# Patient Record
Sex: Female | Born: 1955
Health system: Southern US, Community
[De-identification: ages and names within clinical notes are randomized; demographics above are authoritative.]

## PROBLEM LIST (undated history)

## (undated) DIAGNOSIS — I10 Essential (primary) hypertension: Secondary | ICD-10-CM

## (undated) DIAGNOSIS — I219 Acute myocardial infarction, unspecified: Secondary | ICD-10-CM

## (undated) DIAGNOSIS — E785 Hyperlipidemia, unspecified: Secondary | ICD-10-CM

## (undated) DIAGNOSIS — I2510 Atherosclerotic heart disease of native coronary artery without angina pectoris: Principal | ICD-10-CM

## (undated) DIAGNOSIS — C801 Malignant (primary) neoplasm, unspecified: Secondary | ICD-10-CM

## (undated) DIAGNOSIS — F419 Anxiety disorder, unspecified: Secondary | ICD-10-CM

## (undated) DIAGNOSIS — Z87442 Personal history of urinary calculi: Secondary | ICD-10-CM

## (undated) DIAGNOSIS — F329 Major depressive disorder, single episode, unspecified: Secondary | ICD-10-CM

## (undated) DIAGNOSIS — M797 Fibromyalgia: Secondary | ICD-10-CM

## (undated) DIAGNOSIS — Z9289 Personal history of other medical treatment: Secondary | ICD-10-CM

## (undated) DIAGNOSIS — R002 Palpitations: Secondary | ICD-10-CM

## (undated) DIAGNOSIS — F32A Depression, unspecified: Secondary | ICD-10-CM

## (undated) DIAGNOSIS — G4730 Sleep apnea, unspecified: Secondary | ICD-10-CM

## (undated) DIAGNOSIS — I214 Non-ST elevation (NSTEMI) myocardial infarction: Secondary | ICD-10-CM

## (undated) DIAGNOSIS — J8410 Pulmonary fibrosis, unspecified: Secondary | ICD-10-CM

## (undated) DIAGNOSIS — M1990 Unspecified osteoarthritis, unspecified site: Secondary | ICD-10-CM

## (undated) DIAGNOSIS — E039 Hypothyroidism, unspecified: Secondary | ICD-10-CM

## (undated) DIAGNOSIS — K219 Gastro-esophageal reflux disease without esophagitis: Secondary | ICD-10-CM

## (undated) HISTORY — PX: CARDIAC CATHETERIZATION: SHX172

## (undated) HISTORY — DX: Hyperlipidemia, unspecified: E78.5

## (undated) HISTORY — PX: FOOT BONE EXCISION: SUR493

## (undated) HISTORY — DX: Essential (primary) hypertension: I10

## (undated) HISTORY — PX: TUBAL LIGATION: SHX77

## (undated) HISTORY — PX: CARPAL TUNNEL RELEASE: SHX101

---

## 2001-11-28 ENCOUNTER — Inpatient Hospital Stay (HOSPITAL_COMMUNITY): Admission: EM | Admit: 2001-11-28 | Discharge: 2001-11-30 | Payer: Self-pay | Admitting: Internal Medicine

## 2001-11-28 ENCOUNTER — Encounter: Payer: Self-pay | Admitting: Internal Medicine

## 2001-11-30 ENCOUNTER — Encounter: Payer: Self-pay | Admitting: Cardiology

## 2001-12-29 ENCOUNTER — Other Ambulatory Visit: Admission: RE | Admit: 2001-12-29 | Discharge: 2001-12-29 | Payer: Self-pay | Admitting: Obstetrics and Gynecology

## 2001-12-31 ENCOUNTER — Encounter: Payer: Self-pay | Admitting: Obstetrics and Gynecology

## 2001-12-31 ENCOUNTER — Ambulatory Visit (HOSPITAL_COMMUNITY): Admission: RE | Admit: 2001-12-31 | Discharge: 2001-12-31 | Payer: Self-pay | Admitting: Obstetrics and Gynecology

## 2002-01-24 ENCOUNTER — Encounter (INDEPENDENT_AMBULATORY_CARE_PROVIDER_SITE_OTHER): Payer: Self-pay

## 2002-01-24 ENCOUNTER — Ambulatory Visit (HOSPITAL_COMMUNITY): Admission: RE | Admit: 2002-01-24 | Discharge: 2002-01-24 | Payer: Self-pay | Admitting: Obstetrics and Gynecology

## 2002-07-20 ENCOUNTER — Encounter: Payer: Self-pay | Admitting: Emergency Medicine

## 2002-07-20 ENCOUNTER — Emergency Department (HOSPITAL_COMMUNITY): Admission: EM | Admit: 2002-07-20 | Discharge: 2002-07-20 | Payer: Self-pay | Admitting: Emergency Medicine

## 2003-06-25 ENCOUNTER — Emergency Department (HOSPITAL_COMMUNITY): Admission: EM | Admit: 2003-06-25 | Discharge: 2003-06-25 | Payer: Self-pay | Admitting: Emergency Medicine

## 2003-08-05 DIAGNOSIS — I214 Non-ST elevation (NSTEMI) myocardial infarction: Secondary | ICD-10-CM

## 2003-08-05 DIAGNOSIS — Z9289 Personal history of other medical treatment: Secondary | ICD-10-CM

## 2003-08-05 HISTORY — DX: Non-ST elevation (NSTEMI) myocardial infarction: I21.4

## 2003-08-05 HISTORY — DX: Personal history of other medical treatment: Z92.89

## 2003-08-05 HISTORY — PX: CORONARY ANGIOPLASTY WITH STENT PLACEMENT: SHX49

## 2003-10-27 ENCOUNTER — Inpatient Hospital Stay (HOSPITAL_COMMUNITY): Admission: EM | Admit: 2003-10-27 | Discharge: 2003-11-01 | Payer: Self-pay | Admitting: Emergency Medicine

## 2004-01-31 ENCOUNTER — Inpatient Hospital Stay (HOSPITAL_COMMUNITY): Admission: EM | Admit: 2004-01-31 | Discharge: 2004-02-07 | Payer: Self-pay | Admitting: Cardiology

## 2004-02-07 ENCOUNTER — Encounter: Payer: Self-pay | Admitting: Cardiology

## 2004-04-11 ENCOUNTER — Inpatient Hospital Stay (HOSPITAL_COMMUNITY): Admission: EM | Admit: 2004-04-11 | Discharge: 2004-04-13 | Payer: Self-pay | Admitting: Internal Medicine

## 2004-08-19 ENCOUNTER — Ambulatory Visit: Payer: Self-pay | Admitting: Cardiology

## 2004-10-15 ENCOUNTER — Ambulatory Visit: Payer: Self-pay | Admitting: Cardiology

## 2004-12-23 ENCOUNTER — Ambulatory Visit: Payer: Self-pay | Admitting: Cardiology

## 2004-12-23 ENCOUNTER — Inpatient Hospital Stay (HOSPITAL_COMMUNITY): Admission: AD | Admit: 2004-12-23 | Discharge: 2004-12-25 | Payer: Self-pay | Admitting: Cardiology

## 2004-12-23 ENCOUNTER — Ambulatory Visit: Payer: Self-pay | Admitting: *Deleted

## 2005-01-16 ENCOUNTER — Ambulatory Visit: Payer: Self-pay | Admitting: Cardiology

## 2005-04-22 ENCOUNTER — Ambulatory Visit: Payer: Self-pay | Admitting: *Deleted

## 2005-04-23 ENCOUNTER — Inpatient Hospital Stay (HOSPITAL_COMMUNITY): Admission: AD | Admit: 2005-04-23 | Discharge: 2005-04-25 | Payer: Self-pay | Admitting: Internal Medicine

## 2005-04-23 ENCOUNTER — Encounter: Payer: Self-pay | Admitting: *Deleted

## 2005-04-24 ENCOUNTER — Ambulatory Visit: Payer: Self-pay | Admitting: Cardiology

## 2005-08-26 ENCOUNTER — Ambulatory Visit: Payer: Self-pay | Admitting: Cardiology

## 2005-08-28 ENCOUNTER — Ambulatory Visit (HOSPITAL_COMMUNITY): Admission: RE | Admit: 2005-08-28 | Discharge: 2005-08-28 | Payer: Self-pay | Admitting: Family Medicine

## 2005-09-03 ENCOUNTER — Emergency Department (HOSPITAL_COMMUNITY): Admission: EM | Admit: 2005-09-03 | Discharge: 2005-09-03 | Payer: Self-pay | Admitting: Emergency Medicine

## 2005-09-06 ENCOUNTER — Emergency Department (HOSPITAL_COMMUNITY): Admission: EM | Admit: 2005-09-06 | Discharge: 2005-09-06 | Payer: Self-pay | Admitting: Emergency Medicine

## 2005-12-03 ENCOUNTER — Ambulatory Visit (HOSPITAL_COMMUNITY): Admission: RE | Admit: 2005-12-03 | Discharge: 2005-12-03 | Payer: Self-pay | Admitting: Family Medicine

## 2006-01-02 ENCOUNTER — Ambulatory Visit (HOSPITAL_COMMUNITY): Admission: RE | Admit: 2006-01-02 | Discharge: 2006-01-02 | Payer: Self-pay | Admitting: Internal Medicine

## 2006-01-02 ENCOUNTER — Ambulatory Visit: Payer: Self-pay | Admitting: Internal Medicine

## 2006-01-07 ENCOUNTER — Ambulatory Visit: Payer: Self-pay | Admitting: Orthopedic Surgery

## 2006-01-16 ENCOUNTER — Ambulatory Visit (HOSPITAL_COMMUNITY): Admission: RE | Admit: 2006-01-16 | Discharge: 2006-01-16 | Payer: Self-pay | Admitting: Orthopedic Surgery

## 2006-01-19 ENCOUNTER — Ambulatory Visit: Payer: Self-pay | Admitting: Orthopedic Surgery

## 2006-01-28 ENCOUNTER — Ambulatory Visit: Payer: Self-pay | Admitting: Orthopedic Surgery

## 2006-02-18 ENCOUNTER — Ambulatory Visit: Payer: Self-pay | Admitting: Orthopedic Surgery

## 2006-04-23 ENCOUNTER — Observation Stay (HOSPITAL_COMMUNITY): Admission: EM | Admit: 2006-04-23 | Discharge: 2006-04-23 | Payer: Self-pay | Admitting: Cardiology

## 2006-04-23 ENCOUNTER — Ambulatory Visit: Payer: Self-pay | Admitting: *Deleted

## 2006-05-11 ENCOUNTER — Ambulatory Visit: Payer: Self-pay | Admitting: Cardiology

## 2006-10-24 ENCOUNTER — Emergency Department (HOSPITAL_COMMUNITY): Admission: EM | Admit: 2006-10-24 | Discharge: 2006-10-24 | Payer: Self-pay | Admitting: Emergency Medicine

## 2006-11-25 ENCOUNTER — Ambulatory Visit (HOSPITAL_COMMUNITY): Admission: RE | Admit: 2006-11-25 | Discharge: 2006-11-25 | Payer: Self-pay | Admitting: Family Medicine

## 2006-11-30 ENCOUNTER — Ambulatory Visit: Admission: RE | Admit: 2006-11-30 | Discharge: 2006-11-30 | Payer: Self-pay | Admitting: Family Medicine

## 2006-12-10 ENCOUNTER — Ambulatory Visit: Payer: Self-pay | Admitting: Pulmonary Disease

## 2006-12-24 ENCOUNTER — Ambulatory Visit: Payer: Self-pay | Admitting: Physician Assistant

## 2007-02-02 ENCOUNTER — Ambulatory Visit (HOSPITAL_COMMUNITY): Admission: RE | Admit: 2007-02-02 | Discharge: 2007-02-02 | Payer: Self-pay | Admitting: Family Medicine

## 2007-03-03 ENCOUNTER — Ambulatory Visit: Payer: Self-pay | Admitting: Physician Assistant

## 2007-03-09 ENCOUNTER — Ambulatory Visit (HOSPITAL_COMMUNITY): Admission: RE | Admit: 2007-03-09 | Discharge: 2007-03-09 | Payer: Self-pay | Admitting: Family Medicine

## 2007-05-19 ENCOUNTER — Emergency Department (HOSPITAL_COMMUNITY): Admission: EM | Admit: 2007-05-19 | Discharge: 2007-05-19 | Payer: Self-pay | Admitting: Emergency Medicine

## 2007-08-06 ENCOUNTER — Ambulatory Visit: Payer: Self-pay | Admitting: Cardiology

## 2007-08-06 ENCOUNTER — Encounter: Payer: Self-pay | Admitting: Cardiology

## 2007-08-07 ENCOUNTER — Encounter: Payer: Self-pay | Admitting: Cardiology

## 2007-08-08 ENCOUNTER — Inpatient Hospital Stay (HOSPITAL_COMMUNITY): Admission: AD | Admit: 2007-08-08 | Discharge: 2007-08-10 | Payer: Self-pay | Admitting: Cardiology

## 2007-08-08 ENCOUNTER — Ambulatory Visit: Payer: Self-pay | Admitting: Cardiology

## 2007-11-02 ENCOUNTER — Ambulatory Visit (HOSPITAL_BASED_OUTPATIENT_CLINIC_OR_DEPARTMENT_OTHER): Admission: RE | Admit: 2007-11-02 | Discharge: 2007-11-02 | Payer: Self-pay | Admitting: Orthopedic Surgery

## 2007-12-21 ENCOUNTER — Ambulatory Visit: Payer: Self-pay | Admitting: Cardiology

## 2008-01-11 ENCOUNTER — Encounter: Payer: Self-pay | Admitting: Cardiology

## 2008-02-08 ENCOUNTER — Emergency Department (HOSPITAL_COMMUNITY): Admission: EM | Admit: 2008-02-08 | Discharge: 2008-02-09 | Payer: Self-pay | Admitting: Emergency Medicine

## 2008-02-18 ENCOUNTER — Ambulatory Visit (HOSPITAL_COMMUNITY): Admission: RE | Admit: 2008-02-18 | Discharge: 2008-02-18 | Payer: Self-pay | Admitting: Family Medicine

## 2008-09-07 ENCOUNTER — Emergency Department (HOSPITAL_COMMUNITY): Admission: EM | Admit: 2008-09-07 | Discharge: 2008-09-07 | Payer: Self-pay | Admitting: Emergency Medicine

## 2009-01-19 ENCOUNTER — Encounter: Payer: Self-pay | Admitting: Cardiology

## 2009-03-08 ENCOUNTER — Ambulatory Visit: Payer: Self-pay | Admitting: Cardiology

## 2009-03-08 ENCOUNTER — Encounter: Payer: Self-pay | Admitting: Cardiology

## 2009-03-08 DIAGNOSIS — E039 Hypothyroidism, unspecified: Secondary | ICD-10-CM | POA: Insufficient documentation

## 2009-03-08 DIAGNOSIS — E782 Mixed hyperlipidemia: Secondary | ICD-10-CM | POA: Insufficient documentation

## 2009-03-08 DIAGNOSIS — I2510 Atherosclerotic heart disease of native coronary artery without angina pectoris: Secondary | ICD-10-CM | POA: Insufficient documentation

## 2009-06-04 ENCOUNTER — Encounter (INDEPENDENT_AMBULATORY_CARE_PROVIDER_SITE_OTHER): Payer: Self-pay | Admitting: *Deleted

## 2009-07-04 ENCOUNTER — Encounter: Payer: Self-pay | Admitting: Cardiology

## 2009-07-05 ENCOUNTER — Encounter (INDEPENDENT_AMBULATORY_CARE_PROVIDER_SITE_OTHER): Payer: Self-pay | Admitting: *Deleted

## 2009-07-26 ENCOUNTER — Telehealth (INDEPENDENT_AMBULATORY_CARE_PROVIDER_SITE_OTHER): Payer: Self-pay | Admitting: *Deleted

## 2009-09-20 DIAGNOSIS — Z87891 Personal history of nicotine dependence: Secondary | ICD-10-CM | POA: Insufficient documentation

## 2009-09-20 DIAGNOSIS — I214 Non-ST elevation (NSTEMI) myocardial infarction: Secondary | ICD-10-CM | POA: Insufficient documentation

## 2009-09-20 DIAGNOSIS — R072 Precordial pain: Secondary | ICD-10-CM | POA: Insufficient documentation

## 2009-09-20 DIAGNOSIS — E785 Hyperlipidemia, unspecified: Secondary | ICD-10-CM | POA: Insufficient documentation

## 2009-09-20 DIAGNOSIS — Z9861 Coronary angioplasty status: Secondary | ICD-10-CM | POA: Insufficient documentation

## 2009-11-02 ENCOUNTER — Encounter: Payer: Self-pay | Admitting: Cardiology

## 2010-02-08 ENCOUNTER — Ambulatory Visit: Payer: Self-pay | Admitting: Cardiology

## 2010-02-08 DIAGNOSIS — I10 Essential (primary) hypertension: Secondary | ICD-10-CM | POA: Insufficient documentation

## 2010-02-08 DIAGNOSIS — F17200 Nicotine dependence, unspecified, uncomplicated: Secondary | ICD-10-CM | POA: Insufficient documentation

## 2010-08-09 ENCOUNTER — Telehealth (INDEPENDENT_AMBULATORY_CARE_PROVIDER_SITE_OTHER): Payer: Self-pay | Admitting: *Deleted

## 2010-08-12 ENCOUNTER — Ambulatory Visit
Admission: RE | Admit: 2010-08-12 | Discharge: 2010-08-12 | Payer: Self-pay | Source: Home / Self Care | Attending: Cardiology | Admitting: Cardiology

## 2010-08-12 ENCOUNTER — Encounter: Payer: Self-pay | Admitting: Cardiology

## 2010-08-25 ENCOUNTER — Encounter: Payer: Self-pay | Admitting: Family Medicine

## 2010-09-03 NOTE — Assessment & Plan Note (Signed)
Summary: 1 YR FU   Visit Type:  Follow-up Primary Provider:  Dr. [name]   History of Present Illness: 55 year old woman presents for followup. She was last seen in the office back in May 2009. Interval history is reviewed including a repeat cardiac catheterization from August of last year demonstrating patent stents and otherwise nonobstructive disease.  From a symptom perspective, [name] describes occasional chest pain, although not typically with exertion, and not progressive. She states that she remains active, taking care of grandchildren, and working in her garden.  Followup labs in April of this year by Dr. Gerda Diss showed BUN 12, creatinine 0.8, AST 21, ALT 23, cholesterol 132, HDL 47, LDL 71, triglycerides 68. We discussed these.  Today we talked about smoking cessation. She has been able to cut back cigarettes, but not quit.  Preventive Screening-Counseling & Management  Alcohol-Tobacco     Smoking Status: current     Smoking Cessation Counseling: yes     Packs/Day: <1/3 PPD  Current Medications (verified): 1)  Imdur 30 Mg Xr24h-Tab (Isosorbide Mononitrate) .... Take 1/2 Tablet By Mouth Once A Day 2)  Metoprolol Tartrate 50 Mg Tabs (Metoprolol Tartrate) .... Take 1/2 Tablet By Mouth Twice A Day 3)  Plavix 75 Mg Tabs (Clopidogrel Bisulfate) .... Take 1 Tablet By Mouth Once A Day 4)  Omeprazole 20 Mg Cpdr (Omeprazole) .... Take 1 Tablet By Mouth Once A Day 5)  Nitroglycerin 0.4 Mg Subl (Nitroglycerin) .... One Tablet Under Tongue Every 5 Minutes As Needed For Chest Pain---May Repeat Times Three 6)  Aspir-Low 81 Mg Tbec (Aspirin) .... Take 1 Tablet By Mouth Once A Day 7)  Lipitor 80 Mg Tabs (Atorvastatin Calcium) .... Take One Tablet By Mouth Daily. 8)  Prozac 40 Mg Caps (Fluoxetine Hcl) .... Take 1 Capsule By Mouth Once A Day 9)  Synthroid 125 Mcg Tabs (Levothyroxine Sodium) .... Take 1 Tablet By Mouth Once A Day 10)  Alprazolam 1 Mg Tabs (Alprazolam) .... Take 1  Tablet By Mouth Four Times A Day 11)  Hydrocodone-Acetaminophen 10-500 Mg Tabs (Hydrocodone-Acetaminophen) .... Take 1/2-1 Tablet By Mouth Two Times A Day As Needed  Allergies (verified): No Known Drug Allergies  Comments:  Nurse/Medical Assistant: The patient's medication bottles and allergies were reviewed with the patient and were updated in the Medication and Allergy Lists.  Past History:  Social History: Last updated: 02/08/2010 Disabled  Divorced  Tobacco Use - Former Alcohol Use - no Drug Use - no  Past Medical History: CAD - nonobstructive circ/RCA, DES LAD/diagonal 2005, LVEF 60% G E R D Hyperlipidemia Hypertension Hypothyroidism Myocardial Infarction - NSTEMI 2009 Fibromyalgia Depression - h/o suicide attempt  Social History: Disabled  Divorced  Tobacco Use - Former Alcohol Use - no Drug Use - no Smoking Status:  current Packs/Day:  <1/3 PPD  Review of Systems       The patient complains of peripheral edema.  The patient denies anorexia, fever, syncope, dyspnea on exertion, prolonged cough, hemoptysis, melena, hematochezia, and severe indigestion/heartburn.         Otherwise reviewed and negative except as outlined.  Clinical Review Panels:  Cardiac Imaging Cardiac Cath Findings  ANGIOGRAPHIC DATA.:   1. The left main coronary artery is free of critical disease.   2. The LAD courses to the apex.  Importantly, the LAD divides, and       there are two small thin branches which constitute the LAD.  One       arises from the  purported diagonal and the other one from the true       LAD which occupies to some extent a septal perforating location.       There is a bifurcation stent at the takeoff of the large diagonal,       and the LAD despite the bifurcation stent there does not appear to       be significant narrowing.  The stents themselves appear to be       widely patent.  One branch provides the LAD proper which is small       and has a small  septal perforator which has some ostial pinching at       its takeoff from the stent site, but it is relatively small.  A       large diagonal itself has a LAD portion, and supplies another side       branch, all of which are free of critical disease.   3. The circumflex has a first marginal that has 30% proximal       narrowing.  It does not appear to be high-grade.  The remainder of       the circumflex provides two large marginal branches which are free       of critical disease.  There is perhaps 30% narrowing in the mid       portion of the AV circumflex and mild luminal irregularity       thereafter, but no critical focal stenoses.   4. The right coronary artery is a large-caliber vessel.  There is       about 20-30% ostial narrowing and 20% mid-narrowing but no evidence       of high-grade stenosis.      Ventriculography was done in the RAO projection.  There was really only   one good ventricular beat, but overall LV function appeared to be   preserved.      CONCLUSIONS:   1. Preserved overall LV function without segmental wall motion       abnormality that is definable.   2. Continued patency of the bifurcation stents and patency of the       other coronary vessels.  The patient will likely need medical       therapy. (03/08/2009)    Vital Signs:  Patient profile:   55 year old female Height:      66 inches Weight:      208 pounds BMI:     33.69 Pulse rate:   52 / minute BP sitting:   109 / 74  (left arm) Cuff size:   large  Vitals Entered By: Carlye Grippe (February 08, 2010 10:09 AM)  Nutrition Counseling: Patient's BMI is greater than 25 and therefore counseled on weight management options.  Physical Exam  Additional Exam:  Comfortable in no acute distress. HEENT: Conjuctivae and lids normal, oropharynx clear with moist mucosa. Neck: Supple, no elevated JVP, no loud carotid bruits, no thyromegaly or tenderness. Lungs: Nonlabored breathing at rest. CTA without  rales or wheezes. Cor: PMI nondisplaced. RRR, normal S1/S2. No pathologic systolic murmurs. No S3 or rub. Abd: Soft, NTND.  No HSM. No bruits. Normoactive bowel sounds. Ext: No CCE. Distal pulses 2+. MS: No kyphosis noted. Skin: Large tattoo on back. Neuropsych: A&Ox3, affect grossly normal.    EKG  Procedure date:  02/08/2010  Findings:      Sinus bradycardia at 48 beats per minute, otherwise normal.  Impression & Recommendations:  Problem # 1:  CORONARY ATHEROSCLEROSIS NATIVE CORONARY ARTERY (ICD-414.01)  Symptomatically stable on present medical regimen. Electrocardiogram is also stable. She continues to follow regularly with Dr.Luking, and we will plan an annual followup, sooner if needed.  Her updated medication list for this problem includes:    Imdur 30 Mg Xr24h-tab (Isosorbide mononitrate) .Marland Kitchen... Take 1/2 tablet by mouth once a day    Metoprolol Tartrate 50 Mg Tabs (Metoprolol tartrate) .Marland Kitchen... Take 1/2 tablet by mouth twice a day    Plavix 75 Mg Tabs (Clopidogrel bisulfate) .Marland Kitchen... Take 1 tablet by mouth once a day    Nitroglycerin 0.4 Mg Subl (Nitroglycerin) ..... One tablet under tongue every 5 minutes as needed for chest pain---may repeat times three    Aspir-low 81 Mg Tbec (Aspirin) .Marland Kitchen... Take 1 tablet by mouth once a day  Orders: EKG w/ Interpretation (93000)  Problem # 2:  HYPERLIPIDEMIA (ICD-272.4)  Well-controlled based on labs from April.  Her updated medication list for this problem includes:    Lipitor 80 Mg Tabs (Atorvastatin calcium) .Marland Kitchen... Take one tablet by mouth daily.  Problem # 3:  TOBACCO ABUSE (ICD-305.1)  We discussed smoking cessation today.  Problem # 4:  ESSENTIAL HYPERTENSION, BENIGN (ICD-401.1)  Blood pressure well controlled today.  Her updated medication list for this problem includes:    Metoprolol Tartrate 50 Mg Tabs (Metoprolol tartrate) .Marland Kitchen... Take 1/2 tablet by mouth twice a day    Aspir-low 81 Mg Tbec (Aspirin) .Marland Kitchen... Take 1 tablet  by mouth once a day  Patient Instructions: 1)  Your physician wants you to follow-up in: 1 year. You will receive a reminder letter in the mail one-two months in advance. If you don't receive a letter, please call our office to schedule the follow-up appointment. 2)  Your physician recommends that you continue on your current medications as directed. Please refer to the Current Medication list given to you today. Prescriptions: PLAVIX 75 MG TABS (CLOPIDOGREL BISULFATE) Take 1 tablet by mouth once a day  #30 x 12   Entered by:   Cyril Loosen, RN, BSN   Authorized by:   Loreli Slot, MD, [facility]   Signed by:   Cyril Loosen, RN, BSN on 02/08/2010   Method used:   Electronically to        [facility]* (retail)       [address]       [place], [place]  [address]       Ph: [phone]       Fax: [phone]   RxID:   [id number] IMDUR 30 MG XR24H-TAB (ISOSORBIDE MONONITRATE) Take 1/2 tablet by mouth once a day  #15 x 12   Entered by:   Cyril Loosen, RN, BSN   Authorized by:   Loreli Slot, MD, [facility]   Signed by:   Cyril Loosen, RN, BSN on 02/08/2010   Method used:   Electronically to        [facility]* (retail)       [address]       [place], [place]  [address]       Ph: [phone]       Fax: [phone]   RxID:   [id number]

## 2010-09-03 NOTE — Letter (Signed)
Summary: Engineer, materials at [facility]  [address]   [place], [place] [address]   Phone: [phone]  Fax: [phone]        July 05, 2009 MRN: [id number]    [name] [address] [place], [place]  [address]    Dear [name],  Your test ordered by Selena Batten has been reviewed by your physician (or physician assistant) and was found to be normal or stable. Your physician (or physician assistant) felt no changes were needed at this time.  ____ Echocardiogram  ____ Cardiac Stress Test  __X__ Lab Work-Per Dr. Diona Browner, Lipids look good.  LFT's (Liver) OK.  Please forward TSH to primary MD.-PLEASE FOLLOW UP WITH YOUR PRIMARY MD REGARDING YOUR THYROID HORMONE LEVEL.  ____ Peripheral vascular study of arms, legs or neck  ____ CT scan or X-ray  ____ Lung or Breathing test  ____ Other:   Thank you.   Cyril Loosen, RN, BSN    Duane Boston, M.D., F.A.C.C. Thressa Sheller, M.D., F.A.C.C. Oneal Grout, M.D., F.A.C.C. Cheree Ditto, M.D., F.A.C.C. Daiva Nakayama, M.D., F.A.C.C. Kenney Houseman, M.D., F.A.C.C. Jeanne Ivan, PA-C

## 2010-09-03 NOTE — Miscellaneous (Signed)
Summary: rx - ntg spray  Clinical Lists Changes  Medications: Added new medication of NITROLINGUAL 0.4 MG/SPRAY SOLN (NITROGLYCERIN) one spray under tongue every 5 minutes as needed for severe chest pain, not to exceed 3 sprays in 15 min time frame - Signed Rx of NITROLINGUAL 0.4 MG/SPRAY SOLN (NITROGLYCERIN) one spray under tongue every 5 minutes as needed for severe chest pain, not to exceed 3 sprays in 15 min time frame;  #1 x 2;  Signed;  Entered by: Hoover Brunette, LPN;  Authorized by: Loreli Slot, MD, [facility];  Method used: Electronically to [facility] [address] [place], [place], [place]  [address], Ph: [phone], Fax: [phone]    Prescriptions: NITROLINGUAL 0.4 MG/SPRAY SOLN (NITROGLYCERIN) one spray under tongue every 5 minutes as needed for severe chest pain, not to exceed 3 sprays in 15 min time frame  #1 x 2   Entered by:   Hoover Brunette, LPN   Authorized by:   Loreli Slot, MD, [facility]   Signed by:   Hoover Brunette, LPN on 01/77/9390   Method used:   Electronically to        [facility]* (retail)       [address]       [place], [place]  [address]       Ph: [phone]       Fax: [phone]   RxID:   [id number]

## 2010-09-03 NOTE — Progress Notes (Signed)
Summary: Chest pain  Phone Note Call from Patient Call back at Home Phone [phone]   Summary of Call: Spoke with patient via phone today. She states she woke up with chest pain/pressure last night. She took one NTG and drifted back to sleep. She woke up again and still had pain so she took another NTG and again drifted off to sleep. She states when she woke up pain was gone, other than a "NTG headache." Pt denies SOB or other associated symptoms. Pt states she feels fine today but wanted [place] to be aware of her chest pain. She has a reminder in for February. Appt scheduled for September 20, 2009. Pt is aware to go to the ER for worsening or continued chest pain. She is also aware to contact office if she has further problems before 2/17 office visit. Pt verbalized understanding.  Initial call taken by: Cyril Loosen, RN, BSN,  July 26, 2009 10:48 AM

## 2010-09-03 NOTE — Assessment & Plan Note (Signed)
Summary: [facility]   Visit Type:  Follow-up Primary [name]:  [name], MD   History of Present Illness: [name] returns for a followup visit, having missed her last 6 month visit with [place]. From a symptom perspective, she reports doing very well without any significant angina or breathlessness beyond NYHA class II. She reports tolerating her medicines well, and has not required any sublingual nitroglycerin. She still enjoys riding her motorcycle, and states she's been walking with her dogs later in the evenings when it is cooler outside. She is due for followup labs. She reports no hospitalizations since I last saw her.  Preventive Screening-Counseling & Management  Alcohol-Tobacco     Smoking Status: quit      Drug Use:  no.    Current Medications (verified): 1)  Imdur 30 Mg Xr24h-Tab (Isosorbide Mononitrate) .... Take 1/2 Tablet By Mouth Once A Day 2)  Metoprolol Tartrate 50 Mg Tabs (Metoprolol Tartrate) .... Take 1/2 Tablet By Mouth Twice A Day 3)  Plavix 75 Mg Tabs (Clopidogrel Bisulfate) .... Take 1 Tablet By Mouth Once A Day 4)  Prilosec 40 Mg Cpdr (Omeprazole) .... Take 1 Capsule By Mouth Once A Day 5)  Nitroglycerin 0.4 Mg Subl (Nitroglycerin) .... One Tablet Under Tongue Every 5 Minutes As Needed For Chest Pain---May Repeat Times Three 6)  Aspirin Ec 325 Mg Tbec (Aspirin) .... Take One Tablet By Mouth Daily 7)  Lipitor 80 Mg Tabs (Atorvastatin Calcium) .... Take One Tablet By Mouth Daily. 8)  Prozac 40 Mg Caps (Fluoxetine Hcl) .... Take 1 Capsule By Mouth Once A Day 9)  Synthroid 125 Mcg Tabs (Levothyroxine Sodium) .... Take 1 Tablet By Mouth Once A Day 10)  Xanax 0.25 Mg Tabs (Alprazolam) .... As Needed 11)  Hydrocodone-Acetaminophen 5-500 Mg Tabs (Hydrocodone-Acetaminophen) .... As Needed  Allergies (verified): No Known Drug Allergies  Past History:  Past Medical History: CAD G E R D Hyperlipidemia Hypertension Hypothyroidism Myocardial  Infarction Fibromyalgia History of depression  Family History: Father: myocardial infarction at age 33 Mother: deceased age 76 with an intestinal obstruction Siblings: one brother with history of congenital heart disease  Social History: Disabled  Divorced  Tobacco Use - Former.  Alcohol Use - no Drug Use - no Smoking Status:  quit Drug Use:  no  Review of Systems  The patient denies fever, weight loss, chest pain, syncope, peripheral edema, melena, and hematochezia.         She has occasional, very brief palpitations. No dizziness. Otherwise systems reviewed and negative.  Vital Signs:  Patient profile:   55 year old female Weight:      220 pounds Pulse rate:   51 / minute BP sitting:   93 / 64  (left arm)  Physical Exam  Additional Exam:  Comfortable in no acute distress. HEENT: Conjuctivae and lids normal, oropharynx clear with moist mucosa. Neck: Supple, no elevated JVP, no loud carotid bruits, no thyromegaly or tenderness. Lungs: Nonlabored breathing at rest. CTA without rales or wheezes. Cor: PMI nondisplaced. RRR, normal S1/S2. No pathologic systolic murmurs. No S3 or rub. Abd: Soft, NTND.  No HSM. No bruits. Normoactive bowel sounds. Ext: No CCE. Distal pulses 2+. MS: No kyphosis noted. Skin: Large tattoo on back. Neuropsych: A&Ox3, affect grossly normal.    EKG  Procedure date:  03/08/2009  Findings:      Sinus bradycardia at 52 beats per minute.  Cardiac Cath  Procedure date:  03/08/2009  Findings:  ANGIOGRAPHIC DATA.:   1. The left main coronary artery is free of critical disease.   2. The LAD courses to the apex.  Importantly, the LAD divides, and       there are two small thin branches which constitute the LAD.  One       arises from the purported diagonal and the other one from the true       LAD which occupies to some extent a septal perforating location.       There is a bifurcation stent at the takeoff of the large diagonal,        and the LAD despite the bifurcation stent there does not appear to       be significant narrowing.  The stents themselves appear to be       widely patent.  One branch provides the LAD proper which is small       and has a small septal perforator which has some ostial pinching at       its takeoff from the stent site, but it is relatively small.  A       large diagonal itself has a LAD portion, and supplies another side       branch, all of which are free of critical disease.   3. The circumflex has a first marginal that has 30% proximal       narrowing.  It does not appear to be high-grade.  The remainder of       the circumflex provides two large marginal branches which are free       of critical disease.  There is perhaps 30% narrowing in the mid       portion of the AV circumflex and mild luminal irregularity       thereafter, but no critical focal stenoses.   4. The right coronary artery is a large-caliber vessel.  There is       about 20-30% ostial narrowing and 20% mid-narrowing but no evidence       of high-grade stenosis.      Ventriculography was done in the RAO projection.  There was really only   one good ventricular beat, but overall LV function appeared to be   preserved.      CONCLUSIONS:   1. Preserved overall LV function without segmental wall motion       abnormality that is definable.   2. Continued patency of the bifurcation stents and patency of the       other coronary vessels.  The patient will likely need medical       therapy.  Echocardiogram  Procedure date:  02/07/2004  Findings:        SUMMARY   -  Left ventricular ejection fraction was estimated , range being 55         % to 60 %..   -  There is hypokinesis of the apex. However, other walls move well,         and the EF is maintained in the 55-60% range. There is no         clot in the apex.   IMPRESSIONS   -  There is hypokinesis of the apex. However, other walls move well,         and the EF  is maintained in the 55-60% range. There is no         clot in the apex.  CXR  Procedure date:  08/06/2007  Findings:  [facility] chest x-ray:  Low lung volumes. Borderline-enlarged cardiopericardial silhouette. No pulmonary edema or focal air space consolidation. Diffuse interstitial coarsening with chronic features. Chronic atelectasis versus scarring in both lung bases, stable.  Impression & Recommendations:  Problem # 1:  CORONARY ATHEROSCLEROSIS NATIVE CORONARY ARTERY (ICD-414.01)  Overall stable on medical therapy without any obvious recurrent, progressive angina. Will plan to continue present medical regimen, and have her return for a 6 month followup visit  Her updated medication list for this problem includes:    Imdur 30 Mg Xr24h-tab (Isosorbide mononitrate) .Marland Kitchen... Take 1/2 tablet by mouth once a day    Metoprolol Tartrate 50 Mg Tabs (Metoprolol tartrate) .Marland Kitchen... Take 1/2 tablet by mouth twice a day    Plavix 75 Mg Tabs (Clopidogrel bisulfate) .Marland Kitchen... Take 1 tablet by mouth once a day    Nitroglycerin 0.4 Mg Subl (Nitroglycerin) ..... One tablet under tongue every 5 minutes as needed for chest pain---may repeat times three    Aspirin Ec 325 Mg Tbec (Aspirin) .Marland Kitchen... Take one tablet by mouth daily  Problem # 2:  MIXED HYPERLIPIDEMIA (ICD-272.2)  Patient continues on high-dose Lipitor, tolerating this well. She is due for a followup lipid profile and liver function tests, with goal LDL around 70. This will be arranged..  Her updated medication list for this problem includes:    Lipitor 80 Mg Tabs (Atorvastatin calcium) .Marland Kitchen... Take one tablet by mouth daily.  Problem # 3:  UNSPECIFIED HYPOTHYROIDISM (ICD-244.9)  Patient continues on Synthroid, increased by Dr Gerda Diss to 125 micrograms daily. A followup TSH will be added to the above blood work. Results can be forwarded to Dr Gerda Diss.  Her updated medication list for this problem includes:    Synthroid 125 Mcg Tabs (Levothyroxine  sodium) .Marland Kitchen... Take 1 tablet by mouth once a day  Patient Instructions: 1)  Your physician recommends that you have labs at the [facility] when convenient.  [facility]) 2)  Your physician wants you to follow-up in:   6 MONTHS.  You will receive a reminder letter in the mail one month in advance. If you don't receive a letter, please call our office to schedule the follow-up appointment.

## 2010-09-05 ENCOUNTER — Encounter: Payer: Self-pay | Admitting: Cardiology

## 2010-09-05 NOTE — Assessment & Plan Note (Signed)
Summary: palpitations, determine if moniter needed LA  Nurse Visit   Vital Signs:  Patient profile:   55 year old female Weight:      204 pounds  Vitals Entered By: Carlye Grippe (August 12, 2010 8:19 AM)  Visit Type:  nurse visit  CC:  ekg.  CC: ekg Comments patient denies chest pain,dizziness or sob. patient states that she has these palpitations almost everday. MD ordered 7 day monitor. patient notified that cardionet company would contact her r/e placement of monitor.   Preventive Screening-Counseling & Management  Alcohol-Tobacco     Smoking Status: current     Smoking Cessation Counseling: yes     Packs/Day: <1/3 PPD  Comments: smokes occasionally/mostly one cig/day  Allergies: No Known Drug Allergies  Orders Added: 1)  EKG w/ Interpretation [93000] 2)  Cardionet/Event Monitor [Cardionet/Event]

## 2010-09-05 NOTE — Progress Notes (Signed)
Summary: ?PALPITATIONS  Phone Note Call from Patient Call back at [phone]   Caller: Patient Call For: nurse Summary of Call: message left on nurse's voicemail from patient "that I've been having palpitations or something going on with her heart for over a week now". Nurse returned call and patient said that "it feels like her heart is going to jump out of her chest or shaking like a bowl of jello." Patient said she cut out all caffeine and its still doing it. Denies chest pain,dizziness,SOB. Initial call taken by: Carlye Grippe,  August 09, 2010 2:59 PM  Follow-up for Phone Call        I reviewed her last note. At that point there was no description of any sense of palpitations, or for that matter diagnosed dysrhythmias. If she feels as if her heart is truly racing, it may be best for her to get an ECG in the emergency department to make sure that she is not in a rapid arrhythmia such as atrial fibrillation that might require intervention. Otherwise if her symptoms seem more intermittent and she otherwise feels well, we could consider having her come in to have a Holter monitor placed. Follow-up by: Loreli Slot, MD, [facility],  August 09, 2010 3:13 PM  Additional Follow-up for Phone Call Additional follow up Details #1::        patient states she feels fine and would rather have an appointment scheduled. MD states patient can come for nurse visit to determine type and llength of monitor she may need. patient stated that she was unsure exactly of frequency of palpiations but they came more when she was lying down. Nurse visit appt given for Monday. Additional Follow-up by: Carlye Grippe,  August 09, 2010 3:21 PM

## 2010-09-06 ENCOUNTER — Encounter (INDEPENDENT_AMBULATORY_CARE_PROVIDER_SITE_OTHER): Payer: Self-pay | Admitting: *Deleted

## 2010-09-11 ENCOUNTER — Encounter: Payer: Self-pay | Admitting: Cardiology

## 2010-09-11 DIAGNOSIS — I2510 Atherosclerotic heart disease of native coronary artery without angina pectoris: Secondary | ICD-10-CM

## 2010-09-11 NOTE — Procedures (Signed)
Summary: [facility] END OF SUMMARY  [facility] END OF SUMMARY   Imported By: Cyril Loosen, RN, BSN 09/05/2010 10:22:00  _____________________________________________________________________  External Attachment:    Type:   Image     Comment:   External Document  Appended Document: [facility] END OF SUMMARY Reviewed. I would like to see her back in the office to discuss this further. Also please schedule a Lexiscan Cardiolite on medications for ischemic surveillance.  Appended Document: Orders Update Pt notified of results and verbalized understanding. Pt also given verbal instructions for stress test.   Clinical Lists Changes  Orders: Added new Referral order of Nuclear Med (Nuc Med) - Signed

## 2010-09-11 NOTE — Letter (Signed)
Summary: Lexiscan or Dobutamine Pharmacist, community at [facility]  [address]   [place], [place] [address]   Phone: [phone]  Fax: [phone]      [facility] Cardiovascular Services  Lexiscan or Dobutamine Cardiolite Strss Test    [facility]  Appointment Date:_  Appointment Time:_  Your doctor has ordered a CARDIOLITE STRESS TEST using a medication to stimulate exercise so that you will not have to walk on the treadmill to determine the condition of your heart during stress. If you take blood pressure medication, ask your doctor if you should take it the day of your test. You should not have anything to eat or drink at least 4 hours before your test is scheduled, and no caffeine, including decaffeinated tea and coffee, chocolate, and soft drinks for 24 hours before your test.  You will need to register at the Outpatient/Main Entrance at the hospital 15 minutes before your appointment time. It is a good idea to bring a copy of your order with you. They will direct you to the Diagnostic Imaging (Radiology) Department.  You will be asked to undress from the waist up and given a hospital gown to wear, so dress comfortably from the waist down for example: Sweat pants, shorts, or skirt Rubber soled lace up shoes (tennis shoes)  Plan on about three hours from registration to release from the hospital  You may take all of your medications with water the morning of your test.

## 2010-09-30 ENCOUNTER — Telehealth (INDEPENDENT_AMBULATORY_CARE_PROVIDER_SITE_OTHER): Payer: Self-pay | Admitting: *Deleted

## 2010-10-02 ENCOUNTER — Encounter: Payer: Self-pay | Admitting: Physician Assistant

## 2010-10-02 ENCOUNTER — Ambulatory Visit (INDEPENDENT_AMBULATORY_CARE_PROVIDER_SITE_OTHER): Payer: Medicare Other | Admitting: Cardiology

## 2010-10-02 DIAGNOSIS — R002 Palpitations: Secondary | ICD-10-CM | POA: Insufficient documentation

## 2010-10-02 DIAGNOSIS — R0602 Shortness of breath: Secondary | ICD-10-CM

## 2010-10-02 DIAGNOSIS — E782 Mixed hyperlipidemia: Secondary | ICD-10-CM

## 2010-10-07 ENCOUNTER — Encounter: Payer: Self-pay | Admitting: Cardiology

## 2010-10-07 DIAGNOSIS — R0602 Shortness of breath: Secondary | ICD-10-CM

## 2010-10-09 ENCOUNTER — Encounter: Payer: Self-pay | Admitting: Physician Assistant

## 2010-10-09 ENCOUNTER — Ambulatory Visit (INDEPENDENT_AMBULATORY_CARE_PROVIDER_SITE_OTHER): Payer: Medicare Other | Admitting: Cardiology

## 2010-10-09 DIAGNOSIS — I2510 Atherosclerotic heart disease of native coronary artery without angina pectoris: Secondary | ICD-10-CM

## 2010-10-10 NOTE — Progress Notes (Signed)
Summary: Palpitations  Phone Note Call from Patient Call back at [facility] Phone [phone]   Summary of Call: Pt called office with c/o heart skipping beats and racing. Pt states it is flip/floping in her chest. Pt states she is having a horrible weekend. Pt denies chest pain or tightness. she states palps are worse at night. She states after her heart flip/flops for about 10-15 mins she feels chest pressure but no pain. She states she's sweating a lot but that may be menopause.   Pt has f/u with Dr. Diona Browner on 10/09/10.  Stress test results were not received by [facility] These have been retrieved and scanned scanned in for review. Pt is aware to go to ER for worsening or cotinued symptoms. She is also aware note will be sent to him for further review and recommendation. Initial call taken by: Cyril Loosen, RN, BSN,  September 30, 2010 2:05 PM     Appended Document: Palpitations Pt will be seen on Wednesday, February 29th,

## 2010-10-10 NOTE — Assessment & Plan Note (Signed)
Summary: OFFICE VISIT PER DR. MCDOWELL-REVIEW STRESS TEST-JM   Visit Type:  Follow-up Primary Provider:  Dr. [name]   History of Present Illness: patient returns for early followup, in review of recent diagnostic testing. She was last seen here in the clinic, by Dr. Diona Browner, in July 2011.  Patient has developed recent, frequent palpitations, occurring daily and often. She does not experience any significant associated symptoms, however. She was referred her a one week Holter monitor, reviewed by Dr. Diona Browner, yielding frequent PVCs with isolated brief runs, and no atrial fibrillation. She was then scheduled for a stress Cardiolite test, to rule out occult ischemia. This yielded a moderately large anterior scar, but with no active ischemia; EF 48%.  Clinically, she reports that she actually "feels pretty good". She denies any interim development of exertional chest discomfort, and has chronic DOE, which remains unchanged. Regarding the palpitations, these are more bothersome than symptomatic. They occur several times a day, and daily.  Preventive Screening-Counseling & Management  Alcohol-Tobacco     Smoking Status: current     Year Started: 38 yrs  Comments: states off/on - socially now  Current Medications (verified): 1)  Imdur 30 Mg Xr24h-Tab (Isosorbide Mononitrate) .... Take 1/2 Tablet By Mouth Once A Day 2)  Metoprolol Tartrate 50 Mg Tabs (Metoprolol Tartrate) .... Take 1/2 Tablet By Mouth Twice A Day 3)  Plavix 75 Mg Tabs (Clopidogrel Bisulfate) .... Take 1 Tablet By Mouth Once A Day 4)  Pantoprazole Sodium 40 Mg Tbec (Pantoprazole Sodium) .... Take 1 Tablet By Mouth Once A Day 5)  Nitroglycerin 0.4 Mg Subl (Nitroglycerin) .... One Tablet Under Tongue Every 5 Minutes As Needed For Chest Pain---May Repeat Times Three 6)  Aspir-Low 81 Mg Tbec (Aspirin) .... Take 1 Tablet By Mouth Once A Day 7)  Lipitor 80 Mg Tabs (Atorvastatin Calcium) .... Take One Tablet By Mouth Daily. 8)   Prozac 40 Mg Caps (Fluoxetine Hcl) .... Take 1 Capsule By Mouth Once A Day 9)  Synthroid 125 Mcg Tabs (Levothyroxine Sodium) .... Take 1 Tablet By Mouth Once A Day 10)  Alprazolam 1 Mg Tabs (Alprazolam) .... Take 1 Tablet By Mouth Four Times A Day As Needed 11)  Hydrocodone-Acetaminophen 10-500 Mg Tabs (Hydrocodone-Acetaminophen) .... Take 1/2-1 Tablet By Mouth Two Times A Day As Needed  Allergies (verified): No Known Drug Allergies  Past History:  Past Medical History: Last updated: 02/08/2010 CAD - nonobstructive circ/RCA, DES LAD/diagonal 2005, LVEF 60% G E R D Hyperlipidemia Hypertension Hypothyroidism Myocardial Infarction - NSTEMI 2009 Fibromyalgia Depression - h/o suicide attempt  Review of Systems       No fevers, chills, hemoptysis, dysphagia, melena, hematocheezia, hematuria, rash, claudication, orthopnea, pnd, pedal edema. All other systems negative.   Vital Signs:  Patient profile:   55 year old female Weight:      215.25 pounds Pulse rate:   72 / minute BP sitting:   100 / 66  (left arm) Cuff size:   regular  Vitals Entered By: Hoover Brunette, LPN (October 02, 2010 1:14 PM) Is Patient Diabetic? No Comments f/u on stress test   Physical Exam  Additional Exam:  GEN: 55 year old female, obese, no distress HEENT: NCAT,PERRLA,EOMI NECK: palpable pulses, no bruits; no JVD; no TM LUNGS: CTA bilaterally HEART: RRR (S1S2); no significant murmurs; no rubs; no gallops ABD: soft, NT; intact BS EXT: intact distal pulses; no edema SKIN: warm, dry MUSC: no obvious deformity NEURO: A/O (x3)      Impression & Recommendations:  Problem # 1:  PALPITATIONS (ICD-785.1)  recent Holter monitor yielded frequent PVCs, with occasional brief runs of NSVT, but no atrial fibrillation. Will increase Lopressor to 50 mg b.i.d. (with parameters for BP/HR), in an attempt to better suppress this frequent ventricular ectopy. Will also request labs from Dr. Fletcher Anon office for  review, which include a scheduled TSH level. Will also order a 2-D echo for reassessment of LVEF (history of EF 60% by previous catheterizations; 48%, by recent Cardiolite). Will schedule early follow up in 2 weeks, for review of study results and further recommendations.  Problem # 2:  CORONARY ATHEROSCLEROSIS NATIVE CORONARY ARTERY (ICD-414.01)  patient denies any symptoms suggestive of active ischemia. A recent stress test yielded a moderately large anterior scar, but with no definite ischemia; EF 48%. We'll continue to monitor closely, but did discuss the possibility of having to repeat a heart catheterization, if needed.  Problem # 3:  TOBACCO ABUSE (ICD-305.1)  discussed the importance of smoking cessation. Patient acknowledges that she is trying to cut back, and is smoking only on a social basis.  Problem # 4:  ESSENTIAL HYPERTENSION, BENIGN (ICD-401.1)  well-controlled on current medication regimen. Patient is instructed to monitor her BP, given the recommended increase in beta blocker, given her low normal readings.  Other Orders: 2-D Echocardiogram (2D Echo)  Patient Instructions: 1)  Keep appointment with Dr. McDowell/Gene for Wed, October 09, 2010 at 2:40pm.  2)  Have Dr. Fletcher Anon office fax a copy of labs to our office at [phone]. 3)  Your physician has requested that you have an echocardiogram.  Echocardiography is a painless test that uses sound waves to create images of your heart. It provides your doctor with information about the size and shape of your heart and how well your heart's chambers and valves are working.  This procedure takes approximately one hour. There are no restrictions for this procedure. 4)  Increase Lopressor (metoprolol tart) to 50mg  by mouth two times a day. Hold if systolic Blood pressure (top number) is <100 or heart rate <55. Prescriptions: METOPROLOL TARTRATE 50 MG TABS (METOPROLOL TARTRATE) Take 1 tablet by mouth two times a day. Hold if systolic BP  <100 or Heart rate <55.  #60 x 6   Entered by:   Cyril Loosen, RN, BSN   Authorized by:   Nelida Meuse, PA-C   Signed by:   Cyril Loosen, RN, BSN on 10/02/2010   Method used:   Electronically to        [facility]* (retail)       [address]       [place], [place]  [address]       Ph: [phone]       Fax: [phone]   RxID:   [id number]  I have reviewed and approved all prescriptions at the time of the office visit. Nelida Meuse, PA-C  October 02, 2010 2:10 PM

## 2010-10-15 NOTE — Assessment & Plan Note (Signed)
Summary: F/U per 2/29 OV w/[name]   Visit Type:  Follow-up Primary Provider:  Dr. Lilyan Cochran   History of Present Illness: 54 year old woman presents for followup. She was seen recently in late February to review followup testing. She did have PVCs noted with occasional brief runs, no atrial fibrillation. Cardiolite study demonstrated a moderately large anterior fixed defect, possibly scar, however with no active ischemia, and LVEF 48%. Symptomatically she was doing well. A followup echocardiogram was ordered, reviewed below.  Patient reports appreciably reduced amount of palpitations, since we increased her Lopressor to current dose. She continues to report no exertional chest discomfort. Unfortunately, she continues to smoke, but has cut back significantly.  A recent TSH level was ordered, per [name]. This was 0.5.  The 2-D echo which we ordered, and reviewed by [name] on March 5, yielded normal LVF (EF 55-60%), and no focal wall motion abnormalities.  Preventive Screening-Counseling & Management  Alcohol-Tobacco     Smoking Status: current     Packs/Day: 1 pack / 2 weeks  Current Medications (verified): 1)  Imdur 30 Mg Xr24h-Tab (Isosorbide Mononitrate) .... Take 1/2 Tablet By Mouth Once A Day 2)  Metoprolol Tartrate 50 Mg Tabs (Metoprolol Tartrate) .... Take 1 Tablet By Mouth Two Times A Day. Hold If Systolic Bp <100 or Heart Rate <55. 3)  Plavix 75 Mg Tabs (Clopidogrel Bisulfate) .... Take 1 Tablet By Mouth Once A Day 4)  Pantoprazole Sodium 40 Mg Tbec (Pantoprazole Sodium) .... Take 1 Tablet By Mouth Once A Day 5)  Nitroglycerin 0.4 Mg Subl (Nitroglycerin) .... One Tablet Under Tongue Every 5 Minutes As Needed For Chest Pain---May Repeat Times Three 6)  Aspir-Low 81 Mg Tbec (Aspirin) .... Take 1 Tablet By Mouth Once A Day 7)  Lipitor 80 Mg Tabs (Atorvastatin Calcium) .... Take One Tablet By Mouth Daily. 8)  Prozac 40 Mg Caps (Fluoxetine Hcl) .... Take 1 Capsule By Mouth  Once A Day 9)  Synthroid 125 Mcg Tabs (Levothyroxine Sodium) .... Take 1 Tablet By Mouth Once A Day 10)  Alprazolam 1 Mg Tabs (Alprazolam) .... Take 1 Tablet By Mouth Four Times A Day As Needed 11)  Hydrocodone-Acetaminophen 10-500 Mg Tabs (Hydrocodone-Acetaminophen) .... Take 1/2-1 Tablet By Mouth Two Times A Day As Needed  Allergies (verified): No Known Drug Allergies  Past History:  Past Medical History: Last updated: 02/08/2010 CAD - nonobstructive circ/RCA, DES LAD/diagonal 2005, LVEF 60% G E R D Hyperlipidemia Hypertension Hypothyroidism Myocardial Infarction - NSTEMI 2009 Fibromyalgia Depression - h/o suicide attempt  Social History: Last updated: 02/08/2010 Disabled  Divorced  Tobacco Use - Former Alcohol Use - no Drug Use - no  Social History: Packs/Day:  1 pack / 2 weeks  Review of Systems       No fevers, chills, hemoptysis, dysphagia, melena, hematocheezia, hematuria, rash, claudication, orthopnea, pnd, pedal edema. All other systems negative.   Vital Signs:  Patient profile:   55 year old female Weight:      212.75 pounds Pulse rate:   57 / minute BP sitting:   102 / 67  (left arm) Cuff size:   regular  Vitals Entered By: Tracy Brunette, LPN (October 08, 1608 2:57 PM) Is Patient Diabetic? No Comments follow up    Physical Exam  Additional Exam:  GEN: 55 year old female, obese, no distress HEENT: NCAT,PERRLA,EOMI NECK: palpable pulses, no bruits; no JVD; no TM LUNGS: CTA bilaterally HEART: RRR (S1S2); no significant murmurs; no rubs; no gallops ABD: soft, NT;  intact BS EXT: intact distal pulses; no edema SKIN: warm, dry MUSC: no obvious deformity NEURO: A/O (x3)      Echocardiogram  Procedure date:  10/07/2010  Findings:      Normal LVEF 55-60% without regional wall motion abnormalities, trace mitral regurgitation, no pericardial effusion.  Impression & Recommendations:  Problem # 1:  PALPITATIONS (ICD-785.1)  much improved, following  recent up titration of Lopressor to current dose of 50 mg b.i.d. this will hopefully be sufficient, given that we are precluded from any further titration, secondary to low normal blood pressure and basal heart rate.  Problem # 2:  CORONARY ATHEROSCLEROSIS NATIVE CORONARY ARTERY (ICD-414.01)  no further workup currently indicated. Recent 2-D echo indicates normal LVF and no focal WMAs. This is in contrast to her recent stress test which suggested a moderately large anterior defect, possibly scar; EF 48%. In the continued absence of any exertional chest discomfort, would not pursue further diagnostic testing. Patient is in complete agreement with this, and we will schedule her to return to Dr. Diona Cochran in 6 months.  Problem # 3:  TOBACCO ABUSE (ICD-305.1)  patient acknowledges the importance of smoking cessation, and has cut back significantly. She appears motivated to stop altogether  Problem # 4:  MIXED HYPERLIPIDEMIA (ICD-272.2)  continued aggressive management recommended. Patient is on full dose Lipitor, and had a previous LDL of 71, in April 2011. She is due for surveillance labs with [name], in the next few weeks.  Patient Instructions: 1)  Your physician wants you to follow-up in: 6 months. You will receive a reminder letter in the mail one-two months in advance. If you don't receive a letter, please call our office to schedule the follow-up appointment. 2)  Your physician recommends that you continue on your current medications as directed. Please refer to the Current Medication list given to you today.

## 2010-11-19 LAB — URINALYSIS, ROUTINE W REFLEX MICROSCOPIC
Bilirubin Urine: NEGATIVE
Glucose, UA: NEGATIVE mg/dL
Hgb urine dipstick: NEGATIVE
Ketones, ur: NEGATIVE mg/dL
Nitrite: NEGATIVE
Protein, ur: NEGATIVE mg/dL
Specific Gravity, Urine: <1.005 — ABNORMAL LOW (ref 1.005–1.030)
Urobilinogen, UA: 0.2 mg/dL (ref 0.0–1.0)
pH: 6.5 (ref 5.0–8.0)

## 2010-12-17 NOTE — Assessment & Plan Note (Signed)
[facility]                          [facility] OFFICE NOTE   Tracy, Cochran                    MRN:          [id number]  DATE:12/24/2006                            DOB:          1956-05-17    PRIMARY CARDIOLOGIST:  [name], M.D.   PRIMARY CARE PHYSICIAN:  [name], M.D.   HISTORY OF PRESENT ILLNESS:  [name] is a 55 year old female patient  followed by Dr. Diona Cochran who has a history of coronary disease, status  post anterior wall myocardial infarction, treated with a Taxus drug-  eluting stent to the LAD and diagonal branch bifurcation in March 2006,  who has undergone multiple cardiac catheterizations since that time for  recurrent chest pain.  She has continued to have noted nonobstructive  disease and patent stent in the LAD by subsequent cardiac  catheterizations.  She did have a catheterization in early 2006 after  presenting with chest discomfort and elevated enzymes.  According to the  records, there is a question of whether or not she had coronary  vasospasm, as she had nonobstructive disease and patent stents at that  catheterization.  She returns today for routine followup.  She notes  that she has had continuous chest pain since she was last seen here.  She says that she has had this discomfort since her last cardiac  catheterization.  It is the same discomfort that prompted her cardiac  catheterization.  She says that it occurs at any time.  It is not  related to exertion.  It is a substernal pressure that she mainly feels  at the base of her neck.  There is no associated shortness of breath or  nausea.  She denies any diaphoresis, near syncope, or syncope.  She  denies any history of orthopnea, paroxysmal nocturnal dyspnea, or lower  extremity edema.  She does note occasional palpitations.  These are  brief and found to be more consistent with PACs versus PVCs.  She does  note that she takes  nitroglycerin for her chest discomfort.  This  usually results in relief after 1 tablet.  She rarely has to take 2  tablets.   CURRENT MEDICATIONS:  1. Lopressor 25 mg 1/2 tablet b.i.d.  2. Plavix 75 mg daily.  3. Aspirin 325 mg daily.  4. Protonix 40 mg daily.  5. Lipitor 80  mg daily.  6. Synthroid 175 mcg daily.  7. Prilosec 40 mg a day.  8. Xanax p.r.n.  9. Hydrocodone p.r.n.  10.Oxycodone p.r.n.   ALLERGIES:  No known drug allergies.   PHYSICAL EXAMINATION:  GENERAL:  She is a well-developed, well-nourished  female in no acute distress.  VITAL SIGNS:  Blood pressure 159/79, pulse 72, weight 224 pounds.  HEENT:  Normal.  NECK:  Without JVD.  Carotids without bruits bilaterally.  CARDIAC:  Normal S1, S2.  Regular rate and rhythm, without murmur.  LUNGS:  Clear to auscultation bilaterally, without wheezing, rhonchi, or  rales.  ABDOMEN:  Soft, nontender, with normoactive bowel sounds.  No  organomegaly.  EXTREMITIES:  Without edema.  Calves are soft and nontender.  SKIN:  Warm and dry.  NEUROLOGIC:  She is alert and oriented x3.  Cranial nerves II-XII  grossly intact.   Electrocardiogram revealed a sinus rhythm, with a heart rate of 64,  normal axis, no acute changes.   IMPRESSION:  1. Chest discomfort.      a.     Question coronary vasospasm versus gastrointestinal       etiology.  2. Coronary artery disease.      a.     Status post anterior wall myocardial infarction, treated       with Taxus drug-eluting stent placement to the left anterior       descending and diagonal branch bifurcation, March 2006.      b.     Subsequent multiple cardiac catheterizations since that time       for recurrent chest pain.      c.     History of admission for abnormal cardiac enzymes and chest       discomfort, with nonobstructive disease and patent stents at       catheterization, Dec 24, 2004.      d.     Cardiac catheterization, September 2007, revealing patent       stents in  the left anterior descending, irregularities in the       large diagonal branch and small native left anterior descending,       30% mid-circumflex, and 30% mid-right coronary artery stenosis.  3. Good left ventricular function, with an ejection fraction of 60%,      and very mild anterolateral wall hypokinesis at catheterization,      September 2007.  4. Gastroesophageal reflux disease.  5. Fibromyalgia.  6. Dyslipidemia.  7. History of major depressive disorder.      a.     History of previous suicide attempts.  8. History of medication noncompliance.  9. Palpitations.   PLAN:  The patient presents to our office today for followup.  She does  have continued chest discomfort.  This is discomfort that she has had  all along, even since her last heart catheterization.  There has really  not been much change.  She takes nitroglycerin, with prompt relief.  I  suspect she may have a degree of coronary vasospasm, as she did have a  previous history of elevated enzymes, with no culprit lesion on cardiac  catheterization, nonobstructive disease, and patent stents.  Also, she  has gastroesophageal reflux disease.  I question whether or not this may  be contributing as well.  I have recommended that we place her on Imdur  15 mg a day.  She says that she has taken Imdur in the past, with relief  of these symptoms.  I have also recommended that we increase her  Protonix to 40 mg twice a day.  She does note some palpitations.  I  think these are more consistent with premature atrial versus ventricular  contractions.  At this point, we will just check a BMET and a TSH.  If  she continues to have symptoms, we can certainly set her up for an event  monitor.  She does drink 2-3 cups of coffee a day and 2-3 glasses of  iced tea per day.  I wonder if the increased caffeine may be  contributing some to her symptoms.  I have asked her to cut down on that as well.  I have recommended that she follow up in  the next 3-4 weeks  with either  Dr. Diona Cochran or myself on a day that Dr. Diona Cochran is here.  If she continues to have symptoms or there is a change in her symptoms  at all, we will certainly consider proceeding with stress Myoview study  at that time.  She knows to call us and return sooner if she has any  change in her symptoms between now and her follow-up appointment.      Tereso Newcomer, PA-C  Electronically Signed      Learta Codding, MD,FACC  Electronically Signed   SW/MedQ  DD: 12/24/2006  DT: 12/24/2006  Job #: [id number]   cc:   Lorin Picket A. Gerda Diss, MD

## 2010-12-17 NOTE — Op Note (Signed)
[name], [name]             ACCOUNT NO.:  [id number]   MEDICAL RECORD NO.:  [id number]          PATIENT TYPE:  AMB   LOCATION:  [facility]                          FACILITY:  [facility]   PHYSICIAN:  Cindee Salt, M.D.       DATE OF BIRTH:  03-22-1956   DATE OF PROCEDURE:  11/02/2007  DATE OF DISCHARGE:                               OPERATIVE REPORT   PREOPERATIVE DIAGNOSIS:  Carpal tunnel syndrome, right hand.   POSTOPERATIVE DIAGNOSIS:  Carpal tunnel syndrome, right hand.   OPERATION:  Decompression right median nerve.   SURGEON:  Cindee Salt, M.D.   ASSISTANTCarolyne Fiscal, R.N.   ANESTHESIA:  General.   DATE OF OPERATION:  November 02, 2007.   ANESTHESIOLOGIST:  Burna Forts, M.D.   HISTORY:  The patient is a 55 year old female with a history of carpal  tunnel syndrome, EMG nerve conductions positive which has not responded  to conservative treatment.  She has elected to undergo surgical  decompression of the median nerve.  Pre and postoperative course have  been discussed along with risks and complications.  She is aware that  there is no guarantee with the surgery, possibility of infection,  recurrence, injury to arteries, nerves, tendons, incomplete relief of  symptoms, dystrophy.  In the preoperative area the patient is seen,  questions encouraged and answered.  Antibiotic given.   DESCRIPTION OF THE PROCEDURE:  The patient is brought to the operating  room where a general anesthetic was carried out under Dr. Marlane Mingle  direction.  She was prepped and draped using DuraPrep, supine position,  right arm free.  A longitudinal incision was made in the palm and  carried down through subcutaneous tissue.  Bleeders were  electrocauterized.  Palmar fascia was split.  Superficial palmar arch  identified.  Flexor tendon in the ring and little finger identified to  the ulnar side of median nerve.  Carpal retinaculum was incised with  sharp dissection.  Right angle and Sewall retractor  were placed between  skin and forearm fascia.  The fascia was released for approximately 1.5  cm proximal to the wrist crease under direct vision.  Canal was  explored.  No further lesions were identified.  Area of compression of  the nerve was apparent.  The wound was irrigated.  Skin closed with  interrupted 5-0 Vicryl Rapide sutures.  Sterile compressive dressing and  splint to the wrist applied with fingers free.  The patient  tolerated the procedure well.  At deflation of tourniquet all fingers  immediately pinked.  She was taken to the recovery room for observation  in satisfactory condition.  She will be discharged home to return to the  [facility] in 1 week on Vicodin.           ______________________________  Cindee Salt, M.D.     GK/MEDQ  D:  11/02/2007  T:  11/02/2007  Job:  [id number]   cc:   Cindee Salt, M.D.  Scott A. Gerda Diss, MD

## 2010-12-17 NOTE — Cardiovascular Report (Signed)
[name], [name]             ACCOUNT NO.:  [id number]   MEDICAL RECORD NO.:  [id number]          PATIENT TYPE:  INP   LOCATION:  2020                         FACILITY:  [facility]   PHYSICIAN:  Arturo Morton. Riley Kill, MD, FACCDATE OF BIRTH:  June 08, 1956   DATE OF PROCEDURE:  08/09/2007  DATE OF DISCHARGE:                            CARDIAC CATHETERIZATION   INDICATIONS:  This very nice lady presented with some recurrent chest  pain.  She had a positive troponin in [place].  She was transferred to  [facility] for further evaluation.   PROCEDURE:  1. Left heart catheterization  2. Selective coronary angiography.  3. Selective left ventriculography.   DESCRIPTION OF PROCEDURE:  The patient was brought to the  catheterization lab and prepped and draped in usual fashion.  Through an  anterior puncture, the femoral artery was easily entered, and a 5-French  sheath was placed.  Following this, views of the left and right coronary  arteries were obtained in multiple angiographic projections.  Central  aortic and left ventricular pressures were measured with a pigtail.  Ventriculography was performed in the RAO projection.  She tolerated the  procedure well, and there were no complications.   HEMODYNAMIC DATA:  1. Central aortic pressure 123/74, mean 96.  2. Left atrial pressure 109/11.  3. No gradient on pullback across aortic valve.   ANGIOGRAPHIC DATA.:  1. The left main coronary artery is free of critical disease.  2. The LAD courses to the apex.  Importantly, the LAD divides, and      there are two small thin branches which constitute the LAD.  One      arises from the purported diagonal and the other one from the true      LAD which occupies to some extent a septal perforating location.      There is a bifurcation stent at the takeoff of the large diagonal,      and the LAD despite the bifurcation stent there does not appear to      be significant narrowing.  The stents themselves  appear to be      widely patent.  One branch provides the LAD proper which is small      and has a small septal perforator which has some ostial pinching at      its takeoff from the stent site, but it is relatively small.  A      large diagonal itself has a LAD portion, and supplies another side      branch, all of which are free of critical disease.  3. The circumflex has a first marginal that has 30% proximal      narrowing.  It does not appear to be high-grade.  The remainder of      the circumflex provides two large marginal branches which are free      of critical disease.  There is perhaps 30% narrowing in the mid      portion of the AV circumflex and mild luminal irregularity      thereafter, but no critical focal stenoses.  4. The right coronary artery  is a large-caliber vessel.  There is      about 20-30% ostial narrowing and 20% mid-narrowing but no evidence      of high-grade stenosis.   Ventriculography was done in the RAO projection.  There was really only  one good ventricular beat, but overall LV function appeared to be  preserved.   CONCLUSIONS:  1. Preserved overall LV function without segmental wall motion      abnormality that is definable.  2. Continued patency of the bifurcation stents and patency of the      other coronary vessels.  The patient will likely need medical      therapy.      Arturo Morton. Riley Kill, MD, [facility]  Electronically Signed     TDS/MEDQ  D:  08/09/2007  T:  08/10/2007  Job:  [id number]   cc:   Arturo Morton. Riley Kill, MD, [facility]  CV Laboratory

## 2010-12-17 NOTE — Assessment & Plan Note (Signed)
[facility]                          [facility] OFF[facility] NOTE   Tracy, Cochran                    MRN:          [id number]  DATE:12/21/2007                            DOB:          [birth year]    PRIMARY CARE PHYSICIAN:  Scott A. Gerda Diss, M.D.   REASON FOR VISIT:  Cardiac follow-up.   HISTORY OF PRESENT ILLNESS:  [name] comes in for a routine visit.  She had minor enzymatic evidence of a non-ST elevation myocardial  infarction back in January of this year and was transferred to [facility] for a repeat cardiac catheterization.  This study was done  by Dr. Riley Kill and revealed continued patency of the drug-eluting  bifurcation stents in the left anterior descending and diagonal branch.  She had nonobstructive disease and was otherwise managed medically.  Her  ejection fraction was normal.  She reports doing very well since that  time without any significant angina.  She is interested in an exercise  regimen, and we talked about this some today.  In the meanwhile, she  also had carpal tunnel release surgery back in March.  She reports  compliance with her medications.   ALLERGIES:  NO KNOWN DRUG ALLERGIES.   PRESENT MEDICATIONS:  1. Lopressor 25 mg 1/2 tablet p.o. b.i.d.  2. Plavix 75 mg p.o. daily.  3. Aspirin 325 mg p.o. daily.  4. Lipitor 80 mg p.o. daily.  5. Prozac 40 mg p.o. daily.  6. Synthroid 100 mcg p.o. daily.  7. Imdur 20 mg p.o. daily.  8. Aciphex 40 mg p.o. daily.   REVIEW OF SYSTEMS:  As noted in the present illness.  Otherwise  negative.   PHYSICAL EXAMINATION:  VITAL SIGNS:  Blood pressure is 123/81, heart  rate is 69, weight is 231 pounds.  The patient is comfortable and in no  acute distress.  NECK:  No elevated jugular venous pressure, no loud bruits.  LUNGS:  Clear without labored breathing.  CARDIAC:  Regular rate and rhythm.  No loud murmur or gallop.  EXTREMITIES:  No pitting edema.   IMPRESSION AND RECOMMENDATIONS:  Cardiovascular disease status post  previous anterior wall myocardial infarction treated with bifurcation  drug-eluting stents to the left anterior descending and diagonal back in  March of 2006.  Recent angiography in January demonstrated stent patency  with overall mild coronary atherosclerosis that is being managed  medically at this point with good symptom control.  We talked about a  basic walking regimen and goal of 10 pound weight loss.  She is on a  good medical regimen with heart rate and blood pressure  control.  She is due to see Dr. Gerda Diss back for lipids.  Would suggest  aggressive LDL control around 70.  We will otherwise plan to see her  back over the next 6 months.     Jonelle Sidle, MD  Electronically Signed    SGM/MedQ  DD: 12/21/2007  DT: 12/21/2007  Job #: [id number]   cc:   Lorin Picket A. Gerda Diss, MD

## 2010-12-17 NOTE — Assessment & Plan Note (Signed)
[facility]                          [facility] NOTE   THARON, KITCH                    MRN:          [id number]  DATE:03/03/2007                            DOB:          1956/01/30    CARDIOLOGIST:  Dr. Simona Huh.   PRIMARY CARE PHYSICIAN:  Dr. [name].   HISTORY OF PRESENT ILLNESS:  Ms. Spengler is a 55 year old female patient  with a history of coronary disease and chronic chest discomfort who I  saw on May 22. Please see that note for complete details. I placed the  patient on low dose Imdur at 15 mg daily. We also increased her  Protonix. We did some blood work for palpitations and her TSH was low at  0.06. She has followed up with her primary care physician as to who has  reduced her dose of Synthroid since we last saw her. She returns today  for follow up.   The patient notes that she has had no further chest discomfort since I  last saw her. She has been walking and swimming on a daily basis without  chest pain or shortness of breath. She denies any syncope. She denies  any palpitations.   CURRENT MEDICATIONS:  1. Lopressor 25 mg 1/2 tablet b.i.d.  2. Plavix 75 mg daily.  3. Aspirin 325 mg daily.  4. Lipitor 80 mg daily.  5. Synthroid 100 mcg daily.  6. Prozac 40 mg daily.  7. Imdur 15 mg daily.  8. Protonix 40 mg b.i.d.   ALLERGIES:  No known drug allergies.   SOCIAL HISTORY:  She denies smoking.   PHYSICAL EXAMINATION:  GENERAL:  She is a well developed, well nourished  female in no acute distress.  VITAL SIGNS:  Blood pressure 119/75, pulse 58, weight 227 pounds.  HEENT:  Normal.  NECK:  Without JVD.  CARDIAC:  S1, S2. Regular rate and rhythm.  LUNGS:  Clear to auscultation bilaterally.  ABDOMEN:  Soft and nontender.  EXTREMITIES:  Without edema.   IMPRESSION:  1. Chest discomfort - resolved on current therapy.  2. Coronary artery disease.      a.     Status post anterior wall myocardial  infarction treated with       a Taxus drug eluting stent to the LAD and diagonal branch       bifurcation, March 2006.      b.     Cardiac catheterization September 2007 revealing patent       stents.  3. Good LV function.  4. Gastroesophageal reflux disease.  5. Hypothyroidism.  6. Fibromyalgia.  7. Dyslipidemia.  8. History of major depressive disorder.  9. History of medical non-compliance.   PLAN:  The patient presents to the office today for follow up. I am  delighted that her chest pain has resolved. We will continue on the same  medications as listed above. She has also had no further palpitations.  This was probably secondary to her Synthroid dose being too high, as  well as her caffeine intake (which she has decreased). No further  changes will be made  today. She will follow up with Dr. Diona Browner in six  months time. She should follow up sooner as needed.      Tereso Newcomer, PA-C  Electronically Signed      Learta Codding, MD,FACC  Electronically Signed   SW/MedQ  DD: 03/03/2007  DT: 03/04/2007  Job #: [id number]   cc:   Lorin Picket A. Gerda Diss, MD

## 2010-12-17 NOTE — Discharge Summary (Signed)
NAMEJANETH, [name]             ACCOUNT NO.:  [id number]   MEDICAL RECORD NO.:  [id number]          PATIENT TYPE:  INP   LOCATION:  2020                         FACILITY:  [facility]   PHYSICIAN:  Jonelle Sidle, MD DATE OF BIRTH:  November 29, 1955   DATE OF ADMISSION:  08/08/2007  DATE OF DISCHARGE:  08/10/2007                               DISCHARGE SUMMARY   PROCEDURES:  1. Cardiac catheterization.  2. Coronary arteriogram.  3. Left ventriculogram.   PRIMARY FINAL DISCHARGE DIAGNOSIS:  Chest pain.   SECONDARY DIAGNOSIS:  1. Hypothyroidism.  2. Depression.  3. Gastroesophageal reflux disease.  4. Fibromyalgia.  5. Status post carpal tunnel surgery, tubal ligation and foot surgery.  6. Family history of coronary artery disease in her father who had a      myocardial infarction at age 43.  7. Allergy or intolerance to intravenous contrast.  8. History of medical noncompliance.  9. Remote history of tobacco use.  10.Obesity.  11.Hyperglycemia with a nonfasting blood sugar of 201 and a fasting      blood sugar of 117.  12.Status post anterior wall myocardial infarction, treated with a      Taxus drug-eluting stent to the left anterior descending and      diagonal bifurcation in 2006.  13.History of recurrent chest pain, status post cardiac      catheterization in September 2007, with patent stents.   TIME AT DISCHARGE:  38 minutes.   HOSPITAL COURSE:  [name] is a 55 year old female with a history of  coronary artery disease.  She went to [facility] because of  recurrent chest pain and was evaluated there.  Her troponin was  elevated, and it was felt that she needed further evaluation and cardiac  catheterization, and she was transferred to [facility].   Her peak troponin at [facility] was 0.49.  At [facility], her enzymes were  rechecked and her peak troponin here was 0.15.  Her CK-MBs were all  negative.  Because of the elevation in her troponin, it was  felt that  cardiac catheterization was indicated, and this was performed on August 09, 2007.   Her cardiac catheterization showed less than 10% in-stent restenosis.  Other lesions of 20% to 30% were noted in the RCA, circumflex and OM.  She had a normal EF with no wall motion abnormalities.  Dr. Riley Kill  evaluated the films and felt that a D-dimer should be checked, which was  normal.   On August 10, 2007, [name] was ambulating without chest pain or  shortness of breath.  She was evaluated by Dr. Diona Browner, who questioned  if she had transient plaque rupture leading to the presentation.  She  was noted to be hyperglycemic, and she is to follow up with her family  physician on this.  She was considered by Dr. Diona Browner to be stable for  discharge outpatient followup arranged.   DISCHARGE INSTRUCTIONS:  Her activity level is to be increased gradually  with no lifting for a week, and no driving for 2 days.  She is to call  our office  for any problems with the cath site.  She is to follow up  with Dr. Diona Browner on January 20th at 9:45.  She is to follow up with Dr.  [name] as needed.   DISCHARGE MEDICATIONS:  1. Aspirin 325 mg q. day.  2. Lipitor 80 mg q. day.  3. Xanax 1 mg t.i.d.  4. Plavix 75 mg q. day.  5. Colace 200 mg q. day.  6. Prozac 40 mg q. day.  7. Protonix 40 mg q. day.  8. Synthroid 125 mcg q. day.  9. Metoprolol 25 mg b.i.d.  10.Protonix 40 mg q. day.      [name], PA-C      Jonelle Sidle, MD  Electronically Signed    RB/MEDQ  D:  08/10/2007  T:  08/10/2007  Job:  [id number]   cc:   Lorin Picket A. Gerda Diss, MD

## 2010-12-18 ENCOUNTER — Other Ambulatory Visit: Payer: Self-pay | Admitting: Cardiology

## 2010-12-18 NOTE — Telephone Encounter (Signed)
**Note De-identified [name] Obfuscation** [place] pt. 

## 2010-12-18 NOTE — Telephone Encounter (Signed)
Med increased 10/02/10 to 1 twice daily instead of 1/2

## 2010-12-20 NOTE — Op Note (Signed)
[name], [name]             ACCOUNT NO.:  [id number]   MEDICAL RECORD NO.:  [id number]          PATIENT TYPE:  AMB   LOCATION:  DAY                           FACILITY:  [facility]   PHYSICIAN:  Lionel December, M.D.    DATE OF BIRTH:  [birth year]   DATE OF PROCEDURE:  01/02/2006  DATE OF DISCHARGE:                                 OPERATIVE REPORT   PROCEDURE:  Colonoscopy.   INDICATION:  Truc is a 54 year old Caucasian female who is undergoing  average risk screening colonoscopy.  Procedure was reviewed with the patient  and informed consent was obtained.   MEDICATIONS FOR CONSCIOUS SEDATION:  Demerol 25 mg IV and Versed 6 mg IV.   FINDINGS:  Procedure performed in endoscopy suite.  The patient's vital  signs and O2 sat were monitored during procedure and remained stable.  The  patient was placed left lateral recumbent position and rectal examination  performed.  No abnormality noted on external or digital exam.  Olympus  videoscope was placed in the rectum and advanced under vision into sigmoid  colon beyond.  Preparation was satisfactory.  Scope was passed into cecum  which was identified by ileocecal valve and appendiceal orifice.  Pictures  taken for the record.  As the scope was withdrawn, colonic mucosa was  carefully examined and there were no polyps, tumor masses or diverticular  changes.  Rectal mucosa similarly was normal.  Scope was retroflexed to  examine anorectal junction and small hemorrhoids were noted below the  dentate line.  Endoscope was straighten and withdrawn.  The patient  tolerated the procedure well.   FINAL DIAGNOSIS:  Small external hemorrhoids, otherwise normal colonoscopy.   RECOMMENDATIONS:  1.  She will resume her usual meds including Plavix and aspirin.  2.  Yearly Hemoccults.  3.  You may consider next screening exam in 10 years from now.      Lionel December, M.D.  Electronically Signed     NR/MEDQ  D:  01/02/2006  T:  01/02/2006  Job:   [id number]   cc:   Lorin Picket A. Gerda Diss, MD  Fax: [phone]

## 2010-12-20 NOTE — Op Note (Signed)
NAMEKIMLEY, [name]             ACCOUNT NO.:  [id number]   MEDICAL RECORD NO.:  [id number]          PATIENT TYPE:  AMB   LOCATION:  DAY                           FACILITY:  [facility]   PHYSICIAN:  Vickki Hearing, M.D.DATE OF BIRTH:  [birth year]   DATE OF PROCEDURE:  01/16/2006  DATE OF DISCHARGE:                                 OPERATIVE REPORT   INDICATIONS FOR PROCEDURE:  Carpal tunnel syndrome, left wrist.   PREOPERATIVE DIAGNOSIS:  Carpal tunnel syndrome, left wrist.   POSTOPERATIVE DIAGNOSIS:  Carpal tunnel syndrome, left wrist.   PROCEDURE:  Left carpal tunnel release.   SURGEON:  Dr. Romeo Apple.   ASSISTANTS:  None.   ANESTHETIC:  Bier block with supplemental 1% lidocaine 10 mL.   OPERATIVE FINDINGS:  Tight carpal tunnel.   DETAILS OF PROCEDURE:  The patient was identified as [name] in the  preop area.  She marked her left wrist as the surgical site, countersigned  by the surgeon.  History and physical updated.  Antibiotic started.  The  patient taken to the operating room for Bier block.   Time-out procedure was completed, after prep and drape, straight incision  was made over the carpal tunnel.  Subcutaneous tissue was divided to the  palmar fascia.  Distal aspect of the carpal tunnel was identified.  Blunt  dissection was carried out beneath the carpal tunnel.  Transverse carpal  ligament was released.  Carpal tunnel contents were inspected.  Median nerve  was found to be compressed.  Wound was irrigated and closed with 3-0 nylon  suture, injected with half percent Marcaine plain, 10 mL sterile dressing  was applied.  Tourniquet was released.  Fingers looked good.  The patient  taken to recovery in stable condition.  Follow-up on Monday.      Vickki Hearing, M.D.  Electronically Signed     SEH/MEDQ  D:  01/16/2006  T:  01/16/2006  Job:  [id number]

## 2010-12-20 NOTE — H&P (Signed)
NAMEBruna Cochran                            ACCOUNT NO.:  [id number]   MEDICAL RECORD NO.:  [id number]                   PATIENT TYPE:  INP   LOCATION:  2040                                 FACILITY:  [facility]   PHYSICIAN:  Salvadore Farber, M.D. [facility]         DATE OF BIRTH:  1956-07-25   DATE OF ADMISSION:  01/31/2004  DATE OF DISCHARGE:                                HISTORY & PHYSICAL   CHIEF COMPLAINT:  Dyspnea, chest pain.   HISTORY OF PRESENT ILLNESS:  [name] is a 55 year old lady who suffered  anterior myocardial infarction on Dec 27, 2003.  She had a thrombotic  occlusion of the LAD and very large diagonal.  I treated it with a crush  stent technique with the LAD stent crushed and the two diagonal was larger  than the LAD.  Her postinfarct course was relatively unremarkable.  Ejection  fraction was 45%.  She was anticoagulated for apical akinesis.   She has done fairly well after her myocardial infarction but has been  trouble by fatigue for a number of weeks.  Over the past two weeks, she has  also developed exertional dyspnea with activities such as house cleaning;  however, she has continued to tolerate cardiac exercises at cardiac  rehabilitation.  These symptoms have prompted decrease in her Toprol XL and  cessation of her statin.   Tonight, at approximately 7 p.m., she developed relatively abrupt onset of 6  out of 10 substernal chest pain radiating to her left neck which is similar  in character to that which accompanied her myocardial infarction.  The pain  lasted approximately one hour.  It was relieved with morphine upon  presentation to the [facility] Emergency Room.  She is now pain-free.  There was no associated shortness of breath, diaphoresis, or nausea, or  palpitations.   PAST MEDICAL HISTORY:  1. Status post tubal ligation.  2. Status post cervical cryosurgery.  3. Hypothyroidism.  4. Fibromyalgia.  5. Coronary disease as above.   MEDICATIONS:  1. Aspirin 81 mg q.d.  2. Plavix 75 mg q.d.  3. Prozac 40 mg q.d.  4. Synthroid alternating 175 mcg and 87.5 mcg.  5. Altace 2.5 mg q.d.  6. Toprol XL 50 mg q.d.  7. Coumadin 10 mg q.h.s. with 7.5 mg on Sunday.  8. Xanax p.r.n.   ALLERGIES:  No known drug allergies.   SOCIAL HISTORY:  [place]  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  She stopped smoking in March.  Denies illicit drug use and  alcohol use.  She does participate in cardiac rehabilitation.   FAMILY HISTORY:  Both parents had developed coronary disease in their 45s.   REVIEW OF SYMPTOMS:  Negative in detail except as above.   PHYSICAL EXAMINATION:  GENERAL:  She is generally  well-appearing and in no  distress.  VITAL SIGNS:  Heart rate of 52 and blood pressure 112/69.  Oxygen saturation  is 99% on two liters.  She is afebrile.  NECK:  She has no thyromegaly.  Jugular venous pressure is approximately 7  cm.  LUNGS:  Clear to auscultation.  HEART:  Her point of maximal cardiac impulse is not palpable.  There is a  regular rate and rhythm without murmur, rub, or gallop.  ABDOMEN:  Obese, soft, nondistended, and nontender.  There is no  hepatosplenomegaly.  No mass and no bruit.  Bowel sounds are normal.  EXTREMITIES:  Warm without clubbing, cyanosis, edema, or ulceration.  PULSES:  The carotid pulses are 2+ bilaterally without bruits.  Femoral  pulses are 2+ bilaterally without bruits.  Posterior tibial pulses are 2+  bilaterally.  SKIN:  Tattoo above the right lateral malleolus.  No rash.   LABORATORY DATA:  Electrocardiogram demonstrates sinus bradycardia at 49  beats per minute with old anteroseptal myocardial infarction.  No suggestion  of ischemia.   Sodium 137, potassium 3.8, creatinine 0.7, glucose 118.  CK-MB 0.8, troponin  less than 0.01.  CPK 57, INR 3.4, PTT 28.  WBC 7.9, hematocrit 38,  platelets  283,000.   IMPRESSION:  1. A 55 year old lady who presents with recurrent dyspnea and now chest     discomfort reminiscent to the discomfort that accompanied her myocardial     infarction.  Electrocardiogram and initial enzymes are reassuring.     However, given the complexity of her intervention, I recommend repeat     cardiac catheterization.  We will rule out myocardial infarction with     serial enzymes.  We will plan on cardiac catheterization when the INR is     less than 1.7.  We will administer vitamin K in an effort to reduce this.     We will plan right heart catheterization at the time of coronary     angiography as well.  We will plan on initiating heparin when the INR is     less than 2.0.  We will plan on adding glycoprotein IIb 3 inhibitor if     the enzymes return elevated or she has recurrent chest discomfort.  2. Left ventricular dysfunction:  Continue ACE inhibitor and beta blocker.  3. Dyslipidemia:  Continue to withhold statin for the time being.  After     diagnosis is more clear, we will likely plan on resuming it                                                Salvadore Farber, M.D. [facility]    WED/MEDQ  D:  02/01/2004  T:  02/01/2004  Job:  [id number]   cc:   Jonelle Sidle, M.D. [facility]   Scott A. Gerda Diss, M.D.  [address], [address]  [place]  [place] [address]  Fax: [phone]

## 2010-12-20 NOTE — H&P (Signed)
[name], [name]             ACCOUNT NO.:  [id number]   MEDICAL RECORD NO.:  [id number]          PATIENT TYPE:  INP   LOCATION:  2922                         FACILITY:  [facility]   PHYSICIAN:  Madolyn Frieze. Jens Som, MD, FACCDATE OF BIRTH:  1955-10-30   DATE OF ADMISSION:  04/23/2006  DATE OF DISCHARGE:                                HISTORY & PHYSICAL   PRIMARY CARE PHYSICIAN:  Dr. [name].   PRIMARY CARDIOLOGIST:  Dr. Diona Browner.   CHIEF COMPLAINT:  Chest pain.   HISTORY OF PRESENTING ILLNESS:  [name] is a 55 year old white woman  with a history of coronary artery disease, status post stent placement in  March 2005 and repeated admissions and catheterizations for chest pain which  have been negative, history of fibromyalgia, hypothyroidism, dyslipidemia,  major depression, GERD and tobacco abuse, recently back from [place] in  [place], who developed chest pain at 10 p.m. on April 22, 2006.  The patient was apparently laughing and having a good time with her friends  when she suddenly developed chest pain, retrosternal, 8/10, radiating to  central part of her neck and back, similar to her prior chest pain episodes,  associated with diaphoresis, nausea and 1 episode of vomiting.  The patient  took 2 nitroglycerin to no relief, presented to [facility] Emergency  Department and was given sublingual nitroglycerin as well as put on a  nitroglycerin drip.  Her chest pain was later relieved after being put on  the drip.  The patient was later transferred to [facility] from  [place]  for observation and further evaluation.   PAST MEDICAL HISTORY:  1. Coronary artery disease, status post drug-eluting stents in LAD and      diagonal, using crush technique (Taxus stents for anterior MI).  2. History of multiple catheterization admissions for recurrent chest      discomfort (June 2005, September 2005, May 2006, September 2006), which      have been repetitively  negative with improvement of ejection fraction      from 45% (March 2005) to 56% on recent catheterization in September      2006.  3. History of fibromyalgia.  4. Hypothyroidism.  5. Dyslipidemia.  6. History of major depression with previous suicidal attempts.  7. Gastroesophageal reflux disease.  8. Obesity.  9. History of noncompliance with medications secondary to financial      constraints.   HOME MEDICATIONS:  1. Plavix 75 mg p.o. daily.  2. Lipitor 40 mg p.o. daily.  3. Aspirin 325 mg p.o. daily.  4. Prozac 60 mg p.o. daily.  5. Xanax 0.25 mg q.i.d.  6. Synthroid 0.125 mcg p.o. daily.  7. Flexeril 20 mg p.o. p.r.n.  8. Hydrocodone 5/500 p.o. p.r.n.  9. Protonix 40 mg p.o. daily.   SUBSTANCE HISTORY:  The patient apparently quit smoking 2 years ago; she  smoked around 1 pack per day for 30 years; however, she does admit to 1 pack  in the whole of last week while she was vacationing.  No history of alcohol  or drug abuse.   FAMILY HISTORY:  The  patient's mother died at 33 years of age of intestinal  obstruction.  Father had a heart attack at the age of 49 years.  The patient  has 1 brother who is currently deceased secondary to surgery complications;  he had a congenital heart disease.   SOCIAL HISTORY:  The patient lives in [place] with her son, who is 22 years of  age.  She is divorced and has 3 sons and currently is on disability.   REVIEW OF SYSTEMS:  The patient has chest pain and palpitations, nausea and  vomiting, no history of fevers, chills, headache, nosebleeds, rashes,  frequency or urgency or urination, bright red blood per rectum, melena, etc.   PHYSICAL EXAMINATION:  VITAL SIGNS:  Temperature -- afebrile, blood pressure  102/61, pulse 77, respiratory rate of 23, O2 SATs 99% on 2 L.  GENERAL APPEARANCE:  The patient did not appear in any visible distress.  EYES:  Pupils are equal, round and reactive to light.  Extraocular movements  intact.  ENT:  Clear.   No erythema or exudates.  NECK:  Supple.  No adenopathy.  No thyromegaly.  No bruit.  No JVD.  HEART:  Regular rate and rhythm.  No murmurs, rubs, or gallops.  S1 and S2  heard.  LUNGS:  Clear to auscultation bilaterally.  No wheezes or rhonchi.  ABDOMEN:  Soft, non-distended and nontender.  Bowel sounds are present.  EXTREMITIES:  Warm.  Pulses 2+ bilaterally.  No cyanosis, clubbing or edema.  SKIN:  Normal.  NEUROLOGICAL:  Alert and oriented x4, grossly nonfocal.  Strength 5/5 in all  extremities in axial groups.  Normal sensation throughout.  Normal  cerebellar function.   RADIOLOGIC FINDINGS:  Chest x-ray pending.   ACCESSORY CLINICAL DATA:  EKG:  Rate 63, regular rhythm, normal axis, normal  P-R/QRS intervals, Q waves seen in lead I and aVL, ST elevations in V2 and  V3.   LABORATORY DATA ON ADMISSION:  White cell count of 12.3, hemoglobin 13.1,  hematocrit 38.7, MCV 93.2, MCHC 34, platelets 339,000.  Sodium 138,  potassium 3.2, chloride 104, bicarb 23, BUN 10, creatinine 0.9, glucose 138,  calcium 9.5.  CK 68, CK-MB 0.8, troponin 0.02.  BNP 25.  D-dimer 392.   ASSESSMENT AND PLAN:  1. Unstable angina:  With prior negative catheterizations, doubt ischemia;      however, considering her presentation, previous history and risk      factors, will admit to rule out acute myocardial infarction, to check      cardiac enzymes every 8 hours x3, EKG in the morning.  For now, aspirin      and beta blockers with parameters if urine drug screen negative.      Continue Lovenox full dose till cardiac enzymes negative x3, statin.      Also to wean nitroglycerin drip off.  Plan for a Myoview in the morning      or cardiac catheterization if EKG worsens or elevation of cardiac      enzymes.  2. Leukocytosis:  To look for obvious sources of infection.  Check chest x-      ray and urinalysis.  The patient does not have respiratory or urinary     symptoms at present.  3. Risk factor modification:   Patient currently on statin.  Tobacco      cessation consult.  4. Hypothyroidism:  To check TSH levels and to continue home dose of      Synthroid.  5. Depression:  To continue Prozac and Xanax at home dose.  6. Gastroesophageal reflux disease:  Continue Protonix 40 mg p.o. daily.  7. Hypokalemia:  To recheck potassium here at [facility] and replete as      needed.      Yetta Barre, M.D.  Electronically Signed     ______________________________  Madolyn Frieze. Jens Som, MD, [facility]    SS/MEDQ  D:  04/23/2006  T:  04/23/2006  Job:  [id number]

## 2010-12-20 NOTE — H&P (Signed)
[facility]  Patient:    [name] Visit Number: [id number] MRN: [id number]          Service Type: MED Location: 2A A202 01 Attending Physician:  Cassell Smiles. Dictated by:   [name], M.D. Admit Date:  11/28/2001                           History and Physical  CHIEF COMPLAINT:  Chest discomfort.  HISTORY OF PRESENT ILLNESS:  [age]-year-old white female relates a heaviness in her chest along with intermittent sharp pains in chest, radiates into the right arm.  Denies any numbness.  Relates she feels she cannot take a full deep breath.  The pain has been off and on over the past few days but very intense through the night and early this morning.  She presented to the emergency department and ER doctor was concerned about possibility of cardiac. The patient has been under some stress recently and has been separated.  She denies any reflux symptoms, denies any true pulmonary symptoms, does state she does a lot of lifting at work.  PAST MEDICAL HISTORY:  Menorrhagia, occasional bronchitis.  SOCIAL HISTORY:  Separated, lives with family, smokes.  FAMILY HISTORY:  No premature heart disease.  REVIEW OF SYSTEMS:  See per above.  PHYSICAL EXAMINATION:  GENERAL:  NAD.  HEENT:  Benign.  NECK:  No masses.  No abnormal JVD.  CHEST:  CTA.  HEART:  Regular.  ABDOMEN:  Soft.  EXTREMITIES:  No edema.  NEUROLOGIC:  Normal.  LABORATORY AND ACCESSORY DATA:  EKG:  WNL.  Chest x-ray:  WNL.  Lab work, except for microcytic anemia, WNL.  ASSESSMENT AND PLAN: 1. Microcytic anemia -- due to menorrhagia -- will follow up as an outpatient. 2. Atypical chest pain -- needs Cardiolite to rule out ischemic process.  No    sign of myocardial infarction currently.  Repeat enzymes in the morning.    Follow closely. Dictated by:   [name], M.D. Attending Physician:  Cassell Smiles DD:  11/30/01 TD:  11/30/01 Job:  [id number]

## 2010-12-20 NOTE — Procedures (Signed)
[facility]  Patient:    [name] Visit Number: [id number] MRN: [id number]          Service Type: MED Location: 2A A202 01 Attending Physician:  [name] Dictated by:   [name], M.D. Proc. Date: 11/29/01 Admit Date:  11/28/2001 Discharge Date: 11/30/2001                            EKG Interpretations  PROCEDURE:  Electrocardiogram.  PHYSICIAN:  Dr. [name].  DESCRIPTION OF PROCEDURE:  Normal sinus rhythm.  No acute ST segment changes.  IMPRESSION:  Normal electrocardiogram. Dictated by:   [name], M.D. Attending Physician:  [name] DD:  12/19/01 TD:  12/20/01 Job: [id number]

## 2010-12-20 NOTE — H&P (Signed)
[name], [name]             ACCOUNT NO.:  [id number]   MEDICAL RECORD NO.:  [id number]          PATIENT TYPE:  INP   LOCATION:  A222                          FACILITY:  [facility]   PHYSICIAN:  Margaretmary Dys, M.D.DATE OF BIRTH:  1955/12/15   DATE OF ADMISSION:  04/21/2005  DATE OF DISCHARGE:  LH                                HISTORY & PHYSICAL   PRIMARY CARE PHYSICIAN:  Dr.  [name]   ADMITTING DIAGNOSIS:  Chest pain r/o  myocardial infarction.   HISTORY OF PRESENT ILLNESS:  [name] is a 55 year old white  female with a past history of myocardial infarction in March 2005.  At that  time, she was said to have a complicated kissing balloon angioplasty to the  LAD and diagonal.  She required a balloon pump and had an ejection fracture  of 45%.  In early July 2005, she had a cardiac catheterization for chest  pain.  It was noted that she had no restenosis in her diagonal or in her  LAD.  There was mild narrowing in the LAD.  The patient was noted to have  stopped taking Plavix two weeks prior to the presentation because of cost.  The patient reports having chest pain today.  It started about 8 p.m.  All  day she has been trying to move back into her old house which her ex-husband  evacuated seven months ago.  She has been doing a lot of lifting and lifting  heavy boxes around, she also reports significant anxiety from her divorce.  She describes her chest pain as pressure , like somebody sitting on her  chest associated with diaphoresis and shortness of breath.  She says this  pain is similar to what she had when she had a myocardial infarction.  She  reports the pain radiating to her shoulder and also to her left arm.  The  pain appeared to be promptly relieved by nitroglycerin tablets and spray  which were applied by the EMS.  Currently, the patient reports that she is  pain-free.  She denies any orthopnea or paroxysmal nocturnal dyspnea.  She  has no  palpitations.   She was recently admitted in May 2006 to [facility] where she had a  cardiac catheterization  which showed nonobstructive coronary artery  disease, and her stents were patent.  It was felt that her chest pain may be  related to transient plaque rupture or spasm as she also had minimal  elevation of her cardiac enzymes.She was placed on Imdur with no additional  intervention.   REVIEW OF SYSTEMS:  A 10-point review of systems is as mentioned in the  history of present illness above.   PAST MEDICAL HISTORY:  1.  History of coronary artery disease status post myocardial infarction      with ejection fracture of 45% in March 2005 with complicated kissing      baloon angioplasty to the LAD and diagonal  2.  Dyslipidemia.  3.  Hypothyroidism.  4.  Obesity.  5.  Fibromyalgia.  6.  Status post bilateral tubal ligation.  7.  Family history of premature coronary artery disease.  8.  Gastroesophageal reflux disease.  9.  History of major depression with suicidal attempt in the past.  10. History of noncompliance to her medications secondary to financial      history.  11. Remote history of tobacco.   MEDICATIONS:  1.  Aspirin 325 mg p.o. daily.  2.  Plavix 75 mg p.o. daily.  3.  Lipitor 40 mg p.o. daily.  4.  Prozac 40 mg p.o. daily.  5.  Synthroid 125 mg p.o. daily.  6.  Xanax 0.5 mg p.o. b.i.d. p.r.n.  7.  Protonix 40 mg p.o. daily.  8.  Imdur 30 mg p.o. q.h.s.   ALLERGIES:  She reports no known drug allergies.   SOCIAL HISTORY:  The patient is divorced.  She has two grown children.  She  quit smoking in March 2005 after she had a myocardial infarction.   FAMILY HISTORY:  Positive for premature coronary artery disease.   PHYSICAL EXAMINATION:  GENERAL:  She is comfortable, not in acute distress,  well oriented to time, place and person.  VITAL SIGNS:  Blood pressure 120/70.  Pulse 73, respiratory rate 20,  temperature 98.1. sats 98% on RA  HEENT:   Normocephalic, atraumatic.  Oral mucosa was moist with no exudates.  NECK:  Supple, no JVD.  No lymphadenopathy.  LUNGS:  Clear to with good air entry bilaterally.  HEART:  S1, S2, regular.  No S3 or S4, gallops or rubs.  ABDOMEN:  Soft, nontender.  Bowel sounds were positive.  No masses palpable.  EXTREMITIES:  No pretibial edema, no calf induration or tenderness was  noted.  CENTRAL NERVOUS SYSTEM:  Exam is grossly intact with no focal deficits.   LABORATORY DATA:  White blood cell count is 11.9, hemoglobin 12.5,  hematocrit 37.8, platelet count is 328,000, no left shift.  Sodium 137,  potassium 4.4, chloride 104, CO2 30, glucose 110, BUN of 5, creatinine 0.9,  calcium 9.1.  Cardiac enzymes x 3 in the emergency room by protocol were  negative.   A 12-lead EKG shows normal sinus rhythm with poor R-wave progression, no  acute ST-T changes.  Chest x-ray is reported as no acute disease.   ASSESSMENT AND PLAN:  [name] is a 55 year old Caucasian female  who presents with chest pressure at about 8 p.m. last night.  She expresses  this followed heavy lifting and moving boxes to a new address.  She has also  been under a lot of stress lately, her chest pain was not reproducible.  She  has had multiple cardiac catheterizations and also stress test in the last  couple of years for symptoms of recurrent chest pain with some bump in her  troponin, however her most recent cath in May 2006 was noted to be  unremarkable with patent stents.   Plan is to admit her at this time and rule her out  with serial cardiac  enzymes.  We will continue all of her home medications, she says she has  been compliant with her meds.  I will request cardiology to see her in the  morning for further evaluation.   She will be on telemetry. I  will start her on nitroglycerin infusion if her  chest pain recurs.      Margaretmary Dys, M.D. Electronically Signed     AM/MEDQ  D:  04/22/2005  T:   04/22/2005  Job:  [id number]

## 2010-12-20 NOTE — Discharge Summary (Signed)
Tracy, Cochran NO.:  [id number]   MEDICAL RECORD NO.:  [id number]          PATIENT TYPE:  INP   LOCATION:  3734                         FACILITY:  [facility]   PHYSICIAN:  Vida Roller, M.D.   DATE OF BIRTH:  March 15, 1956   DATE OF ADMISSION:  04/23/2005  DATE OF DISCHARGE:  04/25/2005                                 DISCHARGE SUMMARY   PRIMARY CARE PHYSICIAN:  Scott A. Gerda Diss, M.D.   CARDIOLOGIST:  Jonelle Sidle, M.D.   DISCHARGE DIAGNOSIS:  Atypical chest pain status post cardiac  catheterization; no significant stenosis, patent TAXUS stent, abdominal  aorta normal, renals two bilaterally were normal.  Cardiac enzymes negative  with a troponin of less than 0.01.   PAST MEDICAL HISTORY:  1.  Coronary artery disease status post revascularization of her left      anterior descending and diagonal coronary arteries in 2005 with a TAXUS      stent.  2.  Multiple evaluations for recurrent chest discomfort since that time      including a cardiac catheterization done in June 2006, which showed      widely patent stents and no progression of her coronary artery disease      with normal LV function.  3.  History of fibromyalgia.  4.  History of hypothyroidism.  5.  History of dyslipidemia.  6.  Obesity.  7.  History of major depression with previous suicide attempts.  8.  History of gastroesophageal reflux disease.   PAST SURGICAL HISTORY:  The past surgical history includes:  1.  Tubal ligation.  2.  Surgery on her left foot.  3.  History of tobacco abuse; quit two years ago.   FAMILY HISTORY:  There is a family history of coronary artery disease.   ALLERGIES:  No known drug allergies.   HOSPITAL COURSE:  [name] is a 55 year old Caucasian female with known  history of coronary artery disease who has had recurrent episodes of  atypical chest pain status post cardiac catheterization in June 2006 showing  patent stents with no progression of her  disease who presented on this  admission to Dr. Fletcher Anon office, her primary care physician, with pain  similar to her previous heart attack.  However, the patient had been moving  and lifting some boxes.  It was decided to admit the patient to rule out MI.   Troponin was less than 0.01 times three.  We proceeded with cardiac  catheterization to reevaluate the patient's stents.   The patient was transferred from [facility] to [facility] for  cardiac catheterization.  The patient was taken to the cath lab on April 24, 2005 by Dr. Randa Evens with the results as stated above.  The  patient tolerated the procedure without complications. She is being  discharged home after being seen by Dr. Dorethea Clan on the day of discharge. The  patient is afebrile, blood pressure was 120/60 and sat 98% on room air.  The  patient was started on lisinopril secondary to a decreased EF of 56% with  anterolateral akinesis found on cardiac  catheterization.   Lab work this admission:  Chemistries showed a potassium of  3.2, which was  treated, BUN 7 and creatinine 0.9.  Hemoglobin 13.5 and hematocrit 3.6.  Results of the lipid panel are total cholesterol 140, triglycerides 117, HDL  52 and LDL 75.   DISPOSITION:  The patient is being discharged to home.   FOLLOW UP:  The patient is to follow up with Dr. Diona Browner on May 19, 2005 at 1 P.M. at which time she will also have a BMET drawn with the  addition of ACE inhibitor added to her medical regimen.   DISCHARGE INSTRUCTIONS:   DIET:  Low-fat, low-salt, low-cholesterol.   ACTIVITY:  Increase activities slowly.  The patient may shower; no tub  bathing times two days.  No driving for two days.  No lifting over 10 pounds  times four weeks.  No sexual activity for one week.   DISCHARGE MEDICATIONS:  Medications include:  1.  A new medication prescription for lisinopril 10 mg p.o. daily with 11      refills.  2.  Lipitor has been increased to  80 mg daily.  3.  Plavix 75 mg daily.  4.  Aspirin 325 mg daily.  5.  Synthroid 125 mcg daily.  6.  Prozac 60 mg daily or as instructed by Dr. Gerda Diss.   I have instructed the patient to stop taking her Imdur.  She is to follow up  with Dr. Diona Browner as stated above.  She needs lipid and liver panels drawn  in four to six weeks; she can have this done at the [place] office.  The  patient is to call our office for any fever, any pain or swelling at the  cath site.   Duration of discharge preparation; 20 minutes.      Dorian Pod, NP      Vida Roller, M.D.  Electronically Signed    MB/MEDQ  D:  04/25/2005  T:  04/25/2005  Job:  [id number]   cc:   Jonelle Sidle, M.D. [facility]  [address]  [place]  [place] [address]   Lorin Picket A. Gerda Diss, MD  Fax: [phone]

## 2010-12-20 NOTE — H&P (Signed)
[name], [name]             ACCOUNT NO.:  [id number]   MEDICAL RECORD NO.:  [id number]          PATIENT TYPE:  AMB   LOCATION:  DAY                           FACILITY:  [facility]   PHYSICIAN:  Vickki Hearing, M.D.DATE OF BIRTH:  Apr 09, 1956   DATE OF ADMISSION:  DATE OF DISCHARGE:  LH                                HISTORY & PHYSICAL   REFERRED BY:  Scott A. Luking, MD.   CHIEF COMPLAINT:  Numbness and weakness of both hands.   HISTORY:  This is a 55 year old female who has hypothyroidism but is being  treated with Synthroid and presents with a long history of carpal tunnel  syndrome which was treated with bracing approximately 20 years ago for mild  symptoms and in the last six months this has really gotten bad, left greater  than right. She had a nerve conduction study done on Dec 03, 2005 that  indicated left carpal tunnel syndrome, borderline right done by Dr. Huston Foley.  Her symptoms are that her hands go numb with all of activities of daily  living including driving, riding her motorcycle, talking on the telephone,  she has trouble picking up things of holding things and has complaints of  weakness. She has 24 hour a day numbness which is worsened by those  activities.   REVIEW OF SYSTEMS:  Consistent with a history of weight gain, fatigue, chest  pain, heart attack October 27, 2003, angina, kidney stone, numbness, weakness,  joints, pain and swelling, fibromyalgia, thyroid disease, depression, panic  attack, skin, ENT, immunologic, lymph systems negative by report from the  patient. GI, breathing syndromes negative.   ALLERGIES:  Allergies to no medications.   PROBLEM:  Fibromyalgia, hypothyroidism, coronary artery disease.   PAST SURGICAL HISTORY:  Tubal ligation in 1981, foot surgery in 1992,  cardiac catheterization with stent placement March of 2005.   MEDICATIONS:  Lipitor and Plavix, asked to stop 7 days before surgery along  with aspirin, also asked to stop 7  days before surgery. Prozac, Xanax,  lisinopril, Synthroid.   FAMILY HISTORY:  Heart disease, arthritis.   The patient is disabled due to myocardial infarction and fibromyalgia. She  is divorced, does not smoke or drink. Caffeine use, 3 cups of coffee per  day. Highest grade completed, 2 year Associate's degree.   VITAL SIGNS:  Weight 220, pulse 84, respiratory rate 16.  GENERAL:  The patient is well-developed and well-nourished. Grooming and  hygiene are normal. Body habitus is endomorphic.  CARDIOVASCULAR EXAM OF THE UPPER EXTREMITY:  Show normal pulse and  perfusion, temperature changes none. No swelling or edema.  SKIN:  There are no skin abnormalities.  NEUROLOGIC:  Shows that she has intact pressure sensation, decreased sharp  and soft touch in the median nerve distribution. Reflexes were normal. She  was awake, alert and oriented x3. Mood was pleasant.   She had no lymphadenopathy and the musculoskeletal findings showed normal  gait and station. Upper extremities showed normal range of motion, good  power, grip, strength in the office. This was not tested with a dynamometer.  Appearance of the hands  were normal. Range of motion was excellent.  Alignment was good and the wrist joints were stable. Provocative tests were  positive including the Tinel's test and the flexion wrist test.   IMPRESSION:  Bilateral carpal tunnel release.   PLAN:  Left carpal tunnel release, 3-4 weeks later right carpal tunnel  release.   Diagnosis code, 354.0.      Vickki Hearing, M.D.  Electronically Signed     SEH/MEDQ  D:  01/07/2006  T:  01/07/2006  Job:  [id number]   cc:   Lorin Picket A. Gerda Diss, MD  Fax: [phone]

## 2010-12-20 NOTE — Procedures (Signed)
NAMEBRITTANYANN, WITTNER             ACCOUNT NO.:  [id number]   MEDICAL RECORD NO.:  [id number]          PATIENT TYPE:  OBV   LOCATION:  A222                          FACILITY:  [facility]   PHYSICIAN:  Vida Roller, M.D.   DATE OF BIRTH:  [birth year]   DATE OF PROCEDURE:  04/23/2005  DATE OF DISCHARGE:                                    STRESS TEST   Patient's bra size is 40DD.   BRIEF HISTORY:  The patient is a 55 year old female with previous history of  coronary artery disease status post revascularization at the LAD and  diagonal in 2005 with a Taxus stent that has presented to the hospital with  chest pain after she was moving.  Pain was atypical, so it was decided to do  a stress test to rule out ischemia.  Test performed was exercise Myoview.  The PA performing test was April Humphrey, NP.  Indication was chest pain.  The patient has symptoms of shortness of breath during the procedure.   ARRHYTHMIA:  None noted.   REASON FOR STOPPING THE TEST:  The patient achieved the target heart rate  and the test ended.  Resting heart rate was 76.  Resting blood pressure was  138/78, target heart rate was 145, maximum projected heart rate was 170 with  maximum percentage heart rate of 95.  Worst case ST slope was 1.  Worst case  ST depression was negative 2.0, maximum heart rate was 161.  Maximum  systolic blood pressure was 148/88.  Total exercise time was 5 minutes and  37 seconds with 5 mets achieved.  ST depressions in lead 2, 3 and ABS and  lead V6.   FINAL RESULTS:  Pending Cardiology M.D. review.     ______________________________  April Humphrey, NP      Vida Roller, M.D.  Electronically Signed    AH/MEDQ  D:  04/23/2005  T:  04/24/2005  Job:  [id number]

## 2010-12-20 NOTE — Assessment & Plan Note (Signed)
[facility]                            [facility] OFFICE NOTE   [name], [name]                    MRN:          [id number]  DATE:05/11/2006                            DOB:          11-05-1955    PRIMARY CARDIOLOGIST:  Dr. Simona Huh   REASON FOR OFFICE VISIT:  Post-hospital follow-up.   [name] is a 55 year old female with known coronary artery disease,  recently transferred directly from [facility] ER to [facility] for  further evaluation for chest pain. Although her initial cardiac markers here  were negative, follow-up enzymes were abnormal (peak troponin 1.8) and she  was referred for cardiac catheterization. Of note, the patient has had  multiple cardiac catheterizations since undergoing prior Taxus stenting of  the LAD and diagonal in March 2005, revealing nonobstructive coronary  disease.   Cardiac catheterization was performed by Dr. Charlies Constable, who again noted  continued wide patency of both stents to the LAD/diagonal branch  bifurcation, with residual nonobstructive coronary artery disease. Left  ventricular function was preserved (EF 60%) with very mild anterolateral  wall hyperkinesis.   The patient was discharged with the addition of beta-blocker through her  previous medication regimen.   Since discharge, the patient has not had any recurrent episode of chest  discomfort. Briefly, she tells me today that this was a singular episode  which occurred at rest. However, it was identical to an episode that  occurred exactly one year earlier, again resulting in elevated cardiac  markers, but with no associated coronary artery disease by catheterization.  She also tells me today that her chest pain was not responsive to  nitroglycerin.   The patient denies any complications of her right groin incision site.   Also, the patient informs me today that she recently found out that she had  not been taking  lisinopril for the past month or so. She found an empty vial  of this medication and had not had it resumed prior to her most recent  presentation.   CURRENT MEDICATIONS:  1. Lopressor 12.5 mg b.i.d.  2. Lipitor 40 mg q.d.  3. Plavix 75 mg q.d.  4. Synthroid 0.125 mg q.d.  5. Prozac 60 mg q.d.  6. Aspirin 325 mg q.d.  7. Protonix 40 mg q.d.   PHYSICAL EXAMINATION:  VITAL SIGNS:  Blood pressure: 118/60. Pulse: 72,  regular. Weight: 228.8.  NECK: Palpable carotid pulses without bruits.  LUNGS:  Clear to auscultation all fields.  HEART: Regular rate and rhythm, S1, S2. Positive S4. No significant murmurs.  EXTREMITIES: Right groin stable with no ecchymosis or hematoma.  Palpable  femoral pulse without bruit. Intact distal pulses with trace pedal edema.   IMPRESSION:  1. Coronary artery disease.      a.     Nonobstructive coronary artery disease with continued wide       patency of the left anterior descending/diagonal branch bifurcation       site by recent cardiac catheterization.      b.     Abnormal cardiac enzymes (question transient vasospasm versus  ruptured plaque).      c.     Status-post anterior myocardial infarction/Taxus stenting, left       anterior descending/diagonal branch bifurcation March 2006.      d.     Subsequent multiple cardiac catheterizations for recurrent chest       pain.      e.     Normal left ventricular function.  2. Gastroesophageal reflux disease.  3. Fibromyalgia.  4. Dyslipidemia. Followed by Dr. Gerda Diss.  5. History of medication noncompliance.  6. History of major depression. Previous suicide attempts.   PLAN:  1. Continue current medication regimen. I recommended that she not be      placed back on an ACE inhibitor at this time given that her blood      pressure is currently low-normal. Additionally, she has normal left      ventricular function, and at this point in time, I prefer to have the      beta-blocker on board, which was  recently added, given her history of      myocardial infarction and recent elevation of her cardiac markers.  2. Schedule return clinic follow-up with Dr. Simona Huh in approximately      three months for close monitoring or for any recurrent angina.  If, in      fact, her chest pains may be related to transient coronary vasospasm,      then we may need to consider the addition of a long-acting oral nitrate      and/or Norvasc.  3. Aggressive lipid management with LDL goal of 70 or less. Will defer to      Dr. [name] for continued management.      ______________________________  Rozell Searing, PA-C    ______________________________  Learta Codding, MD,FACC    GS/MedQ  DD:  05/11/2006  DT:  05/12/2006  Job #:  [id number]   cc:   Lorin Picket A. Gerda Diss, MD

## 2010-12-20 NOTE — Cardiovascular Report (Signed)
[name], [name]             ACCOUNT NO.:  [id number]   MEDICAL RECORD NO.:  [id number]          PATIENT TYPE:  INP   LOCATION:  2922                         FACILITY:  [facility]   PHYSICIAN:  Bruce R. Juanda Chance, MD, FACCDATE OF BIRTH:  [birth year]   DATE OF PROCEDURE:  04/23/2006  DATE OF DISCHARGE:  04/23/2006                              CARDIAC CATHETERIZATION   CLINICAL HISTORY:  [name] is 55 year old and had an anterior wall  infarction in March of 2005 treated with PCI of a bifurcation lesion of the  LAD using a crush technique with two Taxus stents by Dr. Samule Ohm.  She has  had four caths since that time with no evidence of narrowing.  Her last cath  was a year ago.  Yesterday, she developed substernal chest pain and was seen  at [facility] and transferred here for further evaluation.  She has a history  of contrast allergy and was pretreated with steroids and Benadryl.   PROCEDURE:  The procedure was performed by the right femoral artery.  We had  difficulty with access and had to use a Doppler needle to help with access.  We used 6-French catheters.  The pat tolerated the procedure well and left  the laboratory in satisfactory condition.  Right femoral artery was closed  with AngioSeal at the end of the procedure.   RESULTS:  The left main coronary artery:  The left main coronary artery was  free of disease.   Left anterior descending artery:  The left anterior descending artery gave  rise to a large diagonal branch and several septal perforators.  There was a  bifurcation stent in the LAD and the diagonal branch which was widely patent  with 0% stenosis in both limbs.  The LAD was a very small caliber vessel  that did not reach the apex and was diffusely irregular.  The diagonal  branch was a much larger vessel in the LAD and gave rise to a subbranch.  This vessel was irregular.  There was no major obstruction.  This vessel  reached to the apex.   The circumflex  artery:  The circumflex artery gave rise to a marginal branch  and two posterolateral branches.  This vessel was irregular.  There was a  30% narrowing before the first of two posterolateral branches.   The right coronary artery:  The right coronary artery was a moderate-sized  vessel, gave rise to a right ventricular branch and posterior descending  branch.  There was 30% narrowing in the mid-right coronary before the right  ventricular branch.   LEFT VENTRICULOGRAM:  Left ventriculogram performed in the RAO projection  showed slight hypokinesis of the anterolateral wall.  The overall wall  motion was good and the estimated fraction was 60%.   The aortic pressure was 125/80 with a mean of 101 and the left ventricular  pressure was 125/25.   CONCLUSION:  Coronary artery disease status post prior anterior wall  myocardial infarction treated with a bifurcation stenting of the left  anterior descending artery using drug-eluting stents with 0% stenosis at the  two stents in the  proximal left anterior descending artery, irregularities  in the large diagonal branch and a small native left anterior descending  artery, 30% narrowing in the midcircumflex artery and 30% narrowing in the  mid-right coronary artery with very mild anterolateral wall hypokinesis and  an estimated fraction of 60%.   RECOMMENDATIONS:  Both stents are widely patent and the patient has only  nonobstructive coronary disease.  Her LV function is also good.  Will plan  reassurance and continued secondary risk factor modification.           ______________________________  Everardo Beals Juanda Chance, MD, [facility]     BRB/MEDQ  D:  04/23/2006  T:  04/25/2006  Job:  [id number]   cc:   Lorin Picket A. Gerda Diss, MD  Jonelle Sidle, MD  Cardiopulmonary Lab

## 2010-12-20 NOTE — Op Note (Signed)
[facility]  Patient:    [name] [facility] S Visit Number: [id number] MRN: [id number]          Service Type: DSU Location: [facility] Attending Physician:  Osborn Coho Dictated by:   Janeece Riggers Dareen Piano, M.D. Proc. Date: 01/24/02 Admit Date:  01/24/2002                             Operative Report  PREOPERATIVE DIAGNOSIS:       Menorrhagia.  POSTOPERATIVE DIAGNOSIS:      Menorrhagia.  OPERATION:                    1. Dilation curettage.                               2. Cryoablation of endometrial cavity.  SURGEON:                      Mark E. Dareen Piano, M.D.  ANESTHESIA:                   General.  ANTIBIOTICS:                  Ancef 1 g.  ESTIMATED BLOOD LOSS:         Minimal.  COMPLICATIONS:                None.  SPECIMENS:                    Endometrial curettings, sent to pathology.  DESCRIPTION OF PROCEDURE:     The patient was taken to the operating room where she was placed in a dorsal supine position. A general anesthetic was administered without complications. She was then placed in the dorsal lithotomy position and prepped with Hibiclens. She was draped in the usual fashion for this procedure. A sterile speculum was placed in the vagina. A single-tooth tenaculum was placed on the cervix. The cervical os was then serially dilated to a 31 [place]. The uterus was sounded at 10 cm. Sharp curettage was then performed on the endometrial cavity. Following this, the cryoablation was set up and put through its warm up. Once the machine was ready, a probe was placed in the right cornua and a seven minute freeze was performed. A similar freeze was performed in the opposite cornua. Following this, a four minute freeze was performed on lower uterine segment. The patient tolerated the procedure well. She was discharged to home. She was instructed to follow up at the office in four weeks. She was sent home, instructed to take Advil p.r.n. Dictated  by:   Janeece Riggers Dareen Piano, M.D. Attending Physician:  Osborn Coho DD:  01/24/02 TD:  01/24/02 Job: [id number]

## 2010-12-20 NOTE — Cardiovascular Report (Signed)
NAMEBruna Cochran                            ACCOUNT NO.:  [id number]   MEDICAL RECORD NO.:  [id number]                   PATIENT TYPE:  INP   LOCATION:  2907                                 FACILITY:  [facility]   PHYSICIAN:  Charlton Haws, M.D.                  DATE OF BIRTH:  October 29, 1955   DATE OF PROCEDURE:  DATE OF DISCHARGE:  04/13/2004                              CARDIAC CATHETERIZATION   PROCEDURE:  Coronary angiography.   INDICATIONS:  Previous anterior MI with stents to the LAD and digoxin,  recurrent chest pain.   PROCEDURE:  Standard catheterization was done from the right femoral artery.  The patient's initial blood pressure was in the 85 systolic range.  Despite  this and the previous problems cannulating the groin during her last  catheterization, we were able to enter the right femoral artery without  difficulty.   ANGIOGRAPHY:  The left main coronary artery had 20% discreet stenosis.   The left anterior descending artery was small.  The diagonal branch was  large and branching and actually supplied more of the lateral wall with a  stent in the proximal to mid LAD was widely patent.  The first diagonal  branch was a large branching artery with widely patent stents.   Circumflex coronary artery had 20% multi discreet lesions in the mid and  distal vessel.   The right coronary artery was dominant.  It was normal.   Aortic pressure was 130/72.  LV pressure was 130/10.   RAO ventriculography showed mild anterior apical wall hypokinesis.  The EF  was in the 45-50% range.  There was no mural apical thrombus.  There was no  MR.   IMPRESSION:  The patient's stents are widely patent.  She appears to have  had some significant improvement in left ventricular function.  Given the  late hour of the day she will probably be monitored overnight and discharged  in the morning with continued medical therapy.                                               Charlton Haws,  M.D.    PN/MEDQ  D:  04/12/2004  T:  04/13/2004  Job:  [id number]

## 2010-12-20 NOTE — Discharge Summary (Signed)
NAMEBruna Cochran                            ACCOUNT NO.:  [id number]   MEDICAL RECORD NO.:  [id number]                   PATIENT TYPE:  INP   LOCATION:  2040                                 FACILITY:  [facility]   PHYSICIAN:  Arturo Morton. Riley Kill, M.D. [facility]         DATE OF BIRTH:  1955-11-18   DATE OF ADMISSION:  01/31/2004  DATE OF DISCHARGE:  02/07/2004                           DISCHARGE SUMMARY - REFERRING   DISCHARGE DIAGNOSES:  1.  Chest pain, resolved.  2.  Nonobstructive coronary artery disease.  3.  Hypothyroidism, treated.  4.  Long term Coumadin therapy.  5.  Dyslipidemia, treated.  6.  Obesity.  7.  Fibromyalgia.   HOSPITAL COURSE:  [name] is a 55 year old female who experienced an  anterior myocardial infarction in March 2005.  It was treated with  percutaneous intervention at that time.  Her EF was 45% and she was  anticoagulated for apical akinesis.   Two weeks prior to this admission she began having fatigue, dyspnea, and  substernal chest pain.  She was admitted to [facility] on January 31, 2004 and ruled out for myocardial infarction.  Her Coumadin was held until  her INR was less than 1.7, and then she was taken to the cardiac  catheterization lab.   Cardiac catheterization occurred on February 06, 2004 and she was found to have  the following.  No restenosis in the diagonal or main vessel.  There was  mild narrowing of the LAD at 40%.  At this point we felt that she needed to  have followup with a stress Cardiolite in the office to assure there was no  ischemia in the LAD distribution.   A 2D echo was performed to evaluate for LV thrombus.  None was visualized  per Dr. Myrtis Ser, and her Coumadin was discontinued.   Her TSH was 0.096 and her Synthroid was decreased during this admission.  Her other laboratory studies were unremarkable.  Chest x-ray -- no acute  disease.   The patient was discharged to home on these medications -- Synthroid 125 mcg  a day.   She is to stop her Coumadin.  She is to resume her other home  medications as prior to admission which include aspirin, Plavix, Prozac 40  mg a day, Altace 2.5 mg a day, Toprol XL 50 mg a day, and Xanax as needed.  She may utilize sublingual nitroglycerin as needed for chest pain.  No  straining or lifting over 10 pounds for one week.  Remain on a low fat diet.  Clean her  cath site with soap and water, no scrubbing.  Call for questions or  concerns.  [facility] will call the patient for followup appointment  and she needs to set up an appointment with Dr. Gerda Diss to have her thyroid  checked in approximately six weeks.      Guy Franco, P.A. [facility]  Arturo Morton. Riley Kill, M.D. [facility]    LB/MEDQ  D:  04/03/2004  T:  04/03/2004  Job:  [id number]   cc:   Rozell Searing, MD  [address]  [place]  [place]   Scott A. Gerda Diss, M.D.  [address], [address]  [place]  [place] [address]  Fax: [phone]

## 2010-12-20 NOTE — Discharge Summary (Signed)
[name], [name]             ACCOUNT NO.:  [id number]   MEDICAL RECORD NO.:  [id number]          PATIENT TYPE:  INP   LOCATION:  2922                         FACILITY:  [facility]   PHYSICIAN:  Bruce R. Juanda Chance, MD, FACCDATE OF BIRTH:  Feb 09, 1956   DATE OF ADMISSION:  04/23/2006  DATE OF DISCHARGE:  04/23/2006                                 DISCHARGE SUMMARY   DATE OF BIRTH:  1956/05/29   DATE OF ADMISSION:  April 23, 2006   DATE OF DISCHARGE:  April 23, 2006   PRIMARY CARDIOLOGIST:  Dr. Nona Dell in [place].   PRIMARY CARE PHYSICIAN:  Dr. [name].   PRINCIPAL DIAGNOSIS:  Non-ST elevation myocardial infarction/coronary artery  disease.   SECONDARY DIAGNOSES:  1. Fibromyalgia.  2. Hypothyroidism.  3. Dyslipidemia.  4. History of major depression with previous suicide attempts.  5. Gastroesophageal reflux disease.  6. Obesity.  7. History of medication nonadherence secondary to financial constraints.  8. Ongoing tobacco abuse.  9. Ongoing marijuana use.   ALLERGIES:  NO KNOWN DRUG ALLERGIES.   PROCEDURES:  Left heart cardiac catheterization.   HISTORY OF PRESENT ILLNESS:  A 55 year old white female with prior history  of CAD, status post stent placement in March of 2005 with Taxus drug-eluting  stent placement in the LAD and diagonal.  Since then she has had multiple  catheterizations in June and September of 2005, as well as May and September  of 2006, all of which revealing no new obstructive coronary disease.  She  was in her usual state of health until approximately 10 p.m. on April 22, 2006, when she developed 8/10 retrosternal chest pain with radiation to  her neck and back, similar to previous episodes of chest pain associated  with nausea, diaphoresis, and vomiting x1.  She took 2 sublingual  nitroglycerin without relief and then presented to [facility] ED, where she  was placed on intravenous nitroglycerin infusion with resolution of  chest  pain.  Her EKG showed no acute ST-T changes and her cardiac markers at  [facility] were negative x1.  She was subsequently transferred to [facility]  for additional evaluation.   HOSPITAL COURSE:  Following admission to [facility], [name] bumped her  cardiac markers with a CK of 138 and MB of 15.4, and a troponin I of 1.81.  She underwent left heart cardiac catheterization this afternoon, which  revealed patent LAD and diagonal stents with otherwise minor irregularities  and nonobstructive coronary disease.  Her EF was 60% with anterior  hypokinesis.  We have added beta-blocker to her already very good medication  regimen, including aspirin, statin, and Plavix therapy.  She has not had any  additional chest discomfort and has been ambulating post catheterization  without limitation.  She will be discharged home this evening in  satisfactory condition.   DISCHARGE LABS:  Hemoglobin 11.8, hematocrit 34.8, WBC 11.6, platelets 300,  MCV 91.7, sodium 141, potassium 4.0, chloride 108, CO2 27, BUN 8, creatinine  0.8, glucose 115.  PT 14.5, INR 1.1, PTT 36, total bilirubin 0.7, alkaline  phosphatase 89, AST 28,  ALT 20, albumin 3.0.  CK 138, MB 15.4, troponin I  1.81.  Total cholesterol 151, triglycerides 115, HDL 41, LDL 87.  Calcium  8.8, TSH is currently pending.   DISPOSITION:  The patient is being discharged home today in good condition.   FOLLOWUP PLANS AND APPOINTMENTS:  She has a followup appointment with Dr.  Nona Dell on May 07, 2006, at 2 p.m.  She is asked to follow up  with primary care physician, Dr. [name], in 3-4 weeks.   DISCHARGE MEDICATIONS:  1. Aspirin 81 mg daily.  2. Plavix 75 mg daily.  3. Lipitor 40 mg daily.  4. Prozac 60 mg daily.  5. Xanax 0.25 mg t.i.d.  6. Synthroid 125 mcg daily.  7. Nitroglycerin 0.4 mg sublingual p.r.n. chest pain.  8. Lopressor 25 mg half a tablet b.i.d.   OUTSTANDING LAB STUDIES:  None.   DURATION DISCHARGE  ENCOUNTER:  35 minutes including physician time.     ______________________________  Nicolasa Ducking, ANP    ______________________________  Everardo Beals. Juanda Chance, MD, [facility]    CB/MEDQ  D:  04/23/2006  T:  04/23/2006  Job:  [id number]   cc:   Lorin Picket A. Gerda Diss, MD

## 2010-12-20 NOTE — H&P (Signed)
NAMEBruna Cochran                            ACCOUNT NO.:  [id number]   MEDICAL RECORD NO.:  [id number]                   PATIENT TYPE:  INP   LOCATION:  2907                                 FACILITY:  [facility]   PHYSICIAN:  Rollene Rotunda, M.D.                DATE OF BIRTH:  28-Nov-1955   DATE OF ADMISSION:  04/11/2004  DATE OF DISCHARGE:                                HISTORY & PHYSICAL   PRIMARY CARE PHYSICIAN:  Dr. Lorin Picket A. Luking.   CARDIOLOGIST:  Dr. Jonelle Sidle.   REASON FOR PRESENTATION:  Chest pain.   HISTORY OF PRESENT ILLNESS:  The patient is a pleasant 55 year old white  female with a history of an anterior myocardial infarction in March of 2005.  At that time, she had a complicated kissing balloon angioplasty to an LAD  and diagonal.  She required a balloon pump and had an EF of 45%.  She  returned in late June and had cardiac catheterization in early July.  This  was for similar chest discomfort.  She had no re-stenosis in the diagonal or  LAD.  There was mild 40% narrowing in the LAD.  Her previous anatomy had  included a first marginal with 40% ostial stenosis.  Of note, the patient  stopped taking Plavix a couple of weeks ago because of cost.   The patient was not having chest discomfort until today.  This began this  afternoon at rest.  It was a substernal pressure similar to her previous  angina.  It was 10/10.  She was short of breath.  She had some tightness in  her throat.  She had some shoulder discomfort as well bilaterally.  It was  much more intense than that in June.  It was not relieved with two  nitroglycerin at home.  She had some improvement with EMS and in [place]  [facility], and was pain-free by the time she got here.  Otherwise, the patient has  not been having any shortness of breath.  She does sleep on three pillows  since her heart attack.  She has not been having any PND, however.  She has  not been describing palpitations, presyncope, or  syncope.   PAST MEDICAL HISTORY:  1.  Coronary artery disease as described.  2.  Hypothyroidism.  3.  Hypolipidemia.  4.  Obesity.  5.  Fibromyalgia.   PAST SURGICAL HISTORY:  1.  Tubal ligation.  2.  Carotid surgery.   ALLERGIES:  None.   MEDICATIONS:  1.  Aspirin 81 mg daily.  2.  Prozac 40 mg daily.  3.  Synthroid 125 mcg daily.  4.  Altace 2.5 mg daily.  5.  Metoprolol 50 mg daily.  6.  Xanax.  7.  Plavix (the patient is not taking this).   SOCIAL HISTORY:  She lives with her boyfriend.  She has an adult child and  four grandchildren. She has not smoked since March.   FAMILY HISTORY:  Noncontributory for early coronary artery disease.   REVIEW OF SYSTEMS:  As stated in the HPI.  Positive for musculoskeletal pain  in her muscles, joints.  Positive for depression.  Negative for all other  systems.   PHYSICAL EXAMINATION:  GENERAL:  The patient is in no distress.  VITAL SIGNS:  Blood pressure 110/55, heart rate 50 and regular, afebrile.  HEENT:  Eyes are unremarkable.  Pupils are equal, round and reactive to  light.  Fundi not visualized.  Oral mucosa is unremarkable.  NECK:  No jugular venous distension.  Waveform within normal limits.  Carotid upstroke brisk and symmetric, no bruits or thyromegaly.  LYMPHATICS:  No cervical, axillary or inguinal adenopathy.  LUNGS:  Clear to auscultation bilaterally.  BACK:  No costovertebral angle tenderness.  CHEST:  Unremarkable.  HEART:  PMI is not displaced, sustained.  S1 and S2 within normal limits.  No S3, S4 or murmurs.  ABDOMEN:  Obese, positive bowel sounds, normal in frequency and pitch.  No  rebound, guarding or midline pulsatile mass.  No hepatomegaly or  splenomegaly.  SKIN:  No rash.  No nodules.  EXTREMITIES:  There are 2+ pulses.  No edema.  No cyanosis or clubbing.  NEUROLOGIC:  Oriented to person, place an time.  Cranial nerves II-XII are  grossly intact.  Motor grossly intact.   LABORATORY DATA:  WBC 9.5,  hemoglobin 13.4, platelets 346,000.  Glucose 133,  sodium 135, potassium 3.3, chloride 105.  CK 46.  MB 0.8.  Troponin less  than 0.01.   Chest x-ray, no report available.   EKG:  Sinus bradycardia, rate 47, axis within normal limits. Intervals  within normal limits.  Poor anterior R wave progression, no acute ST-T wave  changes, nonspecific T wave inversion in V2.   ASSESSMENT/PLAN:  1.  The patient chest discomfort is worrisome for unstable angina.  She did      have a complicated procedure.  At this point, I think we are going to      need to assume this is recurrence of stenosis of new ruptured plaque      until proven otherwise.  She will be treated with heparin and nitrates.      She will be continued on beta-blocker as her heart rate tolerates.  She      will have an elective cardiac catheterization or more urgent if she has      recurrent pain.  She will be restarted on Plavix.  2.  Potassium.  Her potassium was supplemented.  3.  Hypothyroidism.  She will be continued on Synthroid.  4.  Risk reduction.  She should be on a statin.  This should be started      before discharge.                                                Rollene Rotunda, M.D.    JH/MEDQ  D:  04/11/2004  T:  04/12/2004  Job:  [id number]   cc:   Jonelle Sidle, M.D. [facility]   Scott A. Gerda Diss, M.D.  [address]  [place]  [place] [address]  Fax: [phone]

## 2010-12-20 NOTE — Cardiovascular Report (Signed)
NAMEBruna Cochran                            ACCOUNT NO.:  [id number]   MEDICAL RECORD NO.:  [id number]                   PATIENT TYPE:  INP   LOCATION:  2040                                 FACILITY:  [facility]   PHYSICIAN:  Arturo Morton. Riley Kill, M.D. [facility]         DATE OF BIRTH:  [birth year]   DATE OF PROCEDURE:  02/06/2004  DATE OF DISCHARGE:                              CARDIAC CATHETERIZATION   PROCEDURES:  1. Left heart catheterization.  2. Selective coronary arteriography.  3. Selective left ventriculography.   CARDIOLOGIST:  Arturo Morton. Riley Kill, M.D.   INDICATIONS:  [name] is a 55 year old female who previously presented  with an anterior wall infarction.  She was treated with bifurcational  stenting of the left anterior descending artery.  Recently she has developed  some recurrent chest discomfort.  She has been on Coumadin because of  anterior wall akinesis for nearly 3-1/2 months.  The current study is done  to assess coronary anatomy.  Risks, benefits and alternatives were discussed with the patient in detail.   DESCRIPTION OF PROCEDURE:  The procedure was performed from the right  femoral artery using 6 French catheters.  She tolerated the procedure well.  There were no complications.  We did use a Smart needle to gain access to  the femoral artery as it was difficult to feel.   HEMODYNAMIC DATA:  1. Central aortic pressure 118/63.  Mean 88.  2. Left ventricle 111/14.  3. Proximal 4-5 mm AV aortic valve gradient which is probably artifactual.   ANGIOGRAPHIC DATA:  1. Ventriculography is performed in the RAO projection.  Overall systolic     function appeared to be reasonably well preserved.  Ejection fraction was     calculated at 62%, but there was a wall motion abnormality involving the     mid and distal anterolateral wall and apical segment.  It did, however,     improve from the previous study.  There did not appear to be significant     mitral regurgitation.  2. The left main coronary artery was free of critical disease.  3. The left anterior descending artery has about 20% narrowing proximally.     This is just before the bifurcation point of the two drug-eluting stents     that had been previously placed.  The main stent was placed in the left     anterior descending diagonal branch.  The diagonal itself was a very     large caliber vessel that has a main branch that goes out over the     anterolateral wall and a branch that parallels the apical left anterior     descending and a size that is about equal.  There was minimal tapered     narrowing of about 20% at the ostium of this vessel, but this does not     appear at all to be high grade.  The LAD  stent itself appears patent.     There is tapering of this stent distally without about 40% narrowing of     the distal most aspect.  Whether this is flow-limiting is unclear as the     distal vessel was only about a 2 mm vessel.  The distal LAD trails off     and is a very small caliber vessel distally.  Angiographically, it does     not appear to be flow-limiting.  4. The circumflex provides a first marginal branch with about 40% ostial     narrowing.  The second and third marginal branches are without critical     disease.  5. There is mild ostial spasm of the right coronary artery, but no evidence     of high grade stenosis.  The distal vessel consists of a posterior     descending and posterior lateral branch, all of which appear free of     critical disease.   CONCLUSIONS:  1. Some improvement in overall left ventricular function.  2. Continued patency of the major diagonal branch with continued patency of     the LAD with the above caveats as noted.  3. Mild to moderate ostial narrowing of the first obtuse marginal branch.   DISPOSITION:  I spoke with Dr. Samule Ohm.  My leaning is in the direction of  continued medical therapy.  I think that the patient should have an exercise  test so  that she can continue rehabilitation.  Finally, some consideration  might be given to discontinuing Coumadin at this time and reassessing her  echocardiogram.                                               Arturo Morton. Riley Kill, M.D. [facility]    TDS/MEDQ  D:  02/06/2004  T:  02/06/2004  Job:  [id number]   cc:   Lorin Picket A. Gerda Diss, M.D.  [address]  [place]  [place] [address]  Fax: [phone]   Jonelle Sidle, M.D. [facility]   CV Laboratory

## 2010-12-20 NOTE — Discharge Summary (Signed)
NAMEGrant Cochran                  ACCOUNT NO.:  [id number]   MEDICAL RECORD NO.:  [id number]          PATIENT TYPE:  INP   LOCATION:  2039                         FACILITY:  [facility]   PHYSICIAN:  Jonelle Sidle, M.D. LHCDATE OF BIRTH:  1956/05/16   DATE OF ADMISSION:  12/23/2004  DATE OF DISCHARGE:  12/25/2004                                 DISCHARGE SUMMARY   PROCEDURES:  1.  Cardiac catheterization.  2.  Coronary arteriogram.  3.  Left ventriculogram.   DISCHARGE DIAGNOSES:  1.  Chest pain, minimal enzymatic evidence of non-ST segment elevation      myocardial infarction, and nonobstructive coronary artery disease with      patent stents by cath. Possible transient plaque rupture or spasm, add      Imdur.  2.  Dyslipidemia, obtain fasting lipid profile prior to follow-up office      visit.  3.  Status post anterior myocardial infarction in March 2005 with      complicated kissing balloon angioplasty to the left anterior descending      and diagonal, intra-aortic balloon required.  4.  Hypothyroidism.  5.  Obesity.  6.  History of fibromyalgia.  7.  Status post bilateral tubal ligation.  8.  Family history of premature coronary artery disease.  9.  Gastroesophageal reflux disease.  10. History of severe major depression with the suicide attempt in the past.  11. History of noncompliance secondary to financial issues.  12. Remote history of tobacco.  13. Dye allergy.   HOSPITAL COURSE:  [name] is a 55 year old female with known coronary  artery disease. She had an MI in March 2005 and recurrent chest pain for  which she was cathed in September 2005, nonobstructive disease. She had  chest pain and went to [facility]. Her troponin was crescendo in  pattern starting on a 0.02 peaking at 0.54. Dr. Diona Browner evaluated her and  felt there was enzymatic evidence of a non-ST segment elevation MI. She was  transferred to [facility]. [facility] for further  evaluation.   Her CKs were negative at [facility]. [facility] but her troponin  was slightly elevated at 0.18. It was decided that cardiac cath was  indicated at this was performed on Dec 24, 2004.   The cardiac catheterization showed an EF estimated of 55% with mild akinesis  in the anterolateral wall. Previously placed drug eluting stents to the LAD  and diagonal was patent. She had nonobstructive disease.   A D-dimer was checked and was within normal limits at 0.39. Dr. Diona Browner  evaluated [name] on Dec 25, 2004, and she had no chest pain. She was  considered stable for discharge after potassium supplementation and with  outpatient follow-up arranged.   DISCHARGE INSTRUCTIONS:  She is to do no driving for two days and no  strenuous activity for week. She is to call the office for any problems with  the cath site. She is to stick to a low-fat diet. She is to follow up with  Dr. Diona Browner in January 16, 2005,  at 1:45 and with Dr. Gerda Diss as needed. She is  to get a fasting lipid profile prior to her appointment.   DISCHARGE MEDICATIONS:  1.  Aspirin 325 mg daily.  2.  Plavix 75 mg daily.  3.  Lipitor 40 mg daily.  4.  Prozac 40 mg daily.  5.  Synthroid 125 mcg daily.  6.  Xanax as prior to admission.  7.  Protonix 40 mg daily.  8.  Imdur 30 mg q.h.s..  9.  She is not to take metoprolol.      RB/MEDQ  D:  12/25/2004  T:  12/25/2004  Job:  [id number]   cc:   Dr. Shawnee Knapp in [facility]

## 2010-12-20 NOTE — Discharge Summary (Signed)
[facility]  Patient:    [name] Visit Number: [id number] MRN: [id number]          Service Type: MED Location: 2A A202 01 Attending Physician:  Cassell Smiles. Dictated by:   [name], M.D. Admit Date:  11/28/2001                             Discharge Summary  HISTORY:  Patient overall doing well today.  Denies any severe chest discomfort.  Some intermittent right arm discomfort.  No fever or shortness of breath.  Activity level good.  PAST MEDICAL HISTORY:  See previous notes.  PHYSICAL EXAMINATION  GENERAL:  NAD.  CHEST:  Lungs:  Clear.  Chest well nontender.  HEART:  Regular.  ASSESSMENT AND PLAN:  Atypical chest pain.  Patient was not comfortable with going home yesterday with the possibility of it being cardiac.  Will do the Cardiolite today and hopefully be able to send home later today if things are looking good.  If Cardiolite looks good will treat with anti-inflammatories for one to two weeks then we will follow up patient as an outpatient for further microcytic anemia which is felt to be due to menorrhagia.  Will do outpatient Hemoccult x3. Dictated by:   [name], M.D. Attending Physician:  Cassell Smiles DD:  11/30/01 TD:  11/30/01 Job: [id number]

## 2010-12-20 NOTE — Cardiovascular Report (Signed)
Tracy, Cochran             ACCOUNT NO.:  [id number]   MEDICAL RECORD NO.:  [id number]          PATIENT TYPE:  INP   LOCATION:  3734                         FACILITY:  [facility]   PHYSICIAN:  Salvadore Farber, M.D. LHCDATE OF BIRTH:  [birth year]   DATE OF PROCEDURE:  04/24/2005  DATE OF DISCHARGE:  04/25/2005                              CARDIAC CATHETERIZATION   PROCEDURE:  Left heart catheterization, left ventriculography, coronary  angiography, abdominal aortography.   CARDIOLOGIST:  Salvadore Farber, M.D.   INDICATIONS FOR PROCEDURE:  [name] is a 55 year old lady who suffered  an anterior myocardial infarction in March 2005.  Her LAD and diagonal were  treated with crushed drug-eluding stents with the crush favoring the large  diagonal.  Since then, she has had multiple catheterizations for atypical  chest pain.  She returns today for the same.  A recent stress test suggested  anterolateral infarction with mild peri-infarction ischemia.   DESCRIPTION OF PROCEDURE:  An informed consent was obtained.  Under 1%  lidocaine local anesthesia, a 5-French sheath was placed in the right common  femoral artery using the modified Seldinger technique.  A diagnostic  angiography and ventriculography were performed using JL4, JR4 and pigtail  catheters.  The pigtail catheter was then pulled back to the supra-renal  abdominal aorta.  An abdominal aortography was performed by power injection.  The catheters were then removed over a wire.  The patient was transferred to  the holding room in stable condition.  The sheath will be removed there.   COMPLICATIONS:  None.   FINDINGS:  LV:  113/10/20.  Ejection fraction:  56% with anterolateral akinesis.  No aortic stenosis or mitral regurgitation.   RESULTS:  1.  LEFT MAIN CORONARY ARTERY:  The left main coronary artery is      angiographically normal.  2.  LEFT ANTERIOR DESCENDING CORONARY ARTERY:  The left anterior descending  coronary artery gives rise to a single large diagonal.  The diagonal is      larger than the LAD itself.  Distal to the takeoff of the diagonal, the      LAD is quite small.  The stents in the proximal LAD extending both into      the diagonal and LAD are widely patent without any evidence of      restenosis.  3.  CIRCUMFLEX CORONARY ARTERY:  The circumflex coronary artery is a      moderate-sized vessel giving rise to three obtuse marginals.  It is      angiographically normal.  4.  RIGHT CORONARY ARTERY:  The right coronary artery is a moderate-sized      dominant vessel.  There are minor luminal irregularities along its      course.  5.  ABDOMINAL AORTA:  The abdominal aorta is angiographically normal, with      minimal plaquing and no evidence of aneurysm.  6.  RENAL ARTERIES:  There are two renal arteries bilaterally.  They are      roughly equal in size and all are angiographically normal.   IMPRESSION/PLAN:  There are no significant  coronary stenoses.  The ejection  fraction is overall mildly impaired, though she has a large area of  anterolateral akinesis.  I think her chest discomforts are not due to  myocardial ischemia, though with her elevated left ventricular end diastolic  pressure, subendocardial ischemia is difficult to exclude.  In any case,  will manage medically with multiple changes in her medical regimen.  Specifically, I will discontinue her Imdur, as she has no stenosis to  warrant chronic nitrate therapy.  Will add Lisinopril 10 mg daily.  She will  continue on both aspirin and Plavix, which both should be continued for the  duration of her life, given the crushed drug-eluding stents in the left  anterior descending and diagonal.   She will follow up with Dr. Jonelle Sidle.  I would recommend up-  titration of the Lisinopril and initiation of beta blocker as blood pressure  allows.      Salvadore Farber, M.D. [facility]  Electronically Signed      WED/MEDQ  D:  04/24/2005  T:  04/25/2005  Job:  [id number]   cc:   Lorin Picket A. Gerda Diss, MD  Fax: [phone]   Jonelle Sidle, M.D. [facility]  [address]  [place]  [place] [address]

## 2010-12-20 NOTE — H&P (Signed)
NAMEBruna Cochran                            ACCOUNT NO.:  [id number]   MEDICAL RECORD NO.:  [id number]                   PATIENT TYPE:  INP   LOCATION:  2922                                 FACILITY:  [facility]   PHYSICIAN:  Salvadore Farber, M.D.             DATE OF BIRTH:  [birth year]   DATE OF ADMISSION:  10/27/2003  DATE OF DISCHARGE:                                HISTORY & PHYSICAL   CHIEF COMPLAINT:  Chest pain.   HISTORY OF PRESENT ILLNESS:  Miss [name] is a 55 year old female with no  previous history of coronary artery disease who had an episode of substernal  chest pain lasting approximately 45 minutes yesterday accompanied with  nausea, vomiting, and dyspnea.  This spontaneously resolved.  She then went  to bed, and this morning awoke around 4:00 a.m. with substernal chest pain.  She went on to work and continued to have chest pain.  The EMS was summoned.  An EKG did reveal lateral ST segment elevation with inferior lead  depression.  In the emergency room, she was given Plavix 600 mg plus  aspirin, 5 mg of IV Lopressor, IV heparin, as well as IV nitroglycerin.  She  was then taken to the Cath Lab emergently.   ALLERGIES:  No known drug allergies.   MEDICATIONS:  1. Xanax.  2. Prozac 40 mg a day.  3. Synthroid 175 mcg every other day alternated by half that dose on the     other days.   PAST MEDICAL HISTORY:  1. Status post tubal ligation.  2. Status post cryo surgery.  3. History of hypothyroidism treated.  4. History of fibromyalgia.   SOCIAL HISTORY:  Lives in [place].  She is single but does have a boyfriend.  She does have children.  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  She does  smoke a half a pack a day.  No illicit drugs or alcohol use.  No specific  diet, carrying no formalities.  No specific exercise regimen.   FAMILY HISTORY:  There is a family history of coronary artery disease in  both parents in their 17s.   REVIEW OF SYSTEMS:  Complaint of chest  pain, shortness of breath, nausea,  vomiting.  Otherwise, all systems are negative.   PHYSICAL EXAMINATION:  VITAL SIGNS:  Pulse 72.  Respirations 17.  Blood  pressure 127/67.  02 saturations 99% on 2 liters.  GENERAL:  She is in mild distress complaining of 5/10 chest pain.  HEENT:  Grossly normal.  No gross abnormalities.  NECK:  No carotid or subclavian bruits.  No JVD or thyromegaly.  CHEST:  Clear to auscultation bilaterally.  No wheezing or rhonchi.  HEART:  Regular rate and rhythm.  No gross murmur, rub, or ectopy.  ABDOMEN:  Good bowel sounds.  Obese.  Nontender.  Nondistended.  No masses,  no bruits. No femoral bruits.  EXTREMITIES:  Lower extremities:  No peripheral edema.  Palpable pedal  pulses.  NEURO:  Cranial nerves 2-12 grossly intact.   CHEST X-RAY:  A mild vascular prominence.   EKG:  A rate of 57, sinus rhythm with inferior ST depression of 4 mm with ST  segment elevation in V1 and L of 3-4 mm.   LAB STUDIES:  A hemoglobin of 15.3, hematocrit of 45, potassium 3.5, BUN 10,  creatinine of 0.5.   ASSESSMENT/PLAN:  1. Acute anterior myocardial infarction.  2. Hypothyroidism, treated.  3. Fibromyalgia.   The patient was taken to the Cath Lab emergently. The patient has been seen  and examined by Dr. Salvadore Farber.      Guy Franco, P.A. [facility]                      Salvadore Farber, M.D.    LB/MEDQ  D:  10/27/2003  T:  10/28/2003  Job:  [id number]   cc:   Lorin Picket A. Gerda Diss, M.D.  [address]  [place]  [place] [address]  Fax: [phone]   Salvadore Farber, M.D.  [phone] [address]  [address]  [place]  [place] [address]

## 2010-12-20 NOTE — Consult Note (Signed)
[facility]  Patient:    [name] Visit Number: [id number] MRN: [id number]          Service Type: MED Location: 2A A202 01 Attending Physician:  [name] Dictated by:   Valera Castle, M.D. Admit Date:  11/28/2001 Discharge Date: 11/30/2001   CC:         [name], M.D.   Consultation Report  We were asked by Dr. [name] to evaluate [name] for new onset chest tightness with right upper extremity pain.  HISTORY OF PRESENT ILLNESS:  [name] is a 55 year old white female mother of three who comes in with the above complaint since Friday.  There was no correlation with exertion.  There was no association with eating.  She does have a history of reflux symptoms.  Symptoms were also associated with a little bit of mild nausea.  She has had no further chest pain since admission.  She has been treated with nitroglycerin and GI cocktail in the emergency room without significant relief.  Her cardiac enzymes and EKGs have been normal.  PAST MEDICAL HISTORY:  Tubal ligation in 1981, left foot surgery in 1993, history of depression.  She has a history of heavy menses.  She has a significant anemia here in the hospital.  MEDICATIONS: 1. Prozac 20 mg q.d. 2. Prilosec 20 mg q.d. 3. Iron sulfate 325 q.d.  SOCIAL HISTORY:  She is married and has three children.  She works at the [employer] in [place] on [address].  She does not drink.  She smokes about five to six cigarettes a day.  She has a history of smoking heavier than that since age 62.  FAMILY HISTORY:  Pertinent for father and mother in their 40s having heart disease.  There was no premature history of coronary disease.  REVIEW OF SYSTEMS:  Other than the HPI there is no other positives.  Please see the consultation note by Gene Serpe, PA-C.  She does not know her most recent lipid levels, though she says her total cholesterol was 190 in the past.  There is no  history of diabetes or hypertension.  PHYSICAL EXAMINATION  VITAL SIGNS:  Blood pressure 120/60, pulse 60 and regular, temperature 96.8, respiratory 20 and unlabored, weight 213.25.  GENERAL:  She is in no acute distress.  SKIN:  Warm and dry.  NEUROLOGIC:  Grossly intact.  HEENT:  Totally unremarkable.  NECK:  No JVD.  Carotid upstrokes are equal bilaterally without bruits.  There is no thyromegaly.  Trachea is midline.  LUNGS:  Clear to auscultation and percussion.  HEART:  Regular rate and rhythm without murmur, rub, or gallop.  S2 is physiologically split.  ABDOMEN:  Soft.  Good bowel sounds.  There is no obvious hepatosplenomegaly. There are no bruits.  EXTREMITIES:  Femoral pulses 2+/4+ without bruits.  Dorsalis pedis and posterior tibials are intact.  There is no pedal edema.  LABORATORIES:  Chest x-ray is pending.  EKG shows normal sinus rhythm, rate 66.  There is no ischemic changes.  Hemoglobin 9.3, MCV 74.  D-dimer was negative.  CPK and troponins were negative x2.  ASSESSMENT AND PLAN: 1. Non-exertional chest discomfort somewhat atypical for ischemic heart    disease with negative cardiac enzymes.  With her cardiac risk factors we    need to exclude obstructive coronary artery disease.  We have arranged for    a stress Cardiolite tomorrow here at [facility]. 2. Microcytic anemia consistent  with iron deficiency from heavy menses.  She    is currently heme-negative. 3. Tobacco use. 4. History of gastroesophageal reflux. 5. History of depression.  We have made arrangements for the stress Cardiolite tomorrow.  If this is negative for ischemia we will impose no further cardiac work-up.  Cardiac risk factor modification has been emphasized. Dictated by:   Valera Castle, M.D. Attending Physician:  [name] DD:  11/29/01 TD:  11/29/01 Job: [id number]

## 2010-12-20 NOTE — Procedures (Signed)
[name], [name]             ACCOUNT NO.:  [id number]   MEDICAL RECORD NO.:  [id number]          PATIENT TYPE:  OUT   LOCATION:  SLEEP LAB                     FACILITY:  [facility]   PHYSICIAN:  Barbaraann Share, MD,FCCPDATE OF BIRTH:  09-20[birth year]1953   DATE OF STUDY:  11/30/2006                            NOCTURNAL POLYSOMNOGRAM   REFERRING PHYSICIAN:  Scott A. Gerda Diss, MD   LOCATION:  Sleep lab.   REFERRING PHYSICIAN:  Dr. [name].   INDICATION FOR STUDY:  Hypersomnia with sleep apnea.   EPWORTH SLEEPINESS SCORE:  13.   MEDICATIONS:   SLEEP ARCHITECTURE:  The patient had total sleep time of 383 minutes  with decreased slow wave sleep as well as REM.  Sleep onset latency was  normal, and REM onset was mildly prolonged.  Sleep efficiency was  excellent at 97%.   RESPIRATORY DATA:  The patient was found to have six hypopneas and four  apneas for an apnea/hypopnea index of 1.6 events per hour.  Events were  not positional, and there was loud snoring noted throughout.  The  patient did not meet split night protocol secondary to the small numbers  of events.   OXYGEN DATA:  His O2 desaturation as low as 88%.   CARDIAC DATA:  No clinically significant cardiac arrhythmias were noted.   MOVEMENT-PARASOMNIA:  The patient was found to have 31 leg jerks with  1.9 per hour resulting in arousal or awakening.   IMPRESSIONS-RECOMMENDATIONS:  1. Small numbers of obstructive events which do not meet the      apnea/hypopnea index criteria for the obstructive sleep apnea      syndrome.  The patient did have loud snoring and moderate numbers      of nonspecific arousals that may be associated with the upper      airway resistant syndrome.  This is a pre-sleep apnea condition      that can result in sleep disruption and daytime symptoms.      Treatment for snoring as well as the upper airway resistant      syndrome can include weight loss and positional therapy, upper      airway surgery,  as well as oral appliance.  2. Small to moderate numbers of leg jerks with mild sleep disruption.      It is unclear from the study whether this is clinically      significant.  Clinical correlation is suggested.      Barbaraann Share, MD,FCCP  Diplomate, American Board of Sleep  Medicine  Electronically Signed     K[facility]/MEDQ  D:  12/08/2006 10:35:37  T:  12/08/2006 11:31:37  Job:  [id number]

## 2010-12-20 NOTE — Cardiovascular Report (Signed)
NAMEBruna Cochran                            ACCOUNT NO.:  [id number]   MEDICAL RECORD NO.:  [id number]                   PATIENT TYPE:  INP   LOCATION:  2922                                 FACILITY:  [facility]   PHYSICIAN:  Salvadore Farber, M.D.             DATE OF BIRTH:  1956/03/25   DATE OF PROCEDURE:  10/27/2003  DATE OF DISCHARGE:                              CARDIAC CATHETERIZATION   PROCEDURE:  1. Left heart catheterization.  2. Left ventriculography.  3. Coronary angiography.  4. Placement of intra-aortic balloon pump.  5. Drug-alluding stent placement in the left anterior descending (LAD) and     first diagonal using crush stent technique.   CARDIOLOGIST:  Salvadore Farber, M.D.   INDICATIONS:  The patient is a 55 year old lady without prior history of  cardiovascular disease.  She had chest pain for approximately 45 minutes on  the day prior to presentation; The pain resolved spontaneously then.  However, she awoke this morning at 5 a.m. with unrelenting 8/10 substernal  chest discomfort associated with radiation to her left shoulder, nausea,  emesis, and dyspnea.  She called the EMS and was brought emergently to the  [facility].  In the emergency room the electrocardiogram  demonstrated approximately 3 mm of ST elevation in leads I, AVL, and V2.  She was treated with aspirin, heparin, Plavix.  Pain was ongoing.  She was  brought to the catheterization lab emergently.   PROCEDURAL TECHNIQUE:  Informed consent was obtained. Under 1% lidocaine  local anesthesia, a 6 French sheath was placed in the right common femoral  artery, and a 5 French sheath in the right common femoral vein using the  modified Seldinger technique.  Diagnostic angiography and ventriculography  were performed using JL-4, JL-4, and pigtail catheter.  These images  demonstrated 90% stenosis of the proximal LAD extending across the takeoff  of a very large diagonal branch. This diagonal was  occluded at its ostium.  Ejection fraction was approximately 45% with anterolateral and apical  akinesis.   The lesion was very complex and consideration was given to coronary artery  bypass grafting emergently.  Dr. Laneta Simmers was contacted and the case discussed  over the phone.  He told me that given today is a Holiday he had only 1  cardiopulmonary bypass team available and they were in progress with a case  in the operating room.  Thus, I elected to proceed percutaneously.   I began with placing an intra-aortic, pulsation balloon via the left common  femoral artery and a sheath was fashioned.  Counter pulsation was begun at  1:1 and continued throughout the case.  Additional heparin was given to  achieve and maintain an ACG of greater than 200 seconds.  Double bolus of  Eptifibatide was administered.  Anticoagulation was per her randomization in  the HORIZON trial.   I then proceeded to intervention.  The  femoral arterial sheath was exchanged  out for an 8 [place] sheath.  An 4 [place] Q guide was advanced over wire and  engaged in the ostium at the left coronary artery.  Luge wires were advanced  into the distal LAD and distal diagonal.  Reperfusion of the diagonal was  established with the wire.   I began with balloon dilation within the diagonal using a 2.0 x 20 mm  Maverik at 6 atmospheres for 2 inflations.  This restored flow to TIMI 2 to  TIMI 3.  There remained substantial thrombus within the LAD.  I began by  using an export catheter in the LAD.  A single pass with the export was  performed.  Minimal, if any thrombus or atheromatous material was removed.  I then used the 2.0 x 20 mm Maverik at 6 atmospheres in this vessel as well.  There remained substantial stenosis. I confirmed with Dr. Gerri Spore as to  best approach this complex lesion.  We agreed that a crush technique was  most appropriate.  Since the diagonal supplied an even larger territory than  the LAD proper; I  decided to crush the LAD stent and thus emphasize the  diagonal.   A 2.5 x 24-mm Taxus stent was positioned in the LAD. I initially started  with a 2.75 x 32-mm stent in the diagonal; however, this was grossly over  length. It was thus removed with a 2.75 x 24-mm stent.  I first deployed the  LAD stent at 12 atmospheres.  The stent balloon and LAD wire were then  removed.  The diagonal stent was then deployed.  The diagonal stent was  positioned so as to overlap the proximal margin of the LAD stent by several  millimeters, thus crushing the LAD stent in the area of overlap.  The  diagonal stent was deployed at 12 atmospheres.   After being unsuccessful with the luge wire and a PT-2 wire I was able to  manipulate a whisper wire out via the stent struts into the LAD.  I was  unable to pass multiple balloons. I was concerned that the wire was, thus,  under multiple stent struts.  It was thus removed and replaced with a second  whisper wire.  I was then able to pass a 1.5 x 15-mm Maverik.  It was  unclear that this actually was able to pass beyond the stent.  I did inflate  it to 10 atmospheres.  After further repositioning the wire, I was able to  easily pass a 2.0 x 15-mm Maverik into the distal LAD.  It was then pulled  back to the region where the stents overlapped and inflated to 10  atmospheres for 2 sequential inflations.   With the LAD stent thus dilated I returned to the diagonal.  I used a 2.75 x  15-mm Quantum to dilate the distal portion of the stent.  Unfortunately,  though I was careful to avoid the region of overlap it did make it difficult  to pass any further balloons into the LAD.  I thus had to further dilate a  fresh 2.0 x 15-mm Maverik.  After this, I was able to pass a 2.5 x 15-mm  Maverik into the LAD.  I then performed kissing-balloon inflation using a  2.5 x 15-mm Maverik in the LAD and a 2.75 x 15-mm Quantum in the diagonal. Both were simultaneously inflated to 10  atmospheres for 37 seconds.  Final  angiogram demonstrated no residual stenosis  in either vessel and TIMI-3 flow  to the distal vasculature in both.   After confirming that the right femoral arterial sheath entered the common  femoral artery below the level of the inguinal ligament, the arteriotomy was  closed using an 8 [place] AngioSeal device.  Complete hemostasis was  obtained.  At completion of the procedure hematoma was noted in the left  groin at the site of the balloon pump entry.  Hemostasis was obtained by  manual compression.  There remained some oozing though no recurrent  hematoma.  A femoral-popliteal was placed with very light pressure  maintaining 2+ DP pulse.  The patient tolerated the procedure well and was  transferred to the CCU in stable condition.   COMPLICATIONS:  None.   FINDINGS:  1. LV: 128/17/26.  EF: 45% with anterolateral apical akinesis.  2. Left main:  Angiographically normal  3. LAD:  Large vessel giving rise to a very large diagonal branch.  As     detailed above, crushed stent technique was used to stent both vessels     using drug-alluding stents.  As the diagonal was larger than the LAD the     LAD stent was crushed by the diagonal stent.  4. Circumflex:  Moderate size vessel giving rise to 2 obtuse marginals  The     first marginal has a 50% osteal stenosis.  5. RCA:  Large, dominant vessel.  There is a 40% stenosis of the mid vessel.  6. No aortic stenosis or mitral regurgitation.   IMPRESSION/PLAN:  1. Successful drug-eluding stent placement in the LAD and diagonal using the     crush technique.  Taxus stents were used in each.  There was an excellent     angiographic result.  2. We will plan aspirin and Pav[place] indefinitely due to crush technique     utilized.  3. Will administer beta blocker.  4. ACE inhibitor will be initiated once pressure stabilizes.  5. Intra-aortic balloon pump will be continued for a minimum of 24 hours.  6.  Eptifibatide will be continued for 18 hours.                                               Salvadore Farber, M.D.    WED/MEDQ  D:  10/27/2003  T:  10/29/2003  Job:  [id number]   cc:   Lorin Picket A. Gerda Diss, M.D.  [address]  [place]  [place] [address]  Fax: [phone]   Salvadore Farber, M.D.  [phone] [address]. [address]  [address]  [place]  [place] [address]

## 2010-12-20 NOTE — Discharge Summary (Signed)
NAMEBruna Cochran                            ACCOUNT NO.:  [id number]   MEDICAL RECORD NO.:  [id number]                   PATIENT TYPE:  INP   LOCATION:  2907                                 FACILITY:  [facility]   PHYSICIAN:  Rollene Rotunda, M.D.                DATE OF BIRTH:  01-Nov-1955   DATE OF ADMISSION:  04/11/2004  DATE OF DISCHARGE:  04/12/2004                           DISCHARGE SUMMARY - REFERRING   PROCEDURE:  Cardiac catheterization, April 12, 2004.   REASON FOR ADMISSION:  The patient is a 55 year old female with known  coronary artery disease who initially presented to [facility]  Emergency Room with chest discomfort.  Please refer to dictated admission  note for full details.   LABORATORY DATA:  Normal CBC on admission.  Normal electrolytes and renal  function on admission.  Glucose 110.  Cardiac markers normal (x 3).  Lipid  profile:  Total cholesterol 188, triglycerides 204, HDL 41, LDL 106 (ratio  4.6).  TSH 0.52.  Urine drug screen:  Positive for opiates.  Urinalysis  negative.   Admission chest x-ray:  NAD.   HOSPITAL COURSE:  Following admission for further evaluation and treatment  of symptoms suggestive of unstable angina pectoris, the patient ruled out  for myocardial infarction with all serial cardiac markers within normal  enzymes.  Recommendation was to proceed with diagnostic coronary  angiography.  Of note, the patient had recently run out of Plavix and this  was resumed.  She was also started on low-dose Lipitor.   Cardiac catheterization performed, the following day, by Dr. Charlton Haws  (see report for full details), revealed nonobstructive coronary artery  disease with mild left ventricular dysfunction (EF 45-50%) with mild  anterior apical hypokinesis.  Specifically, there was 20% LMCA; widely  patent first diagonal stent site; 20% distal circumflex; normal right  coronary artery.  Dr. Eden Emms recommended medical therapy.  The patient  will  be kept for overnight observation with plans for tentative discharge the  following morning, April 13, 2004.   MEDICATION ADJUSTMENTS THIS ADMISSION:  1.  Resumption of Plavix.  2.  Addition of Lipitor and Protonix.  Of note, the patient was referred to care management for medication  assistance, particularly regarding Plavix.   MEDICATIONS AT DISCHARGE:  1.  Plavix 75 mg every day.  2.  Aspirin 81 mg every day.  3.  Lipitor 20 mg every day.  4.  Protonix 40 mg every day.  5.  Prozac 40 mg every day.  6.  Synthroid 0.125 mg every day.  7.  Altace 2.5 mg every day.  8.  Toprol 50 mg every day.  9.  Xanax as previously directed.  10. Nitrostat 0.4 mg p.r.n.   INSTRUCTIONS:  1.  No heavy lifting, driving x 2 days.  2.  Maintain a low-fat/cholesterol diet.  3.  Call the office if there is any swelling or  bleeding at the groin.   The patient is scheduled to follow up with Dr. Doreatha Martin McDowell/Mike Elise Benne,  P.A.C. on April 26, 2004 at 11:15 a.m. at the [facility]  in [place].   DISCHARGE DIAGNOSES:  1.  Nonischemic chest pain.      1.  Normal serial cardiac markers.      2.  Nonobstructive coronary artery disease, widely patent first diagonal          stent.  Cardiac catheterization April 12, 2004.      3.  Normal left ventricular function.      4.  Status post anterior myocardial infarction/complex percutaneous          intervention left anterior descending artery/diagonal in March 2005.  2.  Fibromyalgia.  3.  Dyslipidemia.  4.  Hypothyroidism.  5.  Obesity.      Gene Serpe, P.A. [facility]                      Rollene Rotunda, M.D.    GS/MEDQ  D:  04/12/2004  T:  04/12/2004  Job:  [id number]   cc:   Lorin Picket A. Gerda Diss, M.D.  [address], [address]  [place]  [place] [address]  Fax: [phone]   [facility]  [address]  [address]  [place], [place] [address]

## 2010-12-20 NOTE — Consult Note (Signed)
NAMEMARSHAL, SCHRECENGOST             ACCOUNT NO.:  [id number]   MEDICAL RECORD NO.:  [id number]          PATIENT TYPE:  INP   LOCATION:  A222                          FACILITY:  [facility]   PHYSICIAN:  Vida Roller, M.D.   DATE OF BIRTH:  [birth year]   DATE OF CONSULTATION:  04/22/2005  DATE OF DISCHARGE:                                   CONSULTATION   PRIMARY:  Scott A. Gerda Diss, M.D.   CARDIOLOGIST:  Jonelle Sidle, M.D. [facility]   HISTORY OF PRESENT ILLNESS:  Ms. Jasso is a 55 year old woman with a  history of coronary disease status post revascularization of her left  anterior descending and diagonal coronary arteries in 2005 with a Taxus  stent. She has had multiple evaluations for recurrent chest discomfort since  that time including a heart catheterization done in June of 2006 which  showed widely patent stents and no progression of her coronary disease. She  has normal LV function. She presents today to Dr. Fletcher Anon office with pain  similar to her previous heart attack. She was moving, lifting some boxes.  She was admitted. She has had no chest discomfort since admission.   ALLERGIES:  She is not allergic to any medications.   CURRENT MEDICATIONS AT HOME:  1.  Plavix 75 mg a day although she stopped two weeks ago.  2.  Lipitor 40 mg a day.  3.  Prozac 60 mg a day.  4.  Nitroglycerin on an as-needed basis.  5.  Imdur 30 mg a day.  6.  Levothyroxine 50 mcg a day.   For reasons that are unclear, she was not on aspirin as an outpatient but is  currently on aspirin, Protonix, and Xanax in addition to her home  medications.   PAST MEDICAL HISTORY:  1.  Coronary artery disease. She had an anterior wall myocardial infarction      with stenting in 2005.  2.  She has a history of fibromyalgia.  3.  History of hypothyroidism.  4.  History of dyslipidemia.  5.  History of obesity.  6.  Major depression with previous suicide attempt.  7.  History of gastroesophageal reflux  disease.   PAST SURGICAL HISTORY:  1.  She has had a tubal ligation.  2.  She has had some surgery on her left foot.   SOCIAL HISTORY:  She lives in [place] by herself. She is divorced. She has two  grown children. She used to smoke, but she quit two years ago after a heart  attack. She does not smoke or use illicit drugs.   FAMILY HISTORY:  Her mother died of coronary disease. She does not know what  age. Her father is still alive but has coronary disease in his 36s. She has  one sibling who died at age 63 of congenital heart disease.   REVIEW OF SYSTEMS:  She denies any fevers, chills, sweats, weight loss or  adenopathy. She does occasionally get a headache but no sinus tenderness and  no nasal discharge. No bleeding. No voice changes. No photophobia. No visual  or hearing loss. No problems with  her dentition. No history of rashes or  lesions on her skin. She does describe the chest discomfort but has no  shortness of breath or dyspnea on exertion on exertion. No PND or orthopnea.  No lower extremity edema. No palpitations. No presyncope or syncope. No  claudication, cough, or wheezing. She denies any urinary frequency, dysuria,  straining at her urine, hematuria or nocturia. She denies any weakness,  numbness, depression, or anxiety. She denies any joint swelling or  deformity. She does occasionally get nausea. No diarrhea or constipation. No  dysphagia or odynophagia. No GERD symptoms. No abdominal pain. No changes in  her bowel habits. No polyuria, polydipsia, heat or cold intolerance. The  remainder of her review of systems is negative.   PHYSICAL EXAMINATION:  VITAL SIGNS:  She weighs 228 pounds, her pulse is 77,  respiratory rate is 21, blood pressure is 120/85.  GENERAL:  She is a well-developed, well-nourished, moderately obese white  female in no apparent distress who is alert and oriented x4.  HEENT:  Unremarkable.  NECK:  Supple. She has jugular venous distention or  carotid bruits.  CHEST:  Clear to auscultation.  CARDIOVASCULAR:  Regular. Her point of maximum pulse is nondisplaced. First  and second heart sounds are normal. There is no third or fourth heart  sounds. There is no murmur noted.  ABDOMEN:  Soft, nontender, normal active bowel sounds.  GENITOURINARY/BREAST/RECTAL:  Exams are all deferred.  EXTREMITIES:  Her lower extremities are without clubbing, cyanosis, or  edema. Pulses are 2+ without bruits.  MUSCULOSKELETAL/NEUROLOGICAL:  Grossly nonfocal.   STUDIES:  She did not have a chest x-ray. Her electrocardiogram shows sinus  rhythm and poor R-wave progression, no ischemic ST-T wave changes, normal  intervals, normal axes.   LABORATORY DATA:  White blood cell count 11.9, H and H of 12 and 37,  platelet count 328. Sodium 137, potassium 4.4, chloride 104, bicarb 30, BUN  5, creatinine 0.9, blood sugar 110. Cardiac enzymes x3 are negative.   ASSESSMENT:  So, this is a woman with chest discomfort which has elements of  both typical and atypical signs for coronary disease. She does have known  coronary disease but has been successfully revascularized and had a recent  heart catheterization which showed patent stents and no progression of her  coronary disease. She has hyperlipidemia which she tells me is reasonably  well controlled on medications. She has obesity.   I would recommend an exercise profusion study. She will need lifelong Plavix  and aspirin. I think she probably deserves a chest x-ray just to ensure that  she does not have a pulmonary infection, especially with her leukocytosis.  We will check a set of fasting lipids in the morning. Probably, she deserves  a TSH and free T4 as well.      Vida Roller, M.D.  Electronically Signed     JH/MEDQ  D:  04/22/2005  T:  04/22/2005  Job:  [id number]

## 2010-12-20 NOTE — Cardiovascular Report (Signed)
NAMEGrant Cochran                  ACCOUNT NO.:  [id number]   MEDICAL RECORD NO.:  [id number]          PATIENT TYPE:  INP   LOCATION:  2039                         FACILITY:  [facility]   PHYSICIAN:  Carole Binning, M.D. LHCDATE OF BIRTH:  01/28/1956   DATE OF PROCEDURE:  12/24/2004  DATE OF DISCHARGE:                              CARDIAC CATHETERIZATION   PROCEDURES PERFORMED:  Left heart catheterization with coronary angiography  and left ventriculography.   INDICATION:  The patient is a 55 year old woman with history of coronary  disease and previous anterolateral wall myocardial infarction. She is status  post stent placement in the left anterior descending artery and first  diagonal branch. She presented to [facility] yesterday with chest  pain and had mildly elevated troponin levels consistent with a possible non-  ST-segment elevation myocardial infarction.   PROCEDURE NOTE:  A 6-French sheath was placed in the right femoral artery.  Coronary angiography was performed with standard Judkins 6-French catheters.  Left ventriculography was performed angled pigtail catheter. Contrast was  Omnipaque. No were no complications.   RESULTS:   HEMODYNAMIC RESULTS:  Left ventricular pressure 120/12. Aortic pressure  126/74. There was no aortic valve gradient.   Left ventriculogram:  There is mild to moderate akinesis of the  anterolateral wall. Ejection fraction was estimated at 55%. There is no  mitral regurgitation.   Coronary arteriography:  The left main is normal.   The left anterior descending artery has a stent in the proximal to mid  vessel extending across the origin of the first diagonal branch. There is  also a stent in the first diagonal branch extending back into the left  anterior descending artery. Both of these stents are widely patent with 0%  stenosis within the stent. The LAD proper beyond the stent is a very small  caliber vessel. The first diagonal  branch is a very large vessel which  courses to the apex and lateral wall. This diagonal branch does bifurcate  halfway down. In the more medial branch of the diagonal is a 50% stenosis.   The left circumflex has a 20% stenosis in the ostium and a 30-40% stenosis  in the mid vessel extending across the origin of the second obtuse marginal  branch. There is a normal-sized first obtuse marginal branch with a 50%  stenosis at its ostium. There is a normal size second obtuse marginal branch  which has 30% stenosis at its ostium. There is a large third obtuse marginal  arising the distal circumflex.   The right coronary is a dominant vessel. There is a diffuse 30% stenosis in  the proximal vessel. The right coronary is otherwise normal giving rise to a  large posterior descending artery and a small posterolateral branch.   IMPRESSION:  1.  Mildly decreased left ventricular systolic function with mild to      moderate akinesis of the anterolateral wall.  2.  Patent stents in left anterior descending artery and diagonal branch.  3.  Residual mild to moderate, but nonobstructive coronary disease.   PLAN:  We will check  a D-dimer as a screening test for possible pulmonary  embolism. We will also recheck her cardiac enzymes. She will be managed  medically for her coronary artery disease.      MWP/MEDQ  D:  12/24/2004  T:  12/24/2004  Job:  [id number]   cc:   Lorin Picket A. Gerda Diss, MD  [address], [address]  [place]  [place] [address]  Fax: [phone]   [facility]  [place], [place]   Cardiac Catheterization Laboratory

## 2010-12-20 NOTE — Discharge Summary (Signed)
NAMEBruna Cochran                            ACCOUNT NO.:  [id number]   MEDICAL RECORD NO.:  [id number]                   PATIENT TYPE:  INP   LOCATION:  2019                                 FACILITY:  [facility]   PHYSICIAN:  Salvadore Farber, M.D. [facility]         DATE OF BIRTH:  01-Jun-1956   DATE OF ADMISSION:  10/27/2003  DATE OF DISCHARGE:  11/01/2003                                 DISCHARGE SUMMARY   PROCEDURES:  1. Left heart catheterization.  2. Left ventriculography.  3. Coronary angiography.  4. Placement of intra-aortic balloon pump.  5. Drug-eluting stent placement x 2.   HOSPITAL COURSE:  [name] is a 55 year old female with no known history  of coronary artery disease.  She had an episode of substernal chest pain the  day before admission, lasting 45 minutes.  Then at 5 a.m. on the day of  admission, she began having substernal chest pain which continued until  after she went to work and EMS was called.  Her EKG showed changes  indicating an acute MI, and she was transported urgently to [facility] for further evaluation and treatment.  The cardiac catheterization  showed a 90% LAD in the region of the first diagonal and total first  diagonal.  Both lesions were treated with drug-eluting stents reducing each  stenosis to 0.  Her EF was 45% with anterolateral and apical akinesis.  There was no other critical disease.  An intra-aortic balloon pump was  placed.  She tolerated the procedure well.  On October 28, 2003, she had  stabilized, and her blood pressure was doing well, and the intra-aortic  balloon pump was removed.  She had a moderate-sized hematoma, and her  hematocrit dropped to 8.9 but there was no evidence of a retroperitoneal  hematoma.  She was transfused and this improved.  She had some chest  heaviness, on October 31, 2003, that was not similar to her previous cardiac  symptoms.  Her chest x-ray and EKG were within normal limits, and her  enzymes were  trending down, so it was felt that this was noncardiac chest  pain and further invasive evaluation was note needed.  Because she had an  anterior MI with decreased EF, she was started on heparin and transitioned  to Coumadin.  It was felt that she did not need to be in the hospital for  the full overlap, so she was switched to Lovenox.  The patient was trained  in giving herself the Lovenox injections and did well with this.   By November 01, 2003, [name] had been by cardiac rehab and was educated  regarding anginal calling 911, use of nitroglycerin, MI restrictions, and  cardiac risk factors.  She was also educated as far as smoking cessation and  exercise guidelines.  She was referred to outpatient cardiac rehab in [place].   On November 01, 2003, [name] was  ambulating without chest pain or shortness  of breath.  Her systolic blood pressure generally runs low, but she  tolerates a systolic blood pressure of approximately 85 very well and no  medicine changes were made.  [name] was considered stable for discharge,  on November 01, 2003, with outpatient followup.   LABORATORY VALUES:  INR at discharge 1.2.  CK-MB and troponin trending down  at 143/2.8 with a troponin I of 6.54.  TSH 1.465.  Hemoglobin 10.1,  hematocrit 29.5, WBCs 11.7, platelets 270.  Sodium 138, potassium 3.5,  chloride 106, CO2 27, BUN 10, creatinine 0.8, glucose 114.  Total  cholesterol 152, triglycerides 216, HDL 29, LDL 80.  CRP 5.8.   CONDITION ON DISCHARGE:  Improved.   DISCHARGE DIAGNOSES:  1. Anterior myocardial infarction.  2. Dyslipidemia.  3. History of treated hypothyroidism.  4. History of fibromyalgia.  5. History of tubal ligation.  6. Family history of coronary artery disease.   DISCHARGE INSTRUCTIONS:  1. Her activity level is to include no driving for a week, no strenuous     activity until cleared by M.D.  2. She is to stick to a low fat and salt diet.  3. She is to call the office for any  problems with the cath site.  4. She is to get her Coumadin checked on Friday.  5. She is to followup with Dr. Gerda Diss as scheduled.  6. She is to followup with the office in [place], and they will call for an     appointment with Dr. Diona Browner.   DISCHARGE MEDICATIONS:  1. Plavix 75 mg a day.  2. Lipitor 80 mg every day.  3. Prozac 40 mg every day.  4. Synthroid alternating 175 mcg/87.5 mcg.  5. Nitroglycerin p.r.n.  6. Altace 2.5 mg every day.  7. Toprol XL 100 mg every day.  8. Aspirin 81 mg every day.  9. Coumadin 5 mg every day or as directed.  10.      Lovenox 100 mg subcu b.i.d., prescription given for doses through     Monday.      [name], P.A. [facility]                  Salvadore Farber, M.D. [facility]    RB/MEDQ  D:  11/01/2003  T:  11/02/2003  Job:  [id number]   cc:   Gerda Diss, Dr.   [facility]  [place], [place]

## 2011-02-20 ENCOUNTER — Other Ambulatory Visit: Payer: Self-pay | Admitting: *Deleted

## 2011-02-20 MED ORDER — ISOSORBIDE MONONITRATE ER 30 MG PO TB24: ORAL_TABLET | ORAL | Status: DC

## 2011-03-04 ENCOUNTER — Telehealth: Payer: Self-pay | Admitting: Cardiology

## 2011-03-04 MED ORDER — CLOPIDOGREL BISULFATE 75 MG PO TABS: 75.0000 mg | ORAL_TABLET | Freq: Every day | ORAL | Status: DC

## 2011-03-04 NOTE — Telephone Encounter (Signed)
.   Requested Prescriptions   Signed Prescriptions Disp Refills  . clopidogrel (PLAVIX) 75 MG tablet 30 tablet 9    Sig: Take 1 tablet (75 mg total) by mouth daily.    Authorizing Provider: MCDOWELL, Illene Bolus    Ordering User: Lacie Scotts

## 2011-04-09 ENCOUNTER — Other Ambulatory Visit: Payer: Self-pay | Admitting: *Deleted

## 2011-04-09 MED ORDER — CLOPIDOGREL BISULFATE 75 MG PO TABS: 75.0000 mg | ORAL_TABLET | Freq: Every day | ORAL | Status: DC

## 2011-04-23 LAB — BASIC METABOLIC PANEL
BUN: 9
BUN: 9
CO2: 29
CO2: 30
Calcium: 9.2
Calcium: 9.4
Chloride: 104
Chloride: 107
Creatinine, Ser: 0.89
Creatinine, Ser: 0.96
GFR calc Af Amer: >60
GFR calc Af Amer: >60
GFR calc non Af Amer: >60
GFR calc non Af Amer: >60
Glucose, Bld: 117 — ABNORMAL HIGH
Glucose, Bld: 201 — ABNORMAL HIGH
Potassium: 4.4
Potassium: 4.8
Sodium: 138
Sodium: 142

## 2011-04-23 LAB — PROTIME-INR
INR: 0.9
Prothrombin Time: 11.9

## 2011-04-23 LAB — CBC
HCT: 37.7
HCT: 39.5
HCT: 40.8
Hemoglobin: 12.8
Hemoglobin: 13.3
Hemoglobin: 13.8
MCHC: 33.7
MCHC: 33.9
MCHC: 34
MCV: 92.2
MCV: 92.7
MCV: 93.3
Platelets: 283
Platelets: 294
Platelets: 314
RBC: 4.08
RBC: 4.24
RBC: 4.4
RDW: 13.4
RDW: 13.7
RDW: 13.8
WBC: 12.9 — ABNORMAL HIGH
WBC: 6.8
WBC: 8.6

## 2011-04-23 LAB — COMPREHENSIVE METABOLIC PANEL
ALT: 28
AST: 37
Albumin: 3.4 — ABNORMAL LOW
Alkaline Phosphatase: 130 — ABNORMAL HIGH
BUN: 7
CO2: 32
Calcium: 9.2
Chloride: 101
Creatinine, Ser: 0.89
GFR calc Af Amer: >60
GFR calc non Af Amer: >60
Glucose, Bld: 136 — ABNORMAL HIGH
Potassium: 4.1
Sodium: 141
Total Bilirubin: 0.4
Total Protein: 7.1

## 2011-04-23 LAB — CARDIAC PANEL(CRET KIN+CKTOT+MB+TROPI)
CK, MB: 1.3
CK, MB: 1.5
Relative Index: INVALID
Relative Index: INVALID
Total CK: 45
Total CK: 56
Troponin I: 0.1 — ABNORMAL HIGH
Troponin I: 0.15 — ABNORMAL HIGH

## 2011-04-23 LAB — APTT: aPTT: 32

## 2011-04-23 LAB — CK TOTAL AND CKMB (NOT AT ARMC)
CK, MB: 1.5
Relative Index: INVALID
Total CK: 51

## 2011-04-23 LAB — D-DIMER, QUANTITATIVE: D-Dimer, Quant: 0.37

## 2011-04-28 LAB — BASIC METABOLIC PANEL
BUN: 7
CO2: 28
Calcium: 9.4
Chloride: 107
Creatinine, Ser: 0.9
GFR calc Af Amer: >60
GFR calc non Af Amer: >60
Glucose, Bld: 103 — ABNORMAL HIGH
Potassium: 4
Sodium: 139

## 2011-04-28 LAB — POCT HEMOGLOBIN-HEMACUE: Hemoglobin: 13.6

## 2011-10-02 DIAGNOSIS — Z0000 Encounter for general adult medical examination without abnormal findings: Secondary | ICD-10-CM | POA: Diagnosis not present

## 2011-10-21 DIAGNOSIS — K649 Unspecified hemorrhoids: Secondary | ICD-10-CM | POA: Diagnosis not present

## 2011-10-21 NOTE — H&P (Signed)
  NTS SOAP Note  Vital Signs:  Vitals as of: 10/21/2011: Systolic 136: Diastolic 77: Heart Rate 63: Temp 97.41F: Height 39ft 6in: Weight 219Lbs 0 Ounces: OFC 0in: Respiratory Rate 0: O2 Saturation 0: Pain Level 5: BMI 35  BMI : 35.35 kg/m2  Subjective: This 56 Years 39 Months old Female presents for of  HEMORRHOIDS: ,Has had hemorrhoidal disease for many years.  Has intermittent rectal pain and discomfort with bowel movements.  Can have a difficult time keeping herself clean.  Has had a TCS in the recent past.  Review of Symptoms:  Constitutional:unremarkable Head:unremarkable Eyes:unremarkable Nose/Mouth/Throat:unremarkable Cardiovascular:unremarkable Respiratory:unremarkable Gastrointestinheartburn Genitourinary:unremarkable Musculoskeletal:unremarkable Skin:unremarkable Hematolgic/Lymphatic:unremarkable Allergic/Immunologic:unremarkable   Past Medical History:Reviewed   Past Medical History  Surgical History: coronary stents Medical Problems:  High Blood pressure, High cholesterol, Hypothyroidism Allergies: NKDA Medications: metoprolol, plavix, synthroid, lipitor, imdur, prozac, nexium, alprazolam   Social History:Reviewed   Social History  Preferred Language: English ([place]) Ethnicity: Not Hispanic / Latino Age: 56 Years 4 Months Marital Status:  M Alcohol:  No Recreational drug(s):  No   Smoking Status: Current every day smoker reviewed on 10/21/2011 Started Date: 08/04/1978 Packs per day: 0.50   Family History:Reviewed   Family History  Is there a family history ZO:[id number], heart disease    Objective Information: General:Well appearing, well nourished in no distress. Head:Atraumatic; no masses; no abnormalities Heart:RRR, no murmur or gallop.  Normal S1, S2.  No S3, S4.  Lungs:CTA bilaterally, no wheezes, rhonchi, rales.  Breathing unlabored. Abdomen:Soft, NT/ND, no HSM, no  masses. Large excess hemorrhoidal tissue noted, especially anteriorly and posteriorly  Normal sphincter tone, heme negative.  Assessment:Hemorrhoidal disease  Diagnosis &amp; Procedure: DiagnosisCode: 455.6, ProcedureCode: 98119,    Plan: Scheduled for hemorrhoidectomy on 11/12/11.  Patient Education:Alternative treatments to surgery were discussed with patient (and family).Risks and benefits  of procedure were fully explained to the patient (and family) who gave informed consent. Patient/family questions were addressed.  Follow-up:Pending Surgery

## 2011-11-05 ENCOUNTER — Encounter (HOSPITAL_COMMUNITY): Payer: Self-pay | Admitting: Pharmacy Technician

## 2011-11-06 ENCOUNTER — Other Ambulatory Visit: Payer: Self-pay

## 2011-11-06 ENCOUNTER — Encounter (HOSPITAL_COMMUNITY)
Admission: RE | Admit: 2011-11-06 | Discharge: 2011-11-06 | Disposition: A | Discharge status: Home / Self Care | Payer: Medicare Other | Source: Ambulatory Visit | Attending: General Surgery | Admitting: General Surgery

## 2011-11-06 ENCOUNTER — Encounter (HOSPITAL_COMMUNITY): Payer: Self-pay

## 2011-11-06 DIAGNOSIS — E039 Hypothyroidism, unspecified: Secondary | ICD-10-CM | POA: Diagnosis not present

## 2011-11-06 DIAGNOSIS — E785 Hyperlipidemia, unspecified: Secondary | ICD-10-CM | POA: Diagnosis not present

## 2011-11-06 DIAGNOSIS — I200 Unstable angina: Secondary | ICD-10-CM | POA: Diagnosis not present

## 2011-11-06 DIAGNOSIS — I2510 Atherosclerotic heart disease of native coronary artery without angina pectoris: Secondary | ICD-10-CM | POA: Diagnosis not present

## 2011-11-06 HISTORY — DX: Hypothyroidism, unspecified: E03.9

## 2011-11-06 HISTORY — DX: Major depressive disorder, single episode, unspecified: F32.9

## 2011-11-06 HISTORY — DX: Anxiety disorder, unspecified: F41.9

## 2011-11-06 HISTORY — DX: Acute myocardial infarction, unspecified: I21.9

## 2011-11-06 HISTORY — DX: Gastro-esophageal reflux disease without esophagitis: K21.9

## 2011-11-06 HISTORY — DX: Depression, unspecified: F32.A

## 2011-11-06 HISTORY — DX: Fibromyalgia: M79.7

## 2011-11-06 LAB — BASIC METABOLIC PANEL
BUN: 10 mg/dL (ref 6–23)
CO2: 30 mEq/L (ref 19–32)
Calcium: 9.8 mg/dL (ref 8.4–10.5)
Chloride: 101 mEq/L (ref 96–112)
Creatinine, Ser: 0.89 mg/dL (ref 0.50–1.10)
GFR calc Af Amer: 82 mL/min — ABNORMAL LOW (ref >90)
GFR calc non Af Amer: 71 mL/min — ABNORMAL LOW (ref >90)
Glucose, Bld: 106 mg/dL — ABNORMAL HIGH (ref 70–99)
Potassium: 4.1 mEq/L (ref 3.5–5.1)
Sodium: 138 mEq/L (ref 135–145)

## 2011-11-06 LAB — CBC
HCT: 40.1 % (ref 36.0–46.0)
Hemoglobin: 13.2 g/dL (ref 12.0–15.0)
MCH: 31.1 pg (ref 26.0–34.0)
MCHC: 32.9 g/dL (ref 30.0–36.0)
MCV: 94.4 fL (ref 78.0–100.0)
Platelets: 264 10*3/uL (ref 150–400)
RBC: 4.25 MIL/uL (ref 3.87–5.11)
RDW: 14.3 % (ref 11.5–15.5)
WBC: 8 10*3/uL (ref 4.0–10.5)

## 2011-11-06 LAB — DIFFERENTIAL
Basophils Absolute: 0.1 10*3/uL (ref 0.0–0.1)
Basophils Relative: 1 % (ref 0–1)
Eosinophils Absolute: 0.5 10*3/uL (ref 0.0–0.7)
Eosinophils Relative: 6 % — ABNORMAL HIGH (ref 0–5)
Lymphocytes Relative: 34 % (ref 12–46)
Lymphs Abs: 2.7 10*3/uL (ref 0.7–4.0)
Monocytes Absolute: 0.8 10*3/uL (ref 0.1–1.0)
Monocytes Relative: 10 % (ref 3–12)
Neutro Abs: 4 10*3/uL (ref 1.7–7.7)
Neutrophils Relative %: 51 % (ref 43–77)

## 2011-11-06 LAB — SURGICAL PCR SCREEN
MRSA, PCR: NEGATIVE
Staphylococcus aureus: NEGATIVE

## 2011-11-06 NOTE — Patient Instructions (Addendum)
[name]  11/06/2011   Your procedure is scheduled on:  11/12/2011  Report to [facility] at  700  AM.  Call this number if you have problems the morning of surgery: [phone]   Remember:   Do not eat food:After Midnight.  May have clear liquids:until Midnight .  Clear liquids include soda, tea, black coffee, apple or grape juice, broth.  Take these medicines the morning of surgery with A SIP OF WATER: xanax,prozac,nexium,imdur,synthroid,lopressor   Do not wear jewelry, make-up or nail polish.  Do not wear lotions, powders, or perfumes. You may wear deodorant.  Do not shave 48 hours prior to surgery.  Do not bring valuables to the hospital.  Contacts, dentures or bridgework may not be worn into surgery.  Leave suitcase in the car. After surgery it may be brought to your room.  For patients admitted to the hospital, checkout time is 11:00 AM the day of discharge.   Patients discharged the day of surgery will not be allowed to drive home.  Name and phone number of your driver: family  Special Instructions: N/A   Please read over the following fact sheets that you were given: Pain Booklet, Surgical Site Infection Prevention, Anesthesia Post-op Instructions and Care and Recovery After Surgery Hemorrhoidectomy Hemorrhoidectomy is surgery to remove hemorrhoids. Hemorrhoids are veins that have become swollen in the rectum. The rectum is the area from the bottom end of the intestines to the opening where bowel movements leave the body. Hemorrhoids can be uncomfortable. They can cause itching, bleeding and pain if a blood clot forms in them (thrombose). If hemorrhoids are small, surgery may not be needed. But if they cover a larger area, surgery is usually suggested.  LET YOUR CAREGIVER KNOW ABOUT:   Any allergies.   All medications you are taking, including:   Herbs, eyedrops, over-the-counter medications and creams.   Blood thinners (anticoagulants), aspirin or other drugs that  could affect blood clotting.   Use of steroids (by mouth or as creams).   Previous problems with anesthetics, including local anesthetics.   Possibility of pregnancy, if this applies.   Any history of blood clots.   Any history of bleeding or other blood problems.   Previous surgery.   Smoking history.   Other health problems.  RISKS AND COMPLICATIONS All surgery carries some risk. However, hemorrhoid surgery usually goes smoothly. Possible complications could include:  Urinary retention.   Bleeding.   Infection.   A painful incision.   A reaction to the anesthesia (this is not common).  BEFORE THE PROCEDURE   Stop using aspirin and non-steroidal anti-inflammatory drugs (NSAIDs) for pain relief. This includes prescription drugs and over-the-counter drugs such as ibuprofen and naproxen. Also stop taking vitamin E. If possible, do this two weeks before your surgery.   If you take blood-thinners, ask your healthcare provider when you should stop taking them.   You will probably have blood and urine tests done several days before your surgery.   Do not eat or drink for about 8 hours before the surgery.   Arrive at least an hour before the surgery, or whenever your surgeon recommends. This will give you time to check in and fill out any needed paperwork.   Hemorrhoidectomy is often an outpatient procedure. This means you will be able to go home the same day. Sometimes, though, people stay overnight in the hospital after the procedure. Ask your surgeon what to expect. Either way, make arrangements in advance for  someone to drive you home.  PROCEDURE   The preparation:   You will change into a hospital gown.   You will be given an IV. A needle will be inserted in your arm. Medication can flow directly into your body through this needle.   You might be given an enema to clear your rectum.   Once in the operating room, you will probably lie on your side or be repositioned  later to lying on your stomach.   You will be given anesthesia (medication) so you will not feel anything during the surgery. The surgery often is done with local anesthesia (the area near the hemorrhoids will be numb and you will be drowsy but awake). Sometimes, general anesthesia is used (you will be asleep during the procedure).   The procedure:   There are a few different procedures for hemorrhoids. Be sure to ask you surgeon about the procedure, the risks and benefits.   Be sure to ask about what you need to do to take care of the wound, if there is one.  AFTER THE PROCEDURE  You will stay in a recovery area until the anesthesia has worn off. Your blood pressure and pulse will be checked every so often.   You may feel a lot of pain in the area of the rectum.   Take all pain medication prescribed by your surgeon. Ask before taking any over-the-counter pain medicines.   Sometimes sitting in a warm bath can help relieve your pain.   To make sure you have bowel movements without straining:   You will probably need to take stool softeners (usually a pill) for a few days.   You should drink 8 to 10 glasses of water each day.   Your activity will be restricted for awhile. Ask your caregiver for a list of what you should and should not do while you recover.  Document Released: 05/18/2009 Document Revised: 07/10/2011 Document Reviewed: 05/18/2009 [facility] Patient Information 2012 [place], [place].PATIENT INSTRUCTIONS POST-ANESTHESIA  IMMEDIATELY FOLLOWING SURGERY:  Do not drive or operate machinery for the first twenty four hours after surgery.  Do not make any important decisions for twenty four hours after surgery or while taking narcotic pain medications or sedatives.  If you develop intractable nausea and vomiting or a severe headache please notify your doctor immediately.  FOLLOW-UP:  Please make an appointment with your surgeon as instructed. You do not need to follow up with  anesthesia unless specifically instructed to do so.  WOUND CARE INSTRUCTIONS (if applicable):  Keep a dry clean dressing on the anesthesia/puncture wound site if there is drainage.  Once the wound has quit draining you may leave it open to air.  Generally you should leave the bandage intact for twenty four hours unless there is drainage.  If the epidural site drains for more than 36-48 hours please call the anesthesia department.  QUESTIONS?:  Please feel free to call your physician or the hospital operator if you have any questions, and they will be happy to assist you.     [facility] Anesthesia Department [address] [place] [phone]

## 2011-11-06 NOTE — Progress Notes (Signed)
11/06/11 1118  OBSTRUCTIVE SLEEP APNEA  Have you ever been diagnosed with sleep apnea through a sleep study? No  Do you snore loudly (loud enough to be heard through closed doors)?  1  Do you often feel tired, fatigued, or sleepy during the daytime? 1  Has anyone observed you stop breathing during your sleep? 0  Do you have, or are you being treated for high blood pressure? 1  BMI more than 35 kg/m2? 1  Age over 58 years old? 1  Neck circumference greater than 40 cm/18 inches? 0  Gender: 0  Obstructive Sleep Apnea Score 5   Score 4 or greater  Updated health history;Results sent to PCP

## 2011-11-08 ENCOUNTER — Emergency Department (HOSPITAL_COMMUNITY): Payer: Medicare Other

## 2011-11-08 ENCOUNTER — Encounter (HOSPITAL_COMMUNITY): Payer: Self-pay | Admitting: *Deleted

## 2011-11-08 ENCOUNTER — Inpatient Hospital Stay (HOSPITAL_COMMUNITY)
Admission: EM | Admit: 2011-11-08 | Discharge: 2011-11-09 | DRG: 303 | Disposition: A | Discharge status: Home / Self Care | Payer: Medicare Other | Source: Ambulatory Visit | Attending: Cardiology | Admitting: Cardiology

## 2011-11-08 DIAGNOSIS — I10 Essential (primary) hypertension: Secondary | ICD-10-CM | POA: Diagnosis present

## 2011-11-08 DIAGNOSIS — K219 Gastro-esophageal reflux disease without esophagitis: Secondary | ICD-10-CM | POA: Diagnosis present

## 2011-11-08 DIAGNOSIS — Z7902 Long term (current) use of antithrombotics/antiplatelets: Secondary | ICD-10-CM

## 2011-11-08 DIAGNOSIS — I252 Old myocardial infarction: Secondary | ICD-10-CM | POA: Diagnosis not present

## 2011-11-08 DIAGNOSIS — E782 Mixed hyperlipidemia: Secondary | ICD-10-CM | POA: Diagnosis present

## 2011-11-08 DIAGNOSIS — I2510 Atherosclerotic heart disease of native coronary artery without angina pectoris: Principal | ICD-10-CM | POA: Diagnosis present

## 2011-11-08 DIAGNOSIS — R079 Chest pain, unspecified: Secondary | ICD-10-CM | POA: Diagnosis not present

## 2011-11-08 DIAGNOSIS — Z9861 Coronary angioplasty status: Secondary | ICD-10-CM | POA: Diagnosis not present

## 2011-11-08 DIAGNOSIS — E785 Hyperlipidemia, unspecified: Secondary | ICD-10-CM | POA: Diagnosis present

## 2011-11-08 DIAGNOSIS — E039 Hypothyroidism, unspecified: Secondary | ICD-10-CM | POA: Diagnosis present

## 2011-11-08 DIAGNOSIS — F411 Generalized anxiety disorder: Secondary | ICD-10-CM | POA: Diagnosis present

## 2011-11-08 DIAGNOSIS — F17200 Nicotine dependence, unspecified, uncomplicated: Secondary | ICD-10-CM | POA: Diagnosis not present

## 2011-11-08 DIAGNOSIS — I959 Hypotension, unspecified: Secondary | ICD-10-CM | POA: Diagnosis not present

## 2011-11-08 DIAGNOSIS — Z7982 Long term (current) use of aspirin: Secondary | ICD-10-CM | POA: Diagnosis not present

## 2011-11-08 DIAGNOSIS — R0789 Other chest pain: Secondary | ICD-10-CM | POA: Diagnosis not present

## 2011-11-08 DIAGNOSIS — F329 Major depressive disorder, single episode, unspecified: Secondary | ICD-10-CM | POA: Diagnosis present

## 2011-11-08 DIAGNOSIS — F3289 Other specified depressive episodes: Secondary | ICD-10-CM | POA: Diagnosis present

## 2011-11-08 DIAGNOSIS — I200 Unstable angina: Secondary | ICD-10-CM | POA: Diagnosis present

## 2011-11-08 DIAGNOSIS — Z79899 Other long term (current) drug therapy: Secondary | ICD-10-CM

## 2011-11-08 DIAGNOSIS — IMO0001 Reserved for inherently not codable concepts without codable children: Secondary | ICD-10-CM | POA: Diagnosis present

## 2011-11-08 DIAGNOSIS — R001 Bradycardia, unspecified: Secondary | ICD-10-CM

## 2011-11-08 DIAGNOSIS — I498 Other specified cardiac arrhythmias: Secondary | ICD-10-CM | POA: Diagnosis not present

## 2011-11-08 DIAGNOSIS — R918 Other nonspecific abnormal finding of lung field: Secondary | ICD-10-CM | POA: Diagnosis not present

## 2011-11-08 HISTORY — DX: Atherosclerotic heart disease of native coronary artery without angina pectoris: I25.10

## 2011-11-08 HISTORY — DX: Palpitations: R00.2

## 2011-11-08 HISTORY — DX: Non-ST elevation (NSTEMI) myocardial infarction: I21.4

## 2011-11-08 LAB — BASIC METABOLIC PANEL
BUN: 10 mg/dL (ref 6–23)
CO2: 29 mEq/L (ref 19–32)
Calcium: 9.3 mg/dL (ref 8.4–10.5)
Chloride: 105 mEq/L (ref 96–112)
Creatinine, Ser: 0.95 mg/dL (ref 0.50–1.10)
GFR calc Af Amer: 76 mL/min — ABNORMAL LOW (ref >90)
GFR calc non Af Amer: 65 mL/min — ABNORMAL LOW (ref >90)
Glucose, Bld: 102 mg/dL — ABNORMAL HIGH (ref 70–99)
Potassium: 3.8 mEq/L (ref 3.5–5.1)
Sodium: 140 mEq/L (ref 135–145)

## 2011-11-08 LAB — DIFFERENTIAL
Basophils Absolute: 0 K/uL (ref 0.0–0.1)
Basophils Absolute: 0 10*3/uL (ref 0.0–0.1)
Basophils Relative: 0 % (ref 0–1)
Basophils Relative: 1 % (ref 0–1)
Eosinophils Absolute: 0.5 K/uL (ref 0.0–0.7)
Eosinophils Absolute: 0.4 10*3/uL (ref 0.0–0.7)
Eosinophils Relative: 6 % — ABNORMAL HIGH (ref 0–5)
Eosinophils Relative: 6 % — ABNORMAL HIGH (ref 0–5)
Lymphocytes Relative: 30 % (ref 12–46)
Lymphocytes Relative: 39 % (ref 12–46)
Lymphs Abs: 2.4 K/uL (ref 0.7–4.0)
Lymphs Abs: 3 10*3/uL (ref 0.7–4.0)
Monocytes Absolute: 0.7 K/uL (ref 0.1–1.0)
Monocytes Absolute: 0.4 10*3/uL (ref 0.1–1.0)
Monocytes Relative: 9 % (ref 3–12)
Monocytes Relative: 6 % (ref 3–12)
Neutro Abs: 4.3 K/uL (ref 1.7–7.7)
Neutro Abs: 3.8 10*3/uL (ref 1.7–7.7)
Neutrophils Relative %: 55 % (ref 43–77)
Neutrophils Relative %: 49 % (ref 43–77)

## 2011-11-08 LAB — COMPREHENSIVE METABOLIC PANEL
ALT: 22 U/L (ref 0–35)
AST: 19 U/L (ref 0–37)
Albumin: 3 g/dL — ABNORMAL LOW (ref 3.5–5.2)
Alkaline Phosphatase: 125 U/L — ABNORMAL HIGH (ref 39–117)
BUN: 8 mg/dL (ref 6–23)
CO2: 27 mEq/L (ref 19–32)
Calcium: 8.3 mg/dL — ABNORMAL LOW (ref 8.4–10.5)
Chloride: 108 mEq/L (ref 96–112)
Creatinine, Ser: 0.79 mg/dL (ref 0.50–1.10)
GFR calc Af Amer: >90 mL/min (ref >90)
GFR calc non Af Amer: >90 mL/min (ref >90)
Glucose, Bld: 108 mg/dL — ABNORMAL HIGH (ref 70–99)
Potassium: 3.5 mEq/L (ref 3.5–5.1)
Sodium: 141 mEq/L (ref 135–145)
Total Bilirubin: 0.4 mg/dL (ref 0.3–1.2)
Total Protein: 6.1 g/dL (ref 6.0–8.3)

## 2011-11-08 LAB — CBC
HCT: 36.8 % (ref 36.0–46.0)
HCT: 34.9 % — ABNORMAL LOW (ref 36.0–46.0)
Hemoglobin: 12.1 g/dL (ref 12.0–15.0)
Hemoglobin: 11.7 g/dL — ABNORMAL LOW (ref 12.0–15.0)
MCH: 31 pg (ref 26.0–34.0)
MCH: 31.5 pg (ref 26.0–34.0)
MCHC: 32.9 g/dL (ref 30.0–36.0)
MCHC: 33.5 g/dL (ref 30.0–36.0)
MCV: 94.4 fL (ref 78.0–100.0)
MCV: 93.8 fL (ref 78.0–100.0)
Platelets: 241 K/uL (ref 150–400)
Platelets: 205 10*3/uL (ref 150–400)
RBC: 3.9 MIL/uL (ref 3.87–5.11)
RBC: 3.72 MIL/uL — ABNORMAL LOW (ref 3.87–5.11)
RDW: 14.2 % (ref 11.5–15.5)
RDW: 14.2 % (ref 11.5–15.5)
WBC: 7.9 K/uL (ref 4.0–10.5)
WBC: 7.6 10*3/uL (ref 4.0–10.5)

## 2011-11-08 LAB — CARDIAC PANEL(CRET KIN+CKTOT+MB+TROPI)
CK, MB: 1.8 ng/mL (ref 0.3–4.0)
Relative Index: INVALID (ref 0.0–2.5)
Total CK: 68 U/L (ref 7–177)
Troponin I: <0.3 ng/mL (ref <0.30)

## 2011-11-08 LAB — POCT I-STAT TROPONIN I: Troponin i, poc: 0 ng/mL (ref 0.00–0.08)

## 2011-11-08 LAB — PROTIME-INR
INR: 1.1 (ref 0.00–1.49)
Prothrombin Time: 14.4 seconds (ref 11.6–15.2)

## 2011-11-08 MED ORDER — NITROGLYCERIN 0.4 MG SL SUBL: 0.4000 mg | SUBLINGUAL_TABLET | SUBLINGUAL | Status: DC | PRN

## 2011-11-08 MED ORDER — ASPIRIN EC 81 MG PO TBEC
81.0000 mg | DELAYED_RELEASE_TABLET | Freq: Every day | ORAL | Status: DC
  Administered 2011-11-09: 81 mg via ORAL
  Filled 2011-11-08: qty 1

## 2011-11-08 MED ORDER — FLUOXETINE HCL 40 MG PO CAPS: 40.0000 mg | ORAL_CAPSULE | Freq: Every day | ORAL | Status: DC

## 2011-11-08 MED ORDER — ISOSORBIDE MONONITRATE 15 MG HALF TABLET
15.0000 mg | ORAL_TABLET | Freq: Every day | ORAL | Status: DC
  Administered 2011-11-09: 15 mg via ORAL
  Filled 2011-11-08: qty 1

## 2011-11-08 MED ORDER — HEPARIN (PORCINE) IN NACL 100-0.45 UNIT/ML-% IJ SOLN
1100.0000 [IU]/h | INTRAMUSCULAR | Status: DC
  Administered 2011-11-08: 1100 [IU]/h via INTRAVENOUS
  Filled 2011-11-08 (×2): qty 250

## 2011-11-08 MED ORDER — PANTOPRAZOLE SODIUM 40 MG PO TBEC
80.0000 mg | DELAYED_RELEASE_TABLET | Freq: Every day | ORAL | Status: DC
  Administered 2011-11-09: 80 mg via ORAL
  Filled 2011-11-08 (×2): qty 1

## 2011-11-08 MED ORDER — ATORVASTATIN CALCIUM 80 MG PO TABS
80.0000 mg | ORAL_TABLET | Freq: Every day | ORAL | Status: DC
  Administered 2011-11-08 – 2011-11-09 (×2): 80 mg via ORAL
  Filled 2011-11-08 (×2): qty 1

## 2011-11-08 MED ORDER — ACETAMINOPHEN 325 MG PO TABS: 650.0000 mg | ORAL_TABLET | ORAL | Status: DC | PRN

## 2011-11-08 MED ORDER — NITROGLYCERIN IN D5W 200-5 MCG/ML-% IV SOLN
3.0000 ug/min | INTRAVENOUS | Status: DC
  Filled 2011-11-08: qty 250

## 2011-11-08 MED ORDER — METOPROLOL TARTRATE 50 MG PO TABS
50.0000 mg | ORAL_TABLET | Freq: Two times a day (BID) | ORAL | Status: DC
  Administered 2011-11-09: 50 mg via ORAL
  Filled 2011-11-08 (×3): qty 1

## 2011-11-08 MED ORDER — SODIUM CHLORIDE 0.9 % IV SOLN: 1.0000 g | INTRAVENOUS | Status: DC

## 2011-11-08 MED ORDER — ONDANSETRON HCL 4 MG/2ML IJ SOLN: 4.0000 mg | Freq: Four times a day (QID) | INTRAMUSCULAR | Status: DC | PRN

## 2011-11-08 MED ORDER — SODIUM CHLORIDE 0.9 % IV SOLN
1.0000 g | INTRAVENOUS | Status: DC
  Filled 2011-11-08: qty 1

## 2011-11-08 MED ORDER — ENOXAPARIN SODIUM 40 MG/0.4ML ~~LOC~~ SOLN: 40.0000 mg | Freq: Once | SUBCUTANEOUS | Status: DC

## 2011-11-08 MED ORDER — CLOPIDOGREL BISULFATE 75 MG PO TABS
75.0000 mg | ORAL_TABLET | Freq: Every day | ORAL | Status: DC
  Administered 2011-11-09: 75 mg via ORAL
  Filled 2011-11-08 (×2): qty 1

## 2011-11-08 MED ORDER — LEVOTHYROXINE SODIUM 125 MCG PO TABS
125.0000 ug | ORAL_TABLET | Freq: Every day | ORAL | Status: DC
  Administered 2011-11-09: 125 ug via ORAL
  Filled 2011-11-08: qty 1

## 2011-11-08 MED ORDER — HEPARIN BOLUS VIA INFUSION
4000.0000 [IU] | Freq: Once | INTRAVENOUS | Status: AC
  Administered 2011-11-08: 4000 [IU] via INTRAVENOUS
  Filled 2011-11-08: qty 4000

## 2011-11-08 MED ORDER — ASPIRIN 81 MG PO CHEW: 324.0000 mg | CHEWABLE_TABLET | ORAL | Status: DC

## 2011-11-08 MED ORDER — FLUOXETINE HCL 20 MG PO CAPS
40.0000 mg | ORAL_CAPSULE | Freq: Every day | ORAL | Status: DC
  Administered 2011-11-08 – 2011-11-09 (×2): 40 mg via ORAL
  Filled 2011-11-08 (×2): qty 2

## 2011-11-08 NOTE — ED Notes (Signed)
Pt arrived via ems d/t cp. Pt describes pain as heavy. Pt has received nitro spray x2 and 324mg  aspirin prior to arrival. Pt reports relief of pain from 10/10 to 5/10 after nitro and aspirin.

## 2011-11-08 NOTE — ED Provider Notes (Signed)
History  This chart was scribed for Tracy Gaskins, MD by Bennett Scrape. This patient was seen in room APA02/APA02 and the patient's care was started at 2:37PM.  CSN: [id number]  Arrival date & time 11/08/11  1358   First MD Initiated Contact with Patient 11/08/11 1434      Chief Complaint  Patient presents with  . Chest Pain    Patient is a 56 y.o. female presenting with chest pain. The history is provided by the patient. No language interpreter was used.  Chest Pain The chest pain began 1 - 2 hours ago. Duration of episode(s) is 2 hours. Chest pain occurs constantly. The chest pain is improving. Associated with: nothing. At its most intense, the pain is at 10/10. The pain is currently at 5/10. The severity of the pain is mild. The quality of the pain is described as pressure-like. The pain radiates to the left arm. Exacerbated by: nothing. Pertinent negatives for primary symptoms include no fever, no shortness of breath, no cough, no abdominal pain, no nausea and no vomiting.  Pertinent negatives for associated symptoms include no diaphoresis and no weakness. She tried nitroglycerin and aspirin for the symptoms.  Her past medical history is significant for hypertension and MI.  Pertinent negatives for past medical history include no DVT and no PE.  Pertinent negatives for family medical history include: no heart disease in family and no PE in family.     [name] is a 56 y.o. female with a h/o MI brought in by ambulance, who presents to the Emergency Department complaining of 2 hours of gradual onset, gradually improving, constant chest pain. She describes the chest pain as being a painful pressure-like feeling located sternally. The pain radiates down her left arm. She rates her pain a 10 out of 10 at its worst and a 5 out of 10 currently. She states that the chest pain and left arm pain have since resolved. Pt was given 2 nitroglycerine sprays and 324mg  of Asprin PTA by EMS.  Pt states that the nitroglycerin did not improve her pain. She now c/o a generalized throbbing HA from the nitroglycerine. She denies any modifying factors.  She denies nausea, diaphoresis, SOB, and weakness as associated symptoms. Pt reports that she had 2 stents placed after her MI in 2005, but states that she has not been on plavix for 4 days due to a prescheduled hemorroid surgery on 11/12/11. She reports experiencing similar chest pains intermittently after the stents were placed but states that she hasn't experienced chest pain in over one year. She denies any recent long distance traveling. She denies any recent heavy lifting or change in activity. She denies having a h/o DVT/PE. Pt also has a h/o HTN and asthma. She is a current everyday smoker but denies alcohol use.  Pt is seen at [facility] cardiology.  Past Medical History  Diagnosis Date  . Anxiety   . Depression   . GERD (gastroesophageal reflux disease)   . Hypertension   . Hypothyroidism   . Angina 2005    stent placement  . Fibromyalgia   . Asthma     bronchitis  . Myocardial infarction 2005     stent placement    Past Surgical History  Procedure Date  . Tubal ligation   . Foot bone excision     sesmoid bone left foot-repair of fracture  . Carpal tunnel release 2007 and 2008    left and right  . Coronary angioplasty 2005  with stent x2    Family History  Problem Relation Age of Onset  . Anesthesia problems Neg Hx   . Hypotension Neg Hx   . Malignant hyperthermia Neg Hx   . Pseudochol deficiency Neg Hx     History  Substance Use Topics  . Smoking status: Current Everyday Smoker -- 0.2 packs/day for 40 years    Types: Cigarettes  . Smokeless tobacco: Not on file  . Alcohol Use: No    Review of Systems  Constitutional: Negative for fever and diaphoresis.  Respiratory: Negative for cough and shortness of breath.   Cardiovascular: Positive for chest pain.  Gastrointestinal: Negative for nausea, vomiting,  abdominal pain and diarrhea.  Neurological: Positive for headaches. Negative for weakness.  All other systems reviewed and are negative.    Allergies  Review of patient's allergies indicates no known allergies.  Home Medications   Current Outpatient Rx  Name Route Sig Dispense Refill  . ALPRAZOLAM 1 MG PO TABS Oral Take 1 mg by mouth 4 (four) times daily as needed. Anxiety    . ASPIRIN EC 81 MG PO TBEC Oral Take 81 mg by mouth daily.    . ATORVASTATIN CALCIUM 80 MG PO TABS Oral Take 80 mg by mouth daily.    Marland Kitchen CLOPIDOGREL BISULFATE 75 MG PO TABS Oral Take 1 tablet (75 mg total) by mouth daily. 30 tablet 1    PATIENT NEED OFFICE VISIT  . ESOMEPRAZOLE MAGNESIUM 40 MG PO CPDR Oral Take 40 mg by mouth daily before breakfast.    . FLUOXETINE HCL 40 MG PO CAPS Oral Take 40 mg by mouth daily.    . ISOSORBIDE MONONITRATE ER 30 MG PO TB24 Oral Take 15 mg by mouth daily.    Marland Kitchen LEVOTHYROXINE SODIUM 125 MCG PO TABS Oral Take 125 mcg by mouth daily.    Marland Kitchen METOPROLOL TARTRATE 50 MG PO TABS Oral Take 50 mg by mouth 2 (two) times daily.      Triage Vitals: BP 91/60  Pulse 48  Temp(Src) 97.9 F (36.6 C) (Oral)  Resp 18  Ht 5\' 6"  (1.676 m)  Wt 220 lb (99.791 kg)  BMI 35.51 kg/m2  SpO2 99%  Physical Exam  Nursing note and vitals reviewed. CONSTITUTIONAL: Well developed/well nourished HEAD AND FACE: Normocephalic/atraumatic EYES: EOMI/PERRL ENMT: Mucous membranes moist NECK: supple no meningeal signs SPINE:entire spine nontender CV: bradycardia, S1/S2 noted, no murmurs/rubs/gallops noted LUNGS: Lungs are clear to auscultation bilaterally, no apparent distress ABDOMEN: soft, nontender, no rebound or guarding GU:no cva tenderness NEURO: Pt is awake/alert, moves all extremitiesx4 EXTREMITIES: pulses normal, full ROM SKIN: warm, color normal PSYCH: no abnormalities of mood noted  ED Course  Procedures   CRITICAL CARE Performed by: [name]   Total critical care time:  35  Critical care time was exclusive of separately billable procedures and treating other patients.  Critical care was necessary to treat or prevent imminent or life-threatening deterioration.  Critical care was time spent personally by me on the following activities: development of treatment plan with patient and/or surrogate as well as nursing, discussions with consultants, evaluation of patient's response to treatment, examination of patient, obtaining history from patient or surrogate, ordering and performing treatments and interventions, ordering and review of laboratory studies, ordering and review of radiographic studies, pulse oximetry and re-evaluation of patient's condition.   DIAGNOSTIC STUDIES: Oxygen Saturation is 99% on room air, normal by my interpretation.    COORDINATION OF CARE: 2:40PM-Discussed plan to consult [facility] cardiology and  advised pt of probable admission due to return of pain from previous CAD. Discussed treatment plan with pt at bedside and pt agreed to plan. Pt with h/o drug eluting stent to LAD/diagonal 2005 Pt is bradycardic and mildly hypotension, though suspect hypotension is med related.  She is stable at this time Pt has already been given ASA prior to arrival  3:37 PM Pt is pain free at this time She is still bradycardic and hypotensive EKG shows sinus rhythm  I spoke to dr Gloris [place] turner with cardiology We discussed labs (negative troponin) and her EKG findings and her cardiac history She will accept in transfer to tele at [facility] NTG/heparin for now   Labs Reviewed  CBC  DIFFERENTIAL  BASIC METABOLIC PANEL     MDM  Nursing notes reviewed and considered in documentation labs/vitals reviewed and considered xrays reviewed and considered Previous records reviewed and considered     Date: 11/08/2011  Rate: 46  Rhythm: sinus bradycardia  QRS Axis: normal  Intervals: normal  ST/T Wave abnormalities: normal  Conduction  Disutrbances:none  Narrative Interpretation:   Old EKG Reviewed: bradycardia noted before (51)    I personally performed the services described in this documentation, which was scribed in my presence. The recorded information has been reviewed and considered.       Tracy Gaskins, MD 11/08/11 [phone]

## 2011-11-08 NOTE — H&P (Addendum)
Admit date: 11/08/2011 Primary Cardiologist  Dr. Diona Browner Chief complaint/reason for admission:Chest Pain  HPI: This is a 56yo WF with a history of CAD s/p MI in 2005 at which time she underwent PCI to LAD with DES.  She has done well since until 12 noon when she developed CP while getting ready to ride her motorcycle.  She describes her pain as intense pain in her neck and pressure in her chest.  She took a NTG spry after 5 minutes and waited for 20 minutes and took another NTG spray without any relief and called 911. The pain also radiated down her left arm.  She denied any SOB, diaphoresis or nausea.  Her pain eventually resolved 45 minutes after going to the ER.  She has not had any further episodes since then.  She has had some intermittent vague pain since her MI in 2009 always relieved with NTG spray x 1 and it usually occurred at night in bed.  She has not had any exertional pain.    PMH:    Past Medical History  Diagnosis Date  . Anxiety   . Depression     Prior suicide attempt  . GERD (gastroesophageal reflux disease)   . Essential hypertension, benign   . Hypothyroidism   . Fibromyalgia   . Asthma   . Coronary atherosclerosis of native coronary artery 2005    Nonobstructive circ/RCA, DES LAD/diagonal 2005, LVEF 60%  . Palpitations     PVC's have been noted  . NSTEMI (non-ST elevated myocardial infarction) 2005 and 2009    PSH:    Past Surgical History  Procedure Date  . Tubal ligation   . Foot bone excision     Sesmoid bone left foot-repair of fracture  . Carpal tunnel release 2007 and 2008    Bilateral  . Cardiac catheterization 2005  . Coronary angioplasty 2005  2005  ALLERGIES:   Review of patient's allergies indicates no known allergies.  Prior to Admit Meds:   Prescriptions prior to admission  Medication Sig Dispense Refill  . atorvastatin (LIPITOR) 80 MG tablet Take 80 mg by mouth daily.      . clopidogrel (PLAVIX) 75 MG tablet Take 1 tablet (75 mg total) by  mouth daily.  30 tablet  1  . esomeprazole (NEXIUM) 40 MG capsule Take 40 mg by mouth daily before breakfast.      . FLUoxetine (PROZAC) 40 MG capsule Take 40 mg by mouth daily.      . isosorbide mononitrate (IMDUR) 30 MG 24 hr tablet Take 15 mg by mouth daily.      Marland Kitchen levothyroxine (SYNTHROID, LEVOTHROID) 125 MCG tablet Take 125 mcg by mouth daily.      . metoprolol (LOPRESSOR) 50 MG tablet Take 50 mg by mouth 2 (two) times daily.       Family HX:    Family History  Problem Relation Age of Onset  . Anesthesia problems Neg Hx   . Hypotension Neg Hx   . Malignant hyperthermia Neg Hx   . Pseudochol deficiency Neg Hx   . Coronary artery disease Father     MI age 16   Social HX:    History   Social History  . Marital Status: Divorced    Spouse Name: N/A    Number of Children: N/A  . Years of Education: N/A   Occupational History  . Not on file.   Social History Main Topics  . Smoking status: Current Everyday Smoker -- 0.5 packs/day  for 40 years    Types: Cigarettes  . Smokeless tobacco: Never Used  . Alcohol Use: No  . Drug Use: No  . Sexually Active: Yes    Birth Control/ Protection: Post-menopausal   Other Topics Concern  . Not on file   Social History Narrative  . No narrative on file     ROS:  All 11 ROS were addressed and are negative except what is stated in the HPI  PHYSICAL EXAM Filed Vitals:   11/08/11 1805  BP: 107/72  Pulse: 41  Temp: 98.3 F (36.8 C)  Resp: 18   General: Well developed, well nourished, in no acute distress Head: Eyes PERRLA, No xanthomas.   Normal cephalic and atramatic  Lungs:   Clear bilaterally to auscultation and percussion. Heart:   HRRR S1 S2 Pulses are 2+ & equal. Abdomen: Bowel sounds are positive, abdomen soft and non-tender without masses  Extremities:   No clubbing, cyanosis or edema.  DP +1 Neuro: Alert and oriented X 3. Psych:  Good affect, responds appropriately   Labs:   Lab Results  Component Value Date    WBC 7.9 11/08/2011   HGB 12.1 11/08/2011   HCT 36.8 11/08/2011   MCV 94.4 11/08/2011   PLT 241 11/08/2011    Lab 11/08/11 1500  NA 140  K 3.8  CL 105  CO2 29  BUN 10  CREATININE 0.95  CALCIUM 9.3  PROT --  BILITOT --  ALKPHOS --  ALT --  AST --  GLUCOSE 102*   Lab Results  Component Value Date   CKTOTAL 51 08/09/2007   CKMB 1.5 08/09/2007   TROPONINI  Value: 0.10        PERSISTENTLY INCREASED TROPONIN VALUES IN THE RANGE OF 0.06-0.49 ng/mL CAN BE SEEN IN:       -UNSTABLE ANGINA       -CONGESTIVE HEART FAILURE       -MYOCARDITIS       -CHEST TRAUMA       -ARRYHTHMIAS       -LATE PRESENTING MI       -COPD   CLINICAL FOLLOW-UP RECOMMENDED.* 08/09/2007   No results found for this basename: PTT   Lab Results  Component Value Date   INR 0.9 08/08/2007       Radiology:    EKG:  NSR ASSESSMENT:  1.  USAP with negative troponin x1 2.  CAD s/p remote MI with PCI of LAD 2005 3.  HTN 4.  Dyslipidemia 5.  GERD 6.  Fibromyalgia 7.  Hypothyroidism 8.  Depression   PLAN:   1.  Admit to tele bed 2.  IV Heparin gtt until rule out complete 3.  ASA/Plavix/statin/beta blocker 4.  Further workup based on enzyme trend - will make NPO after midnight 5.  IV NTG gtt    Quintella Reichert, MD  11/08/2011  7:28 PM

## 2011-11-08 NOTE — Progress Notes (Signed)
ANTICOAGULATION CONSULT NOTE - Initial Consult  Pharmacy Consult for Heparin Indication: chest pain/ACS  No Known Allergies  Patient Measurements: Height: 5\' 6"  (167.6 cm) Weight: 226 lb 13.7 oz (102.9 kg) IBW/kg (Calculated) : 59.3  Heparin Dosing Weight: 80 kg  Vital Signs: Temp: 98.3 F (36.8 C) (04/06 1805) Temp src: Oral (04/06 1805) BP: 107/72 mmHg (04/06 1805) Pulse Rate: 41  (04/06 1805)  Labs:  Basename 11/08/11 1500 11/06/11 1120  HGB 12.1 13.2  HCT 36.8 40.1  PLT 241 264  APTT -- --  LABPROT -- --  INR -- --  HEPARINUNFRC -- --  CREATININE 0.95 0.89  CKTOTAL -- --  CKMB -- --  TROPONINI -- --   Estimated Creatinine Clearance: 79.1 ml/min (by C-G formula based on Cr of 0.95).  Medical History: Past Medical History  Diagnosis Date  . Anxiety   . Depression     Prior suicide attempt  . GERD (gastroesophageal reflux disease)   . Essential hypertension, benign   . Hypothyroidism   . Fibromyalgia   . Asthma   . Coronary atherosclerosis of native coronary artery     Nonobstructive circ/RCA, DES LAD/diagonal 2005, LVEF 60%  . Palpitations     PVC's have been noted  . NSTEMI (non-ST elevated myocardial infarction) 2005    Medications:  Scheduled:    . aspirin  324 mg Oral NOW  . aspirin EC  81 mg Oral Daily  . atorvastatin  80 mg Oral Daily  . clopidogrel  75 mg Oral Daily  . FLUoxetine  40 mg Oral Daily  . isosorbide mononitrate  15 mg Oral Daily  . levothyroxine  125 mcg Oral Daily  . metoprolol  50 mg Oral BID  . pantoprazole  80 mg Oral Q1200  . DISCONTD: enoxaparin  40 mg Subcutaneous Once  . DISCONTD: enoxaparin  40 mg Subcutaneous Once  . DISCONTD: enoxaparin (LOVENOX) injection  40 mg Subcutaneous Once  . DISCONTD: ertapenem  1 g Intravenous 60 min Pre-Op  . DISCONTD: ertapenem  1 g Intravenous 60 min Pre-Op  . DISCONTD: ertapenem  1 g Intravenous 60 min Pre-Op  . DISCONTD: FLUoxetine  40 mg Oral Daily    Assessment: 56 yo F with  hx CAD developed exertional chest pain today and plan to start heparin while rule out MI. Initial cardiac enzymes negative. No history of bleeding noted. Of note, patient has been off plavix for past week in anticipation of hemorrhoidectomy on 11/12/11.    Goal of Therapy:  Heparin level 0.3-0.7 units/ml   Plan:  1. Heparin 4000 unit IV bolus followed by heparin infusion at 1100 units/hr. 2. Check 6hr heparin level. 3. Daily heparin level & CBC with platelet monitoring per protocol.  Elson Clan 11/08/2011,7:58 PM

## 2011-11-09 ENCOUNTER — Other Ambulatory Visit: Payer: Self-pay

## 2011-11-09 DIAGNOSIS — F17200 Nicotine dependence, unspecified, uncomplicated: Secondary | ICD-10-CM

## 2011-11-09 DIAGNOSIS — I200 Unstable angina: Secondary | ICD-10-CM

## 2011-11-09 DIAGNOSIS — I2510 Atherosclerotic heart disease of native coronary artery without angina pectoris: Principal | ICD-10-CM

## 2011-11-09 DIAGNOSIS — I10 Essential (primary) hypertension: Secondary | ICD-10-CM

## 2011-11-09 LAB — CBC
HCT: 34.2 % — ABNORMAL LOW (ref 36.0–46.0)
Hemoglobin: 11.3 g/dL — ABNORMAL LOW (ref 12.0–15.0)
MCH: 30.8 pg (ref 26.0–34.0)
MCHC: 33 g/dL (ref 30.0–36.0)
MCV: 93.2 fL (ref 78.0–100.0)
Platelets: 203 10*3/uL (ref 150–400)
RBC: 3.67 MIL/uL — ABNORMAL LOW (ref 3.87–5.11)
RDW: 14.2 % (ref 11.5–15.5)
WBC: 8.2 10*3/uL (ref 4.0–10.5)

## 2011-11-09 LAB — CARDIAC PANEL(CRET KIN+CKTOT+MB+TROPI)
CK, MB: 1.7 ng/mL (ref 0.3–4.0)
CK, MB: 1.5 ng/mL (ref 0.3–4.0)
Relative Index: INVALID (ref 0.0–2.5)
Relative Index: INVALID (ref 0.0–2.5)
Total CK: 66 U/L (ref 7–177)
Total CK: 66 U/L (ref 7–177)
Troponin I: <0.3 ng/mL (ref <0.30)
Troponin I: <0.3 ng/mL (ref <0.30)

## 2011-11-09 LAB — HEPARIN LEVEL (UNFRACTIONATED)
Heparin Unfractionated: 0.4 IU/mL (ref 0.30–0.70)
Heparin Unfractionated: 0.35 IU/mL (ref 0.30–0.70)

## 2011-11-09 LAB — TSH: TSH: 0.279 u[IU]/mL — ABNORMAL LOW (ref 0.350–4.500)

## 2011-11-09 MED ORDER — ENOXAPARIN SODIUM 40 MG/0.4ML ~~LOC~~ SOLN: 40.0000 mg | Freq: Once | SUBCUTANEOUS | Status: DC

## 2011-11-09 MED ORDER — ASPIRIN 81 MG PO TBEC: 81.0000 mg | DELAYED_RELEASE_TABLET | Freq: Every day | ORAL | Status: AC

## 2011-11-09 MED ORDER — NITROGLYCERIN 0.4 MG SL SUBL: 0.4000 mg | SUBLINGUAL_TABLET | SUBLINGUAL | Status: DC | PRN

## 2011-11-09 MED ORDER — SODIUM CHLORIDE 0.9 % IV SOLN: 1.0000 g | Freq: Once | INTRAVENOUS | Status: DC

## 2011-11-09 NOTE — Progress Notes (Signed)
Patient ambulated in the hallway 250 feet, tolerated well. Will continue to monitor.

## 2011-11-09 NOTE — Discharge Instructions (Signed)
Please do not smoke.  You will be contacted by the office about a stress test in [place] plus a follow up appointment. Please reschedule your upcoming surgery for after your office visit.

## 2011-11-09 NOTE — Progress Notes (Signed)
   Subjective: No further chest pain, no shortness of breath or palpitations.   Objective: Temp:  [97.4 F (36.3 C)-98.3 F (36.8 C)] 97.4 F (36.3 C) (04/07 0604) Pulse Rate:  [41-55] 55  (04/07 0604) Resp:  [14-20] 20  (04/07 0604) BP: (91-107)/(51-72) 106/65 mmHg (04/07 0604) SpO2:  [95 %-99 %] 95 % (04/07 0604) Weight:  [220 lb (99.791 kg)-226 lb 13.7 oz (102.9 kg)] 226 lb 13.7 oz (102.9 kg) (04/06 1805)  Telemetry - Sinus rhythm.  Exam -   General - NAD.  Lungs - Clear, nonlabored.  Cardiac - RRR, no rub or gallop.  Abdomen - NABS.  Extremities - No pitting.  Testing -   Lab Results  Component Value Date   WBC 8.2 11/09/2011   HGB 11.3* 11/09/2011   HCT 34.2* 11/09/2011   MCV 93.2 11/09/2011   PLT 203 11/09/2011    Lab Results  Component Value Date   CREATININE 0.79 11/08/2011   BUN 8 11/08/2011   NA 141 11/08/2011   K 3.5 11/08/2011   CL 108 11/08/2011   CO2 27 11/08/2011    Lab Results  Component Value Date   CKTOTAL 66 11/09/2011   CKMB 1.5 11/09/2011   TROPONINI <0.30 11/09/2011    ECG - Sinus rhythm with PVC, no acute ST segment changes.   Current Medications    . aspirin  324 mg Oral NOW  . aspirin EC  81 mg Oral Daily  . atorvastatin  80 mg Oral Daily  . clopidogrel  75 mg Oral Daily  . FLUoxetine  40 mg Oral Daily  . heparin  4,000 Units Intravenous Once  . isosorbide mononitrate  15 mg Oral Daily  . levothyroxine  125 mcg Oral Daily  . metoprolol  50 mg Oral BID  . pantoprazole  80 mg Oral Q1200  . DISCONTD: enoxaparin  40 mg Subcutaneous Once  . DISCONTD: enoxaparin  40 mg Subcutaneous Once  . DISCONTD: enoxaparin (LOVENOX) injection  40 mg Subcutaneous Once  . DISCONTD: enoxaparin (LOVENOX) injection  40 mg Subcutaneous Once  . DISCONTD: ertapenem  1 g Intravenous 60 min Pre-Op  . DISCONTD: ertapenem  1 g Intravenous 60 min Pre-Op  . DISCONTD: ertapenem  1 g Intravenous 60 min Pre-Op  . DISCONTD: ertapenem  1 g Intravenous Once  . DISCONTD:  FLUoxetine  40 mg Oral Daily    Assessment:  1. Chest pain syndrome, possibly angina, resolved with no recurrence under observation. ECG nonspecific and cardiac markers are normal.  2. CAD status post previous DES to the LAD and diagonal in 2005.  3. Hypertension.  4. Hyperlipidemia, on statin therapy.  Plan:  Discussed with patient. She states that she would like to go home. We will discontinue heparin and have her ambulate in the hall. If she has no recurrent chest pain, she can be discharged later today. She will need to followup with a Lexiscan Myoview at [facility] in [place] this week (preferably within 72 hours), and then see [place] back in the office within 2 weeks for a followup visit. Incidentally, she tells me that she was pending hemorrhoid surgery this Wednesday. I have recommended that the surgery be deferred for now until followup cardiac testing is completed and she is reassessed.  Jonelle Sidle, M.D., F.A.C.C.

## 2011-11-09 NOTE — Discharge Summary (Signed)
CARDIOLOGY DISCHARGE SUMMARY   Patient ID: [name] MRN: [id number] DOB/AGE: May 27, 1956 56 y.o.  Admit date: 11/08/2011 Discharge date: 11/09/2011  Primary Discharge Diagnosis:  Chest pain Secondary Discharge Diagnosis:  Patient Active Problem List  Diagnoses  . UNSPECIFIED HYPOTHYROIDISM  . Mixed hyperlipidemia  . HYPERLIPIDEMIA  . TOBACCO ABUSE  . Essential hypertension, benign  . ACUT MI SUBENDOCARDIAL INFARCT INIT EPIS CARE  . CORONARY ATHEROSCLEROSIS NATIVE CORONARY ARTERY  . PRECORDIAL PAIN  . PERS HX TOBACCO USE PRESENTING HAZARDS HEALTH  . POSTSURG PERCUT TRANSLUMINAL COR ANGPLSTY STS  . PALPITATIONS  . Unstable angina    Hospital Course: [name] is a 56 year old female with a history of CAD. She had chest pain and came to the hospital where she was admitted for further evaluation and treatment.  Her cardiac enzymes were negative for MI. Her symptoms resolved without further intervention. Her heparin was discontinued and she was ambulated. Her chest Xray was mildly abnormal but her oxygen saturation was normal on room air and she was not having any SOB.   On 11/09/2011, [name] was seen by Dr Diona Browner. She was ambulating without chest pain or SOB and considered stable for discharge, to follow up in [place] with a stress test, then office visit, then reschedule hemorrhoid surgery as an outpatient.   Labs:   Lab Results  Component Value Date   WBC 8.2 11/09/2011   HGB 11.3* 11/09/2011   HCT 34.2* 11/09/2011   MCV 93.2 11/09/2011   PLT 203 11/09/2011    Lab 11/08/11 2003  NA 141  K 3.5  CL 108  CO2 27  BUN 8  CREATININE 0.79  CALCIUM 8.3*  PROT 6.1  BILITOT 0.4  ALKPHOS 125*  ALT 22  AST 19  GLUCOSE 108*    Basename 11/09/11 0810 11/09/11 0157 11/08/11 2003  CKTOTAL 66 66 68  CKMB 1.5 1.7 1.8  CKMBINDEX -- -- --  TROPONINI <0.30 <0.30 <0.30    Basename 11/08/11 2003  INR 1.10       Radiology: Dg Chest Port 1 View 11/08/2011  *RADIOLOGY  REPORT*  Clinical Data: Chest pain  PORTABLE CHEST - 1 VIEW  Comparison: 04/23/2006  Findings: Heart size appears normal.   There are decreased lung volumes.  Mild hazy lung opacities are identified within both lung bases, left greater than right.  No pleural effusions noted.  IMPRESSION:  1.  Low lung volumes and mild bilateral hazy lung opacities which may indicate edema or infiltrate.  Original Report Authenticated By: Rosealee Albee, M.D.    EKG: 09-Nov-2011 06:11:38  Sinus rhythm with occasional Premature ventricular complexes Low voltage QRS Vent. rate 61 BPM PR interval 164 Tracy QRS duration 90 Tracy QT/QTc 444/446 Tracy P-R-T axes 43 10 61   FOLLOW UP PLANS AND APPOINTMENTS Discharge Orders    Future Orders Please Complete By Expires   Diet - low sodium heart healthy      Increase activity slowly     Stress test and follow up in [place].   No Known Allergies Medication List  As of 11/09/2011  2:30 PM   TAKE these medications         aspirin 81 MG EC tablet   Take 1 tablet (81 mg total) by mouth daily.      atorvastatin 80 MG tablet   Commonly known as: LIPITOR   Take 80 mg by mouth daily.      clopidogrel 75 MG tablet  Commonly known as: PLAVIX   Take 1 tablet (75 mg total) by mouth daily.      esomeprazole 40 MG capsule   Commonly known as: NEXIUM   Take 40 mg by mouth daily before breakfast.      FLUoxetine 40 MG capsule   Commonly known as: PROZAC   Take 40 mg by mouth daily.      isosorbide mononitrate 30 MG 24 hr tablet   Commonly known as: IMDUR   Take 15 mg by mouth daily.      levothyroxine 125 MCG tablet   Commonly known as: SYNTHROID, LEVOTHROID   Take 125 mcg by mouth daily.      metoprolol 50 MG tablet   Commonly known as: LOPRESSOR   Take 50 mg by mouth 2 (two) times daily.      nitroGLYCERIN 0.4 MG SL tablet   Commonly known as: NITROSTAT   Place 1 tablet (0.4 mg total) under the tongue every 5 (five) minutes x 3 doses as needed for chest pain.             Follow-up Information    Follow up with LUKING,SCOTT, MD .     Stress test and follow up in [place]    BRING ALL MEDICATIONS WITH YOU TO FOLLOW UP APPOINTMENTS  Time spent with patient to include physician time: 33 min Signed: [name] 11/09/2011, 2:30 PM Co-Sign MD

## 2011-11-09 NOTE — Progress Notes (Signed)
ANTICOAGULATION CONSULT NOTE - Initial Consult  Pharmacy Consult for Heparin Indication: chest pain/ACS  No Known Allergies  Patient Measurements: Height: 5\' 6"  (167.6 cm) Weight: 226 lb 13.7 oz (102.9 kg) IBW/kg (Calculated) : 59.3  Heparin Dosing Weight: 80 kg  Vital Signs: Temp: 97.6 F (36.4 C) (04/06 2100) Temp src: Oral (04/06 2100) BP: 91/55 mmHg (04/06 2100) Pulse Rate: 52  (04/06 2100)  Labs:  Basename 11/09/11 0157 11/08/11 2003 11/08/11 1500 11/06/11 1120  HGB 11.3* 11.7* -- --  HCT 34.2* 34.9* 36.8 --  PLT 203 205 241 --  APTT -- -- -- --  LABPROT -- 14.4 -- --  INR -- 1.10 -- --  HEPARINUNFRC 0.40 -- -- --  CREATININE -- 0.79 0.95 0.89  CKTOTAL 66 68 -- --  CKMB 1.7 1.8 -- --  TROPONINI <0.30 <0.30 -- --   Estimated Creatinine Clearance: 93.9 ml/min (by C-G formula based on Cr of 0.79).  Assessment: 56 yo F with chest pain for Heparin  Goal of Therapy:  Heparin level 0.3-0.7 units/ml   Plan:  Continue Heparin at current rate  Recheck level this am to verify.  Eddie Candle 11/09/2011,3:28 AM

## 2011-11-11 ENCOUNTER — Telehealth: Payer: Self-pay | Admitting: Cardiology

## 2011-11-11 NOTE — Telephone Encounter (Signed)
Per Dr. Aquilla Hacker will order a Steffanie Dunn on Mrs. Mattice. Called and left a message asking her To call the office.

## 2011-11-11 NOTE — Discharge Summary (Signed)
See my rounding note from day of discharge for full details.

## 2011-11-12 ENCOUNTER — Telehealth: Payer: Self-pay | Admitting: *Deleted

## 2011-11-12 ENCOUNTER — Ambulatory Visit (HOSPITAL_COMMUNITY): Admission: RE | Admit: 2011-11-12 | Payer: Medicare Other | Source: Ambulatory Visit | Admitting: General Surgery

## 2011-11-12 ENCOUNTER — Encounter (HOSPITAL_COMMUNITY): Admission: RE | Payer: Self-pay | Source: Ambulatory Visit

## 2011-11-12 ENCOUNTER — Other Ambulatory Visit: Payer: Self-pay | Admitting: Cardiology

## 2011-11-12 DIAGNOSIS — R002 Palpitations: Secondary | ICD-10-CM

## 2011-11-12 SURGERY — HEMORRHOIDECTOMY
Anesthesia: General

## 2011-11-12 NOTE — Telephone Encounter (Signed)
No precert required 

## 2011-11-12 NOTE — Telephone Encounter (Signed)
Lexiscan Myoview scheduled for 4-12 at [facility] Checking Medicaid for percert

## 2011-11-14 DIAGNOSIS — R072 Precordial pain: Secondary | ICD-10-CM | POA: Diagnosis not present

## 2011-11-14 DIAGNOSIS — R9439 Abnormal result of other cardiovascular function study: Secondary | ICD-10-CM | POA: Diagnosis not present

## 2011-11-14 DIAGNOSIS — R002 Palpitations: Secondary | ICD-10-CM | POA: Diagnosis not present

## 2011-11-14 DIAGNOSIS — I252 Old myocardial infarction: Secondary | ICD-10-CM | POA: Diagnosis not present

## 2011-11-17 ENCOUNTER — Telehealth: Payer: Self-pay | Admitting: Cardiology

## 2011-11-17 NOTE — Telephone Encounter (Signed)
[name] states that on Sunday evening 11-16-11 she started having pressure in her chest. States that she has Been off Prozac 60 mg and Xanax 1 mg for approximately 4 weeks now. She is wondering if by being off these medications Is causing any of the pressure feeling in her chest. States that someone gave her a Xanax last evening around 11pm and The pain went away. Please call her today on the following no. [phone]. She is staying with someone. She had stress Test at [facility] on Friday, 11/14/2011.

## 2011-11-17 NOTE — Telephone Encounter (Signed)
Pt notified if chest pressure is a result of anxiety-type issues then Xanax and Prozac may help as she indicated. Pt instructed to f/u w/primary MD regarding these issues as he/she should be managing the medications. Pt verbalized understanding.

## 2011-11-28 ENCOUNTER — Ambulatory Visit (INDEPENDENT_AMBULATORY_CARE_PROVIDER_SITE_OTHER): Payer: Medicare Other | Admitting: Physician Assistant

## 2011-11-28 ENCOUNTER — Encounter: Payer: Self-pay | Admitting: Physician Assistant

## 2011-11-28 ENCOUNTER — Encounter: Payer: Self-pay | Admitting: *Deleted

## 2011-11-28 VITALS — BP 111/77 | HR 58 | Ht 66.0 in | Wt 223.5 lb

## 2011-11-28 DIAGNOSIS — R072 Precordial pain: Secondary | ICD-10-CM | POA: Diagnosis not present

## 2011-11-28 DIAGNOSIS — R0602 Shortness of breath: Secondary | ICD-10-CM

## 2011-11-28 DIAGNOSIS — I2510 Atherosclerotic heart disease of native coronary artery without angina pectoris: Secondary | ICD-10-CM

## 2011-11-28 DIAGNOSIS — Z01810 Encounter for preprocedural cardiovascular examination: Secondary | ICD-10-CM

## 2011-11-28 DIAGNOSIS — R9430 Abnormal result of cardiovascular function study, unspecified: Secondary | ICD-10-CM | POA: Diagnosis not present

## 2011-11-28 DIAGNOSIS — F17200 Nicotine dependence, unspecified, uncomplicated: Secondary | ICD-10-CM | POA: Diagnosis not present

## 2011-11-28 DIAGNOSIS — E785 Hyperlipidemia, unspecified: Secondary | ICD-10-CM

## 2011-11-28 LAB — PROTIME-INR

## 2011-11-28 MED ORDER — ISOSORBIDE MONONITRATE ER 30 MG PO TB24: 30.0000 mg | ORAL_TABLET | Freq: Every day | ORAL | Status: DC

## 2011-11-28 NOTE — Assessment & Plan Note (Signed)
Continue high dose Lipitor. Aggressive management recommended with target LDL 70 or less, if feasible.

## 2011-11-28 NOTE — Assessment & Plan Note (Addendum)
Recommendation is to proceed with a diagnostic coronary angiogram and possible PCI. Patient's recent episode of CP was quite worrisome, although she did rule out for MI. She has not had recurrent symptoms; however, recent post hospital followup stress test suggested moderate peri-infarct ischemia, with preserved LVF. Given this finding, my plan is to proceed with an invasive evaluation, with which the patient concurs. The risks/benefits of the procedure were discussed, in conjunction with Dr. Andee Lineman. Will arrange for an elective catheterization in the [facility] lab, early next week. Meanwhile, Imdur will be increased to 30 daily. Patient does have prn NTG.

## 2011-11-28 NOTE — Progress Notes (Signed)
HPI: Patient presents as post hospital followup from [facility], status post CP admission with normal cardiac markers. Plan was for outpatient stress test, performed April 12, and reviewed by Dr. McDowell. This was a Lexiscan Myoview; EF 56%; large partially reversible defect c/w history apical/anterior apical scar with moderate peri-infarct ischemia, mainly AS/AL distribution.  Clinically, patient has not had any recurrent CP, since her recent hospitalization. Her precipitating episode, however, was quite worrisome, and reminiscent of prior presentations. She developed significant anterior chest discomfort, referred to as "pressure" (10/10), with radiation to the neck. She took NTG spray at home, with no effect, and presented to [facility] ED. She was placed on IV heparin, transferred to [facility], and ruled out for MI with normal cardiac markers.   No Known Allergies  Current Outpatient Prescriptions  Medication Sig Dispense Refill  . aspirin EC 81 MG EC tablet Take 1 tablet (81 mg total) by mouth daily.      . atorvastatin (LIPITOR) 80 MG tablet Take 80 mg by mouth daily.      . clopidogrel (PLAVIX) 75 MG tablet Take 1 tablet (75 mg total) by mouth daily.  30 tablet  1  . esomeprazole (NEXIUM) 40 MG capsule Take 40 mg by mouth daily before breakfast.      . FLUoxetine (PROZAC) 40 MG capsule Take 40 mg by mouth daily.      . isosorbide mononitrate (IMDUR) 30 MG 24 hr tablet Take 15 mg by mouth daily.      . levothyroxine (SYNTHROID, LEVOTHROID) 125 MCG tablet Take 125 mcg by mouth daily.      . metoprolol (LOPRESSOR) 50 MG tablet Take 50 mg by mouth 2 (two) times daily.      . nitroGLYCERIN (NITROSTAT) 0.4 MG SL tablet Place 1 tablet (0.4 mg total) under the tongue every 5 (five) minutes x 3 doses as needed for chest pain.  25 tablet  3    Past Medical History  Diagnosis Date  . Anxiety   . Depression     Prior suicide attempt  . GERD (gastroesophageal reflux disease)   . Essential hypertension, benign   .  Hypothyroidism   . Fibromyalgia   . Asthma   . Coronary atherosclerosis of native coronary artery     Nonobstructive circ/RCA, DES LAD/diagonal 2005, LVEF 60%  . Palpitations     PVC's have been noted  . NSTEMI (non-ST elevated myocardial infarction) 2005    Past Surgical History  Procedure Date  . Tubal ligation   . Foot bone excision     Sesmoid bone left foot-repair of fracture  . Carpal tunnel release 2007 and 2008    Bilateral  . Cardiac catheterization   . Coronary angioplasty     History   Social History  . Marital Status: Divorced    Spouse Name: N/A    Number of Children: N/A  . Years of Education: N/A   Occupational History  . Not on file.   Social History Main Topics  . Smoking status: Current Everyday Smoker -- 0.5 packs/day for 40 years    Types: Cigarettes  . Smokeless tobacco: Never Used  . Alcohol Use: No  . Drug Use: No  . Sexually Active: Yes    Birth Control/ Protection: Post-menopausal   Other Topics Concern  . Not on file   Social History Narrative  . No narrative on file    Family History  Problem Relation Age of Onset  . Anesthesia problems Neg Hx   .   Hypotension Neg Hx   . Malignant hyperthermia Neg Hx   . Pseudochol deficiency Neg Hx   . Coronary artery disease Father     MI age 56    ROS: no nausea, vomiting; no fever, chills; no melena, hematochezia; no claudication  PHYSICAL EXAM: There were no vitals taken for this visit. GENERAL: ; NAD HEENT: NCAT, PERRLA, EOMI; sclera clear; no xanthelasma NECK: palpable bilateral carotid pulses, no bruits; no JVD; no TM LUNGS: CTA bilaterally CARDIAC: RRR (S1, S2); no significant murmurs; no rubs or gallops ABDOMEN: Protuberant EXTREMETIES: Palpable bilateral femoral pulses, without bruits; intact distal pulses; trace peripheral edema SKIN: warm/dry; no obvious rash/lesions MUSCULOSKELETAL: no joint deformity NEURO: no focal deficit; NL affect   EKG:   ASSESSMENT & PLAN:  

## 2011-11-28 NOTE — Patient Instructions (Addendum)
   Increase Imdur to 30mg  daily  Stop Tobacco  Left JV Cath - see info sheet given Follow up will be given at time of discharge from cath

## 2011-11-28 NOTE — Assessment & Plan Note (Signed)
We discussed the importance of smoking cessation 

## 2011-12-01 ENCOUNTER — Other Ambulatory Visit: Payer: Self-pay | Admitting: Physician Assistant

## 2011-12-01 ENCOUNTER — Telehealth: Payer: Self-pay | Admitting: *Deleted

## 2011-12-01 NOTE — Telephone Encounter (Signed)
Precert - Left JV Cath scheduled for Wednesday, 5/1 at 12:00 noon with Dr. Kirke Corin.   CHECKING PERCERT

## 2011-12-01 NOTE — Telephone Encounter (Signed)
No precert required.  Medicare, Medicaid °

## 2011-12-03 ENCOUNTER — Inpatient Hospital Stay (HOSPITAL_BASED_OUTPATIENT_CLINIC_OR_DEPARTMENT_OTHER)
Admission: RE | Admit: 2011-12-03 | Discharge: 2011-12-03 | Disposition: A | Discharge status: Home / Self Care | Payer: Medicare Other | Source: Ambulatory Visit | Attending: Cardiovascular Disease | Admitting: Cardiovascular Disease

## 2011-12-03 ENCOUNTER — Encounter (HOSPITAL_BASED_OUTPATIENT_CLINIC_OR_DEPARTMENT_OTHER)
Admission: RE | Disposition: A | Discharge status: Home / Self Care | Payer: Self-pay | Source: Ambulatory Visit | Attending: Cardiovascular Disease

## 2011-12-03 ENCOUNTER — Encounter (HOSPITAL_BASED_OUTPATIENT_CLINIC_OR_DEPARTMENT_OTHER): Payer: Self-pay | Admitting: *Deleted

## 2011-12-03 DIAGNOSIS — R079 Chest pain, unspecified: Secondary | ICD-10-CM | POA: Diagnosis not present

## 2011-12-03 DIAGNOSIS — R9439 Abnormal result of other cardiovascular function study: Secondary | ICD-10-CM | POA: Diagnosis not present

## 2011-12-03 DIAGNOSIS — I2510 Atherosclerotic heart disease of native coronary artery without angina pectoris: Secondary | ICD-10-CM | POA: Diagnosis not present

## 2011-12-03 DIAGNOSIS — Z9861 Coronary angioplasty status: Secondary | ICD-10-CM | POA: Insufficient documentation

## 2011-12-03 DIAGNOSIS — I252 Old myocardial infarction: Secondary | ICD-10-CM | POA: Diagnosis not present

## 2011-12-03 SURGERY — JV LEFT HEART CATHETERIZATION WITH CORONARY ANGIOGRAM
Anesthesia: Moderate Sedation

## 2011-12-03 MED ORDER — SODIUM CHLORIDE 0.9 % IJ SOLN: 3.0000 mL | Freq: Two times a day (BID) | INTRAMUSCULAR | Status: DC

## 2011-12-03 MED ORDER — SODIUM CHLORIDE 0.9 % IV SOLN: INTRAVENOUS | Status: AC

## 2011-12-03 MED ORDER — DIAZEPAM 5 MG PO TABS
5.0000 mg | ORAL_TABLET | ORAL | Status: AC
  Administered 2011-12-03: 5 mg via ORAL

## 2011-12-03 MED ORDER — SODIUM CHLORIDE 0.9 % IJ SOLN: 3.0000 mL | INTRAMUSCULAR | Status: DC | PRN

## 2011-12-03 MED ORDER — SODIUM CHLORIDE 0.9 % IV SOLN: 250.0000 mL | INTRAVENOUS | Status: DC | PRN

## 2011-12-03 MED ORDER — SODIUM CHLORIDE 0.9 % IV SOLN
INTRAVENOUS | Status: DC
  Administered 2011-12-03: 11:00:00 via INTRAVENOUS

## 2011-12-03 MED ORDER — ASPIRIN 81 MG PO CHEW
324.0000 mg | CHEWABLE_TABLET | ORAL | Status: AC
  Administered 2011-12-03: 324 mg via ORAL

## 2011-12-03 MED ORDER — ACETAMINOPHEN 325 MG PO TABS: 650.0000 mg | ORAL_TABLET | ORAL | Status: DC | PRN

## 2011-12-03 NOTE — H&P (View-Only) (Signed)
HPI: Patient presents as post hospital followup from [facility], status post CP admission with normal cardiac markers. Plan was for outpatient stress test, performed April 12, and reviewed by Dr. Diona Browner. This was a Scientist, physiological; EF 56%; large partially reversible defect c/w history apical/anterior apical scar with moderate peri-infarct ischemia, mainly AS/AL distribution.  Clinically, patient has not had any recurrent CP, since her recent hospitalization. Her precipitating episode, however, was quite worrisome, and reminiscent of prior presentations. She developed significant anterior chest discomfort, referred to as "pressure" (10/10), with radiation to the neck. She took NTG spray at home, with no effect, and presented to [facility] ED. She was placed on IV heparin, transferred to [facility], and ruled out for MI with normal cardiac markers.   No Known Allergies  Current Outpatient Prescriptions  Medication Sig Dispense Refill  . aspirin EC 81 MG EC tablet Take 1 tablet (81 mg total) by mouth daily.      Marland Kitchen atorvastatin (LIPITOR) 80 MG tablet Take 80 mg by mouth daily.      . clopidogrel (PLAVIX) 75 MG tablet Take 1 tablet (75 mg total) by mouth daily.  30 tablet  1  . esomeprazole (NEXIUM) 40 MG capsule Take 40 mg by mouth daily before breakfast.      . FLUoxetine (PROZAC) 40 MG capsule Take 40 mg by mouth daily.      . isosorbide mononitrate (IMDUR) 30 MG 24 hr tablet Take 15 mg by mouth daily.      Marland Kitchen levothyroxine (SYNTHROID, LEVOTHROID) 125 MCG tablet Take 125 mcg by mouth daily.      . metoprolol (LOPRESSOR) 50 MG tablet Take 50 mg by mouth 2 (two) times daily.      . nitroGLYCERIN (NITROSTAT) 0.4 MG SL tablet Place 1 tablet (0.4 mg total) under the tongue every 5 (five) minutes x 3 doses as needed for chest pain.  25 tablet  3    Past Medical History  Diagnosis Date  . Anxiety   . Depression     Prior suicide attempt  . GERD (gastroesophageal reflux disease)   . Essential hypertension, benign   .  Hypothyroidism   . Fibromyalgia   . Asthma   . Coronary atherosclerosis of native coronary artery     Nonobstructive circ/RCA, DES LAD/diagonal 2005, LVEF 60%  . Palpitations     PVC's have been noted  . NSTEMI (non-ST elevated myocardial infarction) 2005    Past Surgical History  Procedure Date  . Tubal ligation   . Foot bone excision     Sesmoid bone left foot-repair of fracture  . Carpal tunnel release 2007 and 2008    Bilateral  . Cardiac catheterization   . Coronary angioplasty     History   Social History  . Marital Status: Divorced    Spouse Name: N/A    Number of Children: N/A  . Years of Education: N/A   Occupational History  . Not on file.   Social History Main Topics  . Smoking status: Current Everyday Smoker -- 0.5 packs/day for 40 years    Types: Cigarettes  . Smokeless tobacco: Never Used  . Alcohol Use: No  . Drug Use: No  . Sexually Active: Yes    Birth Control/ Protection: Post-menopausal   Other Topics Concern  . Not on file   Social History Narrative  . No narrative on file    Family History  Problem Relation Age of Onset  . Anesthesia problems Neg Hx   .  Hypotension Neg Hx   . Malignant hyperthermia Neg Hx   . Pseudochol deficiency Neg Hx   . Coronary artery disease Father     MI age 32    ROS: no nausea, vomiting; no fever, chills; no melena, hematochezia; no claudication  PHYSICAL EXAM: There were no vitals taken for this visit. GENERAL: ; NAD HEENT: NCAT, PERRLA, EOMI; sclera clear; no xanthelasma NECK: palpable bilateral carotid pulses, no bruits; no JVD; no TM LUNGS: CTA bilaterally CARDIAC: RRR (S1, S2); no significant murmurs; no rubs or gallops ABDOMEN: Protuberant EXTREMETIES: Palpable bilateral femoral pulses, without bruits; intact distal pulses; trace peripheral edema SKIN: warm/dry; no obvious rash/lesions MUSCULOSKELETAL: no joint deformity NEURO: no focal deficit; NL affect   EKG:   ASSESSMENT & PLAN:

## 2011-12-03 NOTE — Progress Notes (Signed)
Bedrest begins 1250.

## 2011-12-03 NOTE — CV Procedure (Signed)
    Cardiac Catheterization Procedure Note  Name: [name] MRN: [id number] DOB: [birth year]  Procedure: Left Heart Cath, Selective Coronary Angiography, LV angiography  Indication: Chest pain with abnormal stress test. Previous history of anterior myocardial infarction in 2005 with bifurcation stenting of the LAD/diagonal using the crush technique.   Medications:  Sedation:  1 mg IV Versed, 25 mcg IV Fentanyl  Contrast:  60 mL Omnipaque  Procedural details: The right groin was prepped, draped, and anesthetized with 1% lidocaine. Using modified Seldinger technique, a 4 French sheath was introduced into the right femoral artery. Standard Judkins catheters were used for coronary angiography and left ventriculography. Catheter exchanges were performed over a guidewire. There were no immediate procedural complications. The patient was transferred to the post catheterization recovery area for further monitoring.   Procedural Findings:  Hemodynamics: AO:  116/59   mmHg LV:  116/15    mmHg LVEDP: 18  mmHg  Coronary angiography: Coronary dominance: Right   Left Main:  Normal in size with 10% distal stenosis at the bifurcation.  Left Anterior Descending (LAD):  Medium in size. An overlapped bifurcation proximal LAD/diagonal stents are noted ( crush technique) without significant restenosis. The diagonal system is actually much larger than the LAD itself which continues as mostly 2 septal perforators. The mid and distal LAD has minor atherosclerosis without obstructive disease.  1st diagonal (D1):  Very large in size and supplies most of the LAD distribution. The stent is noted proximally which is patent without significant restenosis. In the midsegment, there is a 40% tubular stenosis.  2nd diagonal (D2):  Normal in size without significant disease.  Circumflex (LCx):  Normal in size and nondominant. There is a 20% ostial disease. The rest of the vessel has minor  irregularities.  1st obtuse marginal:  Normal in size with 30% ostial stenosis.  2nd obtuse marginal:  Normal in size and free of significant disease.  3rd obtuse marginal:  Normal in size and free of significant disease.   Right Coronary Artery: Large in size and dominant. There is diffuse 20% disease proximally. Otherwise no evidence of obstructive disease.  posterior descending artery: Large in size and reaches the apex. It has no significant disease.  posterior lateral branch:  No significant disease.  Left ventriculography: Left ventricular systolic function is low normal , LVEF is estimated at 50-55 %, there is no significant mitral regurgitation . There is moderate mid to distal anterolateral hypokinesis.  Final Conclusions:  1. Patent LAD/diagonal stents without significant restenosis. Mild CAD without evidence of obstructive disease at this time. 2. Low normal LV systolic function with an estimated ejection fraction of 50-55% with moderate anterolateral hypokinesis. 3. Mildly elevated left ventricular end-diastolic pressure.  Recommendations:  Continue medical therapy. Lifelong dual antiplatelet therapy is recommended.  [name] MD, [facility] 12/03/2011, 12:39 PM

## 2011-12-03 NOTE — Interval H&P Note (Signed)
History and Physical Interval Note:  12/03/2011 12:06 PM  [name]  has presented today for surgery, with the diagnosis of chest pain  The various methods of treatment have been discussed with the patient. After consideration of risks, benefits and other options for treatment, the patient has consented to  Procedure(s) (LRB): JV LEFT HEART CATHETERIZATION WITH CORONARY ANGIOGRAM (N/A) as a surgical intervention .  The patients' history has been reviewed, patient examined, no change in status, stable for surgery.  I have reviewed the patients' chart and labs.  Questions were answered to the patient's satisfaction.     [name]

## 2011-12-24 ENCOUNTER — Ambulatory Visit (INDEPENDENT_AMBULATORY_CARE_PROVIDER_SITE_OTHER): Payer: Medicare Other | Admitting: Physician Assistant

## 2011-12-24 ENCOUNTER — Encounter: Payer: Self-pay | Admitting: Physician Assistant

## 2011-12-24 VITALS — BP 91/63 | HR 65 | Resp 18 | Ht 66.0 in | Wt 224.0 lb

## 2011-12-24 DIAGNOSIS — I2510 Atherosclerotic heart disease of native coronary artery without angina pectoris: Secondary | ICD-10-CM

## 2011-12-24 DIAGNOSIS — E785 Hyperlipidemia, unspecified: Secondary | ICD-10-CM | POA: Diagnosis not present

## 2011-12-24 MED ORDER — ISOSORBIDE MONONITRATE ER 30 MG PO TB24: 15.0000 mg | ORAL_TABLET | Freq: Every day | ORAL | Status: DC

## 2011-12-24 NOTE — Patient Instructions (Signed)
   Decrease Imdur to 15mg  daily (1/2 tab of 30mg  tablet)  Your physician wants you to follow up in: 6 months.  You will receive a reminder letter in the mail one-two months in advance.  If you don't receive a letter, please call our office to schedule the follow up appointment

## 2011-12-24 NOTE — Progress Notes (Signed)
HPI: Patient presents for post catheterization followup.  We referred her for an elective cath, following a recent post hospital followup Lexiscan Myoview suggestive of moderate peri-infarct ischemia. Patient had just ruled out for MI at [facility], following initial transfer from [facility] ED. When I saw recently for post hospital followup, she denied any recurrent CP, but described a worrisome initial presentation, reminiscent of a prior presentation which resulted in PCI.  Cardiac catheterization, however, yielded no significant CAD with patent LAD/DX stents, with no significant restenosis. There was otherwise mild, residual disease; EF 50-55%, moderate AL HK, mildly elevated LVEDP. Lifelong DAPT was recommended.  Clinically, she continues to do well, reporting no recurrent CP since her initial discharge from [facility] several weeks ago. Of note, no medication adjustments were made at discharge; however, I did increase her Imdur to the current dose of 30 mg daily, at time of her post hospital followup. This is in the context of, per her report, chronic hypotension. No other medication adjustments were recently made.  We reviewed the results of her recent catheterization. Patient reports no complications of the R. groin incision site.  No Known Allergies  Current Outpatient Prescriptions  Medication Sig Dispense Refill  . aspirin EC 81 MG EC tablet Take 1 tablet (81 mg total) by mouth daily.      Marland Kitchen atorvastatin (LIPITOR) 80 MG tablet Take 80 mg by mouth daily.      . clopidogrel (PLAVIX) 75 MG tablet Take 1 tablet (75 mg total) by mouth daily.  30 tablet  1  . esomeprazole (NEXIUM) 40 MG capsule Take 40 mg by mouth daily before breakfast.      . isosorbide mononitrate (IMDUR) 30 MG 24 hr tablet Take 1 tablet (30 mg total) by mouth daily.  30 tablet  6  . levothyroxine (SYNTHROID, LEVOTHROID) 125 MCG tablet Take 125 mcg by mouth. (1/2 tablet on Monday and 1 tablet all other days)      . metoprolol (LOPRESSOR) 50  MG tablet Take 50 mg by mouth 2 (two) times daily.      . nitroGLYCERIN (NITROSTAT) 0.4 MG SL tablet Place 1 tablet (0.4 mg total) under the tongue every 5 (five) minutes x 3 doses as needed for chest pain.  25 tablet  3    Past Medical History  Diagnosis Date  . Anxiety   . Depression     Prior suicide attempt  . GERD (gastroesophageal reflux disease)   . Essential hypertension, benign   . Hypothyroidism   . Fibromyalgia   . Asthma   . Coronary atherosclerosis of native coronary artery     Nonobstructive circ/RCA, DES LAD/diagonal 2005, LVEF 60%  . Palpitations     PVC's have been noted  . NSTEMI (non-ST elevated myocardial infarction) 2005    Past Surgical History  Procedure Date  . Tubal ligation   . Foot bone excision     Sesmoid bone left foot-repair of fracture  . Carpal tunnel release 2007 and 2008    Bilateral  . Cardiac catheterization   . Coronary angioplasty     History   Social History  . Marital Status: Divorced    Spouse Name: N/A    Number of Children: N/A  . Years of Education: N/A   Occupational History  . Not on file.   Social History Main Topics  . Smoking status: Current Everyday Smoker -- 0.3 packs/day for 40 years    Types: Cigarettes  . Smokeless tobacco: Never Used  . Alcohol  Use: No  . Drug Use: No  . Sexually Active: Yes    Birth Control/ Protection: Post-menopausal   Other Topics Concern  . Not on file   Social History Narrative  . No narrative on file    Family History  Problem Relation Age of Onset  . Anesthesia problems Neg Hx   . Hypotension Neg Hx   . Malignant hyperthermia Neg Hx   . Pseudochol deficiency Neg Hx   . Coronary artery disease Father     MI age 28    ROS: no nausea, vomiting; no fever, chills; no melena, hematochezia; no claudication  PHYSICAL EXAM: BP 91/63  Pulse 65  Resp 18  Ht 5\' 6"  (1.676 m)  Wt 224 lb (101.606 kg)  BMI 36.15 kg/m2 GENERAL: ; NAD  HEENT: NCAT, PERRLA, EOMI; sclera clear;  no xanthelasma  NECK: palpable bilateral carotid pulses, no bruits; no JVD; no TM  LUNGS: CTA bilaterally  CARDIAC: RRR (S1, S2); no significant murmurs; no rubs or gallops  ABDOMEN: Protuberant  EXTREMETIES: trace peripheral edema  SKIN: warm/dry; no obvious rash/lesions  MUSCULOSKELETAL: no joint deformity  NEURO: no focal deficit; NL affect    EKG:    ASSESSMENT & PLAN:  No problem-specific assessment & plan notes found for this encounter.   Gene Maira Christon, PAC

## 2011-12-24 NOTE — Assessment & Plan Note (Signed)
Followed by primary M.D. Continue high dose Lipitor. Aggressive management recommended with target LDL 70 or less, if feasible.

## 2011-12-24 NOTE — Assessment & Plan Note (Addendum)
Aggressive secondary prevention measures recommended. Results of recent elective cardiac catheterization were reviewed. She is to remain on DAPT, indefinitely. In light of her chronic hypotension, I will reduce her Imdur back to her previous dose of 15 mg daily, which she has been on for several years. She reports having been told in the past that she might have "spasm". Therefore, despite results of most recent catheterization report, I have elected to continue her on this medication, in the event that she may still be experiencing transient coronary vasospasm. Will plan on having her reestablish here in the [facility] clinic with Dr. Diona Browner, in approximately 6 months.

## 2011-12-25 ENCOUNTER — Other Ambulatory Visit: Payer: Self-pay | Admitting: Cardiology

## 2012-01-07 DIAGNOSIS — T07XXXA Unspecified multiple injuries, initial encounter: Secondary | ICD-10-CM | POA: Diagnosis not present

## 2012-01-07 DIAGNOSIS — IMO0001 Reserved for inherently not codable concepts without codable children: Secondary | ICD-10-CM | POA: Diagnosis not present

## 2012-01-07 DIAGNOSIS — G4700 Insomnia, unspecified: Secondary | ICD-10-CM | POA: Diagnosis not present

## 2012-01-22 DIAGNOSIS — M25579 Pain in unspecified ankle and joints of unspecified foot: Secondary | ICD-10-CM | POA: Diagnosis not present

## 2012-02-10 ENCOUNTER — Encounter: Payer: Self-pay | Admitting: Cardiology

## 2012-02-23 ENCOUNTER — Other Ambulatory Visit: Payer: Self-pay | Admitting: Cardiology

## 2012-02-23 MED ORDER — CLOPIDOGREL BISULFATE 75 MG PO TABS: 75.0000 mg | ORAL_TABLET | Freq: Every day | ORAL | Status: DC

## 2012-03-03 DIAGNOSIS — L909 Atrophic disorder of skin, unspecified: Secondary | ICD-10-CM | POA: Diagnosis not present

## 2012-03-03 DIAGNOSIS — L919 Hypertrophic disorder of the skin, unspecified: Secondary | ICD-10-CM | POA: Diagnosis not present

## 2012-03-03 DIAGNOSIS — L821 Other seborrheic keratosis: Secondary | ICD-10-CM | POA: Diagnosis not present

## 2012-03-03 DIAGNOSIS — L819 Disorder of pigmentation, unspecified: Secondary | ICD-10-CM | POA: Diagnosis not present

## 2012-03-27 ENCOUNTER — Other Ambulatory Visit: Payer: Self-pay | Admitting: Cardiology

## 2012-04-06 DIAGNOSIS — F331 Major depressive disorder, recurrent, moderate: Secondary | ICD-10-CM | POA: Diagnosis not present

## 2012-04-20 DIAGNOSIS — F331 Major depressive disorder, recurrent, moderate: Secondary | ICD-10-CM | POA: Diagnosis not present

## 2012-05-06 DIAGNOSIS — J069 Acute upper respiratory infection, unspecified: Secondary | ICD-10-CM | POA: Diagnosis not present

## 2012-05-06 DIAGNOSIS — J209 Acute bronchitis, unspecified: Secondary | ICD-10-CM | POA: Diagnosis not present

## 2012-05-24 DIAGNOSIS — J069 Acute upper respiratory infection, unspecified: Secondary | ICD-10-CM | POA: Diagnosis not present

## 2012-05-24 DIAGNOSIS — J45909 Unspecified asthma, uncomplicated: Secondary | ICD-10-CM | POA: Diagnosis not present

## 2012-06-11 DIAGNOSIS — H2510 Age-related nuclear cataract, unspecified eye: Secondary | ICD-10-CM | POA: Diagnosis not present

## 2012-06-11 DIAGNOSIS — H5200 Hypermetropia, unspecified eye: Secondary | ICD-10-CM | POA: Diagnosis not present

## 2012-06-11 DIAGNOSIS — H524 Presbyopia: Secondary | ICD-10-CM | POA: Diagnosis not present

## 2012-06-22 DIAGNOSIS — J019 Acute sinusitis, unspecified: Secondary | ICD-10-CM | POA: Diagnosis not present

## 2012-06-22 DIAGNOSIS — J42 Unspecified chronic bronchitis: Secondary | ICD-10-CM | POA: Diagnosis not present

## 2012-06-25 ENCOUNTER — Encounter: Payer: Self-pay | Admitting: *Deleted

## 2012-06-25 NOTE — Telephone Encounter (Signed)
This encounter was created in error - please disregard.

## 2012-09-02 DIAGNOSIS — IMO0002 Reserved for concepts with insufficient information to code with codable children: Secondary | ICD-10-CM | POA: Diagnosis not present

## 2012-10-20 ENCOUNTER — Encounter: Payer: Self-pay | Admitting: Nurse Practitioner

## 2012-10-20 ENCOUNTER — Ambulatory Visit (INDEPENDENT_AMBULATORY_CARE_PROVIDER_SITE_OTHER): Payer: Medicare Other | Admitting: Nurse Practitioner

## 2012-10-20 VITALS — BP 120/68

## 2012-10-20 DIAGNOSIS — L739 Follicular disorder, unspecified: Secondary | ICD-10-CM

## 2012-10-20 DIAGNOSIS — L738 Other specified follicular disorders: Secondary | ICD-10-CM | POA: Diagnosis not present

## 2012-10-20 DIAGNOSIS — L678 Other hair color and hair shaft abnormalities: Secondary | ICD-10-CM

## 2012-10-20 MED ORDER — DOXYCYCLINE HYCLATE 100 MG PO CAPS: 100.0000 mg | ORAL_CAPSULE | Freq: Two times a day (BID) | ORAL | Status: DC

## 2012-10-20 NOTE — Progress Notes (Signed)
Subjective: Presents for complaints of 3 lesions in the left axillary area they came up within the past 2 days. Slightly tender. Also one area on the left breast which has opened up and is improving. No fever. No other rash. Has a history of abscesses in the axillary area but none for awhile. Objective: NAD. Alert, oriented. Lungs clear. Heart regular rate rhythm. 3 mildly erythematous raised pustules noted in the left axillary area, mildly tender to palpation. One small open area noted on the left breast. Assessment: Folliculitis Plan: Doxycycline 100 mg 1 by mouth twice a day when necessary.  continue warm compresses. Warning signs reviewed. Recheck if worsens or persists. Cautioned about sun exposure with doxycycline.

## 2012-12-15 ENCOUNTER — Encounter: Payer: Self-pay | Admitting: *Deleted

## 2012-12-16 ENCOUNTER — Telehealth: Payer: Self-pay | Admitting: Family Medicine

## 2012-12-16 ENCOUNTER — Other Ambulatory Visit: Payer: Self-pay

## 2012-12-16 DIAGNOSIS — Z79899 Other long term (current) drug therapy: Secondary | ICD-10-CM

## 2012-12-16 DIAGNOSIS — E782 Mixed hyperlipidemia: Secondary | ICD-10-CM

## 2012-12-16 DIAGNOSIS — E039 Hypothyroidism, unspecified: Secondary | ICD-10-CM

## 2012-12-16 NOTE — Telephone Encounter (Signed)
Patient needs BW paperwork mailed to her

## 2012-12-16 NOTE — Telephone Encounter (Signed)
I recommend metabolic 7, and TSH, lipid profile, liver profile. Reason: Hyperlipidemia, hypothyroidism, heart disease.she will also need followup visit after this to discuss them.

## 2012-12-16 NOTE — Telephone Encounter (Signed)
Bloodwork papers done. Sent upfront to be mailed to patient. Left message on voicemail notifying patient.

## 2012-12-17 ENCOUNTER — Other Ambulatory Visit: Payer: Self-pay | Admitting: Family Medicine

## 2012-12-24 ENCOUNTER — Encounter: Payer: Self-pay | Admitting: Cardiology

## 2012-12-24 DIAGNOSIS — E782 Mixed hyperlipidemia: Secondary | ICD-10-CM | POA: Diagnosis not present

## 2012-12-24 DIAGNOSIS — E039 Hypothyroidism, unspecified: Secondary | ICD-10-CM | POA: Diagnosis not present

## 2012-12-24 DIAGNOSIS — Z79899 Other long term (current) drug therapy: Secondary | ICD-10-CM | POA: Diagnosis not present

## 2012-12-29 ENCOUNTER — Ambulatory Visit (INDEPENDENT_AMBULATORY_CARE_PROVIDER_SITE_OTHER): Payer: Medicare Other | Admitting: Family Medicine

## 2012-12-29 ENCOUNTER — Encounter: Payer: Self-pay | Admitting: Family Medicine

## 2012-12-29 VITALS — BP 130/80 | HR 70 | Ht 66.0 in | Wt 225.5 lb

## 2012-12-29 DIAGNOSIS — E039 Hypothyroidism, unspecified: Secondary | ICD-10-CM | POA: Diagnosis not present

## 2012-12-29 DIAGNOSIS — IMO0001 Reserved for inherently not codable concepts without codable children: Secondary | ICD-10-CM

## 2012-12-29 DIAGNOSIS — I10 Essential (primary) hypertension: Secondary | ICD-10-CM | POA: Diagnosis not present

## 2012-12-29 DIAGNOSIS — E785 Hyperlipidemia, unspecified: Secondary | ICD-10-CM | POA: Diagnosis not present

## 2012-12-29 DIAGNOSIS — L409 Psoriasis, unspecified: Secondary | ICD-10-CM | POA: Insufficient documentation

## 2012-12-29 DIAGNOSIS — L408 Other psoriasis: Secondary | ICD-10-CM

## 2012-12-29 DIAGNOSIS — M797 Fibromyalgia: Secondary | ICD-10-CM | POA: Insufficient documentation

## 2012-12-29 MED ORDER — CLOBETASOL PROPIONATE 0.05 % EX LIQD: Freq: Two times a day (BID) | CUTANEOUS | Status: DC

## 2012-12-29 MED ORDER — DULOXETINE HCL 60 MG PO CPEP: 60.0000 mg | ORAL_CAPSULE | Freq: Every day | ORAL | Status: DC

## 2012-12-29 MED ORDER — ALPRAZOLAM ER 1 MG PO TB24: 1.0000 mg | ORAL_TABLET | Freq: Every day | ORAL | Status: DC | PRN

## 2012-12-29 NOTE — Progress Notes (Signed)
  Subjective:    Patient ID: [name], female    DOB: 10-Mar-1956, 57 y.o.   MRN: [id number]  Hyperlipidemia This is a chronic problem. The current episode started more than 1 year ago. The problem is controlled. There are no known factors aggravating her hyperlipidemia. Associated symptoms include myalgias. Pertinent negatives include no chest pain. Treatments tried: lipitor. The current treatment provides moderate improvement of lipids. There are no compliance problems.  There are no known risk factors for coronary artery disease.  Patient here for follow up on fibromyalgia and anxiety also. Muscles sore as well. Tried Lyrica in past had swelling.  Tried Celebrex in past.  Hx Fibromyalgia  Review of Systems  Constitutional: Negative for activity change, appetite change and fatigue.  HENT: Negative for congestion, rhinorrhea, neck pain and ear discharge.   Eyes: Negative for discharge.  Respiratory: Negative for cough, chest tightness and wheezing.   Cardiovascular: Negative for chest pain.  Gastrointestinal: Negative for vomiting and abdominal pain.  Genitourinary: Negative for frequency and difficulty urinating.  Musculoskeletal: Positive for myalgias.  Allergic/Immunologic: Negative for environmental allergies and food allergies.  Neurological: Negative for weakness and headaches.  Psychiatric/Behavioral: Negative for behavioral problems and agitation.   Pain -Feet mainly in legs, sometimes with leg weakness, skin sore Nothing makes it worse except walking, gets stiff with sitting     Objective:   Physical Exam  Constitutional: She is oriented to person, place, and time. She appears well-developed and well-nourished.  HENT:  Head: Normocephalic.  Right Ear: External ear normal.  Left Ear: External ear normal.  Eyes: Pupils are equal, round, and reactive to light.  Neck: Normal range of motion. No thyromegaly present.  Cardiovascular: Normal rate, regular rhythm, normal  heart sounds and intact distal pulses.   No murmur heard. Pulmonary/Chest: Effort normal and breath sounds normal. No respiratory distress. She has no wheezes.  Abdominal: Soft. Bowel sounds are normal. She exhibits no distension and no mass. There is no tenderness.  Musculoskeletal: Normal range of motion. She exhibits tenderness (muscles tender in legs). She exhibits no edema.  Lymphadenopathy:    She has no cervical adenopathy.  Neurological: She is alert and oriented to person, place, and time. She exhibits normal muscle tone.  Skin: Skin is warm and dry.  Psychiatric: She has a normal mood and affect. Her behavior is normal.          Assessment & Plan:  Lipid- continue as is, Lipitor causibg some pain but pt wants to cintinue to decrease MI risk hypothy- tsh good keep as is Weight- diet and exercise discussed Fibromyalgia- try Cymbalta in place of prozac for 2 months fu then Nerves- xanax 1 mg prn, not for frequent use

## 2013-01-12 ENCOUNTER — Other Ambulatory Visit: Payer: Self-pay | Admitting: Family Medicine

## 2013-01-12 ENCOUNTER — Other Ambulatory Visit: Payer: Self-pay | Admitting: Cardiology

## 2013-01-12 ENCOUNTER — Other Ambulatory Visit: Payer: Self-pay | Admitting: Physician Assistant

## 2013-01-26 ENCOUNTER — Other Ambulatory Visit: Payer: Self-pay | Admitting: Cardiology

## 2013-02-02 ENCOUNTER — Telehealth: Payer: Self-pay | Admitting: Family Medicine

## 2013-02-02 NOTE — Telephone Encounter (Signed)
Stop Cymbalta.  May restart Prozac at previous dose.  Follow-up as planned. We can discuss other options for fibromyalgia at next office visit.

## 2013-02-02 NOTE — Telephone Encounter (Signed)
Patient says that the Cymbalta she has been taking for a month is having bad side effects-vivid bad dreams.  Please advise.

## 2013-02-02 NOTE — Telephone Encounter (Signed)
Discussed with patient. Patient stated she needs a refill of the Prozac sent into the pharmacy.

## 2013-02-03 ENCOUNTER — Other Ambulatory Visit: Payer: Self-pay | Admitting: Nurse Practitioner

## 2013-02-03 MED ORDER — FLUOXETINE HCL 40 MG PO CAPS: 40.0000 mg | ORAL_CAPSULE | Freq: Every day | ORAL | Status: DC

## 2013-03-07 ENCOUNTER — Ambulatory Visit (INDEPENDENT_AMBULATORY_CARE_PROVIDER_SITE_OTHER): Payer: Medicare Other | Admitting: Family Medicine

## 2013-03-07 ENCOUNTER — Encounter: Payer: Self-pay | Admitting: Family Medicine

## 2013-03-07 VITALS — BP 138/88 | HR 70 | Ht 66.0 in | Wt 229.0 lb

## 2013-03-07 DIAGNOSIS — IMO0001 Reserved for inherently not codable concepts without codable children: Secondary | ICD-10-CM

## 2013-03-07 DIAGNOSIS — I2510 Atherosclerotic heart disease of native coronary artery without angina pectoris: Secondary | ICD-10-CM | POA: Diagnosis not present

## 2013-03-07 DIAGNOSIS — F17200 Nicotine dependence, unspecified, uncomplicated: Secondary | ICD-10-CM

## 2013-03-07 DIAGNOSIS — E782 Mixed hyperlipidemia: Secondary | ICD-10-CM | POA: Diagnosis not present

## 2013-03-07 DIAGNOSIS — M797 Fibromyalgia: Secondary | ICD-10-CM

## 2013-03-07 MED ORDER — ALPRAZOLAM 0.25 MG PO TABS: 0.2500 mg | ORAL_TABLET | Freq: Three times a day (TID) | ORAL | Status: DC | PRN

## 2013-03-07 MED ORDER — HYDROCODONE-ACETAMINOPHEN 10-325 MG PO TABS: 1.0000 | ORAL_TABLET | Freq: Four times a day (QID) | ORAL | Status: DC | PRN

## 2013-03-07 MED ORDER — LEVOTHYROXINE SODIUM 125 MCG PO TABS: ORAL_TABLET | ORAL | Status: DC

## 2013-03-07 MED ORDER — CYCLOBENZAPRINE HCL 10 MG PO TABS: 10.0000 mg | ORAL_TABLET | Freq: Three times a day (TID) | ORAL | Status: DC | PRN

## 2013-03-07 NOTE — Progress Notes (Signed)
  Subjective:    Patient ID: [name], female    DOB: 26-Jan-1956, 57 y.o.   MRN: [id number]  HPI Here to follow up on medications. Pt does not have any new concerns. She has a history of heart disease also a history of arthritis she's had carpal tunnel surgery in the past she complains of hand pain and stiffness and discomfort she denies any other particular troubles currently She does smoke she knows she needs to quit  Review of Systems  Constitutional: Negative for fatigue.  Respiratory: Negative for cough and chest tightness.   Cardiovascular: Negative for chest pain and leg swelling.  Gastrointestinal: Negative for abdominal pain.       Objective:   Physical Exam  Constitutional: She appears well-developed.  HENT:  Head: Normocephalic.  Cardiovascular: Normal rate, regular rhythm and normal heart sounds.   No murmur heard. Blood pressure lower than need be. 104/70  Pulmonary/Chest: Effort normal and breath sounds normal.  Musculoskeletal: She exhibits no edema.   Blood pressure error. See vitals-ignore the above        Assessment & Plan:  htn-keep as is Cardiac f/u -she will call cardio Hypothyroid- refills given recheck 6 months Arthralgias - pain refills givern uses meds sporadically Insomnia- xanax refills given- uses prn

## 2013-03-14 ENCOUNTER — Other Ambulatory Visit: Payer: Self-pay | Admitting: Physician Assistant

## 2013-03-23 ENCOUNTER — Other Ambulatory Visit: Payer: Self-pay | Admitting: Physician Assistant

## 2013-04-12 ENCOUNTER — Other Ambulatory Visit: Payer: Self-pay | Admitting: *Deleted

## 2013-04-12 MED ORDER — ATORVASTATIN CALCIUM 80 MG PO TABS: 80.0000 mg | ORAL_TABLET | Freq: Every day | ORAL | Status: DC

## 2013-04-20 ENCOUNTER — Other Ambulatory Visit: Payer: Self-pay | Admitting: Physician Assistant

## 2013-05-10 ENCOUNTER — Encounter: Payer: Self-pay | Admitting: Cardiology

## 2013-05-10 ENCOUNTER — Ambulatory Visit (INDEPENDENT_AMBULATORY_CARE_PROVIDER_SITE_OTHER): Payer: Medicare Other | Admitting: Cardiology

## 2013-05-10 VITALS — BP 113/74 | HR 60 | Ht 66.0 in | Wt 226.0 lb

## 2013-05-10 DIAGNOSIS — I10 Essential (primary) hypertension: Secondary | ICD-10-CM | POA: Diagnosis not present

## 2013-05-10 DIAGNOSIS — I2510 Atherosclerotic heart disease of native coronary artery without angina pectoris: Secondary | ICD-10-CM

## 2013-05-10 DIAGNOSIS — E782 Mixed hyperlipidemia: Secondary | ICD-10-CM | POA: Diagnosis not present

## 2013-05-10 DIAGNOSIS — F17200 Nicotine dependence, unspecified, uncomplicated: Secondary | ICD-10-CM | POA: Diagnosis not present

## 2013-05-10 NOTE — Assessment & Plan Note (Signed)
Blood pressure is normal today. No changes made. 

## 2013-05-10 NOTE — Patient Instructions (Addendum)

## 2013-05-10 NOTE — Progress Notes (Signed)
Clinical Summary Ms. Biss is a 57 y.o.female last seen in May 2013 by Mr. Serpe PA-C. She states that she has been doing very well overall. She reports no regular angina symptoms. Indicates compliance with her cardiac medications.  Cardiac catheterization in May 2013 revealed patent LAD and diagonal stents with otherwise mild nonobstructive atherosclerosis, LVEF 50-55%.  Lab work in May of this year showed BUN 13, creatinine 0.7, AST 17, ALT 15, triglycerides 86, cholesterol 131, HDL 45, LDL 69, potassium 3.7, TSH 2.6.  She has been staying busy with motorcycle trips.   ECG today reviewed showing sinus rhythm low voltage, no acute ST segment changes, single PVC.  No Known Allergies  Current Outpatient Prescriptions  Medication Sig Dispense Refill  . ALPRAZolam (XANAX) 0.25 MG tablet Take 1 tablet (0.25 mg total) by mouth 3 (three) times daily as needed for anxiety.  30 tablet  2  . atorvastatin (LIPITOR) 80 MG tablet Take 1 tablet (80 mg total) by mouth daily.  90 tablet  0  . clopidogrel (PLAVIX) 75 MG tablet TAKE 1 TABLET ONCE DAILY.  30 tablet  3  . cyclobenzaprine (FLEXERIL) 10 MG tablet Take 1 tablet (10 mg total) by mouth every 8 (eight) hours as needed for muscle spasms.  30 tablet  1  . esomeprazole (NEXIUM) 40 MG capsule Take 40 mg by mouth daily before breakfast.      . FLUoxetine (PROZAC) 40 MG capsule Take 1 capsule (40 mg total) by mouth daily.  30 capsule  5  . HYDROcodone-acetaminophen (NORCO) 10-325 MG per tablet Take 1 tablet by mouth every 6 (six) hours as needed for pain.  30 tablet  2  . isosorbide mononitrate (IMDUR) 30 MG 24 hr tablet TAKE 1/2 TABLET DAILY.  15 tablet  1  . levothyroxine (SYNTHROID, LEVOTHROID) 125 MCG tablet TAKE 1 TABLET ONCE DAILY.  30 tablet  5  . metoprolol (LOPRESSOR) 50 MG tablet TAKE (1) TABLET TWICE DAILY.  30 tablet  2  . nitroGLYCERIN (NITROSTAT) 0.4 MG SL tablet Place 1 tablet (0.4 mg total) under the tongue every 5 (five) minutes x  3 doses as needed for chest pain.  25 tablet  3   No current facility-administered medications for this visit.    Past Medical History  Diagnosis Date  . Anxiety   . Depression     Prior suicide attempt  . GERD (gastroesophageal reflux disease)   . Essential hypertension, benign   . Hypothyroidism   . Asthma   . Coronary atherosclerosis of native coronary artery     Nonobstructive circ/RCA, DES LAD/diagonal 2005, LVEF 60%  . Palpitations     PVC's have been noted  . NSTEMI (non-ST elevated myocardial infarction) 2005  . Fibromyalgia     Social History Ms. Israelson reports that she has been smoking Cigarettes.  She has a 12 pack-year smoking history. She has never used smokeless tobacco. Ms. Kirtley reports that she does not drink alcohol.  Review of Systems No palpitations or syncope. No orthopnea or PND. No bleeding problems. Otherwise negative.  Physical Examination Filed Vitals:   05/10/13 0852  BP: 113/74  Pulse: 60   Filed Weights   05/10/13 0852  Weight: 226 lb (102.513 kg)   Overweight woman, appears comfortable. HEENT: Conjunctiva and lids normal, oropharynx clear. Neck: Supple, no elevated JVP or carotid bruits, no thyromegaly. Lungs: Clear to auscultation, nonlabored breathing at rest. Cardiac: Regular rate and rhythm, no S3 or significant systolic murmur, no pericardial  rub. Abdomen: Soft, nontender, bowel sounds present. Extremities: No pitting edema, distal pulses 2+. Skin: Warm and dry. Tattoos extending up both arms. Musculoskeletal: No kyphosis. Neuropsychiatric: Alert and oriented x3, affect grossly appropriate.   Problem List and Plan   CORONARY ATHEROSCLEROSIS NATIVE CORONARY ARTERY Symptomatically stable on medical therapy. Cardiac catheterization results from last year reviewed. Encouraged regular exercise, compliance with medications. Followup arranged in 6 months.  Essential hypertension, benign Blood pressure is normal today. No changes  made.  Mixed hyperlipidemia Recent lipid numbers reviewed, LDL at goal.  TOBACCO ABUSE Continue to encourage smoking cessation.    Jonelle Sidle, M.D., F.A.C.C.

## 2013-05-10 NOTE — Assessment & Plan Note (Signed)
Symptomatically stable on medical therapy. Cardiac catheterization results from last year reviewed. Encouraged regular exercise, compliance with medications. Followup arranged in 6 months.

## 2013-05-10 NOTE — Assessment & Plan Note (Signed)
Continue to encourage smoking cessation. 

## 2013-05-10 NOTE — Assessment & Plan Note (Signed)
Recent lipid numbers reviewed, LDL at goal.

## 2013-05-25 ENCOUNTER — Other Ambulatory Visit: Payer: Self-pay | Admitting: *Deleted

## 2013-05-25 MED ORDER — ESOMEPRAZOLE MAGNESIUM 40 MG PO CPDR: 40.0000 mg | DELAYED_RELEASE_CAPSULE | Freq: Every day | ORAL | Status: DC

## 2013-06-23 ENCOUNTER — Other Ambulatory Visit: Payer: Self-pay | Admitting: Cardiology

## 2013-06-23 ENCOUNTER — Other Ambulatory Visit: Payer: Self-pay | Admitting: Physician Assistant

## 2013-07-25 ENCOUNTER — Other Ambulatory Visit: Payer: Self-pay | Admitting: Cardiology

## 2013-07-25 ENCOUNTER — Other Ambulatory Visit: Payer: Self-pay | Admitting: Family Medicine

## 2013-08-26 ENCOUNTER — Other Ambulatory Visit: Payer: Self-pay | Admitting: Family Medicine

## 2013-09-08 ENCOUNTER — Telehealth: Payer: Self-pay | Admitting: Family Medicine

## 2013-09-08 MED ORDER — HYDROCODONE-ACETAMINOPHEN 10-325 MG PO TABS: 1.0000 | ORAL_TABLET | Freq: Four times a day (QID) | ORAL | Status: DC | PRN

## 2013-09-08 NOTE — Telephone Encounter (Signed)
Last seen 03/07/13

## 2013-09-08 NOTE — Telephone Encounter (Signed)
Patient needs Rx for norco 10-325

## 2013-09-08 NOTE — Telephone Encounter (Signed)
Rx printed and left up front for patient pick up. Patient notified she would need office visit for further refills.

## 2013-09-08 NOTE — Telephone Encounter (Signed)
May have #14, but needs OV, hydrocodone now schedule 2

## 2013-09-16 ENCOUNTER — Ambulatory Visit (INDEPENDENT_AMBULATORY_CARE_PROVIDER_SITE_OTHER): Payer: Medicare Other | Admitting: Family Medicine

## 2013-09-16 ENCOUNTER — Encounter: Payer: Self-pay | Admitting: Family Medicine

## 2013-09-16 VITALS — BP 134/80 | Ht 66.0 in | Wt 221.0 lb

## 2013-09-16 DIAGNOSIS — J019 Acute sinusitis, unspecified: Secondary | ICD-10-CM

## 2013-09-16 MED ORDER — ALBUTEROL SULFATE HFA 108 (90 BASE) MCG/ACT IN AERS: 2.0000 | INHALATION_SPRAY | Freq: Four times a day (QID) | RESPIRATORY_TRACT | Status: DC | PRN

## 2013-09-16 MED ORDER — AZITHROMYCIN 250 MG PO TABS: ORAL_TABLET | ORAL | Status: DC

## 2013-09-16 MED ORDER — PREDNISONE 20 MG PO TABS: ORAL_TABLET | ORAL | Status: AC

## 2013-09-16 MED ORDER — FLUCONAZOLE 150 MG PO TABS: 150.0000 mg | ORAL_TABLET | Freq: Once | ORAL | Status: DC

## 2013-09-16 NOTE — Progress Notes (Signed)
   Subjective:    Patient ID: Marcial Pacas, female    DOB: 03-20-1956, 58 y.o.   MRN: [id number]  Cough This is a new problem. The current episode started in the past 7 days. The cough is productive of sputum. Associated symptoms include chest pain, headaches, nasal congestion and rhinorrhea. Pertinent negatives include no ear pain, fever, shortness of breath or wheezing. Nothing aggravates the symptoms. She has tried OTC cough suppressant for the symptoms. The treatment provided mild relief.   Weds started. Thur- increased cough and wheeze, tried inhaler No seways, no fever    Review of Systems  Constitutional: Negative for fever and activity change.  HENT: Positive for congestion and rhinorrhea. Negative for ear pain.   Eyes: Negative for discharge.  Respiratory: Positive for cough. Negative for shortness of breath and wheezing.   Cardiovascular: Positive for chest pain.  Neurological: Positive for headaches.       Objective:   Physical Exam  Nursing note and vitals reviewed. Constitutional: She appears well-developed.  HENT:  Head: Normocephalic.  Nose: Nose normal.  Mouth/Throat: Oropharynx is clear and moist. No oropharyngeal exudate.  Neck: Neck supple.  Cardiovascular: Normal rate and normal heart sounds.   No murmur heard. Pulmonary/Chest: Effort normal and breath sounds normal. She has no wheezes.  Lymphadenopathy:    She has no cervical adenopathy.  Skin: Skin is warm and dry.          Assessment & Plan:  Viral syndrome with secondary sinusitis antibiotics prescribed warning signs discussed

## 2013-09-23 ENCOUNTER — Telehealth: Payer: Self-pay | Admitting: Family Medicine

## 2013-09-23 ENCOUNTER — Other Ambulatory Visit: Payer: Self-pay | Admitting: Family Medicine

## 2013-09-23 MED ORDER — SULFAMETHOXAZOLE-TMP DS 800-160 MG PO TABS: 1.0000 | ORAL_TABLET | Freq: Two times a day (BID) | ORAL | Status: DC

## 2013-09-23 NOTE — Telephone Encounter (Signed)
Patient is still having chest congestion, sweating, coughing, not getting anything up.  Just in on 09/16/13 and says she is not getting any better. Coats

## 2013-09-23 NOTE — Telephone Encounter (Signed)
Nurses to call and talk with patient. If significant shortness of breath or high fevers she ought to be rechecked. Otherwise I went ahead and sent in a prescription for Bactrim DS to take one twice daily for the next 10 days this was sent to [facility]

## 2013-09-23 NOTE — Telephone Encounter (Signed)
Dx with sinus infection and given zpack and prednisone

## 2013-09-23 NOTE — Telephone Encounter (Signed)
Patient stated she is not experiencing significant shortness of breath or high fevers and would like med sent to [facility]-Will go to ER if worse over the weekend.

## 2013-09-29 ENCOUNTER — Other Ambulatory Visit: Payer: Self-pay | Admitting: Family Medicine

## 2013-10-26 ENCOUNTER — Ambulatory Visit (INDEPENDENT_AMBULATORY_CARE_PROVIDER_SITE_OTHER): Payer: Medicare Other | Admitting: Nurse Practitioner

## 2013-10-26 VITALS — BP 128/80 | Ht 66.0 in | Wt 227.0 lb

## 2013-10-26 DIAGNOSIS — R05 Cough: Secondary | ICD-10-CM

## 2013-10-26 DIAGNOSIS — J45909 Unspecified asthma, uncomplicated: Secondary | ICD-10-CM | POA: Diagnosis not present

## 2013-10-26 DIAGNOSIS — R059 Cough, unspecified: Secondary | ICD-10-CM | POA: Diagnosis not present

## 2013-10-26 DIAGNOSIS — J309 Allergic rhinitis, unspecified: Secondary | ICD-10-CM

## 2013-10-26 DIAGNOSIS — J300 Vasomotor rhinitis: Secondary | ICD-10-CM

## 2013-10-26 NOTE — Patient Instructions (Signed)
OTC antihistamine Nasacort AQ as directed

## 2013-10-30 ENCOUNTER — Encounter: Payer: Self-pay | Admitting: Nurse Practitioner

## 2013-10-30 NOTE — Progress Notes (Signed)
Subjective:  Presents for recheck on her cough. Was seen on 2/13 given Z-Pak and prednisone with minimal relief. Also second course of antibiotics Bactrim DS which she has completed. No fever. Mild frontal area headache that, goes. No nausea vomiting. Reflux stable on Nexium. No abdominal pain. Occasional cough producing clear to white sputum. Continues to wheeze at least once a day, still using her inhaler. Worse at nighttime. No sore throat or ear pain. Former smoker.  Objective:   BP 128/80  Ht 5\' 6"  (1.676 m)  Wt 227 lb (102.967 kg)  BMI 36.66 kg/m2 NAD. Alert, oriented. TMs clear effusion, no erythema. Pharynx mildly injected, clear PND noted. Neck supple with mild soft anterior adenopathy. Lungs breath sounds distant but clear. No wheezing or tachypnea. Heart regular rate rhythm.  Assessment:Vasomotor rhinitis  Reactive airway disease - Plan: DG Chest 2 View  Cough - Plan: DG Chest 2 View  Plan: OTC antihistamines as directed. Nasacort AQ as directed. Hold on further medications today. Obtain chest x-ray. Consider inhaled steroid if symptoms persist.

## 2013-10-31 ENCOUNTER — Other Ambulatory Visit: Payer: Self-pay | Admitting: Family Medicine

## 2013-11-09 ENCOUNTER — Ambulatory Visit (INDEPENDENT_AMBULATORY_CARE_PROVIDER_SITE_OTHER): Payer: Medicare Other | Admitting: Nurse Practitioner

## 2013-11-09 ENCOUNTER — Encounter: Payer: Self-pay | Admitting: Nurse Practitioner

## 2013-11-09 VITALS — BP 102/68 | Ht 66.0 in | Wt 223.0 lb

## 2013-11-09 DIAGNOSIS — Z79899 Other long term (current) drug therapy: Secondary | ICD-10-CM

## 2013-11-09 DIAGNOSIS — K219 Gastro-esophageal reflux disease without esophagitis: Secondary | ICD-10-CM | POA: Diagnosis not present

## 2013-11-09 DIAGNOSIS — E039 Hypothyroidism, unspecified: Secondary | ICD-10-CM | POA: Diagnosis not present

## 2013-11-09 DIAGNOSIS — IMO0001 Reserved for inherently not codable concepts without codable children: Secondary | ICD-10-CM | POA: Diagnosis not present

## 2013-11-09 DIAGNOSIS — E782 Mixed hyperlipidemia: Secondary | ICD-10-CM

## 2013-11-09 DIAGNOSIS — I10 Essential (primary) hypertension: Secondary | ICD-10-CM | POA: Diagnosis not present

## 2013-11-09 DIAGNOSIS — R739 Hyperglycemia, unspecified: Secondary | ICD-10-CM

## 2013-11-09 DIAGNOSIS — F341 Dysthymic disorder: Secondary | ICD-10-CM

## 2013-11-09 DIAGNOSIS — F329 Major depressive disorder, single episode, unspecified: Secondary | ICD-10-CM

## 2013-11-09 DIAGNOSIS — R7309 Other abnormal glucose: Secondary | ICD-10-CM

## 2013-11-09 DIAGNOSIS — M797 Fibromyalgia: Secondary | ICD-10-CM

## 2013-11-09 DIAGNOSIS — F32A Depression, unspecified: Secondary | ICD-10-CM

## 2013-11-09 DIAGNOSIS — F419 Anxiety disorder, unspecified: Secondary | ICD-10-CM

## 2013-11-09 MED ORDER — LEVOTHYROXINE SODIUM 125 MCG PO TABS: ORAL_TABLET | ORAL | Status: DC

## 2013-11-09 MED ORDER — FLUOXETINE HCL 40 MG PO CAPS: ORAL_CAPSULE | ORAL | Status: DC

## 2013-11-09 MED ORDER — HYDROCODONE-ACETAMINOPHEN 10-325 MG PO TABS: 1.0000 | ORAL_TABLET | Freq: Four times a day (QID) | ORAL | Status: DC | PRN

## 2013-11-09 MED ORDER — PANTOPRAZOLE SODIUM 40 MG PO TBEC: 40.0000 mg | DELAYED_RELEASE_TABLET | Freq: Every day | ORAL | Status: DC

## 2013-11-09 MED ORDER — CYCLOBENZAPRINE HCL 10 MG PO TABS: 10.0000 mg | ORAL_TABLET | Freq: Three times a day (TID) | ORAL | Status: AC | PRN

## 2013-11-09 MED ORDER — ATORVASTATIN CALCIUM 80 MG PO TABS: ORAL_TABLET | ORAL | Status: DC

## 2013-11-09 MED ORDER — ISOSORBIDE MONONITRATE ER 30 MG PO TB24: ORAL_TABLET | ORAL | Status: DC

## 2013-11-09 MED ORDER — ALPRAZOLAM 0.25 MG PO TABS: 0.2500 mg | ORAL_TABLET | Freq: Three times a day (TID) | ORAL | Status: DC | PRN

## 2013-11-10 ENCOUNTER — Encounter: Payer: Self-pay | Admitting: Nurse Practitioner

## 2013-11-10 DIAGNOSIS — F329 Major depressive disorder, single episode, unspecified: Secondary | ICD-10-CM | POA: Insufficient documentation

## 2013-11-10 DIAGNOSIS — F32A Depression, unspecified: Secondary | ICD-10-CM | POA: Insufficient documentation

## 2013-11-10 DIAGNOSIS — F419 Anxiety disorder, unspecified: Secondary | ICD-10-CM

## 2013-11-10 DIAGNOSIS — K219 Gastro-esophageal reflux disease without esophagitis: Secondary | ICD-10-CM | POA: Insufficient documentation

## 2013-11-10 NOTE — Progress Notes (Signed)
Subjective:  Presents for routine followup. Cough and congestion much improved, defers chest x-ray at this time. Patient had slightly elevated fasting blood sugar on her last lab work, has only been at all sweet tea., was drinking large amounts. Has 1 cup of coffee in the morning with sugar otherwise no other sugar intake. Drinking lots of water. No chest pain/ischemic type pain. No unusual shortness of breath. Gets regular followup with her cardiologist. Reflux stable on Nexium requesting a less expensive alternative. No abdominal pain. Bowels normal limit. Anxiety and depression stable. Takes one pain pill a day at bedtime for her fibromyalgia, will occasionally take a half to one tab during the day if pain is severe. Has "good days and bad days". Trying to stay active. Quit smoking in January.  Objective:   BP 102/68  Ht 5\' 6"  (1.676 m)  Wt 223 lb (101.152 kg)  BMI 36.01 kg/m2 NAD. Alert, oriented. Cheerful affect. TMs mild clear effusion, no erythema. Pharynx minimally injected with clear PND noted. Neck supple with mild soft anterior adenopathy. Thyroid normal limit to palpation, no quarters or masses noted, nontender. Lungs clear. Heart regular rate rhythm. Abdomen soft nondistended nontender. No obvious masses.  Assessment: Problem List Items Addressed This Visit     Cardiovascular and Mediastinum   Essential hypertension, benign - Primary   Relevant Medications      atorvastatin (LIPITOR) tablet      isosorbide mononitrate (IMDUR) 24 hr tablet   Other Relevant Orders      Basic metabolic panel     Digestive   GERD (gastroesophageal reflux disease)   Relevant Medications      pantoprazole (PROTONIX) EC tablet     Endocrine   UNSPECIFIED HYPOTHYROIDISM   Relevant Medications      levothyroxine (SYNTHROID, LEVOTHROID) tablet   Other Relevant Orders      TSH     Musculoskeletal and Integument   Fibromyalgia (Chronic)     Other   Mixed hyperlipidemia   Relevant Medications       atorvastatin (LIPITOR) tablet      isosorbide mononitrate (IMDUR) 24 hr tablet   Other Relevant Orders      Lipid panel   Anxiety and depression    Other Visit Diagnoses   High risk medication use        Relevant Orders       Hepatic function panel    Hyperglycemia        Relevant Orders       Hemoglobin A1c      Plan: DC Nexium. Switch to Protonix. Meds ordered this encounter  Medications  . DISCONTD: HYDROcodone-acetaminophen (NORCO) 10-325 MG per tablet    Sig: Take 1 tablet by mouth every 6 (six) hours as needed.    Dispense:  40 tablet    Refill:  0    Order Specific Question:  Supervising Provider    Answer:  Mikey Kirschner [[id number]]  . atorvastatin (LIPITOR) 80 MG tablet    Sig: TAKE (1) TABLET BY MOUTH ONCE DAILY.    Dispense:  30 tablet    Refill:  5    Order Specific Question:  Supervising Provider    Answer:  Mikey Kirschner [[id number]]  . ALPRAZolam (XANAX) 0.25 MG tablet    Sig: Take 1 tablet (0.25 mg total) by mouth 3 (three) times daily as needed for anxiety.    Dispense:  30 tablet    Refill:  2    Order Specific Question:  Supervising Provider    Answer:  Mikey Kirschner [[id number]]  . cyclobenzaprine (FLEXERIL) 10 MG tablet    Sig: Take 1 tablet (10 mg total) by mouth every 8 (eight) hours as needed for muscle spasms.    Dispense:  30 tablet    Refill:  1    Order Specific Question:  Supervising Provider    Answer:  Mikey Kirschner [[id number]]  . FLUoxetine (PROZAC) 40 MG capsule    Sig: TAKE 1 CAPSULE BY MOUTH ONCE A DAY.    Dispense:  30 capsule    Refill:  5    Order Specific Question:  Supervising Provider    Answer:  Mikey Kirschner [[id number]]  . levothyroxine (SYNTHROID, LEVOTHROID) 125 MCG tablet    Sig: TAKE 1 TABLET ONCE DAILY.    Dispense:  30 tablet    Refill:  5    Order Specific Question:  Supervising Provider    Answer:  Mikey Kirschner [[id number]]  . isosorbide mononitrate (IMDUR) 30 MG 24 hr tablet    Sig: TAKE (1/2) TABLET BY MOUTH DAILY.     Dispense:  15 tablet    Refill:  5    Order Specific Question:  Supervising Provider    Answer:  Mikey Kirschner [[id number]]  . DISCONTD: HYDROcodone-acetaminophen (NORCO) 10-325 MG per tablet    Sig: Take 1 tablet by mouth every 6 (six) hours as needed.    Dispense:  40 tablet    Refill:  0    May fill 30 days from 11/09/13    Order Specific Question:  Supervising Provider    Answer:  Mikey Kirschner [[id number]]  . HYDROcodone-acetaminophen (NORCO) 10-325 MG per tablet    Sig: Take 1 tablet by mouth every 6 (six) hours as needed.    Dispense:  40 tablet    Refill:  0    May fill 60 days from 11/09/13    Order Specific Question:  Supervising Provider    Answer:  Mikey Kirschner [[id number]]  . pantoprazole (PROTONIX) 40 MG tablet    Sig: Take 1 tablet (40 mg total) by mouth daily. For acid reflux    Dispense:  30 tablet    Refill:  5    Order Specific Question:  Supervising Provider    Answer:  Maggie Font   Advised patient not to take her Xanax and hydrocodone at the same time, drowsiness precautions. Reminded patient about preventive health physical. Otherwise recheck in 3 months, sooner if any problems.

## 2013-11-16 DIAGNOSIS — Z79899 Other long term (current) drug therapy: Secondary | ICD-10-CM | POA: Diagnosis not present

## 2013-11-16 DIAGNOSIS — R7309 Other abnormal glucose: Secondary | ICD-10-CM | POA: Diagnosis not present

## 2013-11-16 DIAGNOSIS — I10 Essential (primary) hypertension: Secondary | ICD-10-CM | POA: Diagnosis not present

## 2013-11-16 DIAGNOSIS — E039 Hypothyroidism, unspecified: Secondary | ICD-10-CM | POA: Diagnosis not present

## 2013-11-16 DIAGNOSIS — E782 Mixed hyperlipidemia: Secondary | ICD-10-CM | POA: Diagnosis not present

## 2013-11-16 LAB — HEPATIC FUNCTION PANEL
ALT: 17 U/L (ref 0–35)
AST: 19 U/L (ref 0–37)
Albumin: 3.6 g/dL (ref 3.5–5.2)
Alkaline Phosphatase: 104 U/L (ref 39–117)
Bilirubin, Direct: 0.1 mg/dL (ref 0.0–0.3)
Indirect Bilirubin: 0.4 mg/dL (ref 0.2–1.2)
Total Bilirubin: 0.5 mg/dL (ref 0.2–1.2)
Total Protein: 7.1 g/dL (ref 6.0–8.3)

## 2013-11-16 LAB — LIPID PANEL
Cholesterol: 142 mg/dL (ref 0–200)
HDL: 40 mg/dL (ref >39)
LDL Cholesterol: 78 mg/dL (ref 0–99)
Total CHOL/HDL Ratio: 3.6 Ratio
Triglycerides: 120 mg/dL (ref <150)
VLDL: 24 mg/dL (ref 0–40)

## 2013-11-16 LAB — BASIC METABOLIC PANEL
BUN: 16 mg/dL (ref 6–23)
CO2: 29 mEq/L (ref 19–32)
Calcium: 9.7 mg/dL (ref 8.4–10.5)
Chloride: 103 mEq/L (ref 96–112)
Creat: 1.01 mg/dL (ref 0.50–1.10)
Glucose, Bld: 105 mg/dL — ABNORMAL HIGH (ref 70–99)
Potassium: 4.4 mEq/L (ref 3.5–5.3)
Sodium: 138 mEq/L (ref 135–145)

## 2013-11-16 LAB — HEMOGLOBIN A1C
Hgb A1c MFr Bld: 5.8 % — ABNORMAL HIGH (ref <5.7)
Mean Plasma Glucose: 120 mg/dL — ABNORMAL HIGH (ref <117)

## 2013-11-16 LAB — TSH: TSH: 4.137 u[IU]/mL (ref 0.350–4.500)

## 2013-11-18 ENCOUNTER — Encounter: Payer: Self-pay | Admitting: Nurse Practitioner

## 2013-11-18 DIAGNOSIS — R7301 Impaired fasting glucose: Secondary | ICD-10-CM | POA: Insufficient documentation

## 2013-11-19 DIAGNOSIS — I10 Essential (primary) hypertension: Secondary | ICD-10-CM | POA: Diagnosis not present

## 2013-11-19 DIAGNOSIS — E079 Disorder of thyroid, unspecified: Secondary | ICD-10-CM | POA: Diagnosis not present

## 2013-11-19 DIAGNOSIS — E785 Hyperlipidemia, unspecified: Secondary | ICD-10-CM | POA: Diagnosis present

## 2013-11-19 DIAGNOSIS — F3289 Other specified depressive episodes: Secondary | ICD-10-CM | POA: Diagnosis not present

## 2013-11-19 DIAGNOSIS — IMO0001 Reserved for inherently not codable concepts without codable children: Secondary | ICD-10-CM | POA: Diagnosis not present

## 2013-11-19 DIAGNOSIS — K219 Gastro-esophageal reflux disease without esophagitis: Secondary | ICD-10-CM | POA: Diagnosis present

## 2013-11-19 DIAGNOSIS — F17200 Nicotine dependence, unspecified, uncomplicated: Secondary | ICD-10-CM | POA: Diagnosis present

## 2013-11-19 DIAGNOSIS — Z8249 Family history of ischemic heart disease and other diseases of the circulatory system: Secondary | ICD-10-CM | POA: Diagnosis not present

## 2013-11-19 DIAGNOSIS — M549 Dorsalgia, unspecified: Secondary | ICD-10-CM | POA: Diagnosis present

## 2013-11-19 DIAGNOSIS — R109 Unspecified abdominal pain: Secondary | ICD-10-CM | POA: Diagnosis not present

## 2013-11-19 DIAGNOSIS — Z9851 Tubal ligation status: Secondary | ICD-10-CM | POA: Diagnosis not present

## 2013-11-19 DIAGNOSIS — R1011 Right upper quadrant pain: Secondary | ICD-10-CM | POA: Diagnosis not present

## 2013-11-19 DIAGNOSIS — K859 Acute pancreatitis without necrosis or infection, unspecified: Secondary | ICD-10-CM | POA: Diagnosis not present

## 2013-11-19 DIAGNOSIS — I2510 Atherosclerotic heart disease of native coronary artery without angina pectoris: Secondary | ICD-10-CM | POA: Diagnosis not present

## 2013-11-19 DIAGNOSIS — F329 Major depressive disorder, single episode, unspecified: Secondary | ICD-10-CM | POA: Diagnosis present

## 2013-11-19 DIAGNOSIS — D72829 Elevated white blood cell count, unspecified: Secondary | ICD-10-CM | POA: Diagnosis present

## 2013-11-19 DIAGNOSIS — E039 Hypothyroidism, unspecified: Secondary | ICD-10-CM | POA: Diagnosis present

## 2013-11-19 DIAGNOSIS — Z9861 Coronary angioplasty status: Secondary | ICD-10-CM | POA: Diagnosis not present

## 2013-11-28 ENCOUNTER — Encounter: Payer: Self-pay | Admitting: Cardiology

## 2013-11-28 ENCOUNTER — Ambulatory Visit (INDEPENDENT_AMBULATORY_CARE_PROVIDER_SITE_OTHER): Payer: Medicare Other | Admitting: Cardiology

## 2013-11-28 ENCOUNTER — Ambulatory Visit (INDEPENDENT_AMBULATORY_CARE_PROVIDER_SITE_OTHER): Payer: Medicare Other | Admitting: Nurse Practitioner

## 2013-11-28 ENCOUNTER — Encounter: Payer: Self-pay | Admitting: Nurse Practitioner

## 2013-11-28 VITALS — BP 114/82 | HR 54 | Ht 66.0 in | Wt 220.8 lb

## 2013-11-28 VITALS — BP 112/82 | Ht 66.0 in | Wt 223.0 lb

## 2013-11-28 DIAGNOSIS — I2510 Atherosclerotic heart disease of native coronary artery without angina pectoris: Secondary | ICD-10-CM | POA: Diagnosis not present

## 2013-11-28 DIAGNOSIS — K7689 Other specified diseases of liver: Secondary | ICD-10-CM

## 2013-11-28 DIAGNOSIS — K859 Acute pancreatitis without necrosis or infection, unspecified: Secondary | ICD-10-CM

## 2013-11-28 DIAGNOSIS — E782 Mixed hyperlipidemia: Secondary | ICD-10-CM

## 2013-11-28 DIAGNOSIS — I10 Essential (primary) hypertension: Secondary | ICD-10-CM

## 2013-11-28 DIAGNOSIS — K760 Fatty (change of) liver, not elsewhere classified: Secondary | ICD-10-CM

## 2013-11-28 NOTE — Patient Instructions (Signed)

## 2013-11-28 NOTE — Assessment & Plan Note (Signed)
Recent lab work showed normal LFTs and LDL 78 on Lipitor.

## 2013-11-28 NOTE — Assessment & Plan Note (Signed)
Blood pressure is normal today. 

## 2013-11-28 NOTE — Progress Notes (Signed)
Clinical Summary Ms. Lose is a 58 y.o.female last seen in October 2014. She has been doing well from a cardiac perspective. She does tell me that she had a recent hospitalization while visiting a friend in [place] related to mild pancreatitis. She was not aware of any diagnosis of gallstones or specific relation to any of her medications. She continues on the same cardiac regimen.  Recent lab work reviewed finding potassium 4.4, BUN 16, creatinine 1.0, normal LFTs, hemoglobin A1c 5.8, cholesterol 142, triglycerides 120, HDL 40, LDL 78, TSH 4.1.  Cardiac catheterization in May 2013 revealed patent LAD and diagonal stents with otherwise mild nonobstructive atherosclerosis, LVEF 50-55%.   No Known Allergies  Current Outpatient Prescriptions  Medication Sig Dispense Refill  . albuterol (PROVENTIL HFA;VENTOLIN HFA) 108 (90 BASE) MCG/ACT inhaler Inhale 2 puffs into the lungs every 6 (six) hours as needed for wheezing.  1 Inhaler  2  . ALPRAZolam (XANAX) 0.25 MG tablet Take 1 tablet (0.25 mg total) by mouth 3 (three) times daily as needed for anxiety.  30 tablet  2  . atorvastatin (LIPITOR) 80 MG tablet TAKE (1) TABLET BY MOUTH ONCE DAILY.  30 tablet  5  . clopidogrel (PLAVIX) 75 MG tablet TAKE (1) TABLET BY MOUTH ONCE DAILY.  30 tablet  6  . cyclobenzaprine (FLEXERIL) 10 MG tablet Take 1 tablet (10 mg total) by mouth every 8 (eight) hours as needed for muscle spasms.  30 tablet  1  . FLUoxetine (PROZAC) 40 MG capsule TAKE 1 CAPSULE BY MOUTH ONCE A DAY.  30 capsule  5  . HYDROcodone-acetaminophen (NORCO) 10-325 MG per tablet Take 1 tablet by mouth every 6 (six) hours as needed.  40 tablet  0  . isosorbide mononitrate (IMDUR) 30 MG 24 hr tablet TAKE (1/2) TABLET BY MOUTH DAILY.  15 tablet  5  . levothyroxine (SYNTHROID, LEVOTHROID) 125 MCG tablet TAKE 1 TABLET ONCE DAILY.  30 tablet  5  . metoprolol (LOPRESSOR) 50 MG tablet TAKE (1) TABLET TWICE DAILY.  60 tablet  6  . nitroGLYCERIN  (NITROSTAT) 0.4 MG SL tablet Place 0.4 mg under the tongue every 5 (five) minutes x 3 doses as needed for chest pain.      . pantoprazole (PROTONIX) 40 MG tablet Take 1 tablet (40 mg total) by mouth daily. For acid reflux  30 tablet  5   No current facility-administered medications for this visit.    Past Medical History  Diagnosis Date  . Anxiety   . Depression     Prior suicide attempt  . GERD (gastroesophageal reflux disease)   . Essential hypertension, benign   . Hypothyroidism   . Asthma   . Coronary atherosclerosis of native coronary artery     Nonobstructive circ/RCA, DES LAD/diagonal 2005, LVEF 60%  . Palpitations     PVC's have been noted  . NSTEMI (non-ST elevated myocardial infarction) 2005  . Fibromyalgia     Social History Ms. Vandervoort reports that she has been smoking Cigarettes.  She has a 12 pack-year smoking history. She has never used smokeless tobacco. Ms. Kuennen reports that she does not drink alcohol.  Review of Systems No current abdominal pain, nausea, or emesis. Good appetite. Try to lose weight - has lost about 7 pounds. Exercising. Otherwise negative.  Physical Examination Filed Vitals:   11/28/13 1034  BP: 114/82  Pulse: 54   Filed Weights   11/28/13 1034  Weight: 220 lb 12.8 oz (100.154 kg)  Overweight woman, appears comfortable.  HEENT: Conjunctiva and lids normal, oropharynx clear.  Neck: Supple, no elevated JVP or carotid bruits, no thyromegaly.  Lungs: Clear to auscultation, nonlabored breathing at rest.  Cardiac: Regular rate and rhythm, no S3 or significant systolic murmur, no pericardial rub.  Abdomen: Soft, nontender, bowel sounds present.  Extremities: No pitting edema, distal pulses 2+.  Skin: Warm and dry. Tattoos extending up both arms.  Musculoskeletal: No kyphosis.  Neuropsychiatric: Alert and oriented x3, affect grossly appropriate.   Problem List and Plan   CORONARY ATHEROSCLEROSIS NATIVE CORONARY  ARTERY Symptomatically stable on medical therapy. Cardiac catheterization within the last 2 years noted above. Continue observation.  Essential hypertension, benign Blood pressure is normal today.  Mixed hyperlipidemia Recent lab work showed normal LFTs and LDL 78 on Lipitor.    Satira Sark, M.D., F.A.C.C.

## 2013-11-28 NOTE — Assessment & Plan Note (Signed)
Symptomatically stable on medical therapy. Cardiac catheterization within the last 2 years noted above. Continue observation.

## 2013-11-30 ENCOUNTER — Encounter: Payer: Self-pay | Admitting: Nurse Practitioner

## 2013-11-30 DIAGNOSIS — K859 Acute pancreatitis without necrosis or infection, unspecified: Secondary | ICD-10-CM | POA: Insufficient documentation

## 2013-11-30 NOTE — Progress Notes (Signed)
Subjective:  Presents for followup after a hospitalization while she was out of town in [place]. Was admitted for acute pancreatitis. Was in the hospital for several days. No further pain. Taking fluids well. Voiding normal limit. No fever. No nausea vomiting. States she feels back to normal.  Objective:   BP 112/82  Ht 5\' 6"  (1.676 m)  Wt 223 lb (101.152 kg)  BMI 36.01 kg/m2 NAD. Alert, oriented. Lungs clear. Heart regular rate rhythm. See CT scan and ultrasound reports. Report shows evidence of mild fatty liver disease. Last liver profile was normal.  Assessment:Fatty liver disease, nonalcoholic  Pancreatitis  Plan: Discussed importance of weight loss, healthy diet particularly low fat low cholesterol. Recheck if any further problems. Otherwise routine followup.

## 2014-01-09 ENCOUNTER — Other Ambulatory Visit: Payer: Self-pay | Admitting: *Deleted

## 2014-01-09 MED ORDER — ISOSORBIDE MONONITRATE ER 30 MG PO TB24: ORAL_TABLET | ORAL | Status: DC

## 2014-03-13 ENCOUNTER — Telehealth: Payer: Self-pay | Admitting: Family Medicine

## 2014-03-13 ENCOUNTER — Other Ambulatory Visit: Payer: Self-pay | Admitting: *Deleted

## 2014-03-13 ENCOUNTER — Other Ambulatory Visit: Payer: Self-pay | Admitting: Cardiology

## 2014-03-13 MED ORDER — HYDROCODONE-ACETAMINOPHEN 10-325 MG PO TABS
1.0000 | ORAL_TABLET | Freq: Four times a day (QID) | ORAL | Status: DC | PRN
Start: 2014-03-13 — End: 2014-04-28

## 2014-03-13 NOTE — Telephone Encounter (Signed)
May have prescription for 30 tablets will need office visit before further refills

## 2014-03-13 NOTE — Telephone Encounter (Signed)
Script ready for pickup and office visit needed. Patient verbalized understanding.

## 2014-03-13 NOTE — Telephone Encounter (Signed)
Patient needs Rx for hydrocodone.

## 2014-03-13 NOTE — Telephone Encounter (Signed)
Last seen 11/28/13

## 2014-04-28 ENCOUNTER — Encounter: Payer: Self-pay | Admitting: Nurse Practitioner

## 2014-04-28 ENCOUNTER — Ambulatory Visit (INDEPENDENT_AMBULATORY_CARE_PROVIDER_SITE_OTHER): Payer: Medicare Other | Admitting: Nurse Practitioner

## 2014-04-28 VITALS — BP 120/84 | Ht 66.0 in | Wt 221.0 lb

## 2014-04-28 DIAGNOSIS — K589 Irritable bowel syndrome without diarrhea: Secondary | ICD-10-CM

## 2014-04-28 DIAGNOSIS — I2510 Atherosclerotic heart disease of native coronary artery without angina pectoris: Secondary | ICD-10-CM

## 2014-04-28 DIAGNOSIS — Z5189 Encounter for other specified aftercare: Secondary | ICD-10-CM

## 2014-04-28 DIAGNOSIS — Z23 Encounter for immunization: Secondary | ICD-10-CM | POA: Diagnosis not present

## 2014-04-28 DIAGNOSIS — R52 Pain, unspecified: Secondary | ICD-10-CM

## 2014-04-28 DIAGNOSIS — K644 Residual hemorrhoidal skin tags: Secondary | ICD-10-CM

## 2014-04-28 MED ORDER — HYDROCODONE-ACETAMINOPHEN 10-325 MG PO TABS: 1.0000 | ORAL_TABLET | Freq: Four times a day (QID) | ORAL | Status: DC | PRN

## 2014-04-28 MED ORDER — ALPRAZOLAM 0.25 MG PO TABS: 0.2500 mg | ORAL_TABLET | Freq: Three times a day (TID) | ORAL | Status: DC | PRN

## 2014-04-28 MED ORDER — HYDROCORTISONE ACETATE 25 MG RE SUPP: 25.0000 mg | Freq: Two times a day (BID) | RECTAL | Status: DC

## 2014-04-30 ENCOUNTER — Encounter: Payer: Self-pay | Admitting: Nurse Practitioner

## 2014-04-30 DIAGNOSIS — K589 Irritable bowel syndrome without diarrhea: Secondary | ICD-10-CM | POA: Insufficient documentation

## 2014-04-30 DIAGNOSIS — K644 Residual hemorrhoidal skin tags: Secondary | ICD-10-CM | POA: Insufficient documentation

## 2014-04-30 NOTE — Progress Notes (Signed)
Subjective:  Presents for complaints of a flareup of her hemorrhoids for the past month. Has a chronic history. Minimal bleeding. Warning itching and pain in the rectal area. Relieved last time with steroid suppository. Her last colonoscopy was 05/31/2004. Having alternating cycles of diarrhea and constipation. No fever. Unassociated with any particular foods. Taking hydrocodone 1-2 times per day for fibromyalgia and arthritis pain. Limit this to 40 per month. Also takes low-dose Xanax and Prozac 40 mg daily for her anxiety.  Objective:   BP 120/84  Ht 5\' 6"  (1.676 m)  Wt 221 lb (100.245 kg)  BMI 35.69 kg/m2 NAD. Alert, oriented. Lungs clear. Heart regular rate rhythm. Abdomen soft nondistended nontender. Rectal area to small pink external hemorrhoids noted, tender to palpation, no thrombosis.  Assessment:  Problem List Items Addressed This Visit     Cardiovascular and Mediastinum   External hemorrhoid - Primary     Digestive   Irritable bowel syndrome    Other Visit Diagnoses   Need for prophylactic vaccination and inoculation against influenza        Pain management           Plan:  Meds ordered this encounter  Medications  . ALPRAZolam (XANAX) 0.25 MG tablet    Sig: Take 1 tablet (0.25 mg total) by mouth 3 (three) times daily as needed for anxiety.    Dispense:  30 tablet    Refill:  2    Order Specific Question:  Supervising Provider    Answer:  Mikey Kirschner [[id number]]  . DISCONTD: HYDROcodone-acetaminophen (NORCO) 10-325 MG per tablet    Sig: Take 1 tablet by mouth every 6 (six) hours as needed.    Dispense:  40 tablet    Refill:  0    Order Specific Question:  Supervising Provider    Answer:  Mikey Kirschner [[id number]]  . DISCONTD: HYDROcodone-acetaminophen (NORCO) 10-325 MG per tablet    Sig: Take 1 tablet by mouth every 6 (six) hours as needed.    Dispense:  40 tablet    Refill:  0    May fill 30 days from 04/28/14    Order Specific Question:  Supervising Provider    Answer:  Mikey Kirschner [[id number]]  . HYDROcodone-acetaminophen (NORCO) 10-325 MG per tablet    Sig: Take 1 tablet by mouth every 6 (six) hours as needed.    Dispense:  40 tablet    Refill:  0    May fill 60 days from 04/28/14    Order Specific Question:  Supervising Provider    Answer:  Mikey Kirschner [[id number]]  . hydrocortisone (ANUSOL-HC) 25 MG suppository    Sig: Place 1 suppository (25 mg total) rectally 2 (two) times daily.    Dispense:  12 suppository    Refill:  0    Order Specific Question:  Supervising Provider    Answer:  Mikey Kirschner [[id number]]   Continue warm sitz baths and Tucks pads. Call back if no improvement in hemorrhoids. Given information on local providers for colonoscopy which is due. Return in about 3 months (around 07/28/2014).

## 2014-05-17 ENCOUNTER — Ambulatory Visit (INDEPENDENT_AMBULATORY_CARE_PROVIDER_SITE_OTHER): Payer: Medicare Other | Admitting: Cardiology

## 2014-05-17 ENCOUNTER — Encounter: Payer: Self-pay | Admitting: Cardiology

## 2014-05-17 VITALS — BP 120/73 | HR 49 | Ht 66.0 in | Wt 220.8 lb

## 2014-05-17 DIAGNOSIS — E782 Mixed hyperlipidemia: Secondary | ICD-10-CM | POA: Diagnosis not present

## 2014-05-17 DIAGNOSIS — I2510 Atherosclerotic heart disease of native coronary artery without angina pectoris: Secondary | ICD-10-CM

## 2014-05-17 DIAGNOSIS — I10 Essential (primary) hypertension: Secondary | ICD-10-CM

## 2014-05-17 DIAGNOSIS — Z5181 Encounter for therapeutic drug level monitoring: Secondary | ICD-10-CM | POA: Diagnosis not present

## 2014-05-17 DIAGNOSIS — E038 Other specified hypothyroidism: Secondary | ICD-10-CM

## 2014-05-17 NOTE — Patient Instructions (Signed)
Your physician recommends that you schedule a follow-up appointment in: 6 months. You will receive a reminder letter in the mail in about 4 months reminding you to call and schedule your appointment. If you don't receive this letter, please contact our office. Your physician recommends that you continue on your current medications as directed. Please refer to the Current Medication list given to you today. Your physician recommends that you have FASTING lipid/liver profile and TSH checked.

## 2014-05-17 NOTE — Assessment & Plan Note (Signed)
Symptomatically stable on medical therapy, ECG reviewed. Continue current medical regimen, recommended exercise. Followup in 6 months.

## 2014-05-17 NOTE — Progress Notes (Signed)
Clinical Summary [name] is a 58 y.o.female last seen in April. She has been doing well, no angina or shortness of breath. ECG today shows sinus bradycardia, no significant ST segment changes.  Lab work from April showed potassium 4.4, BUN 16, creatinine 1.0, normal LFTs, hemoglobin A1c 5.8, cholesterol 142, triglycerides 120, HDL 40, LDL 78, TSH 4.1.  Cardiac catheterization in May 2013 revealed patent LAD and diagonal stents with otherwise mild nonobstructive atherosclerosis, LVEF 50-55%.  Still enjoys riding her motorcycle.  No Known Allergies  Current Outpatient Prescriptions  Medication Sig Dispense Refill  . albuterol (PROVENTIL HFA;VENTOLIN HFA) 108 (90 BASE) MCG/ACT inhaler Inhale 2 puffs into the lungs every 6 (six) hours as needed for wheezing.  1 Inhaler  2  . ALPRAZolam (XANAX) 0.25 MG tablet Take 1 tablet (0.25 mg total) by mouth 3 (three) times daily as needed for anxiety.  30 tablet  2  . atorvastatin (LIPITOR) 80 MG tablet TAKE (1) TABLET BY MOUTH ONCE DAILY.  30 tablet  5  . clopidogrel (PLAVIX) 75 MG tablet TAKE (1) TABLET BY MOUTH ONCE DAILY.  30 tablet  3  . cyclobenzaprine (FLEXERIL) 10 MG tablet Take 1 tablet (10 mg total) by mouth every 8 (eight) hours as needed for muscle spasms.  30 tablet  1  . FLUoxetine (PROZAC) 40 MG capsule TAKE 1 CAPSULE BY MOUTH ONCE A DAY.  30 capsule  5  . HYDROcodone-acetaminophen (NORCO) 10-325 MG per tablet Take 1 tablet by mouth every 6 (six) hours as needed.  40 tablet  0  . hydrocortisone (ANUSOL-HC) 25 MG suppository Place 1 suppository (25 mg total) rectally 2 (two) times daily.  12 suppository  0  . isosorbide mononitrate (IMDUR) 30 MG 24 hr tablet TAKE (1/2) TABLET BY MOUTH DAILY.  45 tablet  3  . levothyroxine (SYNTHROID, LEVOTHROID) 125 MCG tablet TAKE 1 TABLET ONCE DAILY.  30 tablet  5  . metoprolol (LOPRESSOR) 50 MG tablet TAKE (1) TABLET TWICE DAILY.  60 tablet  6  . nitroGLYCERIN (NITROSTAT) 0.4 MG SL tablet Place  0.4 mg under the tongue every 5 (five) minutes x 3 doses as needed for chest pain.      . pantoprazole (PROTONIX) 40 MG tablet Take 1 tablet (40 mg total) by mouth daily. For acid reflux  30 tablet  5   No current facility-administered medications for this visit.    Past Medical History  Diagnosis Date  . Anxiety   . Depression     Prior suicide attempt  . GERD (gastroesophageal reflux disease)   . Essential hypertension, benign   . Hypothyroidism   . Asthma   . Coronary atherosclerosis of native coronary artery     Nonobstructive circ/RCA, DES LAD/diagonal 2005, LVEF 60%  . Palpitations     PVC's have been noted  . NSTEMI (non-ST elevated myocardial infarction) 2005  . Fibromyalgia     Social History [name] reports that she has been smoking Cigarettes.  She has a 12 pack-year smoking history. She has never used smokeless tobacco. [name] reports that she does not drink alcohol.  Review of Systems No palpitations or syncope. No claudication. Good appetite. Otherwise systems reviewed and negative.  Physical Examination Filed Vitals:   05/17/14 1445  BP: 120/73  Pulse: 49   Filed Weights   05/17/14 1445  Weight: 220 lb 12 oz (100.132 kg)    Overweight woman, appears comfortable.  HEENT: Conjunctiva and lids normal, oropharynx clear.  Neck:  Supple, no elevated JVP or carotid bruits, no thyromegaly.  Lungs: Clear to auscultation, nonlabored breathing at rest.  Cardiac: Regular rate and rhythm, no S3 or significant systolic murmur, no pericardial rub.  Abdomen: Soft, nontender, bowel sounds present.  Extremities: No pitting edema, distal pulses 2+.  Skin: Warm and dry. Tattoos extending up both arms.  Musculoskeletal: No kyphosis.  Neuropsychiatric: Alert and oriented x3, affect grossly appropriate.   Problem List and Plan   CORONARY ATHEROSCLEROSIS NATIVE CORONARY ARTERY Symptomatically stable on medical therapy, ECG reviewed. Continue current medical  regimen, recommended exercise. Followup in 6 months.  Mixed hyperlipidemia Followup FLP and LFTs. She continues on Lipitor.  Essential hypertension, benign Blood pressure is well-controlled today.    Satira Sark, M.D., F.A.C.C.

## 2014-05-17 NOTE — Assessment & Plan Note (Signed)
Followup FLP and LFTs. She continues on Lipitor.

## 2014-05-17 NOTE — Assessment & Plan Note (Signed)
Blood pressure is well-controlled today. 

## 2014-05-19 DIAGNOSIS — M4802 Spinal stenosis, cervical region: Secondary | ICD-10-CM | POA: Diagnosis not present

## 2014-05-19 DIAGNOSIS — M542 Cervicalgia: Secondary | ICD-10-CM | POA: Diagnosis not present

## 2014-05-19 DIAGNOSIS — F17200 Nicotine dependence, unspecified, uncomplicated: Secondary | ICD-10-CM | POA: Diagnosis not present

## 2014-05-19 DIAGNOSIS — M5412 Radiculopathy, cervical region: Secondary | ICD-10-CM | POA: Diagnosis not present

## 2014-05-19 DIAGNOSIS — Z79899 Other long term (current) drug therapy: Secondary | ICD-10-CM | POA: Diagnosis not present

## 2014-06-08 ENCOUNTER — Other Ambulatory Visit: Payer: Self-pay | Admitting: Family Medicine

## 2014-06-15 ENCOUNTER — Other Ambulatory Visit: Payer: Self-pay | Admitting: Family Medicine

## 2014-07-08 DIAGNOSIS — M549 Dorsalgia, unspecified: Secondary | ICD-10-CM | POA: Diagnosis not present

## 2014-07-08 DIAGNOSIS — R109 Unspecified abdominal pain: Secondary | ICD-10-CM | POA: Diagnosis not present

## 2014-07-08 DIAGNOSIS — R1012 Left upper quadrant pain: Secondary | ICD-10-CM | POA: Diagnosis not present

## 2014-07-08 DIAGNOSIS — R110 Nausea: Secondary | ICD-10-CM | POA: Diagnosis not present

## 2014-07-17 ENCOUNTER — Other Ambulatory Visit: Payer: Self-pay | Admitting: Family Medicine

## 2014-07-17 ENCOUNTER — Other Ambulatory Visit: Payer: Self-pay | Admitting: Cardiology

## 2014-07-17 DIAGNOSIS — E782 Mixed hyperlipidemia: Secondary | ICD-10-CM | POA: Diagnosis not present

## 2014-07-17 DIAGNOSIS — E038 Other specified hypothyroidism: Secondary | ICD-10-CM | POA: Diagnosis not present

## 2014-07-17 NOTE — Telephone Encounter (Signed)
Refill complete 

## 2014-07-20 ENCOUNTER — Telehealth: Payer: Self-pay | Admitting: *Deleted

## 2014-07-20 NOTE — Telephone Encounter (Signed)
-----   Message from Satira Sark, MD sent at 07/19/2014 10:32 AM EST ----- Reviewed. LDL is 55, well-controlled. TSH normal.

## 2014-07-20 NOTE — Telephone Encounter (Signed)
Patient informed. 

## 2014-08-15 ENCOUNTER — Other Ambulatory Visit: Payer: Self-pay | Admitting: Family Medicine

## 2014-08-16 ENCOUNTER — Other Ambulatory Visit: Payer: Self-pay | Admitting: *Deleted

## 2014-08-16 MED ORDER — ALPRAZOLAM 0.25 MG PO TABS: 0.2500 mg | ORAL_TABLET | Freq: Three times a day (TID) | ORAL | Status: DC | PRN

## 2014-08-29 ENCOUNTER — Encounter (HOSPITAL_COMMUNITY): Payer: Self-pay | Admitting: *Deleted

## 2014-08-29 ENCOUNTER — Emergency Department (HOSPITAL_COMMUNITY): Payer: Medicare Other

## 2014-08-29 ENCOUNTER — Emergency Department (HOSPITAL_COMMUNITY)
Admission: EM | Admit: 2014-08-29 | Discharge: 2014-08-29 | Disposition: A | Discharge status: Home / Self Care | Payer: Medicare Other | Attending: Emergency Medicine | Admitting: Emergency Medicine

## 2014-08-29 DIAGNOSIS — R51 Headache: Secondary | ICD-10-CM | POA: Diagnosis not present

## 2014-08-29 DIAGNOSIS — Y9389 Activity, other specified: Secondary | ICD-10-CM | POA: Diagnosis not present

## 2014-08-29 DIAGNOSIS — W19XXXA Unspecified fall, initial encounter: Secondary | ICD-10-CM

## 2014-08-29 DIAGNOSIS — W108XXA Fall (on) (from) other stairs and steps, initial encounter: Secondary | ICD-10-CM | POA: Diagnosis not present

## 2014-08-29 DIAGNOSIS — Z720 Tobacco use: Secondary | ICD-10-CM | POA: Diagnosis not present

## 2014-08-29 DIAGNOSIS — Z9889 Other specified postprocedural states: Secondary | ICD-10-CM | POA: Diagnosis not present

## 2014-08-29 DIAGNOSIS — S81812A Laceration without foreign body, left lower leg, initial encounter: Secondary | ICD-10-CM | POA: Diagnosis not present

## 2014-08-29 DIAGNOSIS — S81811A Laceration without foreign body, right lower leg, initial encounter: Secondary | ICD-10-CM

## 2014-08-29 DIAGNOSIS — F329 Major depressive disorder, single episode, unspecified: Secondary | ICD-10-CM | POA: Diagnosis not present

## 2014-08-29 DIAGNOSIS — I2510 Atherosclerotic heart disease of native coronary artery without angina pectoris: Secondary | ICD-10-CM | POA: Diagnosis not present

## 2014-08-29 DIAGNOSIS — I252 Old myocardial infarction: Secondary | ICD-10-CM | POA: Insufficient documentation

## 2014-08-29 DIAGNOSIS — Z7902 Long term (current) use of antithrombotics/antiplatelets: Secondary | ICD-10-CM | POA: Insufficient documentation

## 2014-08-29 DIAGNOSIS — Z8639 Personal history of other endocrine, nutritional and metabolic disease: Secondary | ICD-10-CM | POA: Diagnosis not present

## 2014-08-29 DIAGNOSIS — Z79899 Other long term (current) drug therapy: Secondary | ICD-10-CM | POA: Diagnosis not present

## 2014-08-29 DIAGNOSIS — Z7952 Long term (current) use of systemic steroids: Secondary | ICD-10-CM | POA: Diagnosis not present

## 2014-08-29 DIAGNOSIS — Z9861 Coronary angioplasty status: Secondary | ICD-10-CM | POA: Diagnosis not present

## 2014-08-29 DIAGNOSIS — Y998 Other external cause status: Secondary | ICD-10-CM | POA: Insufficient documentation

## 2014-08-29 DIAGNOSIS — I10 Essential (primary) hypertension: Secondary | ICD-10-CM | POA: Diagnosis not present

## 2014-08-29 DIAGNOSIS — M47812 Spondylosis without myelopathy or radiculopathy, cervical region: Secondary | ICD-10-CM | POA: Diagnosis not present

## 2014-08-29 DIAGNOSIS — S0990XA Unspecified injury of head, initial encounter: Secondary | ICD-10-CM | POA: Diagnosis not present

## 2014-08-29 DIAGNOSIS — F419 Anxiety disorder, unspecified: Secondary | ICD-10-CM | POA: Insufficient documentation

## 2014-08-29 DIAGNOSIS — Y92009 Unspecified place in unspecified non-institutional (private) residence as the place of occurrence of the external cause: Secondary | ICD-10-CM | POA: Insufficient documentation

## 2014-08-29 DIAGNOSIS — J45909 Unspecified asthma, uncomplicated: Secondary | ICD-10-CM | POA: Diagnosis not present

## 2014-08-29 DIAGNOSIS — K219 Gastro-esophageal reflux disease without esophagitis: Secondary | ICD-10-CM | POA: Diagnosis not present

## 2014-08-29 MED ORDER — CEPHALEXIN 500 MG PO CAPS
500.0000 mg | ORAL_CAPSULE | Freq: Once | ORAL | Status: AC
  Administered 2014-08-29: 500 mg via ORAL
  Filled 2014-08-29: qty 1

## 2014-08-29 MED ORDER — CEPHALEXIN 500 MG PO CAPS: 500.0000 mg | ORAL_CAPSULE | Freq: Two times a day (BID) | ORAL | Status: DC

## 2014-08-29 MED ORDER — LIDOCAINE-EPINEPHRINE (PF) 2 %-1:200000 IJ SOLN
INTRAMUSCULAR | Status: AC
  Administered 2014-08-29: 18:00:00
  Filled 2014-08-29: qty 20

## 2014-08-29 MED ORDER — BACITRACIN-NEOMYCIN-POLYMYXIN 400-5-5000 EX OINT
TOPICAL_OINTMENT | Freq: Once | CUTANEOUS | Status: AC
  Administered 2014-08-29: 1 via TOPICAL
  Filled 2014-08-29: qty 1

## 2014-08-29 MED ORDER — HYDROCODONE-ACETAMINOPHEN 5-325 MG PO TABS: 1.0000 | ORAL_TABLET | Freq: Four times a day (QID) | ORAL | Status: DC | PRN

## 2014-08-29 NOTE — ED Notes (Signed)
Pt fell at home. Laceration to her right shin, bleeding is controlled. Pt did hit her head, states she can't remember anything and is unsure if she lost consciousness. Pt is on plavix. NAD noted. Pt is alert and oriented.

## 2014-08-29 NOTE — ED Notes (Addendum)
MD at bedside doing suture.

## 2014-08-29 NOTE — Discharge Instructions (Signed)
Clean with soap and water 2 times a day.  Sutures out in 2 weeks.   Follow up with your md in 2-3 days for recheck

## 2014-08-29 NOTE — ED Notes (Signed)
Mary from [facility] called and states that pt took off her C-collar while in CT and refuses to put it back on.

## 2014-08-29 NOTE — ED Provider Notes (Signed)
CSN: [id number]     Arrival date & time 08/29/14  1456 History  This chart was scribed for Maudry Diego, MD by Einar Pheasant, ED Scribe. This patient was seen in room APA06/[facility]06 and the patient's care was started at 3:35 PM.     Chief Complaint  Patient presents with  . Fall    Patient is a 59 y.o. female presenting with fall. The history is provided by the patient and medical records. No language interpreter was used.  Fall This is a new problem. The current episode started less than 1 hour ago. The problem occurs rarely. The problem has not changed since onset.Associated symptoms include headaches. Pertinent negatives include no chest pain and no abdominal pain. Nothing aggravates the symptoms. Nothing relieves the symptoms. She has tried nothing for the symptoms.    HPI Comments: [name] is a 59 y.o. female with PMhx of anxiety, depression, GERD, and asthma listed below was brought to the Emergency Department by EMS complaining of a fall that occurred 30-45 minutes ago while at home. Pt states that she was going down the stairs when she fell at the bottom of the stair. She endorses LOC. Pt states that after regaining consciousness she got up by herself and called EMS. Complaining of associated right knee leg pain, laceration underneath right knee, and HA. Bleeding is controlled. Last tetanus vaccine was in 2009. Pt denies fever, neck pain, sore throat, visual disturbance, CP, cough, SOB, abdominal pain, nausea, emesis, diarrhea, urinary symptoms, back pain, HA, weakness, numbness and rash as associated symptoms.      Past Medical History  Diagnosis Date  . Anxiety   . Depression     Prior suicide attempt  . GERD (gastroesophageal reflux disease)   . Essential hypertension, benign   . Hypothyroidism   . Asthma   . Coronary atherosclerosis of native coronary artery     Nonobstructive circ/RCA, DES LAD/diagonal 2005, LVEF 60%  . Palpitations     PVC's have been noted  .  NSTEMI (non-ST elevated myocardial infarction) 2005  . Fibromyalgia    Past Surgical History  Procedure Laterality Date  . Tubal ligation    . Foot bone excision      Sesmoid bone left foot-repair of fracture  . Carpal tunnel release  2007 and 2008    Bilateral  . Cardiac catheterization    . Coronary angioplasty     Family History  Problem Relation Age of Onset  . Anesthesia problems Neg Hx   . Hypotension Neg Hx   . Malignant hyperthermia Neg Hx   . Pseudochol deficiency Neg Hx   . Coronary artery disease Father     MI age 87   History  Substance Use Topics  . Smoking status: Current Every Day Smoker -- 0.30 packs/day for 40 years    Types: Cigarettes    Last Attempt to Quit: 08/16/2013  . Smokeless tobacco: Never Used     Comment: 2 ciggs per day  . Alcohol Use: No   OB History    No data available     Review of Systems  Constitutional: Negative for appetite change and fatigue.  HENT: Negative for congestion, ear discharge and sinus pressure.   Eyes: Negative for discharge.  Respiratory: Negative for cough.   Cardiovascular: Negative for chest pain.  Gastrointestinal: Negative for abdominal pain and diarrhea.  Genitourinary: Negative for frequency and hematuria.  Musculoskeletal: Positive for myalgias and arthralgias. Negative for back pain.  Skin: Positive  for wound. Negative for rash.  Neurological: Positive for headaches. Negative for seizures.  Psychiatric/Behavioral: Negative for hallucinations.      Allergies  Review of patient's allergies indicates no known allergies.  Home Medications   Prior to Admission medications   Medication Sig Start Date End Date Taking? Authorizing Provider  albuterol (PROVENTIL HFA;VENTOLIN HFA) 108 (90 BASE) MCG/ACT inhaler Inhale 2 puffs into the lungs every 6 (six) hours as needed for wheezing. 09/16/13   Kathyrn Drown, MD  ALPRAZolam Duanne Moron) 0.25 MG tablet Take 1 tablet (0.25 mg total) by mouth 3 (three) times daily  as needed for anxiety. 08/16/14 08/16/15  Nilda Simmer, NP  atorvastatin (LIPITOR) 80 MG tablet TAKE (1) TABLET BY MOUTH ONCE DAILY. 08/15/14   Kathyrn Drown, MD  clopidogrel (PLAVIX) 75 MG tablet TAKE (1) TABLET BY MOUTH ONCE DAILY. 07/17/14   Satira Sark, MD  cyclobenzaprine (FLEXERIL) 10 MG tablet Take 1 tablet (10 mg total) by mouth every 8 (eight) hours as needed for muscle spasms. 11/09/13 11/09/14  Nilda Simmer, NP  FLUoxetine (PROZAC) 40 MG capsule TAKE 1 CAPSULE BY MOUTH ONCE A DAY. 08/15/14   Kathyrn Drown, MD  HYDROcodone-acetaminophen (NORCO) 10-325 MG per tablet Take 1 tablet by mouth every 6 (six) hours as needed. 04/28/14   Nilda Simmer, NP  hydrocortisone (ANUSOL-HC) 25 MG suppository Place 1 suppository (25 mg total) rectally 2 (two) times daily. 04/28/14   Nilda Simmer, NP  isosorbide mononitrate (IMDUR) 30 MG 24 hr tablet TAKE (1/2) TABLET BY MOUTH DAILY. 01/09/14   Satira Sark, MD  levothyroxine (SYNTHROID, LEVOTHROID) 125 MCG tablet TAKE 1 TABLET BY MOUTH ONCE DAILY. PATIENT NEEDS TO BE SEEN. 08/15/14   Kathyrn Drown, MD  metoprolol (LOPRESSOR) 50 MG tablet TAKE (1) TABLET TWICE DAILY. 07/17/14   Satira Sark, MD  nitroGLYCERIN (NITROSTAT) 0.4 MG SL tablet Place 0.4 mg under the tongue every 5 (five) minutes x 3 doses as needed for chest pain. 11/09/11   Rhonda G Barrett, PA-C  pantoprazole (PROTONIX) 40 MG tablet TAKE 1 TABLET DAILY FOR ACID REFLUX. 08/15/14   Kathyrn Drown, MD   BP 111/82 mmHg  Pulse 60  Temp(Src) 98.3 F (36.8 C)  Resp 20  Ht 5\' 6"  (1.676 m)  Wt 220 lb (99.791 kg)  BMI 35.53 kg/m2  SpO2 97%  Physical Exam  Constitutional: She is oriented to person, place, and time. She appears well-developed.  HENT:  Head: Normocephalic.  Eyes: Conjunctivae and EOM are normal. No scleral icterus.  Neck: Neck supple. No thyromegaly present.  Cardiovascular: Normal rate and regular rhythm.  Exam reveals no gallop and no friction rub.   No  murmur heard. Pulmonary/Chest: No stridor. She has no wheezes. She has no rales. She exhibits no tenderness.  Abdominal: She exhibits no distension. There is no tenderness. There is no rebound.  Musculoskeletal: Normal range of motion. She exhibits no edema.  Lymphadenopathy:    She has no cervical adenopathy.  Neurological: She is oriented to person, place, and time. She exhibits normal muscle tone. Coordination normal.  Skin: No rash noted. No erythema.  6 cm laceration to anterior right lower leg. Neurovascularly intact.   Psychiatric: She has a normal mood and affect. Her behavior is normal.  Nursing note and vitals reviewed.   ED Course  LACERATION REPAIR Date/Time: 08/29/2014 6:05 PM Performed by: Jamy Cleckler L Authorized by: Milton Ferguson L Comments: Pt had an 8 cm lac  to right lower leg.   Lido with epi to numb.   Area cleaned with betadine and saline.   3,  4-0 vicryl sutures and then 12, 4-0 nylon sutures used to close the lac   (including critical care time)  DIAGNOSTIC STUDIES: Oxygen Saturation is 97% on RA, adequate by my interpretation.    COORDINATION OF CARE: 3:40 PM- Will order X-ray of right leg. Will perform a laceration repair. Pt advised of plan for treatment and pt agrees.  Medications - No data to display  Imaging Review No results found.  LACERATION REPAIR    MDM   Final diagnoses:  None   8cm lac to right lower leg.    tx with keflex and follow up   The chart was scribed for me under my direct supervision.  I personally performed the history, physical, and medical decision making and all procedures in the evaluation of this patient.Maudry Diego, MD 08/30/14 [phone]

## 2014-08-29 NOTE — Progress Notes (Signed)
Pt removed her c-collar while in CT.  She stated that it wasn't put on until she got here and she didn't need it.  Pt's earrings and necklace where placed in a ziploc bag by patient on were on the stretcher with her when she left CT.

## 2014-09-01 ENCOUNTER — Ambulatory Visit (INDEPENDENT_AMBULATORY_CARE_PROVIDER_SITE_OTHER): Payer: Medicare Other | Admitting: Family Medicine

## 2014-09-01 VITALS — BP 110/68 | Ht 66.0 in | Wt 220.0 lb

## 2014-09-01 DIAGNOSIS — L03115 Cellulitis of right lower limb: Secondary | ICD-10-CM

## 2014-09-01 MED ORDER — HYDROCODONE-ACETAMINOPHEN 10-325 MG PO TABS: 1.0000 | ORAL_TABLET | ORAL | Status: DC | PRN

## 2014-09-01 MED ORDER — AMOXICILLIN-POT CLAVULANATE ER 1000-62.5 MG PO TB12: 2.0000 | ORAL_TABLET | Freq: Two times a day (BID) | ORAL | Status: DC

## 2014-09-01 NOTE — Progress Notes (Signed)
   Subjective:    Patient ID: [name], female    DOB: 11-Jun-1956, 59 y.o.   MRN: [id number]  HPIFell and went to ED on 1/26. Having right knee pain since fall. Sore spot on head.  She denies any high fevers denies headaches nausea vomiting diarrhea. No sweats or chills. Please see ER note. PMH benign.   Review of Systems     Objective:   Physical Exam  She has a large laceration on right leg with some localized redness no pus drainage from the knee itself slightly swollen but not red or tender or fever      Assessment & Plan:  Patient with significant laceration on the leg. In addition to this I am concerned about the possibility of some localized infection. They placed her on Keflex she needs to be on Augmentin. The pharmacy that she goes to stated that they only carry 875 mg twice a day. I stated that this would be fine. Patient will do warm compresses she will notify us if getting worse she will follow-up in approximately 12 more days for suture removal  I reviewed over the x-rays I find no evidence of a fracture I do not recommend any additional x-rays. She was warned that if she starts having high fevers pus drainage or other problems follow-up immediately here or ER. Prescription for pain medicine was provided. Caution drowsiness.

## 2014-09-12 ENCOUNTER — Encounter: Payer: Self-pay | Admitting: Family Medicine

## 2014-09-12 ENCOUNTER — Ambulatory Visit (INDEPENDENT_AMBULATORY_CARE_PROVIDER_SITE_OTHER): Payer: Medicare Other | Admitting: Family Medicine

## 2014-09-12 VITALS — BP 122/88 | Ht 66.0 in | Wt 222.0 lb

## 2014-09-12 DIAGNOSIS — R7301 Impaired fasting glucose: Secondary | ICD-10-CM | POA: Diagnosis not present

## 2014-09-12 DIAGNOSIS — I10 Essential (primary) hypertension: Secondary | ICD-10-CM

## 2014-09-12 DIAGNOSIS — E782 Mixed hyperlipidemia: Secondary | ICD-10-CM

## 2014-09-12 DIAGNOSIS — Z79899 Other long term (current) drug therapy: Secondary | ICD-10-CM | POA: Diagnosis not present

## 2014-09-12 DIAGNOSIS — D508 Other iron deficiency anemias: Secondary | ICD-10-CM

## 2014-09-12 DIAGNOSIS — Z23 Encounter for immunization: Secondary | ICD-10-CM | POA: Diagnosis not present

## 2014-09-12 MED ORDER — ATORVASTATIN CALCIUM 80 MG PO TABS: ORAL_TABLET | ORAL | Status: DC

## 2014-09-12 MED ORDER — PANTOPRAZOLE SODIUM 40 MG PO TBEC: DELAYED_RELEASE_TABLET | ORAL | Status: DC

## 2014-09-12 MED ORDER — FLUOXETINE HCL 40 MG PO CAPS: ORAL_CAPSULE | ORAL | Status: DC

## 2014-09-12 MED ORDER — LEVOTHYROXINE SODIUM 125 MCG PO TABS: ORAL_TABLET | ORAL | Status: DC

## 2014-09-12 NOTE — Progress Notes (Signed)
   Subjective:    Patient ID: [name], female    DOB: 1955-08-27, 59 y.o.   MRN: [id number]  HPI Patient is here today for a f/u on a fall.  She had sutures in her right leg, under knee.  Pt removed her sutures herself yesterday.  She did not have any difficulty removing the sutures. No drainage or pus. She does have sent tenderness and soreness. She understands the importance of watching her diet try to lose weight. She understands importance watching diet taking her cholesterol medicine. She is on medications that could cause blood loss we will check CBC Right arm pain occas numb in hand Review of Systems Some leg pain where she had a laceration    Objective:   Physical Exam  Lungs clear heart regular pulse normal. On her leg is healing up it does have some areas that are getting end up with scarring.      Assessment & Plan:  Colon recommended If she has problems with the leg laceration she is to follow-up Refills on chronic medications given If ongoing trouble with right arm pain and intermittent numbness in the hand she may well benefit from MRI of the cervical spine currently she defers on this She was counseled to lose weight

## 2014-09-12 NOTE — Patient Instructions (Signed)
Smoking Cessation Quitting smoking is important to your health and has many advantages. However, it is not always easy to quit since nicotine is a very addictive drug. Oftentimes, people try 3 times or more before being able to quit. This document explains the best ways for you to prepare to quit smoking. Quitting takes hard work and a lot of effort, but you can do it. ADVANTAGES OF QUITTING SMOKING  You will live longer, feel better, and live better.  Your body will feel the impact of quitting smoking almost immediately.  Within 20 minutes, blood pressure decreases. Your pulse returns to its normal level.  After 8 hours, carbon monoxide levels in the blood return to normal. Your oxygen level increases.  After 24 hours, the chance of having a heart attack starts to decrease. Your breath, hair, and body stop smelling like smoke.  After 48 hours, damaged nerve endings begin to recover. Your sense of taste and smell improve.  After 72 hours, the body is virtually free of nicotine. Your bronchial tubes relax and breathing becomes easier.  After 2 to 12 weeks, lungs can hold more air. Exercise becomes easier and circulation improves.  The risk of having a heart attack, stroke, cancer, or lung disease is greatly reduced.  After 1 year, the risk of coronary heart disease is cut in half.  After 5 years, the risk of stroke falls to the same as a nonsmoker.  After 10 years, the risk of lung cancer is cut in half and the risk of other cancers decreases significantly.  After 15 years, the risk of coronary heart disease drops, usually to the level of a nonsmoker.  If you are pregnant, quitting smoking will improve your chances of having a healthy baby.  The people you live with, especially any children, will be healthier.  You will have extra money to spend on things other than cigarettes. QUESTIONS TO THINK ABOUT BEFORE ATTEMPTING TO QUIT You may want to talk about your answers with your  health care provider.  Why do you want to quit?  If you tried to quit in the past, what helped and what did not?  What will be the most difficult situations for you after you quit? How will you plan to handle them?  Who can help you through the tough times? Your family? Friends? A health care provider?  What pleasures do you get from smoking? What ways can you still get pleasure if you quit? Here are some questions to ask your health care provider:  How can you help me to be successful at quitting?  What medicine do you think would be best for me and how should I take it?  What should I do if I need more help?  What is smoking withdrawal like? How can I get information on withdrawal? GET READY  Set a quit date.  Change your environment by getting rid of all cigarettes, ashtrays, matches, and lighters in your home, car, or work. Do not let people smoke in your home.  Review your past attempts to quit. Think about what worked and what did not. GET SUPPORT AND ENCOURAGEMENT You have a better chance of being successful if you have help. You can get support in many ways.  Tell your family, friends, and coworkers that you are going to quit and need their support. Ask them not to smoke around you.  Get individual, group, or telephone counseling and support. Programs are available at local hospitals and health centers. Call   your local health department for information about programs in your area.  Spiritual beliefs and practices may help some smokers quit.  Download a "quit meter" on your computer to keep track of quit statistics, such as how long you have gone without smoking, cigarettes not smoked, and money saved.  Get a self-help book about quitting smoking and staying off tobacco. LEARN NEW SKILLS AND BEHAVIORS  Distract yourself from urges to smoke. Talk to someone, go for a walk, or occupy your time with a task.  Change your normal routine. Take a different route to work.  Drink tea instead of coffee. Eat breakfast in a different place.  Reduce your stress. Take a hot bath, exercise, or read a book.  Plan something enjoyable to do every day. Reward yourself for not smoking.  Explore interactive web-based programs that specialize in helping you quit. GET MEDICINE AND USE IT CORRECTLY Medicines can help you stop smoking and decrease the urge to smoke. Combining medicine with the above behavioral methods and support can greatly increase your chances of successfully quitting smoking.  Nicotine replacement therapy helps deliver nicotine to your body without the negative effects and risks of smoking. Nicotine replacement therapy includes nicotine gum, lozenges, inhalers, nasal sprays, and skin patches. Some may be available over-the-counter and others require a prescription.  Antidepressant medicine helps people abstain from smoking, but how this works is unknown. This medicine is available by prescription.  Nicotinic receptor partial agonist medicine simulates the effect of nicotine in your brain. This medicine is available by prescription. Ask your health care provider for advice about which medicines to use and how to use them based on your health history. Your health care provider will tell you what side effects to look out for if you choose to be on a medicine or therapy. Carefully read the information on the package. Do not use any other product containing nicotine while using a nicotine replacement product.  RELAPSE OR DIFFICULT SITUATIONS Most relapses occur within the first 3 months after quitting. Do not be discouraged if you start smoking again. Remember, most people try several times before finally quitting. You may have symptoms of withdrawal because your body is used to nicotine. You may crave cigarettes, be irritable, feel very hungry, cough often, get headaches, or have difficulty concentrating. The withdrawal symptoms are only temporary. They are strongest  when you first quit, but they will go away within 10-14 days. To reduce the chances of relapse, try to:  Avoid drinking alcohol. Drinking lowers your chances of successfully quitting.  Reduce the amount of caffeine you consume. Once you quit smoking, the amount of caffeine in your body increases and can give you symptoms, such as a rapid heartbeat, sweating, and anxiety.  Avoid smokers because they can make you want to smoke.  Do not let weight gain distract you. Many smokers will gain weight when they quit, usually less than 10 pounds. Eat a healthy diet and stay active. You can always lose the weight gained after you quit.  Find ways to improve your mood other than smoking. FOR MORE INFORMATION  www.smokefree.gov  Document Released: 07/15/2001 Document Revised: 12/05/2013 Document Reviewed: 10/30/2011 Exit[employer] Patient Information 2015 [employer]. This information is not intended to replace advice given to you by your health care provider. Make sure you discuss any questions you have with your health care provider.  

## 2014-09-15 ENCOUNTER — Other Ambulatory Visit: Payer: Self-pay | Admitting: Family Medicine

## 2014-09-20 ENCOUNTER — Other Ambulatory Visit: Payer: Self-pay | Admitting: *Deleted

## 2014-09-20 MED ORDER — PERMETHRIN 5 % EX CREA: 1.0000 "application " | TOPICAL_CREAM | Freq: Once | CUTANEOUS | Status: DC

## 2014-10-19 ENCOUNTER — Other Ambulatory Visit: Payer: Self-pay | Admitting: *Deleted

## 2014-10-20 ENCOUNTER — Other Ambulatory Visit: Payer: Self-pay | Admitting: *Deleted

## 2014-10-20 MED ORDER — ALPRAZOLAM 0.25 MG PO TABS: 0.2500 mg | ORAL_TABLET | Freq: Three times a day (TID) | ORAL | Status: DC | PRN

## 2014-10-20 MED ORDER — ALPRAZOLAM 0.25 MG PO TABS: 0.2500 mg | ORAL_TABLET | Freq: Three times a day (TID) | ORAL | Status: AC | PRN

## 2014-10-20 NOTE — Telephone Encounter (Signed)
May have this +4 additional refills 

## 2014-11-09 ENCOUNTER — Other Ambulatory Visit: Payer: Self-pay | Admitting: *Deleted

## 2014-11-09 MED ORDER — ATORVASTATIN CALCIUM 80 MG PO TABS: 80.0000 mg | ORAL_TABLET | Freq: Every day | ORAL | Status: DC

## 2015-01-25 ENCOUNTER — Other Ambulatory Visit: Payer: Self-pay | Admitting: Cardiology

## 2015-02-26 ENCOUNTER — Telehealth: Payer: Self-pay | Admitting: *Deleted

## 2015-02-26 ENCOUNTER — Other Ambulatory Visit: Payer: Self-pay | Admitting: Family Medicine

## 2015-02-26 MED ORDER — ISOSORBIDE MONONITRATE ER 30 MG PO TB24: ORAL_TABLET | ORAL | Status: DC

## 2015-02-26 NOTE — Telephone Encounter (Signed)
Medication sent to pharmacy  

## 2015-02-27 ENCOUNTER — Other Ambulatory Visit: Payer: Self-pay | Admitting: *Deleted

## 2015-02-27 MED ORDER — ATORVASTATIN CALCIUM 80 MG PO TABS: 80.0000 mg | ORAL_TABLET | Freq: Every day | ORAL | Status: DC

## 2015-02-27 MED ORDER — FLUOXETINE HCL 40 MG PO CAPS: ORAL_CAPSULE | ORAL | Status: DC

## 2015-02-27 MED ORDER — LEVOTHYROXINE SODIUM 125 MCG PO TABS: ORAL_TABLET | ORAL | Status: DC

## 2015-02-27 MED ORDER — PANTOPRAZOLE SODIUM 40 MG PO TBEC: DELAYED_RELEASE_TABLET | ORAL | Status: DC

## 2015-03-13 ENCOUNTER — Ambulatory Visit: Payer: Medicare Other | Admitting: Family Medicine

## 2015-04-02 ENCOUNTER — Other Ambulatory Visit: Payer: Self-pay | Admitting: Cardiology

## 2015-04-02 ENCOUNTER — Other Ambulatory Visit: Payer: Self-pay | Admitting: Family Medicine

## 2015-05-03 ENCOUNTER — Other Ambulatory Visit: Payer: Self-pay | Admitting: Family Medicine

## 2015-05-03 DIAGNOSIS — E782 Mixed hyperlipidemia: Secondary | ICD-10-CM | POA: Diagnosis not present

## 2015-05-03 DIAGNOSIS — D508 Other iron deficiency anemias: Secondary | ICD-10-CM | POA: Diagnosis not present

## 2015-05-03 DIAGNOSIS — I10 Essential (primary) hypertension: Secondary | ICD-10-CM | POA: Diagnosis not present

## 2015-05-03 DIAGNOSIS — R7301 Impaired fasting glucose: Secondary | ICD-10-CM | POA: Diagnosis not present

## 2015-05-03 DIAGNOSIS — Z79899 Other long term (current) drug therapy: Secondary | ICD-10-CM | POA: Diagnosis not present

## 2015-05-04 ENCOUNTER — Other Ambulatory Visit: Payer: Self-pay | Admitting: Family Medicine

## 2015-05-04 LAB — CBC WITH DIFFERENTIAL/PLATELET
Basophils Absolute: 0.1 10*3/uL (ref 0.0–0.2)
Basos: 1 %
EOS (ABSOLUTE): 0.5 10*3/uL — ABNORMAL HIGH (ref 0.0–0.4)
Eos: 6 %
Hematocrit: 38.9 % (ref 34.0–46.6)
Hemoglobin: 12.9 g/dL (ref 11.1–15.9)
Immature Grans (Abs): 0 10*3/uL (ref 0.0–0.1)
Immature Granulocytes: 0 %
Lymphocytes Absolute: 2.4 10*3/uL (ref 0.7–3.1)
Lymphs: 29 %
MCH: 30.7 pg (ref 26.6–33.0)
MCHC: 33.2 g/dL (ref 31.5–35.7)
MCV: 93 fL (ref 79–97)
Monocytes Absolute: 0.9 10*3/uL (ref 0.1–0.9)
Monocytes: 11 %
Neutrophils Absolute: 4.5 10*3/uL (ref 1.4–7.0)
Neutrophils: 53 %
Platelets: 307 10*3/uL (ref 150–379)
RBC: 4.2 x10E6/uL (ref 3.77–5.28)
RDW: 15.5 % — ABNORMAL HIGH (ref 12.3–15.4)
WBC: 8.4 10*3/uL (ref 3.4–10.8)

## 2015-05-04 LAB — HEMOGLOBIN A1C
Est. average glucose Bld gHb Est-mCnc: 128 mg/dL
Hgb A1c MFr Bld: 6.1 % — ABNORMAL HIGH (ref 4.8–5.6)

## 2015-05-04 LAB — BASIC METABOLIC PANEL
BUN/Creatinine Ratio: 8 — ABNORMAL LOW (ref 11–26)
BUN: 9 mg/dL (ref 8–27)
CO2: 25 mmol/L (ref 18–29)
Calcium: 9.5 mg/dL (ref 8.7–10.3)
Chloride: 102 mmol/L (ref 97–108)
Creatinine, Ser: 1.09 mg/dL — ABNORMAL HIGH (ref 0.57–1.00)
GFR calc Af Amer: 64 mL/min/{1.73_m2} (ref >59)
GFR calc non Af Amer: 55 mL/min/{1.73_m2} — ABNORMAL LOW (ref >59)
Glucose: 110 mg/dL — ABNORMAL HIGH (ref 65–99)
Potassium: 4.2 mmol/L (ref 3.5–5.2)
Sodium: 144 mmol/L (ref 134–144)

## 2015-05-04 LAB — LIPID PANEL
Chol/HDL Ratio: 2.8 ratio units (ref 0.0–4.4)
Cholesterol, Total: 139 mg/dL (ref 100–199)
HDL: 50 mg/dL (ref >39)
LDL Calculated: 66 mg/dL (ref 0–99)
Triglycerides: 117 mg/dL (ref 0–149)
VLDL Cholesterol Cal: 23 mg/dL (ref 5–40)

## 2015-05-04 LAB — HEPATIC FUNCTION PANEL
ALT: 14 IU/L (ref 0–32)
AST: 16 IU/L (ref 0–40)
Albumin: 4 g/dL (ref 3.6–4.8)
Alkaline Phosphatase: 140 IU/L — ABNORMAL HIGH (ref 39–117)
Bilirubin Total: 0.3 mg/dL (ref 0.0–1.2)
Bilirubin, Direct: 0.1 mg/dL (ref 0.00–0.40)
Total Protein: 7.2 g/dL (ref 6.0–8.5)

## 2015-05-06 ENCOUNTER — Encounter: Payer: Self-pay | Admitting: Family Medicine

## 2015-05-09 ENCOUNTER — Other Ambulatory Visit: Payer: Self-pay | Admitting: Family Medicine

## 2015-06-13 ENCOUNTER — Ambulatory Visit (INDEPENDENT_AMBULATORY_CARE_PROVIDER_SITE_OTHER): Payer: Medicare Other | Admitting: Family Medicine

## 2015-06-13 ENCOUNTER — Encounter: Payer: Self-pay | Admitting: Family Medicine

## 2015-06-13 ENCOUNTER — Encounter (INDEPENDENT_AMBULATORY_CARE_PROVIDER_SITE_OTHER): Payer: Self-pay | Admitting: *Deleted

## 2015-06-13 VITALS — BP 112/78 | Temp 98.5°F | Ht 66.0 in | Wt 221.0 lb

## 2015-06-13 DIAGNOSIS — R8290 Unspecified abnormal findings in urine: Secondary | ICD-10-CM

## 2015-06-13 DIAGNOSIS — E039 Hypothyroidism, unspecified: Secondary | ICD-10-CM

## 2015-06-13 DIAGNOSIS — Z1211 Encounter for screening for malignant neoplasm of colon: Secondary | ICD-10-CM

## 2015-06-13 DIAGNOSIS — Z23 Encounter for immunization: Secondary | ICD-10-CM

## 2015-06-13 LAB — POCT URINALYSIS DIPSTICK
Spec Grav, UA: <=1.005
pH, UA: 7

## 2015-06-13 MED ORDER — METOPROLOL TARTRATE 50 MG PO TABS: ORAL_TABLET | ORAL | Status: DC

## 2015-06-13 MED ORDER — FLUOXETINE HCL 20 MG PO TABS: ORAL_TABLET | ORAL | Status: DC

## 2015-06-13 MED ORDER — LEVALBUTEROL TARTRATE 45 MCG/ACT IN AERO: 2.0000 | INHALATION_SPRAY | RESPIRATORY_TRACT | Status: DC | PRN

## 2015-06-13 MED ORDER — CLOPIDOGREL BISULFATE 75 MG PO TABS: 75.0000 mg | ORAL_TABLET | Freq: Every day | ORAL | Status: DC

## 2015-06-13 MED ORDER — PANTOPRAZOLE SODIUM 40 MG PO TBEC
DELAYED_RELEASE_TABLET | ORAL | Status: DC
Start: 2015-06-13 — End: 2015-12-29

## 2015-06-13 MED ORDER — ATORVASTATIN CALCIUM 80 MG PO TABS: 80.0000 mg | ORAL_TABLET | Freq: Every day | ORAL | Status: DC

## 2015-06-13 MED ORDER — ISOSORBIDE MONONITRATE ER 30 MG PO TB24
ORAL_TABLET | ORAL | Status: DC
Start: 2015-06-13 — End: 2016-06-20

## 2015-06-13 MED ORDER — LEVOTHYROXINE SODIUM 125 MCG PO TABS
ORAL_TABLET | ORAL | Status: DC
Start: 2015-06-13 — End: 2015-10-24

## 2015-06-13 MED ORDER — HYDROCODONE-ACETAMINOPHEN 10-325 MG PO TABS: 1.0000 | ORAL_TABLET | ORAL | Status: DC | PRN

## 2015-06-13 NOTE — Progress Notes (Signed)
   Subjective:    Patient ID: [name], female    DOB: [birth year], 59 y.o.   MRN: [id number]  Hyperlipidemia This is a chronic problem. The current episode started more than 1 year ago. Pertinent negatives include no chest pain. Treatments tried: atorvastatin.   abd pain for the past year. Off and on. Admitted to hospital about 1 and half years ago for pancreatitis.  Intermittent abdominal pain she is due for colonoscopy. We will be helping her with referral she denies vomiting weight loss bloody stools  Fibromyalgia pain. Uses tylenol and hydrocodone. Pt states the rx from January has lasted her until now.  She uses hydrocodone mainly for her low back pain off and on. Uses it sparingly denies drowsiness with it.  patient denies any cardiac symptoms no chest tightness pressure pain shortness of breath she has tried stay active try to lose some weight. Odor to urine for the past month. Patient denies dysuria or urinary from C.  Flu vaccine today.   Needs refills on all meds.   Pt increased her own prozac from '40mg'$  to '60mg'$ .   Review of Systems  Constitutional: Negative for activity change, appetite change and fatigue.  HENT: Negative for congestion.   Respiratory: Negative for cough.   Cardiovascular: Negative for chest pain.  Gastrointestinal: Negative for abdominal pain.  Endocrine: Negative for polydipsia and polyphagia.  Musculoskeletal: Positive for back pain and arthralgias.  Neurological: Negative for weakness.  Psychiatric/Behavioral: Negative for confusion.       Objective:   Physical Exam  Constitutional: She appears well-nourished. No distress.  Cardiovascular: Normal rate, regular rhythm and normal heart sounds.   No murmur heard. Pulmonary/Chest: Effort normal and breath sounds normal. No respiratory distress.  Musculoskeletal: She exhibits no edema.  Lymphadenopathy:    She has no cervical adenopathy.  Neurological: She is alert. She exhibits normal  muscle tone.  Psychiatric: Her behavior is normal.  Vitals reviewed.    25 minutes was spent with the patient. Greater than half the time was spent in discussion and answering questions and counseling regarding the issues that the patient came in for today.      Assessment & Plan:   I believe the fibromyalgia is contributing to her muscle and joint pain.   patient has chronic back pain uses pain medication for this sparingly. She knows not to abuse it or double up on it or take with any other medicine that causes drowsiness  patient denies any chest tightness pressure pain continue current medications  thyroid checked TSH later this year continue medication  cholesterol watch diet closely continue medication recent lab work looked good  patient denies any breathing issues does not smoke  We will culture her urine nothing under the microscope  Patient states stress levels been elevated she is doing better on a higher dose of Prozac we will prescribe 60 mg a day patient to follow-up within 6 months sooner if any problems

## 2015-06-15 LAB — URINE CULTURE

## 2015-06-18 ENCOUNTER — Other Ambulatory Visit: Payer: Self-pay | Admitting: *Deleted

## 2015-06-18 MED ORDER — FLUCONAZOLE 150 MG PO TABS: 150.0000 mg | ORAL_TABLET | Freq: Once | ORAL | Status: DC

## 2015-06-18 MED ORDER — CEFPROZIL 500 MG PO TABS: 500.0000 mg | ORAL_TABLET | Freq: Two times a day (BID) | ORAL | Status: DC

## 2015-08-05 DIAGNOSIS — I219 Acute myocardial infarction, unspecified: Secondary | ICD-10-CM

## 2015-08-05 HISTORY — DX: Acute myocardial infarction, unspecified: I21.9

## 2015-08-31 ENCOUNTER — Telehealth: Payer: Self-pay | Admitting: Family Medicine

## 2015-08-31 NOTE — Telephone Encounter (Signed)
HYDROcodone-acetaminophen (NORCO) 10-325 MG tablet   Pt needs refill please

## 2015-08-31 NOTE — Telephone Encounter (Signed)
Last seen 06/13/15 for a med check

## 2015-08-31 NOTE — Telephone Encounter (Signed)
She may have one prescription. She needs to understand that she will need to follow-up in person for office visit before additional prescriptions can be given

## 2015-09-03 ENCOUNTER — Other Ambulatory Visit: Payer: Self-pay | Admitting: *Deleted

## 2015-09-03 MED ORDER — HYDROCODONE-ACETAMINOPHEN 10-325 MG PO TABS: 1.0000 | ORAL_TABLET | ORAL | Status: DC | PRN

## 2015-09-03 NOTE — Telephone Encounter (Signed)
Script ready for pickup. Patient has appointment tomorrow and will pick up then.

## 2015-09-04 ENCOUNTER — Ambulatory Visit (INDEPENDENT_AMBULATORY_CARE_PROVIDER_SITE_OTHER): Payer: Medicare Other | Admitting: Family Medicine

## 2015-09-04 ENCOUNTER — Encounter: Payer: Self-pay | Admitting: Family Medicine

## 2015-09-04 ENCOUNTER — Telehealth: Payer: Self-pay | Admitting: *Deleted

## 2015-09-04 VITALS — BP 132/82 | Ht 66.0 in | Wt 225.0 lb

## 2015-09-04 DIAGNOSIS — I10 Essential (primary) hypertension: Secondary | ICD-10-CM | POA: Diagnosis not present

## 2015-09-04 DIAGNOSIS — G959 Disease of spinal cord, unspecified: Secondary | ICD-10-CM

## 2015-09-04 MED ORDER — HYDROCODONE-ACETAMINOPHEN 10-325 MG PO TABS: 1.0000 | ORAL_TABLET | ORAL | Status: DC | PRN

## 2015-09-04 NOTE — Telephone Encounter (Signed)
Left message to return call 

## 2015-09-04 NOTE — Progress Notes (Signed)
   Subjective:    Patient ID: [name], female    DOB: 1956/02/06, 60 y.o.   MRN: [id number]  HPI Patient arrives with c/o neck pain going into her right shoulder and arm for years but worse for one month.   patient is been having this problem all for the past couple years worse over the past 60 days keeping her awake at night describes pain discomfort on the right side of her neck into her shoulder into her arm down into her hand along with numbness. She states that her arm is not as strong as it has been she does drop some things. It does wake her up at night. She is tried anti-inflammatories she is tried stretching and even massage without any help. Patient states that in the past she has tried Cymbalta and Lyrica with significant side effects.  Review of Systems  Constitutional: Negative for fever and fatigue.  HENT: Negative for congestion.   Respiratory: Negative for cough and shortness of breath.   Cardiovascular: Negative for chest pain.  Gastrointestinal: Negative for abdominal pain.       Objective:   Physical Exam   lungs are clear hearts regular pulse normal significant discomfort along the right side of her neck radiates into the right arm reflexes good strength is good  25 minutes was spent with the patient. Greater than half the time was spent in discussion and answering questions and counseling regarding the issues that the patient came in for today. 25 minutes was spent discussing with the patient her previous CAT scan of her neck her current symptomatology in the importance of taking various medications. In addition to this discussion regarding MRI and also seeing a specialist.    Assessment & Plan:  1. Cervical myelopathy ([place]) The patient has failed conservative management. MRI indicated. More than likely will need referral. A CAT scan done with [facility] proximally 2 months ago showed significant foraminal stenosis within the spine which is probably causing her  problem but I cannot rule out possibility of ruptured disc more than likely will need to see neurosurgery or orthopedics specialist as well pain medication prescribed patient denies abusing it. - MR Cervical Spine Wo Contrast  2. Essential hypertension, benign Blood pressure under good control she is taken her medicine on a regular basis  3. Morbid obesity, unspecified obesity type [facility]) Patient understands importance of watching diet staying physically active try to lose weight.

## 2015-09-04 NOTE — Telephone Encounter (Signed)
MRI Cervical Spine w/o contrast @ [facility] 09/19/15 at 1:45pm

## 2015-09-04 NOTE — Telephone Encounter (Signed)
Patient was notified.

## 2015-09-19 ENCOUNTER — Ambulatory Visit (HOSPITAL_COMMUNITY)
Admission: RE | Admit: 2015-09-19 | Discharge: 2015-09-19 | Disposition: A | Discharge status: Home / Self Care | Payer: Medicare Other | Source: Ambulatory Visit | Attending: Family Medicine | Admitting: Family Medicine

## 2015-09-19 DIAGNOSIS — M4802 Spinal stenosis, cervical region: Secondary | ICD-10-CM | POA: Diagnosis not present

## 2015-09-19 DIAGNOSIS — M4682 Other specified inflammatory spondylopathies, cervical region: Secondary | ICD-10-CM | POA: Diagnosis not present

## 2015-09-19 DIAGNOSIS — M542 Cervicalgia: Secondary | ICD-10-CM | POA: Insufficient documentation

## 2015-09-19 DIAGNOSIS — M47812 Spondylosis without myelopathy or radiculopathy, cervical region: Secondary | ICD-10-CM | POA: Diagnosis not present

## 2015-09-19 DIAGNOSIS — M79601 Pain in right arm: Secondary | ICD-10-CM | POA: Insufficient documentation

## 2015-09-19 DIAGNOSIS — M25511 Pain in right shoulder: Secondary | ICD-10-CM | POA: Insufficient documentation

## 2015-09-21 NOTE — Addendum Note (Signed)
Addended by: Dairl Ponder on: 09/21/2015 11:04 AM   Modules accepted: Orders

## 2015-09-27 ENCOUNTER — Encounter: Payer: Self-pay | Admitting: Family Medicine

## 2015-10-08 ENCOUNTER — Other Ambulatory Visit: Payer: Self-pay | Admitting: *Deleted

## 2015-10-08 ENCOUNTER — Telehealth: Payer: Self-pay | Admitting: *Deleted

## 2015-10-08 MED ORDER — ATORVASTATIN CALCIUM 80 MG PO TABS: 80.0000 mg | ORAL_TABLET | Freq: Every day | ORAL | Status: DC

## 2015-10-08 MED ORDER — OXYCODONE-ACETAMINOPHEN 10-325 MG PO TABS: ORAL_TABLET | ORAL | Status: DC

## 2015-10-08 NOTE — Telephone Encounter (Signed)
The next step would be recommending Percocet. 10 mg/325 mg, #40, one half to one tablet every 4 hours when necessary severe pain caution drowsiness. Patient will need to let us know here at the office how this is doing for her plus also if she needs additional prescription. We may need to have her follow-up office visit within the next week and a half if they are unable to get her in with the specialists.

## 2015-10-08 NOTE — Telephone Encounter (Signed)
Discussed with pt. Pt has appt with specialist on the 20th. Pt still wants appt with dr Nicki Reaper this week. May schedule end of the week per dr Nicki Reaper. Script ready for pickup. Pt wants to schedule appt when she picks up script today.

## 2015-10-08 NOTE — Telephone Encounter (Signed)
Pt waiting on referral. Stating she needs some relief now. Taking norco 10/325 one every 4 hours. No relief. Pain in neck and shoulders. Please advise.

## 2015-10-11 ENCOUNTER — Encounter: Payer: Self-pay | Admitting: Family Medicine

## 2015-10-11 ENCOUNTER — Ambulatory Visit (INDEPENDENT_AMBULATORY_CARE_PROVIDER_SITE_OTHER): Payer: Medicare Other | Admitting: Family Medicine

## 2015-10-11 VITALS — BP 102/80 | Ht 66.0 in | Wt 227.1 lb

## 2015-10-11 DIAGNOSIS — M4692 Unspecified inflammatory spondylopathy, cervical region: Secondary | ICD-10-CM

## 2015-10-11 DIAGNOSIS — M47812 Spondylosis without myelopathy or radiculopathy, cervical region: Secondary | ICD-10-CM

## 2015-10-11 MED ORDER — GABAPENTIN 100 MG PO CAPS: 100.0000 mg | ORAL_CAPSULE | Freq: Three times a day (TID) | ORAL | Status: DC

## 2015-10-11 NOTE — Progress Notes (Signed)
   Subjective:    Patient ID: [name], female    DOB: 09-03-1955, 60 y.o.   MRN: [id number]  HPI Patient is here today for a follow up visit on neck pain. Patient states that her neck pain has not improved. Patient wants to discuss MRI. Patient wants to discuss finding out what is causing this pain so she can stop taking these pain pills.    Review of Systems    describes pain in the back of the neck radiates into both arms Objective:   Physical Exam  Subjective discomfort in the upper neck more in the right side into the trapezius than the left side. Respiratory rate normal      Assessment & Plan:  15 minutes spent with patient showing diagrams discussing her MRI discussing treatment options Start gabapentin 100 mg gradually titrate up to 3 times a day If causing drowsiness or if problems let us know Give [place] update on how this medicine is doing in a few weeks time we may need up the dose more Patient has significant cervical arthritis with foraminal stenosis may end up needing have injections or surgery supposed to see neurosurgery in a couple weeks' time Patient states pain medicine helping she uses a half tablet twice daily causes slight drowsiness but nothing severe, patient does not one a prescription today she will call us if she needs a refill.

## 2015-10-22 DIAGNOSIS — Z6836 Body mass index (BMI) 36.0-36.9, adult: Secondary | ICD-10-CM | POA: Diagnosis not present

## 2015-10-22 DIAGNOSIS — M542 Cervicalgia: Secondary | ICD-10-CM | POA: Diagnosis not present

## 2015-10-22 DIAGNOSIS — M5412 Radiculopathy, cervical region: Secondary | ICD-10-CM | POA: Diagnosis not present

## 2015-10-23 ENCOUNTER — Ambulatory Visit (INDEPENDENT_AMBULATORY_CARE_PROVIDER_SITE_OTHER): Payer: Medicare Other | Admitting: Family Medicine

## 2015-10-23 ENCOUNTER — Encounter: Payer: Self-pay | Admitting: Family Medicine

## 2015-10-23 ENCOUNTER — Ambulatory Visit: Payer: Medicare Other | Admitting: Family Medicine

## 2015-10-23 VITALS — Temp 98.1°F | Ht 66.0 in | Wt 228.0 lb

## 2015-10-23 DIAGNOSIS — N23 Unspecified renal colic: Secondary | ICD-10-CM | POA: Diagnosis not present

## 2015-10-23 DIAGNOSIS — R1031 Right lower quadrant pain: Secondary | ICD-10-CM | POA: Diagnosis not present

## 2015-10-23 DIAGNOSIS — E038 Other specified hypothyroidism: Secondary | ICD-10-CM | POA: Diagnosis not present

## 2015-10-23 DIAGNOSIS — K760 Fatty (change of) liver, not elsewhere classified: Secondary | ICD-10-CM | POA: Diagnosis not present

## 2015-10-23 DIAGNOSIS — R300 Dysuria: Secondary | ICD-10-CM

## 2015-10-23 DIAGNOSIS — I10 Essential (primary) hypertension: Secondary | ICD-10-CM

## 2015-10-23 LAB — POCT URINALYSIS DIPSTICK
Spec Grav, UA: 1.015
pH, UA: 6

## 2015-10-23 MED ORDER — HYDROCODONE-ACETAMINOPHEN 10-325 MG PO TABS: 1.0000 | ORAL_TABLET | Freq: Three times a day (TID) | ORAL | Status: DC | PRN

## 2015-10-23 NOTE — Progress Notes (Signed)
   Subjective:    Patient ID: [name], female    DOB: 07/11/1956, 60 y.o.   MRN: [id number]  HPI Urinalysis negative for RBCs Patient arrives with concern of possible kidney stone Over the past few days she has had sharp pains in her back and also pain in the right lower abdomen that she describes as sharp comes and goes no high fever chills with it she relates some pressure when she urinates. She states she's been able to eat okay. Review of Systems Patient relates midabdominal tenderness with right lower quadrant tenderness also sharp pains and flank tenderness.    Objective:   Physical Exam PMH kidney stone years ago Lungs clear hearts regular patient does have some right-sided tenderness she describes as sharp moderate point tenderness no guarding she does have flank tenderness. She also denies vomiting fever chills On physical exam She does have significant tenderness in the right lower quadrant region I doubt appendicitis but it is hard to call this purely kidney stone.   25 minutes was spent with the patient. Greater than half the time was spent in discussion and answering questions and counseling regarding the issues that the patient came in for today.  Assessment & Plan:  Given the increasing pain in the abdominal lower pelvic nature of this I believe a CAT scan is indicated. I do not feel the patient needs a go to the ER tonight but we will set up a CAT scan for tomorrow to help delineate if this is appendicitis or possibility of a pelvic growth or some sort of infection. Await the findings of the scan lab work ordered.

## 2015-10-24 ENCOUNTER — Other Ambulatory Visit: Payer: Self-pay | Admitting: Family Medicine

## 2015-10-24 ENCOUNTER — Ambulatory Visit (HOSPITAL_COMMUNITY)
Admission: RE | Admit: 2015-10-24 | Discharge: 2015-10-24 | Disposition: A | Discharge status: Home / Self Care | Payer: Medicare Other | Source: Ambulatory Visit | Attending: Family Medicine | Admitting: Family Medicine

## 2015-10-24 DIAGNOSIS — K5730 Diverticulosis of large intestine without perforation or abscess without bleeding: Secondary | ICD-10-CM | POA: Insufficient documentation

## 2015-10-24 DIAGNOSIS — R1031 Right lower quadrant pain: Secondary | ICD-10-CM | POA: Insufficient documentation

## 2015-10-24 DIAGNOSIS — M545 Low back pain: Secondary | ICD-10-CM | POA: Insufficient documentation

## 2015-10-24 LAB — CBC WITH DIFFERENTIAL/PLATELET
Basophils Absolute: 0.1 10*3/uL (ref 0.0–0.2)
Basos: 1 %
EOS (ABSOLUTE): 0.6 10*3/uL — ABNORMAL HIGH (ref 0.0–0.4)
Eos: 6 %
Hematocrit: 37.4 % (ref 34.0–46.6)
Hemoglobin: 12.4 g/dL (ref 11.1–15.9)
Immature Grans (Abs): 0 10*3/uL (ref 0.0–0.1)
Immature Granulocytes: 0 %
Lymphocytes Absolute: 2.9 10*3/uL (ref 0.7–3.1)
Lymphs: 29 %
MCH: 30.2 pg (ref 26.6–33.0)
MCHC: 33.2 g/dL (ref 31.5–35.7)
MCV: 91 fL (ref 79–97)
Monocytes Absolute: 0.8 10*3/uL (ref 0.1–0.9)
Monocytes: 8 %
Neutrophils Absolute: 5.6 10*3/uL (ref 1.4–7.0)
Neutrophils: 56 %
Platelets: 319 10*3/uL (ref 150–379)
RBC: 4.1 x10E6/uL (ref 3.77–5.28)
RDW: 14.7 % (ref 12.3–15.4)
WBC: 10 10*3/uL (ref 3.4–10.8)

## 2015-10-24 LAB — BASIC METABOLIC PANEL
BUN/Creatinine Ratio: 10 — ABNORMAL LOW (ref 11–26)
BUN: 10 mg/dL (ref 8–27)
CO2: 24 mmol/L (ref 18–29)
Calcium: 9.7 mg/dL (ref 8.7–10.3)
Chloride: 99 mmol/L (ref 96–106)
Creatinine, Ser: 0.99 mg/dL (ref 0.57–1.00)
GFR calc Af Amer: 71 mL/min/{1.73_m2} (ref >59)
GFR calc non Af Amer: 62 mL/min/{1.73_m2} (ref >59)
Glucose: 90 mg/dL (ref 65–99)
Potassium: 4.1 mmol/L (ref 3.5–5.2)
Sodium: 142 mmol/L (ref 134–144)

## 2015-10-24 LAB — HEPATIC FUNCTION PANEL
ALT: 12 IU/L (ref 0–32)
AST: 21 IU/L (ref 0–40)
Albumin: 3.8 g/dL (ref 3.6–4.8)
Alkaline Phosphatase: 112 IU/L (ref 39–117)
Bilirubin Total: 0.2 mg/dL (ref 0.0–1.2)
Bilirubin, Direct: 0.1 mg/dL (ref 0.00–0.40)
Total Protein: 7.4 g/dL (ref 6.0–8.5)

## 2015-10-24 LAB — TSH: TSH: 11.14 u[IU]/mL — ABNORMAL HIGH (ref 0.450–4.500)

## 2015-10-24 LAB — LIPASE: Lipase: 34 U/L (ref 0–59)

## 2015-10-24 MED ORDER — LEVOTHYROXINE SODIUM 150 MCG PO TABS: ORAL_TABLET | ORAL | Status: DC

## 2015-10-24 MED ORDER — IOHEXOL 300 MG/ML  SOLN
100.0000 mL | Freq: Once | INTRAMUSCULAR | Status: AC | PRN
  Administered 2015-10-24: 100 mL via INTRAVENOUS

## 2015-10-24 MED ORDER — PREDNISONE 20 MG PO TABS
ORAL_TABLET | ORAL | Status: DC
Start: 2015-10-24 — End: 2015-11-14

## 2015-10-25 NOTE — Addendum Note (Signed)
Addended by: Dairl Ponder on: 10/25/2015 11:36 AM   Modules accepted: Orders

## 2015-10-30 ENCOUNTER — Encounter: Payer: Self-pay | Admitting: Cardiology

## 2015-10-30 ENCOUNTER — Telehealth: Payer: Self-pay | Admitting: Family Medicine

## 2015-10-30 ENCOUNTER — Ambulatory Visit (INDEPENDENT_AMBULATORY_CARE_PROVIDER_SITE_OTHER): Payer: Medicare Other | Admitting: Cardiology

## 2015-10-30 VITALS — BP 114/70 | HR 48 | Ht 66.0 in | Wt 227.6 lb

## 2015-10-30 DIAGNOSIS — I2510 Atherosclerotic heart disease of native coronary artery without angina pectoris: Secondary | ICD-10-CM | POA: Diagnosis not present

## 2015-10-30 DIAGNOSIS — E782 Mixed hyperlipidemia: Secondary | ICD-10-CM

## 2015-10-30 DIAGNOSIS — I10 Essential (primary) hypertension: Secondary | ICD-10-CM

## 2015-10-30 DIAGNOSIS — Z01810 Encounter for preprocedural cardiovascular examination: Secondary | ICD-10-CM

## 2015-10-30 DIAGNOSIS — R1030 Lower abdominal pain, unspecified: Secondary | ICD-10-CM

## 2015-10-30 DIAGNOSIS — R001 Bradycardia, unspecified: Secondary | ICD-10-CM

## 2015-10-30 MED ORDER — METOPROLOL TARTRATE 25 MG PO TABS: 25.0000 mg | ORAL_TABLET | Freq: Two times a day (BID) | ORAL | Status: DC

## 2015-10-30 NOTE — Telephone Encounter (Signed)
I believe that this is more than likely musculoskeletal related into the groin I would recommend urgent referral to orthopedics she should be able to get in with one of the orthopedic groups in [place] that come out of [place] or nurses can assist with getting urgent referral to get this addressed. It will be important to send recent office notes lab work and CAT scan results to the specialists as well to show them what we have already done

## 2015-10-30 NOTE — Telephone Encounter (Signed)
Referral put in system urgently. Patient was notified.

## 2015-10-30 NOTE — Telephone Encounter (Signed)
Patient says she isn't doing any better with her groin pain and she wants to know what she needs to do next.  Please advise.  She is seeing a doctor today at 1 about having neck surgery but she says she needs to address the groin pain before she can have neck surgery.

## 2015-10-30 NOTE — Telephone Encounter (Signed)
Appt scheduled for Thursday, November 01, 2015 with Dr. Lorin Mercy at 1:30 pm.  Patient is aware.

## 2015-10-30 NOTE — Patient Instructions (Signed)
Your physician has recommended you make the following change in your medication:  Decrease metoprolol tartrate to 25 mg twice daily. You may break your 50 mg tablet in half twice daily until they are finished. Continue all other medications the same. Your physician recommends that you schedule a follow-up appointment in: 1 year. You will receive a reminder letter in the mail in about 10 months reminding you to call and schedule your appointment. If you don't receive this letter, please contact our office.

## 2015-10-30 NOTE — Progress Notes (Signed)
Cardiology Office Note  Date: 10/30/2015   ID: [name], DOB 09-04-1955, MRN [id number]  PCP: Tracy Lange, MD  Primary Cardiologist: Tracy Lesches, MD   Chief Complaint  Patient presents with  . Coronary Artery Disease  . Preoperative evaluation    History of Present Illness: [name] is a 60 y.o. female last seen in October 2015. She presents for a follow-up visit, also for preoperative evaluation. She is being considered for C3-4 and C4-5 anterior cervical fusion by Dr. Joya Cochran. It has been requested that she stop Plavix for this operation.  From a cardiac perspective, she reports no angina symptoms or nitroglycerin use. She has stable NYHA class II dyspnea with typical activities. She has had functional limitations related to right-sided groin and flank discomfort and also chronic neck pain.  ECG today shows sinus bradycardia in the mid 40s. She has had no dizziness or syncope.  I reviewed her medications. Cardiac regimen includes Plavix, Lipitor, Imdur, as needed nitroglycerin, and Lopressor at 50 mg twice daily.  I reviewed her cardiac catheterization report from 2013.  Past Medical History  Diagnosis Date  . Anxiety   . Depression     Prior suicide attempt  . GERD (gastroesophageal reflux disease)   . Essential hypertension, benign   . Hypothyroidism   . Asthma   . Coronary atherosclerosis of native coronary artery     Nonobstructive circ/RCA, DES LAD/diagonal 2005, LVEF 60%  . Palpitations     PVC's have been noted  . NSTEMI (non-ST elevated myocardial infarction) ([place]) 2005  . Fibromyalgia     Past Surgical History  Procedure Laterality Date  . Tubal ligation    . Foot bone excision      Sesmoid bone left foot-repair of fracture  . Carpal tunnel release  2007 and 2008    Bilateral  . Cardiac catheterization    . Coronary angioplasty      Current Outpatient Prescriptions  Medication Sig Dispense Refill  . atorvastatin (LIPITOR) 80  MG tablet Take 1 tablet (80 mg total) by mouth daily. 90 tablet 0  . clopidogrel (PLAVIX) 75 MG tablet Take 1 tablet (75 mg total) by mouth daily. 30 tablet 6  . FLUoxetine (PROZAC) 20 MG tablet Take 60 mg by mouth daily.    Marland Kitchen gabapentin (NEURONTIN) 100 MG capsule Take 100 mg by mouth daily.    Marland Kitchen HYDROcodone-acetaminophen (NORCO) 10-325 MG tablet Take 1 tablet by mouth 3 (three) times daily as needed. 90 tablet 0  . isosorbide mononitrate (IMDUR) 30 MG 24 hr tablet TAKE (1/2) TABLET BY MOUTH DAILY. 15 tablet 12  . levalbuterol (XOPENEX HFA) 45 MCG/ACT inhaler Inhale 2 puffs into the lungs every 4 (four) hours as needed for wheezing. 1 Inhaler 12  . levothyroxine (SYNTHROID, LEVOTHROID) 150 MCG tablet TAKE 1 TABLET BY MOUTH ONCE DAILY. PATIENT NEEDS TO BE SEEN. 30 tablet 5  . nitroGLYCERIN (NITROSTAT) 0.4 MG SL tablet Place 0.4 mg under the tongue every 5 (five) minutes x 3 doses as needed for chest pain.    . pantoprazole (PROTONIX) 40 MG tablet TAKE 1 TABLET DAILY FOR ACID REFLUX. 30 tablet 6  . predniSONE (DELTASONE) 20 MG tablet 3qd for 3d then 2qd for 3d then 1qd for 3d 18 tablet 0  . metoprolol tartrate (LOPRESSOR) 25 MG tablet Take 1 tablet (25 mg total) by mouth 2 (two) times daily. 180 tablet 3   No current facility-administered medications for this visit.   Allergies:  Albuterol; Cymbalta; and Lyrica   Social History: The patient  reports that she has been smoking Cigarettes.  She has a 12 pack-year smoking history. She has never used smokeless tobacco. She reports that she does not drink alcohol or use illicit drugs.   Family History: The patient's family history includes Coronary artery disease in her father. There is no history of Anesthesia problems, Hypotension, Malignant hyperthermia, or Pseudochol deficiency.   ROS:  Please see the history of present illness. Otherwise, complete review of systems is positive for chronic neck pain.  All other systems are reviewed and negative.    Physical Exam: VS:  BP 114/70 mmHg  Pulse 48  Ht '5\' 6"'$  (1.676 m)  Wt 227 lb 9.6 oz (103.239 kg)  BMI 36.75 kg/m2  SpO2 96%, BMI Body mass index is 36.75 kg/(m^2).  Wt Readings from Last 3 Encounters:  10/30/15 227 lb 9.6 oz (103.239 kg)  10/23/15 228 lb (103.42 kg)  10/11/15 227 lb 2 oz (103.023 kg)    Overweight woman, appears comfortable.  HEENT: Conjunctiva and lids normal, oropharynx clear.  Neck: Supple, no elevated JVP or carotid bruits, no thyromegaly.  Lungs: Clear to auscultation, nonlabored breathing at rest.  Cardiac: Regular rate and rhythm, no S3 or significant systolic murmur, no pericardial rub.  Abdomen: Soft, nontender, bowel sounds present.  Extremities: No pitting edema, distal pulses 2+.  Skin: Warm and dry. Tattoos extending up both arms.  Musculoskeletal: No kyphosis.  Neuropsychiatric: Alert and oriented x3, affect grossly appropriate.  ECG: I personally reviewed the prior tracing from 05/17/2014 which showed sinus bradycardia.  Recent Labwork: 10/23/2015: ALT 12; AST 21; BUN 10; Creatinine, Ser 0.99; Platelets 319; Potassium 4.1; Sodium 142; TSH 11.140*     Component Value Date/Time   CHOL 139 05/03/2015 0825   CHOL 142 11/16/2013 0752   TRIG 117 05/03/2015 0825   HDL 50 05/03/2015 0825   HDL 40 11/16/2013 0752   CHOLHDL 2.8 05/03/2015 0825   CHOLHDL 3.6 11/16/2013 0752   VLDL 24 11/16/2013 0752   LDLCALC 66 05/03/2015 0825   LDLCALC 78 11/16/2013 0752    Other Studies Reviewed Today:  Cardiac catheterization 12/03/2011: Coronary angiography: Coronary dominance: Right   Left Main: Normal in size with 10% distal stenosis at the bifurcation.  Left Anterior Descending (LAD): Medium in size. An overlapped bifurcation proximal LAD/diagonal stents are noted ( crush technique) without significant restenosis. The diagonal system is actually much larger than the LAD itself which continues as mostly 2 septal perforators. The mid and  distal LAD has minor atherosclerosis without obstructive disease.  1st diagonal (D1): Very large in size and supplies most of the LAD distribution. The stent is noted proximally which is patent without significant restenosis. In the midsegment, there is a 40% tubular stenosis.  2nd diagonal (D2): Normal in size without significant disease.  Circumflex (LCx): Normal in size and nondominant. There is a 20% ostial disease. The rest of the vessel has minor irregularities.  1st obtuse marginal: Normal in size with 30% ostial stenosis.  2nd obtuse marginal: Normal in size and free of significant disease.  3rd obtuse marginal: Normal in size and free of significant disease.  Right Coronary Artery: Large in size and dominant. There is diffuse 20% disease proximally. Otherwise no evidence of obstructive disease.  posterior descending artery: Large in size and reaches the apex. It has no significant disease.  posterior lateral branch: No significant disease.  Left ventriculography: Left ventricular systolic function is low normal ,  LVEF is estimated at 50-55 %, there is no significant mitral regurgitation . There is moderate mid to distal anterolateral hypokinesis.  Final Conclusions:  1. Patent LAD/diagonal stents without significant restenosis. Mild CAD without evidence of obstructive disease at this time. 2. Low normal LV systolic function with an estimated ejection fraction of 50-55% with moderate anterolateral hypokinesis. 3. Mildly elevated left ventricular end-diastolic pressure.  Assessment and Plan:  1. Preoperative cardiac evaluation pending cervical fusion as outlined above. She is symptomatically stable without active angina and stable NYHA class II dyspnea on medical therapy. Cardiac catheterization from 2013 is outlined above, and she has had no clinical decline since that point.  She should be able to proceed at an acceptable perioperative cardiac risk. Would hold Plavix  for at least 7 days prior to the operation.  2. CAD status post DES to the LAD/diagonal in 2005, patent at angiography in 2013. Plan is to continue medical therapy and observation.  3. Essential hypertension, blood pressure is normal today.  4. Bradycardia with heart rate in the 40s. Will reduce Lopressor to 25 mg twice daily for now.  5. Hyperlipidemia, on statin therapy. Recent LDL 66.  Current medicines were reviewed with the patient today.   Orders Placed This Encounter  Procedures  . EKG 12-Lead    Disposition: FU with me in 1 year.   Signed, Satira Sark, MD, [facility] 10/30/2015 1:27 PM    [facility] [place], [place], [place] [address] Phone: [phone]; Fax: [phone]

## 2015-11-01 DIAGNOSIS — M5137 Other intervertebral disc degeneration, lumbosacral region: Secondary | ICD-10-CM | POA: Diagnosis not present

## 2015-11-01 DIAGNOSIS — M25551 Pain in right hip: Secondary | ICD-10-CM | POA: Diagnosis not present

## 2015-11-02 DIAGNOSIS — M4696 Unspecified inflammatory spondylopathy, lumbar region: Secondary | ICD-10-CM | POA: Diagnosis not present

## 2015-11-02 DIAGNOSIS — M5126 Other intervertebral disc displacement, lumbar region: Secondary | ICD-10-CM | POA: Diagnosis not present

## 2015-11-02 DIAGNOSIS — M5136 Other intervertebral disc degeneration, lumbar region: Secondary | ICD-10-CM | POA: Diagnosis not present

## 2015-11-05 ENCOUNTER — Other Ambulatory Visit: Payer: Self-pay | Admitting: Neurosurgery

## 2015-11-08 DIAGNOSIS — M5137 Other intervertebral disc degeneration, lumbosacral region: Secondary | ICD-10-CM | POA: Diagnosis not present

## 2015-11-16 ENCOUNTER — Encounter (HOSPITAL_COMMUNITY)
Admission: RE | Admit: 2015-11-16 | Discharge: 2015-11-16 | Disposition: A | Discharge status: Home / Self Care | Payer: Medicare Other | Source: Ambulatory Visit | Attending: Neurosurgery | Admitting: Neurosurgery

## 2015-11-16 ENCOUNTER — Encounter (HOSPITAL_COMMUNITY): Payer: Self-pay

## 2015-11-16 DIAGNOSIS — Z01812 Encounter for preprocedural laboratory examination: Secondary | ICD-10-CM | POA: Insufficient documentation

## 2015-11-16 HISTORY — DX: Unspecified osteoarthritis, unspecified site: M19.90

## 2015-11-16 HISTORY — DX: Sleep apnea, unspecified: G47.30

## 2015-11-16 LAB — CBC
HCT: 39.3 % (ref 36.0–46.0)
Hemoglobin: 12.7 g/dL (ref 12.0–15.0)
MCH: 30.3 pg (ref 26.0–34.0)
MCHC: 32.3 g/dL (ref 30.0–36.0)
MCV: 93.8 fL (ref 78.0–100.0)
Platelets: 271 10*3/uL (ref 150–400)
RBC: 4.19 MIL/uL (ref 3.87–5.11)
RDW: 14.4 % (ref 11.5–15.5)
WBC: 8.9 10*3/uL (ref 4.0–10.5)

## 2015-11-16 LAB — BASIC METABOLIC PANEL
Anion gap: 9 (ref 5–15)
BUN: 7 mg/dL (ref 6–20)
CO2: 27 mmol/L (ref 22–32)
Calcium: 9.7 mg/dL (ref 8.9–10.3)
Chloride: 106 mmol/L (ref 101–111)
Creatinine, Ser: 0.9 mg/dL (ref 0.44–1.00)
GFR calc Af Amer: >60 mL/min (ref >60)
GFR calc non Af Amer: >60 mL/min (ref >60)
Glucose, Bld: 108 mg/dL — ABNORMAL HIGH (ref 65–99)
Potassium: 4.5 mmol/L (ref 3.5–5.1)
Sodium: 142 mmol/L (ref 135–145)

## 2015-11-16 LAB — SURGICAL PCR SCREEN
MRSA, PCR: NEGATIVE
Staphylococcus aureus: NEGATIVE

## 2015-11-16 NOTE — Progress Notes (Signed)
Patient has h/o heart issues.  Had heart cath 12/2011 with angioplasty. Denies any current issues.  Sees Dr. Myles Gip every 6 mths.  LOV was just this month 11/2015 for clearance w/u for this surgery. (note in chart) PCP is Dr. Sallee Lange  LOV 09/2015 Has already stopped her Plavix on 11/07/2015

## 2015-11-16 NOTE — Pre-Procedure Instructions (Signed)
ISSABELLA RIX  11/16/2015      [facility] - [place], [place] [address] Phone: [phone] Fax: [phone]    Your procedure is scheduled on Wednesday, April 19th .  Report to [facility] Admitting at 8:00 AM            (Arrival time per your surgeon's request)   Call this number if you have problems the morning of surgery:  [phone]   Remember:  Do not eat food or drink liquids after midnight Tuesday.   Take these medicines the morning of surgery with A SIP OF WATER : Prozac, Levothyroxine, Metoprolol, Protonix.   Please use your inhaler the morning of surgery.             (4-5 days prior to surgery, STOP taking any herbal supplements, Vitamins, Blood thinners, Anti-inflammatories.)   Do not wear jewelry, make-up or nail polish.  Do not wear lotions, powders, or perfumes.  You may NOT wear deodorant the morning of surgery.   Do not shave underarms & legs 48 hours prior to surgery.    Do not bring valuables to the hospital.  [facility] is not responsible for any belongings or valuables.  Contacts, dentures or bridgework may not be worn into surgery.  Leave your suitcase in the car.  After surgery it may be brought to your room. For patients admitted to the hospital, discharge time will be determined by your treatment team.  Name and phone number of your driver:     Please read over the following fact sheets that you were given. Pain Booklet, Coughing and Deep Breathing, MRSA Information and Surgical Site Infection Prevention

## 2015-11-20 ENCOUNTER — Encounter (HOSPITAL_COMMUNITY): Payer: Self-pay

## 2015-11-20 ENCOUNTER — Other Ambulatory Visit: Payer: Self-pay

## 2015-11-20 ENCOUNTER — Inpatient Hospital Stay (HOSPITAL_COMMUNITY)
Admission: EM | Admit: 2015-11-20 | Discharge: 2015-11-22 | DRG: 281 | Disposition: A | Discharge status: Home / Self Care | Payer: Medicare Other | Attending: Cardiology | Admitting: Cardiology

## 2015-11-20 ENCOUNTER — Emergency Department (HOSPITAL_COMMUNITY): Payer: Medicare Other

## 2015-11-20 DIAGNOSIS — E039 Hypothyroidism, unspecified: Secondary | ICD-10-CM | POA: Diagnosis present

## 2015-11-20 DIAGNOSIS — K760 Fatty (change of) liver, not elsewhere classified: Secondary | ICD-10-CM | POA: Diagnosis not present

## 2015-11-20 DIAGNOSIS — R778 Other specified abnormalities of plasma proteins: Secondary | ICD-10-CM | POA: Insufficient documentation

## 2015-11-20 DIAGNOSIS — I214 Non-ST elevation (NSTEMI) myocardial infarction: Secondary | ICD-10-CM | POA: Diagnosis not present

## 2015-11-20 DIAGNOSIS — R079 Chest pain, unspecified: Secondary | ICD-10-CM | POA: Diagnosis not present

## 2015-11-20 DIAGNOSIS — R0682 Tachypnea, not elsewhere classified: Secondary | ICD-10-CM | POA: Diagnosis not present

## 2015-11-20 DIAGNOSIS — I252 Old myocardial infarction: Secondary | ICD-10-CM

## 2015-11-20 DIAGNOSIS — Z888 Allergy status to other drugs, medicaments and biological substances status: Secondary | ICD-10-CM

## 2015-11-20 DIAGNOSIS — I2510 Atherosclerotic heart disease of native coronary artery without angina pectoris: Secondary | ICD-10-CM | POA: Diagnosis not present

## 2015-11-20 DIAGNOSIS — F419 Anxiety disorder, unspecified: Secondary | ICD-10-CM | POA: Diagnosis present

## 2015-11-20 DIAGNOSIS — I119 Hypertensive heart disease without heart failure: Secondary | ICD-10-CM | POA: Diagnosis not present

## 2015-11-20 DIAGNOSIS — F17210 Nicotine dependence, cigarettes, uncomplicated: Secondary | ICD-10-CM | POA: Diagnosis present

## 2015-11-20 DIAGNOSIS — F418 Other specified anxiety disorders: Secondary | ICD-10-CM | POA: Diagnosis not present

## 2015-11-20 DIAGNOSIS — Z79899 Other long term (current) drug therapy: Secondary | ICD-10-CM

## 2015-11-20 DIAGNOSIS — J45909 Unspecified asthma, uncomplicated: Secondary | ICD-10-CM | POA: Diagnosis present

## 2015-11-20 DIAGNOSIS — R001 Bradycardia, unspecified: Secondary | ICD-10-CM | POA: Diagnosis not present

## 2015-11-20 DIAGNOSIS — Z955 Presence of coronary angioplasty implant and graft: Secondary | ICD-10-CM

## 2015-11-20 DIAGNOSIS — Z6836 Body mass index (BMI) 36.0-36.9, adult: Secondary | ICD-10-CM

## 2015-11-20 DIAGNOSIS — Z720 Tobacco use: Secondary | ICD-10-CM | POA: Insufficient documentation

## 2015-11-20 DIAGNOSIS — K219 Gastro-esophageal reflux disease without esophagitis: Secondary | ICD-10-CM | POA: Diagnosis present

## 2015-11-20 DIAGNOSIS — G959 Disease of spinal cord, unspecified: Secondary | ICD-10-CM | POA: Diagnosis not present

## 2015-11-20 DIAGNOSIS — F329 Major depressive disorder, single episode, unspecified: Secondary | ICD-10-CM | POA: Diagnosis present

## 2015-11-20 DIAGNOSIS — R7989 Other specified abnormal findings of blood chemistry: Secondary | ICD-10-CM

## 2015-11-20 DIAGNOSIS — E782 Mixed hyperlipidemia: Secondary | ICD-10-CM | POA: Diagnosis present

## 2015-11-20 DIAGNOSIS — F32A Depression, unspecified: Secondary | ICD-10-CM | POA: Diagnosis present

## 2015-11-20 DIAGNOSIS — M797 Fibromyalgia: Secondary | ICD-10-CM | POA: Diagnosis present

## 2015-11-20 DIAGNOSIS — I10 Essential (primary) hypertension: Secondary | ICD-10-CM | POA: Diagnosis present

## 2015-11-20 LAB — CBC
HCT: 35.5 % — ABNORMAL LOW (ref 36.0–46.0)
Hemoglobin: 11.7 g/dL — ABNORMAL LOW (ref 12.0–15.0)
MCH: 31.2 pg (ref 26.0–34.0)
MCHC: 33 g/dL (ref 30.0–36.0)
MCV: 94.7 fL (ref 78.0–100.0)
Platelets: 308 10*3/uL (ref 150–400)
RBC: 3.75 MIL/uL — ABNORMAL LOW (ref 3.87–5.11)
RDW: 14.2 % (ref 11.5–15.5)
WBC: 9.6 10*3/uL (ref 4.0–10.5)

## 2015-11-20 LAB — COMPREHENSIVE METABOLIC PANEL
ALT: 17 U/L (ref 14–54)
AST: 19 U/L (ref 15–41)
Albumin: 3.1 g/dL — ABNORMAL LOW (ref 3.5–5.0)
Alkaline Phosphatase: 86 U/L (ref 38–126)
Anion gap: 8 (ref 5–15)
BUN: 11 mg/dL (ref 6–20)
CO2: 27 mmol/L (ref 22–32)
Calcium: 8.6 mg/dL — ABNORMAL LOW (ref 8.9–10.3)
Chloride: 106 mmol/L (ref 101–111)
Creatinine, Ser: 0.93 mg/dL (ref 0.44–1.00)
GFR calc Af Amer: >60 mL/min (ref >60)
GFR calc non Af Amer: >60 mL/min (ref >60)
Glucose, Bld: 149 mg/dL — ABNORMAL HIGH (ref 65–99)
Potassium: 4 mmol/L (ref 3.5–5.1)
Sodium: 141 mmol/L (ref 135–145)
Total Bilirubin: 0.2 mg/dL — ABNORMAL LOW (ref 0.3–1.2)
Total Protein: 6.8 g/dL (ref 6.5–8.1)

## 2015-11-20 LAB — PROTIME-INR
INR: 1.06 (ref 0.00–1.49)
Prothrombin Time: 14 seconds (ref 11.6–15.2)

## 2015-11-20 LAB — TROPONIN I
Troponin I: 0.04 ng/mL — ABNORMAL HIGH (ref <0.031)
Troponin I: 0.24 ng/mL — ABNORMAL HIGH (ref <0.031)

## 2015-11-20 LAB — APTT: aPTT: 26 seconds (ref 24–37)

## 2015-11-20 MED ORDER — ASPIRIN 81 MG PO CHEW
324.0000 mg | CHEWABLE_TABLET | Freq: Once | ORAL | Status: DC
  Filled 2015-11-20 (×2): qty 4

## 2015-11-20 MED ORDER — FLUOXETINE HCL 20 MG PO TABS
60.0000 mg | ORAL_TABLET | Freq: Every day | ORAL | Status: DC
  Filled 2015-11-20 (×2): qty 3

## 2015-11-20 MED ORDER — ONDANSETRON HCL 4 MG/2ML IJ SOLN: 4.0000 mg | Freq: Four times a day (QID) | INTRAMUSCULAR | Status: DC | PRN

## 2015-11-20 MED ORDER — ATORVASTATIN CALCIUM 80 MG PO TABS
80.0000 mg | ORAL_TABLET | Freq: Every day | ORAL | Status: DC
  Administered 2015-11-21: 19:00:00 80 mg via ORAL
  Filled 2015-11-20: qty 1

## 2015-11-20 MED ORDER — GI COCKTAIL ~~LOC~~: 30.0000 mL | Freq: Four times a day (QID) | ORAL | Status: DC | PRN

## 2015-11-20 MED ORDER — NITROGLYCERIN 0.4 MG SL SUBL: 0.4000 mg | SUBLINGUAL_TABLET | SUBLINGUAL | Status: DC | PRN

## 2015-11-20 MED ORDER — METOPROLOL TARTRATE 25 MG PO TABS
25.0000 mg | ORAL_TABLET | Freq: Two times a day (BID) | ORAL | Status: DC
  Administered 2015-11-20 – 2015-11-22 (×4): 25 mg via ORAL
  Filled 2015-11-20 (×4): qty 1

## 2015-11-20 MED ORDER — MORPHINE SULFATE (PF) 2 MG/ML IV SOLN: 2.0000 mg | INTRAVENOUS | Status: DC | PRN

## 2015-11-20 MED ORDER — LEVALBUTEROL HCL 0.63 MG/3ML IN NEBU: 0.6300 mg | INHALATION_SOLUTION | Freq: Four times a day (QID) | RESPIRATORY_TRACT | Status: DC | PRN

## 2015-11-20 MED ORDER — ASPIRIN EC 325 MG PO TBEC
325.0000 mg | DELAYED_RELEASE_TABLET | Freq: Every day | ORAL | Status: DC
  Administered 2015-11-20 – 2015-11-22 (×3): 325 mg via ORAL
  Filled 2015-11-20 (×3): qty 1

## 2015-11-20 MED ORDER — LEVALBUTEROL TARTRATE 45 MCG/ACT IN AERO: 2.0000 | INHALATION_SPRAY | RESPIRATORY_TRACT | Status: DC | PRN

## 2015-11-20 MED ORDER — ACETAMINOPHEN 325 MG PO TABS: 650.0000 mg | ORAL_TABLET | ORAL | Status: DC | PRN

## 2015-11-20 MED ORDER — CLOPIDOGREL BISULFATE 75 MG PO TABS
75.0000 mg | ORAL_TABLET | Freq: Every day | ORAL | Status: DC
  Administered 2015-11-21 – 2015-11-22 (×2): 75 mg via ORAL
  Filled 2015-11-20 (×3): qty 1

## 2015-11-20 MED ORDER — NITROGLYCERIN 0.4 MG SL SUBL
0.4000 mg | SUBLINGUAL_TABLET | SUBLINGUAL | Status: DC | PRN
  Administered 2015-11-20: 0.4 mg via SUBLINGUAL
  Filled 2015-11-20: qty 1

## 2015-11-20 MED ORDER — CEFAZOLIN SODIUM-DEXTROSE 2-4 GM/100ML-% IV SOLN: 2.0000 g | INTRAVENOUS | Status: DC

## 2015-11-20 NOTE — ED Provider Notes (Signed)
CSN: [id number]     Arrival date & time 11/20/15  1736 History   First MD Initiated Contact with Patient 11/20/15 1752     Chief Complaint  Patient presents with  . Chest Pain     (Consider location/radiation/quality/duration/timing/severity/associated sxs/prior Treatment) HPI Comments: The patient is a 60 year old female, the patient states that this morning she was having some chest discomfort after she was chasing her dog up a hill, this was located in the upper left chest and had radiated into her neck, she initially had improvement of the pain and that she stayed home throughout the day but has had some waxing and waning symptoms since that time. It is not necessarily positional or exertional since it hasn't onset but is currently present, mild to moderate, left-sided and feels like an aching. She reports that it is similar to prior heart problems and also reports that she has been off of her Plavix for 10 days anticipating surgery tomorrow for bone spurs in her neck. She denies nausea vomiting or shortness of breath at this time. She was given nitroglycerin by EMS, she took aspirin prior to arrival 324 mg  Patient is a 60 y.o. female presenting with chest pain. The history is provided by the patient and the EMS personnel.  Chest Pain   Past Medical History  Diagnosis Date  . Anxiety   . Depression     Prior suicide attempt  . GERD (gastroesophageal reflux disease)   . Hypothyroidism   . Asthma   . Coronary atherosclerosis of native coronary artery     Nonobstructive circ/RCA, DES LAD/diagonal 2005, LVEF 60%  . Palpitations     PVC's have been noted  . NSTEMI (non-ST elevated myocardial infarction) ([place]) 2005  . Fibromyalgia   . Essential hypertension, benign   . Sleep apnea   . Arthritis    Past Surgical History  Procedure Laterality Date  . Tubal ligation    . Foot bone excision      Sesmoid bone left foot-repair of fracture  . Carpal tunnel release  2007 and 2008   Bilateral  . Cardiac catheterization    . Coronary angioplasty     Family History  Problem Relation Age of Onset  . Anesthesia problems Neg Hx   . Hypotension Neg Hx   . Malignant hyperthermia Neg Hx   . Pseudochol deficiency Neg Hx   . Coronary artery disease Father     MI age 68   Social History  Substance Use Topics  . Smoking status: Current Every Day Smoker -- 0.30 packs/day for 40 years    Types: Cigarettes    Last Attempt to Quit: 08/16/2013  . Smokeless tobacco: Never Used     Comment: 2 ciggs per day  . Alcohol Use: No   OB History    No data available     Review of Systems  Cardiovascular: Positive for chest pain.  All other systems reviewed and are negative.     Allergies  Albuterol; Cymbalta; and Lyrica  Home Medications   Prior to Admission medications   Medication Sig Start Date End Date Taking? Authorizing Provider  atorvastatin (LIPITOR) 80 MG tablet Take 1 tablet (80 mg total) by mouth daily. 10/08/15  Yes Kathyrn Drown, MD  FLUoxetine (PROZAC) 20 MG tablet Take 60 mg by mouth daily.   Yes Historical Provider, MD  isosorbide mononitrate (IMDUR) 30 MG 24 hr tablet TAKE (1/2) TABLET BY MOUTH DAILY. Patient taking differently: Take 15 mg by  mouth daily.  06/13/15  Yes Kathyrn Drown, MD  levothyroxine (SYNTHROID, LEVOTHROID) 150 MCG tablet TAKE 1 TABLET BY MOUTH ONCE DAILY. PATIENT NEEDS TO BE SEEN. Patient taking differently: Take 150 mcg by mouth daily before breakfast.  10/24/15  Yes Kathyrn Drown, MD  metoprolol tartrate (LOPRESSOR) 25 MG tablet Take 1 tablet (25 mg total) by mouth 2 (two) times daily. 10/30/15  Yes Satira Sark, MD  nitroGLYCERIN (NITROSTAT) 0.4 MG SL tablet Place 0.4 mg under the tongue every 5 (five) minutes x 3 doses as needed for chest pain. 11/09/11  Yes Rhonda G Barrett, PA-C  pantoprazole (PROTONIX) 40 MG tablet TAKE 1 TABLET DAILY FOR ACID REFLUX. Patient taking differently: Take 40 mg by mouth daily.  06/13/15  Yes Kathyrn Drown, MD  clopidogrel (PLAVIX) 75 MG tablet Take 1 tablet (75 mg total) by mouth daily. 06/13/15   Kathyrn Drown, MD  levalbuterol [facility] HFA) 45 MCG/ACT inhaler Inhale 2 puffs into the lungs every 4 (four) hours as needed for wheezing. 06/13/15   Kathyrn Drown, MD   BP 124/62 mmHg  Pulse 61  Temp(Src) 98.3 F (36.8 C) (Oral)  Resp 18  Ht '5\' 6"'$  (1.676 m)  Wt 223 lb 6.4 oz (101.334 kg)  BMI 36.08 kg/m2  SpO2 96% Physical Exam  Constitutional: She appears well-developed and well-nourished. No distress.  HENT:  Head: Normocephalic and atraumatic.  Mouth/Throat: Oropharynx is clear and moist. No oropharyngeal exudate.  Eyes: Conjunctivae and EOM are normal. Pupils are equal, round, and reactive to light. Right eye exhibits no discharge. Left eye exhibits no discharge. No scleral icterus.  Neck: Normal range of motion. Neck supple. No JVD present. No thyromegaly present.  Cardiovascular: Regular rhythm, normal heart sounds and intact distal pulses.  Exam reveals no gallop and no friction rub.   No murmur heard. bradycardia  Pulmonary/Chest: Effort normal and breath sounds normal. No respiratory distress. She has no wheezes. She has no rales.  Abdominal: Soft. Bowel sounds are normal. She exhibits no distension and no mass. There is no tenderness.  Musculoskeletal: Normal range of motion. She exhibits no edema or tenderness.  Lymphadenopathy:    She has no cervical adenopathy.  Neurological: She is alert. Coordination normal.  Skin: Skin is warm and dry. No rash noted. No erythema.  Psychiatric: She has a normal mood and affect. Her behavior is normal.  Nursing note and vitals reviewed.   ED Course  Procedures (including critical care time) Labs Review Labs Reviewed  CBC - Abnormal; Notable for the following:    RBC 3.75 (*)    Hemoglobin 11.7 (*)    HCT 35.5 (*)    All other components within normal limits  COMPREHENSIVE METABOLIC PANEL - Abnormal; Notable for the following:     Glucose, Bld 149 (*)    Calcium 8.6 (*)    Albumin 3.1 (*)    Total Bilirubin 0.2 (*)    All other components within normal limits  TROPONIN I - Abnormal; Notable for the following:    Troponin I 0.04 (*)    All other components within normal limits  TROPONIN I - Abnormal; Notable for the following:    Troponin I 0.24 (*)    All other components within normal limits  TROPONIN I - Abnormal; Notable for the following:    Troponin I 0.62 (*)    All other components within normal limits  TROPONIN I - Abnormal; Notable for the following:  Troponin I 0.93 (*)    All other components within normal limits  APTT  PROTIME-INR  HEPARIN LEVEL (UNFRACTIONATED)    Imaging Review Dg Chest Portable 1 View  11/20/2015  CLINICAL DATA:  Encasing dog up a hill and started having chest pain earlier today. EXAM: PORTABLE CHEST 1 VIEW COMPARISON:  11/08/2011. FINDINGS: 1807 hours. Lung volumes are low. Interstitial markings are diffusely coarsened with chronic features. There is pulmonary vascular congestion without overt pulmonary edema. Cardiopericardial silhouette is at upper limits of normal for size. The visualized bony structures of the thorax are intact. Telemetry leads overlie the chest. IMPRESSION: Low volume film with vascular congestion. Electronically Signed   By: Misty Stanley M.D.   On: 11/20/2015 18:20   I have personally reviewed and evaluated these images and lab results as part of my medical decision-making.   EKG Interpretation   Date/Time:  Tuesday November 20 2015 17:44:19 EDT Ventricular Rate:  50 PR Interval:  172 QRS Duration: 107 QT Interval:  464 QTC Calculation: 423 R Axis:   24 Text Interpretation:  Sinus bradycardia Since last tracing rate slower  Abnormal ekg Confirmed by Meleena Munroe  MD, Emilee Market ([id number]) on 11/20/2015 6:10:06  PM      MDM   Final diagnoses:  NSTEMI (non-ST elevated myocardial infarction) ([place])   The pt has rf for ACS Known hx of ACS Off plavix for  last 10 days Needs ACS w/u.  Labs without acute findings - the pt has had an elevated troponin and was admitted to the hospital - the pt is acutely will with what could be an acute NSTEMI.  CRITICAL CARE Performed by: Johnna Acosta Total critical care time: 35 minutes Critical care time was exclusive of separately billable procedures and treating other patients. Critical care was necessary to treat or prevent imminent or life-threatening deterioration. Critical care was time spent personally by me on the following activities: development of treatment plan with patient and/or surrogate as well as nursing, discussions with consultants, evaluation of patient's response to treatment, examination of patient, obtaining history from patient or surrogate, ordering and performing treatments and interventions, ordering and review of laboratory studies, ordering and review of radiographic studies, pulse oximetry and re-evaluation of patient's condition.   Noemi Chapel, MD 11/21/15 [phone]

## 2015-11-20 NOTE — Progress Notes (Signed)
Patients second troponin came back elevated at 0.24.  The on call MD notified.  No new orders at this time. Patient currently resting comfortably in her room.  Will continue to monitor.

## 2015-11-20 NOTE — ED Notes (Signed)
Pt states she was chasing her dog up a hill and afterward she started having chest pain. Indicated upper chest near neck. Pt was given NTG x 1 by EMS prior to arrival

## 2015-11-20 NOTE — H&P (Signed)
PCP:   Sallee Lange, MD   Chief Complaint:  Chest pain  HPI: 60 yo female h/o stent LAD in 2005, repeat cath in 2013 showing patent vessels (see full report in epic) who is scheduled to have cervical surgery tomorrow.  Pt was evaluated by her cardiologist preoperatively over a week ago.  Pt has no h/o anginal symptims.  She has been off her plavix for a week now in preparation for her surgery tomorrow.  Today pt was chasing her dog, and after she started experiencing some sub sternal chest "pressure" that radiated into her left jaw.  This pressure waxed/waned for several hours even with rest.  She came to the ED via EMS.  Was given ntg sublingual and aspirin which relieved her pressure.  Has had no discomfort since.  No recent illnesses.  She has ntg at home but it is expired and she cant remember the last time she used it.  No le edema or swelling.  No cough.  Pt referred for admission for possible ACS.  Review of Systems:  Positive and negative as per HPI otherwise all other systems are negative  Past Medical History: Past Medical History  Diagnosis Date  . Anxiety   . Depression     Prior suicide attempt  . GERD (gastroesophageal reflux disease)   . Hypothyroidism   . Asthma   . Coronary atherosclerosis of native coronary artery     Nonobstructive circ/RCA, DES LAD/diagonal 2005, LVEF 60%  . Palpitations     PVC's have been noted  . NSTEMI (non-ST elevated myocardial infarction) ([place]) 2005  . Fibromyalgia   . Essential hypertension, benign     SHE DENIES HAVING THIS   . Sleep apnea     LAST TEST WAS OVER 10 YRS AGO.  (NEGATIVE)  . Arthritis    Past Surgical History  Procedure Laterality Date  . Tubal ligation    . Foot bone excision      Sesmoid bone left foot-repair of fracture  . Carpal tunnel release  2007 and 2008    Bilateral  . Cardiac catheterization    . Coronary angioplasty      Medications: Prior to Admission medications   Medication Sig Start Date End  Date Taking? Authorizing Provider  atorvastatin (LIPITOR) 80 MG tablet Take 1 tablet (80 mg total) by mouth daily. 10/08/15  Yes Kathyrn Drown, MD  FLUoxetine (PROZAC) 20 MG tablet Take 60 mg by mouth daily.   Yes Historical Provider, MD  isosorbide mononitrate (IMDUR) 30 MG 24 hr tablet TAKE (1/2) TABLET BY MOUTH DAILY. Patient taking differently: Take 15 mg by mouth daily.  06/13/15  Yes Kathyrn Drown, MD  levothyroxine (SYNTHROID, LEVOTHROID) 150 MCG tablet TAKE 1 TABLET BY MOUTH ONCE DAILY. PATIENT NEEDS TO BE SEEN. Patient taking differently: Take 150 mcg by mouth daily before breakfast.  10/24/15  Yes Kathyrn Drown, MD  metoprolol tartrate (LOPRESSOR) 25 MG tablet Take 1 tablet (25 mg total) by mouth 2 (two) times daily. 10/30/15  Yes Satira Sark, MD  nitroGLYCERIN (NITROSTAT) 0.4 MG SL tablet Place 0.4 mg under the tongue every 5 (five) minutes x 3 doses as needed for chest pain. 11/09/11  Yes Rhonda G Barrett, PA-C  pantoprazole (PROTONIX) 40 MG tablet TAKE 1 TABLET DAILY FOR ACID REFLUX. Patient taking differently: Take 40 mg by mouth daily.  06/13/15  Yes Kathyrn Drown, MD  clopidogrel (PLAVIX) 75 MG tablet Take 1 tablet (75 mg total) by mouth  daily. 06/13/15   Kathyrn Drown, MD  levalbuterol [facility] HFA) 45 MCG/ACT inhaler Inhale 2 puffs into the lungs every 4 (four) hours as needed for wheezing. 06/13/15   Kathyrn Drown, MD    Allergies:   Allergies  Allergen Reactions  . Albuterol Other (See Comments)    Tremors  . Cymbalta [Duloxetine Hcl]     Felt bad  . Lyrica [Pregabalin]     Retained fluids    Social History:  reports that she has been smoking Cigarettes.  She has a 12 pack-year smoking history. She has never used smokeless tobacco. She reports that she does not drink alcohol or use illicit drugs.  Family History: Family History  Problem Relation Age of Onset  . Anesthesia problems Neg Hx   . Hypotension Neg Hx   . Malignant hyperthermia Neg Hx   . Pseudochol  deficiency Neg Hx   . Coronary artery disease Father     MI age 44    Physical Exam: Filed Vitals:   11/20/15 1830 11/20/15 1845 11/20/15 1901 11/20/15 1920  BP: 112/60 112/60 105/64 105/64  Pulse: 49 55 51 51  Temp:      TempSrc:      Resp: '11 13 18 12  '$ Height:      Weight:      SpO2: 98% 99% 99% 96%   General appearance: alert, cooperative and no distress Head: Normocephalic, without obvious abnormality, atraumatic Eyes: negative Nose: Nares normal. Septum midline. Mucosa normal. No drainage or sinus tenderness. Neck: no JVD and supple, symmetrical, trachea midline Lungs: clear to auscultation bilaterally Heart: regular rate and rhythm, S1, S2 normal, no murmur, click, rub or gallop Abdomen: soft, non-tender; bowel sounds normal; no masses,  no organomegaly Extremities: extremities normal, atraumatic, no cyanosis or edema Pulses: 2+ and symmetric Skin: Skin color, texture, turgor normal. No rashes or lesions Neurologic: Grossly normal    Labs on Admission:   Recent Labs  11/20/15 1829  NA 141  K 4.0  CL 106  CO2 27  GLUCOSE 149*  BUN 11  CREATININE 0.93  CALCIUM 8.6*    Recent Labs  11/20/15 1829  AST 19  ALT 17  ALKPHOS 86  BILITOT 0.2*  PROT 6.8  ALBUMIN 3.1*    Recent Labs  11/20/15 1829  WBC 9.6  HGB 11.7*  HCT 35.5*  MCV 94.7  PLT 308    Recent Labs  11/20/15 1829  TROPONINI 0.04*    Radiological Exams on Admission: Dg Chest Portable 1 View  11/20/2015  CLINICAL DATA:  Encasing dog up a hill and started having chest pain earlier today. EXAM: PORTABLE CHEST 1 VIEW COMPARISON:  11/08/2011. FINDINGS: 1807 hours. Lung volumes are low. Interstitial markings are diffusely coarsened with chronic features. There is pulmonary vascular congestion without overt pulmonary edema. Cardiopericardial silhouette is at upper limits of normal for size. The visualized bony structures of the thorax are intact. Telemetry leads overlie the chest.  IMPRESSION: Low volume film with vascular congestion. Electronically Signed   By: Misty Stanley M.D.   On: 11/20/2015 18:20   ekg reviewed sinus brady no acute issues Old chart reviewed  Assessment/Plan  60 yo female with multiple coronary risk factors with chest pain  Principal Problem:   Chest pain on exertion-  obs on tele.  Romi.  Will obtain cardiology consult in am to evaluate need for stress testing vs cath.  Place back on aspirin /plavix.  Cont home meds.  Place ntg paste  if pressure returns.  Active Problems:  Stable unless o/w noted   Mixed hyperlipidemia   Essential hypertension, benign   CORONARY ATHEROSCLEROSIS NATIVE CORONARY ARTERY   Fibromyalgia   Anxiety and depression   GERD (gastroesophageal reflux disease)   Fatty liver disease, nonalcoholic   Morbid obesity ([place])   Cervical myelopathy ([place])- surgery tomorrow will need to be cancelled likely   obs on tele.  Full code. Mihir Flanigan A 11/20/2015, 8:11 PM

## 2015-11-21 ENCOUNTER — Encounter (HOSPITAL_COMMUNITY)
Admission: EM | Disposition: A | Discharge status: Home / Self Care | Payer: Self-pay | Source: Home / Self Care | Attending: Cardiology

## 2015-11-21 ENCOUNTER — Inpatient Hospital Stay (HOSPITAL_COMMUNITY): Admission: RE | Admit: 2015-11-21 | Payer: Medicare Other | Source: Ambulatory Visit | Admitting: Neurosurgery

## 2015-11-21 ENCOUNTER — Encounter (HOSPITAL_COMMUNITY): Payer: Self-pay | Admitting: Cardiology

## 2015-11-21 DIAGNOSIS — I252 Old myocardial infarction: Secondary | ICD-10-CM | POA: Diagnosis not present

## 2015-11-21 DIAGNOSIS — G959 Disease of spinal cord, unspecified: Secondary | ICD-10-CM | POA: Diagnosis not present

## 2015-11-21 DIAGNOSIS — F419 Anxiety disorder, unspecified: Secondary | ICD-10-CM | POA: Diagnosis present

## 2015-11-21 DIAGNOSIS — Z6836 Body mass index (BMI) 36.0-36.9, adult: Secondary | ICD-10-CM | POA: Diagnosis not present

## 2015-11-21 DIAGNOSIS — R7989 Other specified abnormal findings of blood chemistry: Secondary | ICD-10-CM

## 2015-11-21 DIAGNOSIS — K219 Gastro-esophageal reflux disease without esophagitis: Secondary | ICD-10-CM | POA: Diagnosis present

## 2015-11-21 DIAGNOSIS — I214 Non-ST elevation (NSTEMI) myocardial infarction: Secondary | ICD-10-CM | POA: Diagnosis not present

## 2015-11-21 DIAGNOSIS — F418 Other specified anxiety disorders: Secondary | ICD-10-CM

## 2015-11-21 DIAGNOSIS — Z888 Allergy status to other drugs, medicaments and biological substances status: Secondary | ICD-10-CM | POA: Diagnosis not present

## 2015-11-21 DIAGNOSIS — E039 Hypothyroidism, unspecified: Secondary | ICD-10-CM | POA: Diagnosis present

## 2015-11-21 DIAGNOSIS — R079 Chest pain, unspecified: Secondary | ICD-10-CM | POA: Diagnosis not present

## 2015-11-21 DIAGNOSIS — I25119 Atherosclerotic heart disease of native coronary artery with unspecified angina pectoris: Secondary | ICD-10-CM | POA: Diagnosis not present

## 2015-11-21 DIAGNOSIS — I119 Hypertensive heart disease without heart failure: Secondary | ICD-10-CM | POA: Diagnosis not present

## 2015-11-21 DIAGNOSIS — I25110 Atherosclerotic heart disease of native coronary artery with unstable angina pectoris: Secondary | ICD-10-CM

## 2015-11-21 DIAGNOSIS — F17210 Nicotine dependence, cigarettes, uncomplicated: Secondary | ICD-10-CM | POA: Diagnosis present

## 2015-11-21 DIAGNOSIS — Z79899 Other long term (current) drug therapy: Secondary | ICD-10-CM | POA: Diagnosis not present

## 2015-11-21 DIAGNOSIS — R778 Other specified abnormalities of plasma proteins: Secondary | ICD-10-CM | POA: Insufficient documentation

## 2015-11-21 DIAGNOSIS — E782 Mixed hyperlipidemia: Secondary | ICD-10-CM

## 2015-11-21 DIAGNOSIS — I2510 Atherosclerotic heart disease of native coronary artery without angina pectoris: Secondary | ICD-10-CM | POA: Diagnosis present

## 2015-11-21 DIAGNOSIS — Z720 Tobacco use: Secondary | ICD-10-CM | POA: Diagnosis not present

## 2015-11-21 DIAGNOSIS — I10 Essential (primary) hypertension: Secondary | ICD-10-CM

## 2015-11-21 DIAGNOSIS — J45909 Unspecified asthma, uncomplicated: Secondary | ICD-10-CM | POA: Diagnosis present

## 2015-11-21 DIAGNOSIS — Z955 Presence of coronary angioplasty implant and graft: Secondary | ICD-10-CM | POA: Diagnosis not present

## 2015-11-21 DIAGNOSIS — M797 Fibromyalgia: Secondary | ICD-10-CM | POA: Diagnosis present

## 2015-11-21 DIAGNOSIS — K760 Fatty (change of) liver, not elsewhere classified: Secondary | ICD-10-CM | POA: Diagnosis not present

## 2015-11-21 DIAGNOSIS — F329 Major depressive disorder, single episode, unspecified: Secondary | ICD-10-CM | POA: Diagnosis present

## 2015-11-21 HISTORY — PX: CARDIAC CATHETERIZATION: SHX172

## 2015-11-21 LAB — TROPONIN I
Troponin I: 0.62 ng/mL (ref <0.031)
Troponin I: 0.93 ng/mL (ref <0.031)

## 2015-11-21 SURGERY — ANTERIOR CERVICAL DECOMPRESSION/DISCECTOMY FUSION 2 LEVELS
Anesthesia: General

## 2015-11-21 SURGERY — LEFT HEART CATH AND CORONARY ANGIOGRAPHY

## 2015-11-21 MED ORDER — MIDAZOLAM HCL 2 MG/2ML IJ SOLN
INTRAMUSCULAR | Status: DC | PRN
  Administered 2015-11-21: 2 mg via INTRAVENOUS
  Administered 2015-11-21: 1 mg via INTRAVENOUS

## 2015-11-21 MED ORDER — ANGIOPLASTY BOOK
Freq: Once | Status: AC
  Administered 2015-11-21: 22:00:00
  Filled 2015-11-21: qty 1

## 2015-11-21 MED ORDER — ACETAMINOPHEN 325 MG PO TABS: 650.0000 mg | ORAL_TABLET | ORAL | Status: DC | PRN

## 2015-11-21 MED ORDER — HEPARIN SODIUM (PORCINE) 1000 UNIT/ML IJ SOLN
INTRAMUSCULAR | Status: DC | PRN
  Administered 2015-11-21: 5000 [IU] via INTRAVENOUS

## 2015-11-21 MED ORDER — SODIUM CHLORIDE 0.9 % IV SOLN
INTRAVENOUS | Status: AC
Start: 2015-11-21 — End: 2015-11-21

## 2015-11-21 MED ORDER — HYDROCODONE-ACETAMINOPHEN 10-325 MG PO TABS
1.0000 | ORAL_TABLET | Freq: Four times a day (QID) | ORAL | Status: DC | PRN
  Administered 2015-11-21 – 2015-11-22 (×2): 1 via ORAL
  Filled 2015-11-21 (×2): qty 1

## 2015-11-21 MED ORDER — IOPAMIDOL (ISOVUE-370) INJECTION 76%
INTRAVENOUS | Status: AC
  Filled 2015-11-21: qty 100

## 2015-11-21 MED ORDER — HEART ATTACK BOUNCING BOOK
Freq: Once | Status: AC
  Administered 2015-11-21: 22:00:00
  Filled 2015-11-21: qty 1

## 2015-11-21 MED ORDER — IOPAMIDOL (ISOVUE-370) INJECTION 76%
INTRAVENOUS | Status: DC | PRN
  Administered 2015-11-21: 85 mL via INTRAVENOUS

## 2015-11-21 MED ORDER — LIDOCAINE HCL (PF) 1 % IJ SOLN
INTRAMUSCULAR | Status: DC | PRN
Start: 2015-11-21 — End: 2015-11-21
  Administered 2015-11-21: 4 mL

## 2015-11-21 MED ORDER — FENTANYL CITRATE (PF) 100 MCG/2ML IJ SOLN
INTRAMUSCULAR | Status: AC
  Filled 2015-11-21: qty 2

## 2015-11-21 MED ORDER — FENTANYL CITRATE (PF) 100 MCG/2ML IJ SOLN
INTRAMUSCULAR | Status: DC | PRN
  Administered 2015-11-21: 50 ug via INTRAVENOUS
  Administered 2015-11-21: 25 ug via INTRAVENOUS

## 2015-11-21 MED ORDER — SODIUM CHLORIDE 0.9% FLUSH: 3.0000 mL | INTRAVENOUS | Status: DC | PRN

## 2015-11-21 MED ORDER — VERAPAMIL HCL 2.5 MG/ML IV SOLN
INTRAVENOUS | Status: AC
  Filled 2015-11-21: qty 2

## 2015-11-21 MED ORDER — VERAPAMIL HCL 2.5 MG/ML IV SOLN
INTRAVENOUS | Status: DC | PRN
  Administered 2015-11-21: 17:00:00 via INTRA_ARTERIAL

## 2015-11-21 MED ORDER — HEPARIN (PORCINE) IN NACL 2-0.9 UNIT/ML-% IJ SOLN
INTRAMUSCULAR | Status: DC | PRN
  Administered 2015-11-21: 1000 mL

## 2015-11-21 MED ORDER — HEPARIN SODIUM (PORCINE) 1000 UNIT/ML IJ SOLN
INTRAMUSCULAR | Status: AC
  Filled 2015-11-21: qty 1

## 2015-11-21 MED ORDER — FLUOXETINE HCL 20 MG PO CAPS
60.0000 mg | ORAL_CAPSULE | Freq: Every day | ORAL | Status: DC
  Administered 2015-11-21 – 2015-11-22 (×2): 60 mg via ORAL
  Filled 2015-11-21: qty 3

## 2015-11-21 MED ORDER — SODIUM CHLORIDE 0.9 % IV SOLN: 250.0000 mL | INTRAVENOUS | Status: DC | PRN

## 2015-11-21 MED ORDER — LIDOCAINE HCL (PF) 1 % IJ SOLN
INTRAMUSCULAR | Status: AC
  Filled 2015-11-21: qty 30

## 2015-11-21 MED ORDER — ENOXAPARIN SODIUM 100 MG/ML ~~LOC~~ SOLN
1.0000 mg/kg | Freq: Two times a day (BID) | SUBCUTANEOUS | Status: DC
  Administered 2015-11-21: 100 mg via SUBCUTANEOUS
  Filled 2015-11-21: qty 1

## 2015-11-21 MED ORDER — HEPARIN (PORCINE) IN NACL 2-0.9 UNIT/ML-% IJ SOLN
INTRAMUSCULAR | Status: AC
  Filled 2015-11-21: qty 1000

## 2015-11-21 MED ORDER — HEPARIN (PORCINE) IN NACL 100-0.45 UNIT/ML-% IJ SOLN: 1000.0000 [IU]/h | INTRAMUSCULAR | Status: DC

## 2015-11-21 MED ORDER — ONDANSETRON HCL 4 MG/2ML IJ SOLN: 4.0000 mg | Freq: Four times a day (QID) | INTRAMUSCULAR | Status: DC | PRN

## 2015-11-21 MED ORDER — MIDAZOLAM HCL 2 MG/2ML IJ SOLN
INTRAMUSCULAR | Status: AC
  Filled 2015-11-21: qty 2

## 2015-11-21 MED ORDER — SODIUM CHLORIDE 0.9% FLUSH: 3.0000 mL | Freq: Two times a day (BID) | INTRAVENOUS | Status: DC

## 2015-11-21 SURGICAL SUPPLY — 11 items
CATH INFINITI 5 FR JL3.5 (CATHETERS) ×1 IMPLANT
CATH INFINITI 5FR ANG PIGTAIL (CATHETERS) ×1 IMPLANT
CATH INFINITI JR4 5F (CATHETERS) ×1 IMPLANT
DEVICE RAD COMP TR BAND LRG (VASCULAR PRODUCTS) ×1 IMPLANT
GLIDESHEATH SLEND SS 6F .021 (SHEATH) ×1 IMPLANT
KIT HEART LEFT (KITS) ×2 IMPLANT
PACK CARDIAC CATHETERIZATION (CUSTOM PROCEDURE TRAY) ×2 IMPLANT
SYR MEDRAD MARK V 150ML (SYRINGE) ×2 IMPLANT
TRANSDUCER W/STOPCOCK (MISCELLANEOUS) ×2 IMPLANT
TUBING CIL FLEX 10 FLL-RA (TUBING) ×2 IMPLANT
WIRE SAFE-T 1.5MM-J .035X260CM (WIRE) ×1 IMPLANT

## 2015-11-21 NOTE — Progress Notes (Signed)
Troponin of 0.93, on call MD notified.

## 2015-11-21 NOTE — Progress Notes (Addendum)
ANTICOAGULATION CONSULT NOTE - Preliminary  Pharmacy Consult for Enoxaparin Indication: ACS/NSTEMI/CP  Allergies  Allergen Reactions  . Albuterol Other (See Comments)    Tremors  . Cymbalta [Duloxetine Hcl]     Felt bad  . Lyrica [Pregabalin]     Retained fluids    Patient Measurements: Height: '5\' 6"'$  (167.6 cm) Weight: 223 lb 6.4 oz (101.334 kg) IBW/kg (Calculated) : 59.3 HEPARIN DW (KG): 82.5   Vital Signs: Temp: 97.9 F (36.6 C) (04/19 0420) Temp Source: Oral (04/19 0420) BP: 110/55 mmHg (04/19 0420) Pulse Rate: 54 (04/19 0420)  Labs:  Recent Labs  11/20/15 1829 11/20/15 2121 11/21/15 0006 11/21/15 0302  HGB 11.7*  --   --   --   HCT 35.5*  --   --   --   PLT 308  --   --   --   APTT 26  --   --   --   LABPROT 14.0  --   --   --   INR 1.06  --   --   --   CREATININE 0.93  --   --   --   TROPONINI 0.04* 0.24* 0.62* 0.93*   Estimated Creatinine Clearance: 76.3 mL/min (by C-G formula based on Cr of 0.93).  Medical History: Past Medical History  Diagnosis Date  . Anxiety   . Depression     Prior suicide attempt  . GERD (gastroesophageal reflux disease)   . Hypothyroidism   . Asthma   . Coronary atherosclerosis of native coronary artery     Nonobstructive circ/RCA, DES LAD/diagonal 2005, LVEF 60%  . Palpitations     PVC's have been noted  . NSTEMI (non-ST elevated myocardial infarction) ([place]) 2005  . Fibromyalgia   . Essential hypertension, benign     SHE DENIES HAVING THIS   . Sleep apnea     LAST TEST WAS OVER 10 YRS AGO.  (NEGATIVE)  . Arthritis     Medications:   Assessment: 60 yo female with multiple coronary risk factors seen in the ED for chest pain on exertion. Admitted for evaluation of possible ACS/NSTEMI/CP.  Goal of Therapy:  Therapeutic 4 hr heparin level 0.6-1 unit/ml Monitor platelets by anticoagulation: yes   Plan:  Preliminary review of pertinent patient information completed.  Forestine Na clinical pharmacist will complete  review during morning rounds to assess the patient and finalize treatment regimen.  Norberto Sorenson, [facility] 11/21/2015,5:02 AM  Agree with current regimen. Will monitor for S/S of bleeding.  Isac Sarna, BS [place], BCPS Clinical Pharmacist Pager [phone]

## 2015-11-21 NOTE — Progress Notes (Signed)
Report given to [facility] and Lauren at [employer]. Patient stable without complaint, d/ced from unit with [facility].

## 2015-11-21 NOTE — Progress Notes (Signed)
PROGRESS NOTE  [name] XTG:[id number] DOB: 02-28-1956 DOA: 11/20/2015 PCP: Sallee Lange, MD  HPI/Recap of past 24 hours:  Currently chest pain free, awaiting transfer to [place] to have cardiac catheterization  Assessment/Plan: Principal Problem:   Chest pain on exertion Active Problems:   Mixed hyperlipidemia   Essential hypertension, benign   CORONARY ATHEROSCLEROSIS NATIVE CORONARY ARTERY   Fibromyalgia   Anxiety and depression   GERD (gastroesophageal reflux disease)   Fatty liver disease, nonalcoholic   Morbid obesity ([place])   Cervical myelopathy ([facility])  Principal Problem:  Chest pain on exertion- with troponin elevation, cardiology consulted , patient will be transferred to [place] to have cardiac cath under cardiology service . [facility] hospitalist will sign off.  Active Problems: Stable unless o/w noted  Mixed hyperlipidemia  Essential hypertension, benign  CORONARY ATHEROSCLEROSIS NATIVE CORONARY ARTERY  Fibromyalgia  Anxiety and depression  GERD (gastroesophageal reflux disease)  Fatty liver disease, nonalcoholic  Morbid obesity ([facility])  Cervical myelopathy ([place])- surgery 4/19 cancelled  Code Status: full  Family Communication: patient   Disposition Plan: transfer to [place]   Consultants:  cardiology  Procedures:  none  Antibiotics:  none   Objective: BP 110/55 mmHg  Pulse 54  Temp(Src) 97.9 F (36.6 C) (Oral)  Resp 16  Ht '5\' 6"'$  (1.676 m)  Wt 101.334 kg (223 lb 6.4 oz)  BMI 36.08 kg/m2  SpO2 97% No intake or output data in the 24 hours ending 11/21/15 0814 Filed Weights   11/20/15 1743 11/20/15 1920  Weight: 102.059 kg (225 lb) 101.334 kg (223 lb 6.4 oz)    Exam:   General:  NAD  Cardiovascular: RRR  Respiratory: CTABL  Abdomen: Soft/ND/NT, positive BS  Musculoskeletal: No Edema  Neuro: aaox3  Data Reviewed: Basic Metabolic Panel:  Recent Labs Lab 11/16/15 1013 11/20/15 1829  NA 142  141  K 4.5 4.0  CL 106 106  CO2 27 27  GLUCOSE 108* 149*  BUN 7 11  CREATININE 0.90 0.93  CALCIUM 9.7 8.6*   Liver Function Tests:  Recent Labs Lab 11/20/15 1829  AST 19  ALT 17  ALKPHOS 86  BILITOT 0.2*  PROT 6.8  ALBUMIN 3.1*   No results for input(s): LIPASE, AMYLASE in the last 168 hours. No results for input(s): AMMONIA in the last 168 hours. CBC:  Recent Labs Lab 11/16/15 1013 11/20/15 1829  WBC 8.9 9.6  HGB 12.7 11.7*  HCT 39.3 35.5*  MCV 93.8 94.7  PLT 271 308   Cardiac Enzymes:    Recent Labs Lab 11/20/15 1829 11/20/15 2121 11/21/15 0006 11/21/15 0302  TROPONINI 0.04* 0.24* 0.62* 0.93*   BNP (last 3 results) No results for input(s): BNP in the last 8760 hours.  ProBNP (last 3 results) No results for input(s): PROBNP in the last 8760 hours.  CBG: No results for input(s): GLUCAP in the last 168 hours.  Recent Results (from the past 240 hour(s))  Surgical pcr screen     Status: None   Collection Time: 11/16/15 10:12 AM  Result Value Ref Range Status   MRSA, PCR NEGATIVE NEGATIVE Final   Staphylococcus aureus NEGATIVE NEGATIVE Final    Comment:        The Xpert SA Assay (FDA approved for NASAL specimens in patients over 62 years of age), is one component of a comprehensive surveillance program.  Test performance has been validated by [facility] for patients greater than or equal to 40 year old. It is not  intended to diagnose infection nor to guide or monitor treatment.      Studies: Dg Chest Portable 1 View  11/20/2015  CLINICAL DATA:  Encasing dog up a hill and started having chest pain earlier today. EXAM: PORTABLE CHEST 1 VIEW COMPARISON:  11/08/2011. FINDINGS: 1807 hours. Lung volumes are low. Interstitial markings are diffusely coarsened with chronic features. There is pulmonary vascular congestion without overt pulmonary edema. Cardiopericardial silhouette is at upper limits of normal for size. The visualized bony structures of  the thorax are intact. Telemetry leads overlie the chest. IMPRESSION: Low volume film with vascular congestion. Electronically Signed   By: Misty Stanley M.D.   On: 11/20/2015 18:20    Scheduled Meds: . aspirin  324 mg Oral Once  . aspirin EC  325 mg Oral Daily  . atorvastatin  80 mg Oral q1800  . clopidogrel  75 mg Oral Daily  . enoxaparin (LOVENOX) injection  1 mg/kg Subcutaneous Q12H  . FLUoxetine  60 mg Oral Daily  . metoprolol tartrate  25 mg Oral BID    Continuous Infusions:    Time spent: 63mns  Camdan Burdi MD, PhD  [facility] Pager [phone] If 7PM-7AM, please contact night-coverage at [email or web address], password [email or web address]4/19/2017, 8:14 AM

## 2015-11-21 NOTE — H&P (View-Only) (Signed)
    Reason for Consult: Chest pain and abnormal troponin I Referring Physician: PTH, Dr. Fang Xu Cardiologist: Dr. Ashtyn Meland G. Lumina Gitto  [name] is an 60 y.o. female patient of Dr. Ellissa Ayo with known CAD who was seen in March, clinically stable at that time, and cleared for cervical neck surgery (to be performed today by Dr. Botero actually). She stopped her Plavix in anticipation of surgery (7 days off recommended). She was chasing her dog yesterday and developed chest pressure into neck unrelieved with old NTG, lasted 1 hour so called EMS. They gave her a NTG with relief, EKG ok so she decided not to go to ER. Several hours later the chest tightness returned at rest so EMS again called and relieved with 2 SL NTG. Troponins trending up to 0.93 this am. EKG without acute change. No further pain since admission. She continues to smoke 1/2 ppd but remains active.  She has a history of CAD S/P stent LAD/Diagonal 2005, repeat cath 2013 patent with continued medical therapy. See cath details below. Also has HTN, bradycardia-lopressor decreased to 25 mg BID, HLD on statin.   Past Medical History  Diagnosis Date  . Anxiety   . Depression     Prior suicide attempt  . GERD (gastroesophageal reflux disease)   . Hypothyroidism   . Asthma   . Coronary atherosclerosis of native coronary artery     Nonobstructive circ/RCA, DES LAD/diagonal 2005, LVEF 60%  . Palpitations     PVC's have been noted  . NSTEMI (non-ST elevated myocardial infarction) (HCC) 2005  . Fibromyalgia   . Essential hypertension, benign   . Sleep apnea   . Arthritis     Past Surgical History  Procedure Laterality Date  . Tubal ligation    . Foot bone excision      Sesmoid bone left foot-repair of fracture  . Carpal tunnel release  2007 and 2008    Bilateral  . Cardiac catheterization    . Coronary angioplasty      Family History  Problem Relation Age of Onset  . Anesthesia problems Neg Hx   . Hypotension  Neg Hx   . Malignant hyperthermia Neg Hx   . Pseudochol deficiency Neg Hx   . Coronary artery disease Father     MI age 56    Social History:  reports that she has been smoking Cigarettes.  She has a 12 pack-year smoking history. She has never used smokeless tobacco. She reports that she does not drink alcohol or use illicit drugs.  Allergies:  Allergies  Allergen Reactions  . Albuterol Other (See Comments)    Tremors  . Cymbalta [Duloxetine Hcl]     Felt bad  . Lyrica [Pregabalin]     Retained fluids    Medications:  Scheduled Meds: . aspirin EC  325 mg Oral Daily  . atorvastatin  80 mg Oral q1800  . clopidogrel  75 mg Oral Daily  . enoxaparin (LOVENOX) injection  1 mg/kg Subcutaneous Q12H  . FLUoxetine  60 mg Oral Daily  . metoprolol tartrate  25 mg Oral BID   Continuous Infusions:  PRN Meds:.acetaminophen, gi cocktail, levalbuterol, morphine injection, nitroGLYCERIN, ondansetron (ZOFRAN) IV   Results for orders placed or performed during the hospital encounter of 11/20/15 (from the past 48 hour(s))  APTT     Status: None   Collection Time: 11/20/15  6:29 PM  Result Value Ref Range   aPTT 26 24 - 37 seconds  CBC       Status: Abnormal   Collection Time: 11/20/15  6:29 PM  Result Value Ref Range   WBC 9.6 4.0 - 10.5 K/uL   RBC 3.75 (L) 3.87 - 5.11 MIL/uL   Hemoglobin 11.7 (L) 12.0 - 15.0 g/dL   HCT 35.5 (L) 36.0 - 46.0 %   MCV 94.7 78.0 - 100.0 fL   MCH 31.2 26.0 - 34.0 pg   MCHC 33.0 30.0 - 36.0 g/dL   RDW 14.2 11.5 - 15.5 %   Platelets 308 150 - 400 K/uL  Comprehensive metabolic panel     Status: Abnormal   Collection Time: 11/20/15  6:29 PM  Result Value Ref Range   Sodium 141 135 - 145 mmol/L   Potassium 4.0 3.5 - 5.1 mmol/L   Chloride 106 101 - 111 mmol/L   CO2 27 22 - 32 mmol/L   Glucose, Bld 149 (H) 65 - 99 mg/dL   BUN 11 6 - 20 mg/dL   Creatinine, Ser 0.93 0.44 - 1.00 mg/dL   Calcium 8.6 (L) 8.9 - 10.3 mg/dL   Total Protein 6.8 6.5 - 8.1 g/dL    Albumin 3.1 (L) 3.5 - 5.0 g/dL   AST 19 15 - 41 U/L   ALT 17 14 - 54 U/L   Alkaline Phosphatase 86 38 - 126 U/L   Total Bilirubin 0.2 (L) 0.3 - 1.2 mg/dL   GFR calc non Af Amer >60 >60 mL/min   GFR calc Af Amer >60 >60 mL/min    Comment: (NOTE) The eGFR has been calculated using the CKD EPI equation. This calculation has not been validated in all clinical situations. eGFR's persistently <60 mL/min signify possible Chronic Kidney Disease.    Anion gap 8 5 - 15  Protime-INR     Status: None   Collection Time: 11/20/15  6:29 PM  Result Value Ref Range   Prothrombin Time 14.0 11.6 - 15.2 seconds   INR 1.06 0.00 - 1.49  Troponin I     Status: Abnormal   Collection Time: 11/20/15  6:29 PM  Result Value Ref Range   Troponin I 0.04 (H) <0.031 ng/mL    Comment:        PERSISTENTLY INCREASED TROPONIN VALUES IN THE RANGE OF 0.04-0.49 ng/mL CAN BE SEEN IN:       -UNSTABLE ANGINA       -CONGESTIVE HEART FAILURE       -MYOCARDITIS       -CHEST TRAUMA       -ARRYHTHMIAS       -LATE PRESENTING MYOCARDIAL INFARCTION       -COPD   CLINICAL FOLLOW-UP RECOMMENDED.   Troponin I-serum (0, 3, 6 hours)     Status: Abnormal   Collection Time: 11/20/15  9:21 PM  Result Value Ref Range   Troponin I 0.24 (H) <0.031 ng/mL    Comment:        PERSISTENTLY INCREASED TROPONIN VALUES IN THE RANGE OF 0.04-0.49 ng/mL CAN BE SEEN IN:       -UNSTABLE ANGINA       -CONGESTIVE HEART FAILURE       -MYOCARDITIS       -CHEST TRAUMA       -ARRYHTHMIAS       -LATE PRESENTING MYOCARDIAL INFARCTION       -COPD   CLINICAL FOLLOW-UP RECOMMENDED.   Troponin I-serum (0, 3, 6 hours)     Status: Abnormal   Collection Time: 11/21/15 12:06 AM  Result Value Ref Range   Troponin   I 0.62 (HH) <0.031 ng/mL    Comment:        POSSIBLE MYOCARDIAL ISCHEMIA. SERIAL TESTING RECOMMENDED. DELTA CHECK NOTED CRITICAL VALUE NOTED.  VALUE IS CONSISTENT WITH PREVIOUSLY REPORTED AND CALLED VALUE. CRITICAL RESULT CALLED TO,  READ BACK BY AND VERIFIED WITH: DICKERSON,M ON 11/21/15 @ 0900 BY MITCHELL,S   Troponin I-serum (0, 3, 6 hours)     Status: Abnormal   Collection Time: 11/21/15  3:02 AM  Result Value Ref Range   Troponin I 0.93 (HH) <0.031 ng/mL    Comment:        POSSIBLE MYOCARDIAL ISCHEMIA. SERIAL TESTING RECOMMENDED. DELTA CHECK NOTED CRITICAL VALUE NOTED.  VALUE IS CONSISTENT WITH PREVIOUSLY REPORTED AND CALLED VALUE. CRITICAL RESULT CALLED TO, READ BACK BY AND VERIFIED WITH: DICKERSON,M ON 11/21/15 @ 0900 BY MITCHELL,S     Dg Chest Portable 1 View  11/20/2015  CLINICAL DATA:  Encasing dog up a hill and started having chest pain earlier today. EXAM: PORTABLE CHEST 1 VIEW COMPARISON:  11/08/2011. FINDINGS: 1807 hours. Lung volumes are low. Interstitial markings are diffusely coarsened with chronic features. There is pulmonary vascular congestion without overt pulmonary edema. Cardiopericardial silhouette is at upper limits of normal for size. The visualized bony structures of the thorax are intact. Telemetry leads overlie the chest. IMPRESSION: Low volume film with vascular congestion. Electronically Signed   By: Eric  Mansell M.D.   On: 11/20/2015 18:20    ROS  See HPI Eyes: Negative Ears:Negative for hearing loss, tinnitus Cardiovascular: Negative for palpitations,irregular heartbeat, dyspnea, dyspnea on exertion, near-syncope, orthopnea, paroxysmal nocturnal dyspnea and syncope,edema, claudication, cyanosis,.  Respiratory:   Negative for  hemoptysis, shortness of breath, sleep disturbances due to breathing, sputum production and wheezing.   Endocrine: Negative for cold intolerance and heat intolerance.  Hematologic/Lymphatic: Negative for adenopathy and bleeding problem. Does not bruise/bleed easily.  Musculoskeletal: chronic neck pain.   Gastrointestinal: Negative for nausea, vomiting, reflux, abdominal pain, diarrhea, constipation.   Genitourinary: Negative for bladder incontinence, dysuria,  flank pain, frequency, hematuria, hesitancy, nocturia and urgency.  Neurological: Negative.  Allergic/Immunologic: Negative for environmental allergies.  Blood pressure 110/55, pulse 54, temperature 97.9 F (36.6 C), temperature source Oral, resp. rate 16, height 5' 6" (1.676 m), weight 223 lb 6.4 oz (101.334 kg), SpO2 97 %. Physical Exam PHYSICAL EXAM: Well-nournished, in no acute distress. Neck: No JVD, HJR, Bruit, or thyroid enlargement Lungs: Deceased BS, No tachypnea, clear without wheezing, rales, or rhonchi Cardiovascular: RRR, PMI not displaced, heart sounds distant, no murmurs, gallops, bruit, thrill, or heave. Abdomen: BS normal. Soft without organomegaly, masses, lesions or tenderness. Extremities: without cyanosis, clubbing or edema. Good distal pulses bilateral SKin: Warm, no lesions or rashes  Musculoskeletal: No deformities Neuro: no focal signs  Cardiac catheterization 12/03/2011: Coronary angiography: Coronary dominance: Right     Left Main:  Normal in size with 10% distal stenosis at the bifurcation.  Left Anterior Descending (LAD):  Medium in size. An overlapped bifurcation proximal LAD/diagonal stents are noted ( crush technique) without significant restenosis. The diagonal system is actually much larger than the LAD itself which continues as mostly 2 septal perforators. The mid and distal LAD has minor atherosclerosis without obstructive disease.  1st diagonal (D1):  Very large in size and supplies most of the LAD distribution. The stent is noted proximally which is patent without significant restenosis. In the midsegment, there is a 40% tubular stenosis.  2nd diagonal (D2):  Normal in size without significant disease.  Circumflex (LCx):    Normal in size and nondominant. There is a 20% ostial disease. The rest of the vessel has minor irregularities.  1st obtuse marginal:  Normal in size with 30% ostial stenosis.  2nd obtuse marginal:  Normal in size and free of  significant disease.  3rd obtuse marginal:  Normal in size and free of significant disease.       Right Coronary Artery: Large in size and dominant. There is diffuse 20% disease proximally. Otherwise no evidence of obstructive disease.  posterior descending artery: Large in size and reaches the apex. It has no significant disease.  posterior lateral branch:  No significant disease.  Left ventriculography: Left ventricular systolic function is low normal , LVEF is estimated at 50-55 %, there is no significant mitral regurgitation . There is moderate mid to distal anterolateral hypokinesis.  Final Conclusions:   1. Patent LAD/diagonal stents without significant restenosis. Mild CAD without evidence of obstructive disease at this time. 2. Low normal LV systolic function with an estimated ejection fraction of 50-55% with moderate anterolateral hypokinesis. 3. Mildly elevated left ventricular end-diastolic pressure.  Assessment/Plan:  NSTEMI Chest tightness occurred after chasing her dog outdoors. She has been off of Plavix 10 days for planned cervical neck surgery that was actually scheduled for today. Transfer to [facility] for cardiac catherization. Patient agreeable.I have reviewed the risks, indications, and alternatives to angioplasty and stenting with the patient. Risks include but are not limited to bleeding, infection, vascular injury, stroke, myocardial infection, arrhythmia, kidney injury, radiation-related injury in the case of prolonged fluoroscopy use, emergency cardiac surgery, and death. The patient understands the risks of serious complication is low (<1%) and he agrees to proceed.   HTN  HLD  SMOKER: 1/2 ppd-advised to quit  Bradycardia: Lopressor recently decreased.  Cervical neck pain: surgery scheduled for today cancelled.   Michele Lenze PA-C  11/21/2015, 10:01 AM    Attending note:  Patient seen and examined. Reviewed records and agree with above assessment by Ms.  Lenze PA-C. Ms. Boehler has a history of CAD status post DES to the LAD and diagonal back in 2005 with nonobstructive circumflex and RCA disease. Cardiac catheterization in 2013 revealed patent stent sites. She has been managed medically and was seen in the office in March for preoperative assessment prior to cervical disc surgery. She stopped Plavix as directed in anticipation of her surgery (to be off at least 7 days). Yesterday she states that her dog got outside unattended and she was rushing around vigorously trying to corral him and get him back indoors. When she finally got back inside, she began experiencing a tightness in her chest. She took an old nitroglycerin without relief and ultimately summoned EMS. She states that she got improvement in her chest tightness with nitroglycerin provided by EMS, and was told that her ECG looked okay. She decided not to go to the ER at that time, but ultimately called EMS back after a few hours with recurring chest discomfort. Under observation in the hospital she has not had recurrent chest discomfort, however her troponin I levels have increased from 0.04 up to 0.93. ECG shows sinus bradycardia with no acute ST segment changes.  On examination this morning she appears comfortable without chest discomfort. Heart rate is in the 50s in sinus rhythm, systolic blood pressure 110 to 130. Lungs are clear without labored breathing, cardiac exam reveals RRR without gallop. Lab work reveals creatinine 0.9, potassium 4.0, hemoglobin 11.7, platelets 308. Chest x-ray shows low volumes with vascular congestion.    Cardiac enzymes suggestive of NSTEMI with symptom onset yesterday, prompted by exertion, patient also off Plavix for elective cervical disc surgery which was actually scheduled for today. Last coronary intervention was in 2005 as detailed above. Situation discussed with patient. Plan is to transfer her to our cardiology service at [facility] in anticipation of a  diagnostic cardiac catheterization to reevaluate coronary anatomy. If there has been progression that requires placement of a new stent and resumption of DAPT, she will have to defer cervical disc surgery for at least 6 months to a year. Continue aspirin, Lipitor, Lopressor, Plavix (was resumed). She received treatment dose Lovenox in early a.m. - can convert to IV heparin if cardiac catheterization is is deferred until tomorrow.  Tamanna Whitson G. Alara Daniel, M.D., F.A.C.C.  

## 2015-11-21 NOTE — Progress Notes (Signed)
ANTICOAGULATION CONSULT NOTE - Initial Consult  Pharmacy Consult for Heparin Indication: chest pain/ACS  Allergies  Allergen Reactions  . Albuterol Other (See Comments)    Tremors  . Cymbalta [Duloxetine Hcl]     Felt bad  . Lyrica [Pregabalin]     Retained fluids    Patient Measurements: Height: '5\' 6"'$  (167.6 cm) Weight: 223 lb 6.4 oz (101.334 kg) IBW/kg (Calculated) : 59.3 HEPARIN DW (KG): 82.5  Vital Signs: Temp: 97.9 F (36.6 C) (04/19 0420) Temp Source: Oral (04/19 0420) BP: 110/55 mmHg (04/19 0420) Pulse Rate: 54 (04/19 0420)  Labs:  Recent Labs  11/20/15 1829 11/20/15 2121 11/21/15 0006 11/21/15 0302  HGB 11.7*  --   --   --   HCT 35.5*  --   --   --   PLT 308  --   --   --   APTT 26  --   --   --   LABPROT 14.0  --   --   --   INR 1.06  --   --   --   CREATININE 0.93  --   --   --   TROPONINI 0.04* 0.24* 0.62* 0.93*    Estimated Creatinine Clearance: 76.3 mL/min (by C-G formula based on Cr of 0.93).   Medical History: Past Medical History  Diagnosis Date  . Anxiety   . Depression     Prior suicide attempt  . GERD (gastroesophageal reflux disease)   . Hypothyroidism   . Asthma   . Coronary atherosclerosis of native coronary artery     Nonobstructive circ/RCA, DES LAD/diagonal 2005, LVEF 60%  . Palpitations     PVC's have been noted  . NSTEMI (non-ST elevated myocardial infarction) ([place]) 2005  . Fibromyalgia   . Essential hypertension, benign   . Sleep apnea   . Arthritis     Medications:  Prescriptions prior to admission  Medication Sig Dispense Refill Last Dose  . atorvastatin (LIPITOR) 80 MG tablet Take 1 tablet (80 mg total) by mouth daily. 90 tablet 0 11/20/2015 at Unknown time  . FLUoxetine (PROZAC) 20 MG tablet Take 60 mg by mouth daily.   11/20/2015 at Unknown time  . isosorbide mononitrate (IMDUR) 30 MG 24 hr tablet TAKE (1/2) TABLET BY MOUTH DAILY. (Patient taking differently: Take 15 mg by mouth daily. ) 15 tablet 12 11/20/2015 at  Unknown time  . levothyroxine (SYNTHROID, LEVOTHROID) 150 MCG tablet TAKE 1 TABLET BY MOUTH ONCE DAILY. PATIENT NEEDS TO BE SEEN. (Patient taking differently: Take 150 mcg by mouth daily before breakfast. ) 30 tablet 5 11/20/2015 at Unknown time  . metoprolol tartrate (LOPRESSOR) 25 MG tablet Take 1 tablet (25 mg total) by mouth 2 (two) times daily. 180 tablet 3 11/20/2015 at 800  . nitroGLYCERIN (NITROSTAT) 0.4 MG SL tablet Place 0.4 mg under the tongue every 5 (five) minutes x 3 doses as needed for chest pain.   11/20/2015 at Unknown time  . pantoprazole (PROTONIX) 40 MG tablet TAKE 1 TABLET DAILY FOR ACID REFLUX. (Patient taking differently: Take 40 mg by mouth daily. ) 30 tablet 6 11/20/2015 at Unknown time  . clopidogrel (PLAVIX) 75 MG tablet Take 1 tablet (75 mg total) by mouth daily. 30 tablet 6 on hold  . levalbuterol (XOPENEX HFA) 45 MCG/ACT inhaler Inhale 2 puffs into the lungs every 4 (four) hours as needed for wheezing. 1 Inhaler 12 unknown    Assessment: 60 yo female with multiple coronary risk factors seen in the ED  for chest pain on exertion.She stopped her Plavix in anticipation of surgery (7 days off recommended) for planned cervical neck surgery scheduled for today. Marland Kitchen She was chasing her dog yesterday and developed chest pressure into neck unrelieved with old NTG, lasted 1 hour so called EMS. Admitted for evaluation of possible ACS/NSTEMI/CP.[facility]. Patient did receive lovenox '1mg'$ /kg this AM at 0430. Heparin to start at 1630.  Goal of Therapy:  Heparin level 0.3-0.7 units/ml Monitor platelets by anticoagulation protocol: Yes   Plan:  Start heparin infusion at 1000 units/hr Check anti-Xa level in 6-8 hours and daily while on heparin Continue to monitor H&H and platelets  Isac Sarna, BS [place]ena [place], BCPS Clinical Pharmacist Pager [phone]   Cristy Friedlander 11/21/2015,11:17 AM

## 2015-11-21 NOTE — Consult Note (Signed)
    Reason for Consult: Chest pain and abnormal troponin I Referring Physician: PTH, Dr. Fang Xu Cardiologist: Dr. Samuel G. McDowell  [name] is an 60 y.o. female patient of Dr. McDowell with known CAD who was seen in March, clinically stable at that time, and cleared for cervical neck surgery (to be performed today by Dr. Botero actually). She stopped her Plavix in anticipation of surgery (7 days off recommended). She was chasing her dog yesterday and developed chest pressure into neck unrelieved with old NTG, lasted 1 hour so called EMS. They gave her a NTG with relief, EKG ok so she decided not to go to ER. Several hours later the chest tightness returned at rest so EMS again called and relieved with 2 SL NTG. Troponins trending up to 0.93 this am. EKG without acute change. No further pain since admission. She continues to smoke 1/2 ppd but remains active.  She has a history of CAD S/P stent LAD/Diagonal 2005, repeat cath 2013 patent with continued medical therapy. See cath details below. Also has HTN, bradycardia-lopressor decreased to 25 mg BID, HLD on statin.   Past Medical History  Diagnosis Date  . Anxiety   . Depression     Prior suicide attempt  . GERD (gastroesophageal reflux disease)   . Hypothyroidism   . Asthma   . Coronary atherosclerosis of native coronary artery     Nonobstructive circ/RCA, DES LAD/diagonal 2005, LVEF 60%  . Palpitations     PVC's have been noted  . NSTEMI (non-ST elevated myocardial infarction) (HCC) 2005  . Fibromyalgia   . Essential hypertension, benign   . Sleep apnea   . Arthritis     Past Surgical History  Procedure Laterality Date  . Tubal ligation    . Foot bone excision      Sesmoid bone left foot-repair of fracture  . Carpal tunnel release  2007 and 2008    Bilateral  . Cardiac catheterization    . Coronary angioplasty      Family History  Problem Relation Age of Onset  . Anesthesia problems Neg Hx   . Hypotension  Neg Hx   . Malignant hyperthermia Neg Hx   . Pseudochol deficiency Neg Hx   . Coronary artery disease Father     MI age 56    Social History:  reports that she has been smoking Cigarettes.  She has a 12 pack-year smoking history. She has never used smokeless tobacco. She reports that she does not drink alcohol or use illicit drugs.  Allergies:  Allergies  Allergen Reactions  . Albuterol Other (See Comments)    Tremors  . Cymbalta [Duloxetine Hcl]     Felt bad  . Lyrica [Pregabalin]     Retained fluids    Medications:  Scheduled Meds: . aspirin EC  325 mg Oral Daily  . atorvastatin  80 mg Oral q1800  . clopidogrel  75 mg Oral Daily  . enoxaparin (LOVENOX) injection  1 mg/kg Subcutaneous Q12H  . FLUoxetine  60 mg Oral Daily  . metoprolol tartrate  25 mg Oral BID   Continuous Infusions:  PRN Meds:.acetaminophen, gi cocktail, levalbuterol, morphine injection, nitroGLYCERIN, ondansetron (ZOFRAN) IV   Results for orders placed or performed during the hospital encounter of 11/20/15 (from the past 48 hour(s))  APTT     Status: None   Collection Time: 11/20/15  6:29 PM  Result Value Ref Range   aPTT 26 24 - 37 seconds  CBC       Status: Abnormal   Collection Time: 11/20/15  6:29 PM  Result Value Ref Range   WBC 9.6 4.0 - 10.5 K/uL   RBC 3.75 (L) 3.87 - 5.11 MIL/uL   Hemoglobin 11.7 (L) 12.0 - 15.0 g/dL   HCT 35.5 (L) 36.0 - 46.0 %   MCV 94.7 78.0 - 100.0 fL   MCH 31.2 26.0 - 34.0 pg   MCHC 33.0 30.0 - 36.0 g/dL   RDW 14.2 11.5 - 15.5 %   Platelets 308 150 - 400 K/uL  Comprehensive metabolic panel     Status: Abnormal   Collection Time: 11/20/15  6:29 PM  Result Value Ref Range   Sodium 141 135 - 145 mmol/L   Potassium 4.0 3.5 - 5.1 mmol/L   Chloride 106 101 - 111 mmol/L   CO2 27 22 - 32 mmol/L   Glucose, Bld 149 (H) 65 - 99 mg/dL   BUN 11 6 - 20 mg/dL   Creatinine, Ser 0.93 0.44 - 1.00 mg/dL   Calcium 8.6 (L) 8.9 - 10.3 mg/dL   Total Protein 6.8 6.5 - 8.1 g/dL    Albumin 3.1 (L) 3.5 - 5.0 g/dL   AST 19 15 - 41 U/L   ALT 17 14 - 54 U/L   Alkaline Phosphatase 86 38 - 126 U/L   Total Bilirubin 0.2 (L) 0.3 - 1.2 mg/dL   GFR calc non Af Amer >60 >60 mL/min   GFR calc Af Amer >60 >60 mL/min    Comment: (NOTE) The eGFR has been calculated using the CKD EPI equation. This calculation has not been validated in all clinical situations. eGFR's persistently <60 mL/min signify possible Chronic Kidney Disease.    Anion gap 8 5 - 15  Protime-INR     Status: None   Collection Time: 11/20/15  6:29 PM  Result Value Ref Range   Prothrombin Time 14.0 11.6 - 15.2 seconds   INR 1.06 0.00 - 1.49  Troponin I     Status: Abnormal   Collection Time: 11/20/15  6:29 PM  Result Value Ref Range   Troponin I 0.04 (H) <0.031 ng/mL    Comment:        PERSISTENTLY INCREASED TROPONIN VALUES IN THE RANGE OF 0.04-0.49 ng/mL CAN BE SEEN IN:       -UNSTABLE ANGINA       -CONGESTIVE HEART FAILURE       -MYOCARDITIS       -CHEST TRAUMA       -ARRYHTHMIAS       -LATE PRESENTING MYOCARDIAL INFARCTION       -COPD   CLINICAL FOLLOW-UP RECOMMENDED.   Troponin I-serum (0, 3, 6 hours)     Status: Abnormal   Collection Time: 11/20/15  9:21 PM  Result Value Ref Range   Troponin I 0.24 (H) <0.031 ng/mL    Comment:        PERSISTENTLY INCREASED TROPONIN VALUES IN THE RANGE OF 0.04-0.49 ng/mL CAN BE SEEN IN:       -UNSTABLE ANGINA       -CONGESTIVE HEART FAILURE       -MYOCARDITIS       -CHEST TRAUMA       -ARRYHTHMIAS       -LATE PRESENTING MYOCARDIAL INFARCTION       -COPD   CLINICAL FOLLOW-UP RECOMMENDED.   Troponin I-serum (0, 3, 6 hours)     Status: Abnormal   Collection Time: 11/21/15 12:06 AM  Result Value Ref Range   Troponin  I 0.62 (HH) <0.031 ng/mL    Comment:        POSSIBLE MYOCARDIAL ISCHEMIA. SERIAL TESTING RECOMMENDED. DELTA CHECK NOTED CRITICAL VALUE NOTED.  VALUE IS CONSISTENT WITH PREVIOUSLY REPORTED AND CALLED VALUE. CRITICAL RESULT CALLED TO,  READ BACK BY AND VERIFIED WITH: DICKERSON,M ON 11/21/15 @ 0900 BY MITCHELL,S   Troponin I-serum (0, 3, 6 hours)     Status: Abnormal   Collection Time: 11/21/15  3:02 AM  Result Value Ref Range   Troponin I 0.93 (HH) <0.031 ng/mL    Comment:        POSSIBLE MYOCARDIAL ISCHEMIA. SERIAL TESTING RECOMMENDED. DELTA CHECK NOTED CRITICAL VALUE NOTED.  VALUE IS CONSISTENT WITH PREVIOUSLY REPORTED AND CALLED VALUE. CRITICAL RESULT CALLED TO, READ BACK BY AND VERIFIED WITH: DICKERSON,M ON 11/21/15 @ 0900 BY MITCHELL,S     Dg Chest Portable 1 View  11/20/2015  CLINICAL DATA:  Encasing dog up a hill and started having chest pain earlier today. EXAM: PORTABLE CHEST 1 VIEW COMPARISON:  11/08/2011. FINDINGS: 1807 hours. Lung volumes are low. Interstitial markings are diffusely coarsened with chronic features. There is pulmonary vascular congestion without overt pulmonary edema. Cardiopericardial silhouette is at upper limits of normal for size. The visualized bony structures of the thorax are intact. Telemetry leads overlie the chest. IMPRESSION: Low volume film with vascular congestion. Electronically Signed   By: Eric  Mansell M.D.   On: 11/20/2015 18:20    ROS  See HPI Eyes: Negative Ears:Negative for hearing loss, tinnitus Cardiovascular: Negative for palpitations,irregular heartbeat, dyspnea, dyspnea on exertion, near-syncope, orthopnea, paroxysmal nocturnal dyspnea and syncope,edema, claudication, cyanosis,.  Respiratory:   Negative for  hemoptysis, shortness of breath, sleep disturbances due to breathing, sputum production and wheezing.   Endocrine: Negative for cold intolerance and heat intolerance.  Hematologic/Lymphatic: Negative for adenopathy and bleeding problem. Does not bruise/bleed easily.  Musculoskeletal: chronic neck pain.   Gastrointestinal: Negative for nausea, vomiting, reflux, abdominal pain, diarrhea, constipation.   Genitourinary: Negative for bladder incontinence, dysuria,  flank pain, frequency, hematuria, hesitancy, nocturia and urgency.  Neurological: Negative.  Allergic/Immunologic: Negative for environmental allergies.  Blood pressure 110/55, pulse 54, temperature 97.9 F (36.6 C), temperature source Oral, resp. rate 16, height 5' 6" (1.676 m), weight 223 lb 6.4 oz (101.334 kg), SpO2 97 %. Physical Exam PHYSICAL EXAM: Well-nournished, in no acute distress. Neck: No JVD, HJR, Bruit, or thyroid enlargement Lungs: Deceased BS, No tachypnea, clear without wheezing, rales, or rhonchi Cardiovascular: RRR, PMI not displaced, heart sounds distant, no murmurs, gallops, bruit, thrill, or heave. Abdomen: BS normal. Soft without organomegaly, masses, lesions or tenderness. Extremities: without cyanosis, clubbing or edema. Good distal pulses bilateral SKin: Warm, no lesions or rashes  Musculoskeletal: No deformities Neuro: no focal signs  Cardiac catheterization 12/03/2011: Coronary angiography: Coronary dominance: Right     Left Main:  Normal in size with 10% distal stenosis at the bifurcation.  Left Anterior Descending (LAD):  Medium in size. An overlapped bifurcation proximal LAD/diagonal stents are noted ( crush technique) without significant restenosis. The diagonal system is actually much larger than the LAD itself which continues as mostly 2 septal perforators. The mid and distal LAD has minor atherosclerosis without obstructive disease.  1st diagonal (D1):  Very large in size and supplies most of the LAD distribution. The stent is noted proximally which is patent without significant restenosis. In the midsegment, there is a 40% tubular stenosis.  2nd diagonal (D2):  Normal in size without significant disease.  Circumflex (LCx):    Normal in size and nondominant. There is a 20% ostial disease. The rest of the vessel has minor irregularities.  1st obtuse marginal:  Normal in size with 30% ostial stenosis.  2nd obtuse marginal:  Normal in size and free of  significant disease.  3rd obtuse marginal:  Normal in size and free of significant disease.       Right Coronary Artery: Large in size and dominant. There is diffuse 20% disease proximally. Otherwise no evidence of obstructive disease.  posterior descending artery: Large in size and reaches the apex. It has no significant disease.  posterior lateral branch:  No significant disease.  Left ventriculography: Left ventricular systolic function is low normal , LVEF is estimated at 50-55 %, there is no significant mitral regurgitation . There is moderate mid to distal anterolateral hypokinesis.  Final Conclusions:   1. Patent LAD/diagonal stents without significant restenosis. Mild CAD without evidence of obstructive disease at this time. 2. Low normal LV systolic function with an estimated ejection fraction of 50-55% with moderate anterolateral hypokinesis. 3. Mildly elevated left ventricular end-diastolic pressure.  Assessment/Plan:  NSTEMI Chest tightness occurred after chasing her dog outdoors. She has been off of Plavix 10 days for planned cervical neck surgery that was actually scheduled for today. Transfer to [facility] for cardiac catherization. Patient agreeable.I have reviewed the risks, indications, and alternatives to angioplasty and stenting with the patient. Risks include but are not limited to bleeding, infection, vascular injury, stroke, myocardial infection, arrhythmia, kidney injury, radiation-related injury in the case of prolonged fluoroscopy use, emergency cardiac surgery, and death. The patient understands the risks of serious complication is low (<1%) and he agrees to proceed.   HTN  HLD  SMOKER: 1/2 ppd-advised to quit  Bradycardia: Lopressor recently decreased.  Cervical neck pain: surgery scheduled for today cancelled.   Michele Lenze PA-C  11/21/2015, 10:01 AM    Attending note:  Patient seen and examined. Reviewed records and agree with above assessment by Ms.  Lenze PA-C. Ms. Pant has a history of CAD status post DES to the LAD and diagonal back in 2005 with nonobstructive circumflex and RCA disease. Cardiac catheterization in 2013 revealed patent stent sites. She has been managed medically and was seen in the office in March for preoperative assessment prior to cervical disc surgery. She stopped Plavix as directed in anticipation of her surgery (to be off at least 7 days). Yesterday she states that her dog got outside unattended and she was rushing around vigorously trying to corral him and get him back indoors. When she finally got back inside, she began experiencing a tightness in her chest. She took an old nitroglycerin without relief and ultimately summoned EMS. She states that she got improvement in her chest tightness with nitroglycerin provided by EMS, and was told that her ECG looked okay. She decided not to go to the ER at that time, but ultimately called EMS back after a few hours with recurring chest discomfort. Under observation in the hospital she has not had recurrent chest discomfort, however her troponin I levels have increased from 0.04 up to 0.93. ECG shows sinus bradycardia with no acute ST segment changes.  On examination this morning she appears comfortable without chest discomfort. Heart rate is in the 50s in sinus rhythm, systolic blood pressure 110 to 130. Lungs are clear without labored breathing, cardiac exam reveals RRR without gallop. Lab work reveals creatinine 0.9, potassium 4.0, hemoglobin 11.7, platelets 308. Chest x-ray shows low volumes with vascular congestion.    Cardiac enzymes suggestive of NSTEMI with symptom onset yesterday, prompted by exertion, patient also off Plavix for elective cervical disc surgery which was actually scheduled for today. Last coronary intervention was in 2005 as detailed above. Situation discussed with patient. Plan is to transfer her to our cardiology service at [place] in anticipation of a  diagnostic cardiac catheterization to reevaluate coronary anatomy. If there has been progression that requires placement of a new stent and resumption of DAPT, she will have to defer cervical disc surgery for at least 6 months to a year. Continue aspirin, Lipitor, Lopressor, Plavix (was resumed). She received treatment dose Lovenox in early a.m. - can convert to IV heparin if cardiac catheterization is is deferred until tomorrow.  Samuel G. McDowell, M.D., F.A.C.C.  

## 2015-11-21 NOTE — Progress Notes (Addendum)
Troponin posted at 0006 of 0.62 was not notified by Lab.  Noted result at 0350, called on call MD.  New orders received and carried out.  Will continue to monitor.

## 2015-11-21 NOTE — Interval H&P Note (Signed)
History and Physical Interval Note:  11/21/2015 4:34 PM  [name]  has presented today for cardiac cath with the diagnosis of CAD with unstable angina, NSTEMI. The various methods of treatment have been discussed with the patient and family. After consideration of risks, benefits and other options for treatment, the patient has consented to  Procedure(s): Left Heart Cath and Coronary Angiography (N/A) as a surgical intervention .  The patient's history has been reviewed, patient examined, no change in status, stable for surgery.  I have reviewed the patient's chart and labs.  Questions were answered to the patient's satisfaction.     Shahidah Nesbitt

## 2015-11-22 ENCOUNTER — Other Ambulatory Visit: Payer: Self-pay

## 2015-11-22 ENCOUNTER — Encounter (HOSPITAL_COMMUNITY): Payer: Self-pay | Admitting: Cardiovascular Disease

## 2015-11-22 DIAGNOSIS — Z720 Tobacco use: Secondary | ICD-10-CM

## 2015-11-22 DIAGNOSIS — R079 Chest pain, unspecified: Secondary | ICD-10-CM

## 2015-11-22 DIAGNOSIS — R001 Bradycardia, unspecified: Secondary | ICD-10-CM | POA: Insufficient documentation

## 2015-11-22 LAB — CBC
HCT: 36.7 % (ref 36.0–46.0)
Hemoglobin: 11.6 g/dL — ABNORMAL LOW (ref 12.0–15.0)
MCH: 30.4 pg (ref 26.0–34.0)
MCHC: 31.6 g/dL (ref 30.0–36.0)
MCV: 96.3 fL (ref 78.0–100.0)
Platelets: 314 10*3/uL (ref 150–400)
RBC: 3.81 MIL/uL — ABNORMAL LOW (ref 3.87–5.11)
RDW: 14.6 % (ref 11.5–15.5)
WBC: 7.1 10*3/uL (ref 4.0–10.5)

## 2015-11-22 LAB — BASIC METABOLIC PANEL
Anion gap: 10 (ref 5–15)
BUN: 11 mg/dL (ref 6–20)
CO2: 28 mmol/L (ref 22–32)
Calcium: 9 mg/dL (ref 8.9–10.3)
Chloride: 104 mmol/L (ref 101–111)
Creatinine, Ser: 1.11 mg/dL — ABNORMAL HIGH (ref 0.44–1.00)
GFR calc Af Amer: >60 mL/min (ref >60)
GFR calc non Af Amer: 52 mL/min — ABNORMAL LOW (ref >60)
Glucose, Bld: 90 mg/dL (ref 65–99)
Potassium: 4.1 mmol/L (ref 3.5–5.1)
Sodium: 142 mmol/L (ref 135–145)

## 2015-11-22 LAB — TROPONIN I: Troponin I: 0.51 ng/mL (ref <0.031)

## 2015-11-22 MED ORDER — ASPIRIN 81 MG PO TBEC: 81.0000 mg | DELAYED_RELEASE_TABLET | Freq: Every day | ORAL | Status: DC

## 2015-11-22 NOTE — Progress Notes (Signed)
CARDIAC REHAB PHASE I   PRE:  Rate/Rhythm: 64 SB  BP:  Sitting: 110/53        SaO2: 98 RA  MODE:  Ambulation: 500 ft   POST:  Rate/Rhythm: 69 SR  BP:  Sitting: 142/67         SaO2: 95 RA  Pt ambulated 500 ft on RA, independent, steady gait, tolerated well with no complaints. Completed MI education.  Reviewed risk factors, tobacco cessation (gave pt fake cigarette), MI book, anti-platelet therapy, activity restrictions, ntg, exercise, heart healthy diet, portion control, and phase 2 cardiac rehab. Pt verbalized understanding, receptive to education. Pt agrees to phase 2 cardiac rehab referral, will send to [place] per pt request. Pt to bed per pt request after walk/up ad lib in room, call bell within reach.  [place], RN, BSN 11/22/2015 10:30 AM

## 2015-11-22 NOTE — Discharge Summary (Signed)
Discharge Summary    Patient ID: [name],  MRN: [id number], DOB/AGE: [birth year] 60 y.o.  Admit date: 11/20/2015 Discharge date: 11/22/2015  Primary Care Provider: Sallee Lange Primary Cardiologist: Dr. Satira Sark  Discharge Diagnoses    Principal Problem:   Chest pain on exertion Active Problems:   Mixed hyperlipidemia   Essential hypertension, benign   CORONARY ATHEROSCLEROSIS NATIVE CORONARY ARTERY   Fibromyalgia   Anxiety and depression   GERD (gastroesophageal reflux disease)   Fatty liver disease, nonalcoholic   Morbid obesity (HCC)   Cervical myelopathy (HCC)   NSTEMI (non-ST elevated myocardial infarction) (HCC)   Elevated troponin   Tobacco abuse   Bradycardia   Allergies Allergies  Allergen Reactions  . Albuterol Other (See Comments)    Tremors  . Cymbalta [Duloxetine Hcl]     Felt bad  . Lyrica [Pregabalin]     Retained fluids    Diagnostic Studies/Procedures    Left Heart Cath and Coronary Angiography    Conclusion     Prox RCA to Dist RCA lesion, 10% stenosed.  Prox Cx lesion, 20% stenosed.  Ost 1st Mrg lesion, 20% stenosed.  There is mild left ventricular systolic dysfunction.  1. Elevated troponin 2. Patent stents in the mid LAD/Diagonal bifurcation. The Diagonal is the larger of the 2 vessels. The LAD becomes a small caliber vessel beyond the stent.  3. Mild disease in the RCA and Circumflex.  4. Mild segmental LV systolic dysfunction (unchanged from LV gram in 2013).  5. No obvious culprit lesions. Elevated troponin possibly due to vasospasm in setting of ongoing tobacco abuse.   Recommendations: Would postpone neck cervical spine surgery given admission with elevated troponin. Plavix has been resumed. Continue ASA, Plavix, beta blocker and statin.    Left Ventricle The left ventricular size is normal. There is mild left ventricular systolic dysfunction. The left ventricular ejection fraction is 45-50%  by visual estimate. There are wall motion abnormalities in the left ventricle. There are segmental wall motion abnormalities in the left ventricle.            History of Present Illness     [name] has a history of CAD status post DES to the LAD and diagonal back in 2005 with nonobstructive circumflex and RCA disease. Cardiac catheterization in 2013 revealed patent stent sites. She has been managed medically and was seen in the office in March for preoperative assessment prior to cervical disc surgery. She stopped Plavix as directed in anticipation of her surgery (to be off at least 7 days).  She was chasing her dog 11/20/15 and developed chest pressure into neck unrelieved with old NTG, lasted 1 hour so called EMS. They gave her a NTG with relief, EKG ok so she decided not to go to ER at [facility]. Several hours later the chest tightness returned at rest so EMS again called and relieved with 2 SL NTG. Troponins trending up to 0.93 this am. EKG without acute change. No further pain since admission. She continues to smoke 1/2 ppd but remains active. The patient was seen by Dr. Domenic Polite and sent to [facility] for cath as Cardiac enzymes suggestive of NSTEMI. Plavix resumed.    Also has HTN, bradycardia-lopressor decreased to 25 mg BID, HLD on statin and ongoing tobacco abuse.    Hospital Course     Consultants: IM as admitting provider at [facility]  NSTEMI: cath showed Patent stents in the mid LAD/Diagonal bifurcation. Mild disease in the RCA  and Circumflex. No obvious culprit lesions. Elevated troponin possibly due to vasospasm in setting of ongoing tobacco abuse. Troponin was trending up 0.04-->0.24-->0.62-->0.93-->0.51. Will get another level to see trend. There is mild left ventricular systolic dysfunction (82-95%) which is unchanged from LV gram in 2013.  Continue ASA, Plavix, beta blocker and statin.   Tobacco smoker: 1/2 ppd-advised to quit. Education given.   Bradycardia: Lopressor recently  decreased to '25mg'$  BID. She was noted to be bradycardic to the upper 40s, but was asymptomatic. Therefore, metoprolol was continued.   Cervical neck pain: surgery scheduled for  11/21/15 cancelled. Postpone neck cervical spine surgery given admission with elevated troponin and clearance given by primary cardiologist.   The patient has been seen by Dr. Oval Linsey today and deemed ready for discharge home. All follow-up appointments have been scheduled. Discharge medications are listed below.    Discharge Vitals Blood pressure 134/67, pulse 46, temperature 98 F (36.7 C), temperature source Oral, resp. rate 20, height '5\' 6"'$  (1.676 m), weight 223 lb 5.2 oz (101.3 kg), SpO2 98 %.  Filed Weights   11/20/15 1743 11/20/15 1920 11/22/15 0430  Weight: 225 lb (102.059 kg) 223 lb 6.4 oz (101.334 kg) 223 lb 5.2 oz (101.3 kg)    Labs & Radiologic Studies     CBC  Recent Labs  11/20/15 1829 11/22/15 0450  WBC 9.6 7.1  HGB 11.7* 11.6*  HCT 35.5* 36.7  MCV 94.7 96.3  PLT 308 621   Basic Metabolic Panel  Recent Labs  11/20/15 1829 11/22/15 0450  NA 141 142  K 4.0 4.1  CL 106 104  CO2 27 28  GLUCOSE 149* 90  BUN 11 11  CREATININE 0.93 1.11*  CALCIUM 8.6* 9.0   Liver Function Tests  Recent Labs  11/20/15 1829  AST 19  ALT 17  ALKPHOS 86  BILITOT 0.2*  PROT 6.8  ALBUMIN 3.1*   No results for input(s): LIPASE, AMYLASE in the last 72 hours. Cardiac Enzymes  Recent Labs  11/21/15 0006 11/21/15 0302 11/22/15 0914  TROPONINI 0.62* 0.93* 0.51*     Ct Abdomen Pelvis W Contrast  10/24/2015  CLINICAL DATA:  Right lower quadrant and low back pain for 5 days EXAM: CT ABDOMEN AND PELVIS WITH CONTRAST TECHNIQUE: Multidetector CT imaging of the abdomen and pelvis was performed using the standard protocol following bolus administration of intravenous contrast. CONTRAST:  124m OMNIPAQUE IOHEXOL 300 MG/ML  SOLN COMPARISON:  None. FINDINGS: Mild scarring is noted at the lung bases.  The liver enhances with no focal abnormality and no ductal dilatation is seen. There is very minimal prominence of intrahepatic ducts of questionable significance and correlation with liver function tests is recommended. No calcified gallstones are seen. The pancreas is normal in size and the pancreatic duct is not dilated. The adrenal glands and spleen are unremarkable. The stomach is not well distended with oral contrast. The kidneys enhance with no calculus or mass, other than a small cyst in the mid periphery of the right kidney. The abdominal aorta is normal in caliber with moderate atherosclerotic change. No adenopathy is seen. The uterus is normal in size. No adnexal lesion is seen. No free fluid is noted within the pelvis. The urinary bladder is moderately well distended with no abnormality noted. There are a few rectosigmoid colon diverticula but no diverticulitis is noted. The terminal ileum is well opacified with oral contrast and appears normal, and the appendix appears normal. No inflammatory process is seen. The lumbar vertebrae are  in normal alignment with normal intervertebral disc spaces. IMPRESSION: 1. No explanation for the patient's right lower quadrant and low back pain is seen. The appendix and terminal ileum are unremarkable. 2. No renal or ureteral calculi are noted. 3. Very minimal prominence of central intrahepatic ducts of questionable significance. Correlate with liver function tests. 4. A few scattered rectosigmoid colon diverticula are noted. Electronically Signed   By: Ivar Drape M.D.   On: 10/24/2015 13:11   Dg Chest Portable 1 View  11/20/2015  CLINICAL DATA:  Encasing dog up a hill and started having chest pain earlier today. EXAM: PORTABLE CHEST 1 VIEW COMPARISON:  11/08/2011. FINDINGS: 1807 hours. Lung volumes are low. Interstitial markings are diffusely coarsened with chronic features. There is pulmonary vascular congestion without overt pulmonary edema. Cardiopericardial  silhouette is at upper limits of normal for size. The visualized bony structures of the thorax are intact. Telemetry leads overlie the chest. IMPRESSION: Low volume film with vascular congestion. Electronically Signed   By: Misty Stanley M.D.   On: 11/20/2015 18:20    Disposition   Pt is being discharged home today in good condition.  Follow-up Plans & Appointments    Follow-up Information    Follow up with Ermalinda Barrios, PA-C. Go on 12/05/2015.   Specialty:  Cardiology   Why:  '@2'$ :00pm for post hospital    Contact information:   [place] [id number] [phone]      Discharge Instructions    Amb Referral to Cardiac Rehabilitation    Complete by:  As directed   Diagnosis:  NSTEMI     Diet - low sodium heart healthy    Complete by:  As directed      Discharge instructions    Complete by:  As directed   NO HEAVY LIFTING (>10lbs) X 2 WEEKS. NO SEXUAL ACTIVITY X 2 WEEKS. NO DRIVING x 5 days NO SOAKING BATHS, HOT TUBS, POOLS, ETC., X 7 DAYS. You may return to work 11/26/15. Resume plavix.     Increase activity slowly    Complete by:  As directed            Discharge Medications   Current Discharge Medication List    CONTINUE these medications which have NOT CHANGED   Details  atorvastatin (LIPITOR) 80 MG tablet Take 1 tablet (80 mg total) by mouth daily. Qty: 90 tablet, Refills: 0    FLUoxetine (PROZAC) 20 MG tablet Take 60 mg by mouth daily.    isosorbide mononitrate (IMDUR) 30 MG 24 hr tablet TAKE (1/2) TABLET BY MOUTH DAILY. Qty: 15 tablet, Refills: 12    levothyroxine (SYNTHROID, LEVOTHROID) 150 MCG tablet TAKE 1 TABLET BY MOUTH ONCE DAILY. PATIENT NEEDS TO BE SEEN. Qty: 30 tablet, Refills: 5    metoprolol tartrate (LOPRESSOR) 25 MG tablet Take 1 tablet (25 mg total) by mouth 2 (two) times daily. Qty: 180 tablet, Refills: 3    nitroGLYCERIN (NITROSTAT) 0.4 MG SL tablet Place 0.4 mg under the tongue every 5 (five) minutes x 3 doses as needed for  chest pain.    pantoprazole (PROTONIX) 40 MG tablet TAKE 1 TABLET DAILY FOR ACID REFLUX. Qty: 30 tablet, Refills: 6    clopidogrel (PLAVIX) 75 MG tablet Take 1 tablet (75 mg total) by mouth daily. Qty: 30 tablet, Refills: 6    levalbuterol (XOPENEX HFA) 45 MCG/ACT inhaler Inhale 2 puffs into the lungs every 4 (four) hours as needed for wheezing. Qty: 1 Inhaler, Refills: 12  Outstanding Labs/Studies   None  Duration of Discharge Encounter   Greater than 30 minutes including physician time.  Signed, Muna Demers PA-C 11/22/2015, 10:31 AM

## 2015-11-22 NOTE — Progress Notes (Signed)
Patient Name: NATHALY DAWKINS Date of Encounter: 11/22/2015   SUBJECTIVE  Feeling well. No chest pain, sob or palpitations.   CURRENT MEDS . aspirin EC  325 mg Oral Daily  . atorvastatin  80 mg Oral q1800  . clopidogrel  75 mg Oral Daily  . FLUoxetine  60 mg Oral Daily  . metoprolol tartrate  25 mg Oral BID  . sodium chloride flush  3 mL Intravenous Q12H    OBJECTIVE  Filed Vitals:   11/21/15 1739 11/21/15 1922 11/22/15 0430 11/22/15 0809  BP: 113/61 105/85 134/67   Pulse: 48 48 46   Temp: 97.7 F (36.5 C) 97.8 F (36.6 C) 98.6 F (37 C) 98 F (36.7 C)  TempSrc: Oral Oral Oral Oral  Resp: '10 15 20   '$ Height:      Weight:   223 lb 5.2 oz (101.3 kg)   SpO2: 98% 98% 98%     Intake/Output Summary (Last 24 hours) at 11/22/15 0858 Last data filed at 11/22/15 0810  Gross per 24 hour  Intake   1530 ml  Output   1151 ml  Net    379 ml   Filed Weights   11/20/15 1743 11/20/15 1920 11/22/15 0430  Weight: 225 lb (102.059 kg) 223 lb 6.4 oz (101.334 kg) 223 lb 5.2 oz (101.3 kg)    PHYSICAL EXAM  General: Pleasant, NAD. Neuro: Alert and oriented X 3. Moves all extremities spontaneously. Psych: Normal affect. HEENT:  Normal  Neck: Supple without bruits or JVD. Lungs:  Resp regular and unlabored, CTA. Heart: RRR no s3, s4, or murmurs. Abdomen: Soft, non-tender, non-distended, BS + x 4.  Extremities: No clubbing, cyanosis or edema. DP/PT/Radials 2+ and equal bilaterally. R radial cath site without hematoma.   Accessory Clinical Findings  CBC  Recent Labs  11/20/15 1829 11/22/15 0450  WBC 9.6 7.1  HGB 11.7* 11.6*  HCT 35.5* 36.7  MCV 94.7 96.3  PLT 308 250   Basic Metabolic Panel  Recent Labs  11/20/15 1829 11/22/15 0450  NA 141 142  K 4.0 4.1  CL 106 104  CO2 27 28  GLUCOSE 149* 90  BUN 11 11  CREATININE 0.93 1.11*  CALCIUM 8.6* 9.0   Liver Function Tests  Recent Labs  11/20/15 1829  AST 19  ALT 17  ALKPHOS 86  BILITOT 0.2*  PROT 6.8    ALBUMIN 3.1*   No results for input(s): LIPASE, AMYLASE in the last 72 hours. Cardiac Enzymes  Recent Labs  11/20/15 2121 11/21/15 0006 11/21/15 0302  TROPONINI 0.24* 0.62* 0.93*    TELE  Sinus rhythm at rate of mid 40s.    Left Heart Cath and Coronary Angiography    Conclusion     Prox RCA to Dist RCA lesion, 10% stenosed.  Prox Cx lesion, 20% stenosed.  Ost 1st Mrg lesion, 20% stenosed.  There is mild left ventricular systolic dysfunction.  1. Elevated troponin 2. Patent stents in the mid LAD/Diagonal bifurcation. The Diagonal is the larger of the 2 vessels. The LAD becomes a small caliber vessel beyond the stent.  3. Mild disease in the RCA and Circumflex.  4. Mild segmental LV systolic dysfunction (unchanged from LV gram in 2013).  5. No obvious culprit lesions. Elevated troponin possibly due to vasospasm in setting of ongoing tobacco abuse.   Recommendations: Would postpone neck cervical spine surgery given admission with elevated troponin. Plavix has been resumed. Continue ASA, Plavix, beta blocker and statin.  Left Ventricle The left ventricular size is normal. There is mild left ventricular systolic dysfunction. The left ventricular ejection fraction is 45-50% by visual estimate. There are wall motion abnormalities in the left ventricle. There are segmental wall motion abnormalities in the left ventricle.        Radiology/Studies  Ct Abdomen Pelvis W Contrast  10/24/2015  CLINICAL DATA:  Right lower quadrant and low back pain for 5 days EXAM: CT ABDOMEN AND PELVIS WITH CONTRAST TECHNIQUE: Multidetector CT imaging of the abdomen and pelvis was performed using the standard protocol following bolus administration of intravenous contrast. CONTRAST:  158m OMNIPAQUE IOHEXOL 300 MG/ML  SOLN COMPARISON:  None. FINDINGS: Mild scarring is noted at the lung bases. The liver enhances with no focal abnormality and no ductal dilatation is seen. There is very  minimal prominence of intrahepatic ducts of questionable significance and correlation with liver function tests is recommended. No calcified gallstones are seen. The pancreas is normal in size and the pancreatic duct is not dilated. The adrenal glands and spleen are unremarkable. The stomach is not well distended with oral contrast. The kidneys enhance with no calculus or mass, other than a small cyst in the mid periphery of the right kidney. The abdominal aorta is normal in caliber with moderate atherosclerotic change. No adenopathy is seen. The uterus is normal in size. No adnexal lesion is seen. No free fluid is noted within the pelvis. The urinary bladder is moderately well distended with no abnormality noted. There are a few rectosigmoid colon diverticula but no diverticulitis is noted. The terminal ileum is well opacified with oral contrast and appears normal, and the appendix appears normal. No inflammatory process is seen. The lumbar vertebrae are in normal alignment with normal intervertebral disc spaces. IMPRESSION: 1. No explanation for the patient's right lower quadrant and low back pain is seen. The appendix and terminal ileum are unremarkable. 2. No renal or ureteral calculi are noted. 3. Very minimal prominence of central intrahepatic ducts of questionable significance. Correlate with liver function tests. 4. A few scattered rectosigmoid colon diverticula are noted. Electronically Signed   By: PIvar DrapeM.D.   On: 10/24/2015 13:11   Dg Chest Portable 1 View  11/20/2015  CLINICAL DATA:  Encasing dog up a hill and started having chest pain earlier today. EXAM: PORTABLE CHEST 1 VIEW COMPARISON:  11/08/2011. FINDINGS: 1807 hours. Lung volumes are low. Interstitial markings are diffusely coarsened with chronic features. There is pulmonary vascular congestion without overt pulmonary edema. Cardiopericardial silhouette is at upper limits of normal for size. The visualized bony structures of the thorax  are intact. Telemetry leads overlie the chest. IMPRESSION: Low volume film with vascular congestion. Electronically Signed   By: EMisty StanleyM.D.   On: 11/20/2015 18:20    ASSESSMENT AND PLAN Principal Problem:   Chest pain on exertion Active Problems:   Mixed hyperlipidemia   Essential hypertension, benign   CORONARY ATHEROSCLEROSIS NATIVE CORONARY ARTERY   Fibromyalgia   Anxiety and depression   GERD (gastroesophageal reflux disease)   Fatty liver disease, nonalcoholic   Morbid obesity (HCC)   Cervical myelopathy (HCC)   NSTEMI (non-ST elevated myocardial infarction) (HCC)   Elevated troponin   NSTEMI: cath showed Patent stents in the mid LAD/Diagonal bifurcation. Mild disease in the RCA and Circumflex. No obvious culprit lesions. Elevated troponin possibly due to vasospasm in setting of ongoing tobacco abuse. Troponin was trending up 0.04-->0.24-->0.62-->0.93. Will get another level to see trend. There is mild left  ventricular systolic dysfunction (24-46%) which is unchanged from LV gram in 2013. -  Continue ASA, Plavix, beta blocker and statin. ? Reduce BB to add amlodipine.   Tobacco smoker: 1/2 ppd-advised to quit. Education given.   Bradycardia: Lopressor recently decreased. Rate in mid 65s. Asymtomatic.  (reduce metoprolol to 12.'5mg'$ ?)  Cervical neck pain: surgery scheduled for yesterday cancelled. Postpone neck cervical spine surgery given admission with elevated troponin and Clearance given by Dr. Harl Bowie.    Dispo: ambulated without chest pain, dizziness or sob. Discharge later today pending troponin level. F/u with Dr. Harl Bowie.   Jarrett Soho PA-C Pager [phone]

## 2015-11-27 ENCOUNTER — Other Ambulatory Visit: Payer: Self-pay | Admitting: Physician Assistant

## 2015-12-05 ENCOUNTER — Ambulatory Visit (INDEPENDENT_AMBULATORY_CARE_PROVIDER_SITE_OTHER): Payer: Medicare Other | Admitting: Physician Assistant

## 2015-12-05 ENCOUNTER — Encounter: Payer: Self-pay | Admitting: Physician Assistant

## 2015-12-05 VITALS — BP 110/66 | HR 58 | Ht 66.0 in | Wt 244.0 lb

## 2015-12-05 DIAGNOSIS — I10 Essential (primary) hypertension: Secondary | ICD-10-CM

## 2015-12-05 DIAGNOSIS — I2510 Atherosclerotic heart disease of native coronary artery without angina pectoris: Secondary | ICD-10-CM | POA: Diagnosis not present

## 2015-12-05 DIAGNOSIS — Z720 Tobacco use: Secondary | ICD-10-CM | POA: Diagnosis not present

## 2015-12-05 DIAGNOSIS — G959 Disease of spinal cord, unspecified: Secondary | ICD-10-CM | POA: Diagnosis not present

## 2015-12-05 NOTE — Patient Instructions (Signed)
Your physician recommends that you schedule a follow-up appointment in: 2 Months with Dr. Domenic Polite  Your physician recommends that you continue on your current medications as directed. Please refer to the Current Medication list given to you today.  If you need a refill on your cardiac medications before your next appointment, please call your pharmacy.  Thank you for choosing [facility]!

## 2015-12-05 NOTE — Progress Notes (Signed)
Cardiology Office Note    Date:  12/05/2015   ID:  [name], DOB 22-Mar-1956, MRN [id number]  PCP:  Sallee Lange, MD  Cardiologist: Dr. Domenic Polite   Chief complaint post hospital follow-up  History of Present Illness:  [name] is a 60 y.o. female patient with history of CAD status post DES to the LAD and diagonal back in 2005 with nonobstructive circumflex and RCA disease. Cardiac catheterization in 2013 revealed patent stent sites. She has been managed medically and was seen in the office in March for preoperative assessment prior to cervical disc surgery. She stopped Plavix as directed in anticipation of her surgery (to be off at least 7 days). She was chasing her dog 11/20/15 and developed chest pressure into neck unrelieved with old NTG, lasted 1 hour so called EMS. She had NSTEMI cardiac cath showed patent stents in the mid LAD/diagonal bifurcation, mild disease in the RCA and circumflex in no obvious culprit lesion. Elevated troponins were possibly due to vasospasm in the setting of ongoing tobacco abuse. There was mild LV dysfunction EF 45-50% which is unchanged from LV gram and 213. Continue aspirin, Plavix, beta blocker and statin. Lopressor was decreased because of bradycardia in the 40s.  Patient comes in today feeling well from a cardiac standpoint. She denies any chest pain, palpitations, dyspnea, dyspnea on exertion, dizziness or presyncope. She has significant pain from her cervical neck says she could have surgery. She has cut back her smoking to one cigarette daily. She is trying hard to quit.    Past Medical History  Diagnosis Date  . Anxiety   . Depression     Prior suicide attempt  . GERD (gastroesophageal reflux disease)   . Hypothyroidism   . Asthma   . Coronary atherosclerosis of native coronary artery     Nonobstructive circ/RCA, DES LAD/diagonal 2005, LVEF 60%  . Palpitations     PVC's have been noted  . NSTEMI (non-ST elevated myocardial  infarction) ([facility]) 2005  . Fibromyalgia   . Essential hypertension, benign   . Sleep apnea   . Arthritis     Past Surgical History  Procedure Laterality Date  . Tubal ligation    . Foot bone excision      Sesmoid bone left foot-repair of fracture  . Carpal tunnel release  2007 and 2008    Bilateral  . Cardiac catheterization    . Coronary angioplasty    . Cardiac catheterization N/A 11/21/2015    Procedure: Left Heart Cath and Coronary Angiography;  Surgeon: Burnell Blanks, MD;  Location: [facility];  Service: Cardiovascular;  Laterality: N/A;    Current Medications: Outpatient Prescriptions Prior to Visit  Medication Sig Dispense Refill  . aspirin EC 81 MG EC tablet Take 1 tablet (81 mg total) by mouth daily.    Marland Kitchen atorvastatin (LIPITOR) 80 MG tablet Take 1 tablet (80 mg total) by mouth daily. 90 tablet 0  . clopidogrel (PLAVIX) 75 MG tablet Take 1 tablet (75 mg total) by mouth daily. 30 tablet 6  . FLUoxetine (PROZAC) 20 MG tablet Take 60 mg by mouth daily.    . isosorbide mononitrate (IMDUR) 30 MG 24 hr tablet TAKE (1/2) TABLET BY MOUTH DAILY. (Patient taking differently: Take 15 mg by mouth daily. ) 15 tablet 12  . levalbuterol (XOPENEX HFA) 45 MCG/ACT inhaler Inhale 2 puffs into the lungs every 4 (four) hours as needed for wheezing. 1 Inhaler 12  . levothyroxine (SYNTHROID, LEVOTHROID) 150  MCG tablet TAKE 1 TABLET BY MOUTH ONCE DAILY. PATIENT NEEDS TO BE SEEN. (Patient taking differently: Take 150 mcg by mouth daily before breakfast. ) 30 tablet 5  . metoprolol tartrate (LOPRESSOR) 25 MG tablet Take 1 tablet (25 mg total) by mouth 2 (two) times daily. 180 tablet 3  . nitroGLYCERIN (NITROSTAT) 0.4 MG SL tablet Place 0.4 mg under the tongue every 5 (five) minutes x 3 doses as needed for chest pain.    Marland Kitchen NITROSTAT 0.4 MG SL tablet PLACE ONE (1) TABLET UNDER TONGUE EVERY 5 MINUTES UP TO (3) DOSES AS NEEDED FOR CHEST PAIN. 25 tablet 3  . pantoprazole (PROTONIX) 40 MG  tablet TAKE 1 TABLET DAILY FOR ACID REFLUX. (Patient taking differently: Take 40 mg by mouth daily. ) 30 tablet 6   No facility-administered medications prior to visit.     Allergies:   Albuterol; Cymbalta; and Lyrica   Social History   Social History  . Marital Status: Divorced    Spouse Name: N/A  . Number of Children: N/A  . Years of Education: N/A   Social History Main Topics  . Smoking status: Current Every Day Smoker -- 0.30 packs/day for 40 years    Types: Cigarettes    Last Attempt to Quit: 08/16/2013  . Smokeless tobacco: Never Used     Comment: 2 ciggs per day  . Alcohol Use: No  . Drug Use: No  . Sexual Activity: Yes    Birth Control/ Protection: Post-menopausal   Other Topics Concern  . None   Social History Narrative     Family History:  The patient's    family history includes Coronary artery disease in her father. There is no history of Anesthesia problems, Hypotension, Malignant hyperthermia, or Pseudochol deficiency.   ROS:   Please see the history of present illness.    Review of Systems  Constitution: Negative.  HENT: Negative.   Cardiovascular: Positive for dyspnea on exertion.  Respiratory: Negative.   Hematologic/Lymphatic: Negative.   Musculoskeletal: Positive for back pain, myalgias, neck pain and stiffness. Negative for joint pain.  Gastrointestinal: Negative.   Genitourinary: Negative.   Neurological: Negative.    All other systems reviewed and are negative.   PHYSICAL EXAM:   VS:  BP 110/66 mmHg  Pulse 58  Ht '5\' 6"'$  (1.676 m)  Wt 244 lb (110.678 kg)  BMI 39.40 kg/m2  SpO2 96%   GEN: Obese, in no acute distress Neck: no JVD, carotid bruits, or masses Cardiac: RRR; no murmurs, rubs, or gallops,no edema right arm at cath site without hematoma or hemorrhage, good radial and brachial pulses Respiratory:  clear to auscultation bilaterally, normal work of breathing GI: soft, nontender, nondistended, + BS MS: no deformity or  atrophy Skin: warm and dry, no rash Neuro:  Alert and Oriented x 3, Strength and sensation are intact Psych: euthymic mood, full affect  Wt Readings from Last 3 Encounters:  12/05/15 244 lb (110.678 kg)  11/22/15 223 lb 5.2 oz (101.3 kg)  11/16/15 218 lb (98.884 kg)      Studies/Labs Reviewed:   EKG:  EKG is not ordered today.   Recent Labs: 10/23/2015: TSH 11.140* 11/20/2015: ALT 17 11/22/2015: BUN 11; Creatinine, Ser 1.11*; Hemoglobin 11.6*; Platelets 314; Potassium 4.1; Sodium 142   Lipid Panel    Component Value Date/Time   CHOL 139 05/03/2015 0825   CHOL 142 11/16/2013 0752   TRIG 117 05/03/2015 0825   HDL 50 05/03/2015 0825   HDL 40 11/16/2013  0752   CHOLHDL 2.8 05/03/2015 0825   CHOLHDL 3.6 11/16/2013 0752   VLDL 24 11/16/2013 0752   LDLCALC 66 05/03/2015 0825   LDLCALC 78 11/16/2013 0752    Additional studies/ records that were reviewed today include:  Left Heart Cath and Coronary Angiography         Conclusion         Prox RCA to Dist RCA lesion, 10% stenosed.  Prox Cx lesion, 20% stenosed.  Ost 1st Mrg lesion, 20% stenosed.  There is mild left ventricular systolic dysfunction.   1. Elevated troponin 2. Patent stents in the mid LAD/Diagonal bifurcation. The Diagonal is the larger of the 2 vessels. The LAD becomes a small caliber vessel beyond the stent.   3. Mild disease in the RCA and Circumflex.   4. Mild segmental LV systolic dysfunction (unchanged from LV gram in 2013).   5. No obvious culprit lesions. Elevated troponin possibly due to vasospasm in setting of ongoing tobacco abuse.   Recommendations: Would postpone neck cervical spine surgery given admission with elevated troponin. Plavix has been resumed. Continue ASA, Plavix, beta blocker and statin.        Left Ventricle The left ventricular size is normal. There is mild left ventricular systolic dysfunction. The left ventricular ejection fraction is 45-50% by visual estimate. There are wall  motion abnormalities in the left ventricle. There are segmental wall motion abnormalities in the left ventricle.                    ASSESSMENT:    1. Atherosclerosis of native coronary artery of native heart without angina pectoris   2. Essential hypertension, benign   3. Tobacco abuse   4. Cervical myelopathy (HCC)      PLAN:  In order of problems listed above:  Patient with recent NSTEMI felt secondary to vasospasm in the setting of ongoing tobacco abuse and chasing her dogs. She had been off Plavix for an impending surgery that was canceled. Continue current medications. Patient is without angina. Metoprolol decreased because of bradycardia. Heart rate 58 today. Follow-up with Dr. Domenic Polite in 2 months.  Hypertension controlled  Tobacco abuse patient is down to 1 cigarette a day. Counseled on the importance of smoking cessation.  Patient has significant cervical neck pain. She is hoping to have surgery in the future but will need to discuss with Dr. Domenic Polite after recent NSTEMI  Medication Adjustments/Labs and Tests Ordered: Current medicines are reviewed at length with the patient today.  Concerns regarding medicines are outlined above.  Medication changes, Labs and Tests ordered today are listed in the Patient Instructions below. There are no Patient Instructions on file for this visit.   Signed, Ermalinda Barrios, PA-C  12/05/2015 2:54 PM    [facility] [address], [place], [place]  [address] Phone: [phone]; Fax: [phone]

## 2015-12-06 ENCOUNTER — Telehealth: Payer: Self-pay | Admitting: Family Medicine

## 2015-12-06 ENCOUNTER — Other Ambulatory Visit: Payer: Self-pay | Admitting: *Deleted

## 2015-12-06 MED ORDER — HYDROCODONE-ACETAMINOPHEN 10-325 MG PO TABS: 1.0000 | ORAL_TABLET | Freq: Three times a day (TID) | ORAL | Status: DC

## 2015-12-06 NOTE — Telephone Encounter (Signed)
Pt is needing a refill on her hydrocodone.

## 2015-12-06 NOTE — Telephone Encounter (Signed)
Hydrocodone 10 mg/325 mg 1 3 times a day when necessary pain #90-follow-up office visit in June if needing ongoing pain medicines

## 2015-12-07 NOTE — Telephone Encounter (Signed)
L[facility] 12/07/15 

## 2015-12-10 NOTE — Telephone Encounter (Signed)
Patient picked up rx for pain medication 12/07/15

## 2015-12-29 ENCOUNTER — Other Ambulatory Visit: Payer: Self-pay | Admitting: Family Medicine

## 2016-01-01 ENCOUNTER — Ambulatory Visit (INDEPENDENT_AMBULATORY_CARE_PROVIDER_SITE_OTHER): Payer: Medicare Other | Admitting: Family Medicine

## 2016-01-01 ENCOUNTER — Encounter: Payer: Self-pay | Admitting: Family Medicine

## 2016-01-01 VITALS — BP 112/80 | Ht 66.0 in | Wt 223.0 lb

## 2016-01-01 DIAGNOSIS — I2510 Atherosclerotic heart disease of native coronary artery without angina pectoris: Secondary | ICD-10-CM | POA: Diagnosis not present

## 2016-01-01 DIAGNOSIS — G959 Disease of spinal cord, unspecified: Secondary | ICD-10-CM

## 2016-01-01 DIAGNOSIS — E038 Other specified hypothyroidism: Secondary | ICD-10-CM | POA: Diagnosis not present

## 2016-01-01 DIAGNOSIS — R7301 Impaired fasting glucose: Secondary | ICD-10-CM | POA: Diagnosis not present

## 2016-01-01 DIAGNOSIS — E782 Mixed hyperlipidemia: Secondary | ICD-10-CM | POA: Diagnosis not present

## 2016-01-01 MED ORDER — HYDROCODONE-ACETAMINOPHEN 10-325 MG PO TABS: ORAL_TABLET | ORAL | Status: DC

## 2016-01-01 MED ORDER — GABAPENTIN 100 MG PO CAPS: 100.0000 mg | ORAL_CAPSULE | Freq: Three times a day (TID) | ORAL | Status: DC

## 2016-01-01 NOTE — Progress Notes (Signed)
   Subjective:    Patient ID: [name], female    DOB: 11/11/1955, 60 y.o.   MRN: [id number]  HPI Patient is here today for a hospital follow up visit. Patient was admitted on 11/20/15 to [facility] for a heart attack. Patient states that she is doing a lot better. No chest pains since discharge.   Patient states that she wants to discuss her pain with the doctor today also. The patient states that she is fearful having any surgery or injections she would just like to use pain medication the manage her pain she does not want to go through any surgery currently. She has significant pain in the neck radiates into the top part of her shoulders sometimes into her arms no weakness just significant pain Review of Systems  Constitutional: Negative for activity change, appetite change and fatigue.  HENT: Negative for congestion.   Respiratory: Negative for cough.   Cardiovascular: Negative for chest pain.  Gastrointestinal: Negative for abdominal pain.  Endocrine: Negative for polydipsia and polyphagia.  Musculoskeletal: Positive for back pain and arthralgias.  Neurological: Negative for weakness.  Psychiatric/Behavioral: Negative for confusion.       Objective:   Physical Exam  Constitutional: She appears well-nourished. No distress.  Cardiovascular: Normal rate, regular rhythm and normal heart sounds.   No murmur heard. Pulmonary/Chest: Effort normal and breath sounds normal. No respiratory distress.  Musculoskeletal: She exhibits no edema.  Lymphadenopathy:    She has no cervical adenopathy.  Neurological: She is alert. She exhibits normal muscle tone.  Psychiatric: Her behavior is normal.  Vitals reviewed.  Review of the recent heart attack review of her neck situation reviewing of chronic pain as well as hypothyroidism and hyperlipidemia required greater than 25 minutes of discussion of these multiple issues discussing the importance of adherence to medical regimen to  lessen the risk of heart attacks and strokes as well as adherence to the pain medication and not abusing the pain medication to lessen the risk of addiction or abuse       Assessment & Plan:  Being treated for hypothyroidism will check TSH await the results of this Patient also with hyperlipidemia Recent MI continue all medications check lipid profile follow-up with cardiology in July  Chronic neck pain 3 prescriptions given she states she will not use more than 2 pain pills per day caution drowsiness not to use with driving if it is causing drowsiness patient to follow-up sooner if any problems otherwise recheck 3 monthsPatient was cautioned not to exceed what was prescribed and not to drive if feeling drowsy

## 2016-01-28 DIAGNOSIS — E038 Other specified hypothyroidism: Secondary | ICD-10-CM | POA: Diagnosis not present

## 2016-01-28 DIAGNOSIS — E782 Mixed hyperlipidemia: Secondary | ICD-10-CM | POA: Diagnosis not present

## 2016-01-28 DIAGNOSIS — R7301 Impaired fasting glucose: Secondary | ICD-10-CM | POA: Diagnosis not present

## 2016-01-29 LAB — LIPID PANEL
Chol/HDL Ratio: 2.7 ratio units (ref 0.0–4.4)
Cholesterol, Total: 122 mg/dL (ref 100–199)
HDL: 45 mg/dL (ref >39)
LDL Calculated: 51 mg/dL (ref 0–99)
Triglycerides: 132 mg/dL (ref 0–149)
VLDL Cholesterol Cal: 26 mg/dL (ref 5–40)

## 2016-01-29 LAB — GLUCOSE, RANDOM: Glucose: 105 mg/dL — ABNORMAL HIGH (ref 65–99)

## 2016-01-29 LAB — TSH: TSH: 0.112 u[IU]/mL — ABNORMAL LOW (ref 0.450–4.500)

## 2016-01-29 LAB — HEPATIC FUNCTION PANEL
ALT: 12 IU/L (ref 0–32)
AST: 19 IU/L (ref 0–40)
Albumin: 3.6 g/dL (ref 3.6–4.8)
Alkaline Phosphatase: 111 IU/L (ref 39–117)
Bilirubin Total: 0.5 mg/dL (ref 0.0–1.2)
Bilirubin, Direct: 0.14 mg/dL (ref 0.00–0.40)
Total Protein: 7.2 g/dL (ref 6.0–8.5)

## 2016-02-01 ENCOUNTER — Other Ambulatory Visit: Payer: Self-pay

## 2016-02-01 DIAGNOSIS — E039 Hypothyroidism, unspecified: Secondary | ICD-10-CM

## 2016-02-01 MED ORDER — LEVOTHYROXINE SODIUM 137 MCG PO CAPS: ORAL_CAPSULE | ORAL | Status: DC

## 2016-02-01 NOTE — Addendum Note (Signed)
Addended by: Ofilia Neas R on: 02/01/2016 09:02 AM   Modules accepted: Orders

## 2016-02-04 ENCOUNTER — Encounter: Payer: Medicare Other | Admitting: Cardiology

## 2016-02-04 ENCOUNTER — Encounter: Payer: Self-pay | Admitting: Cardiology

## 2016-02-04 NOTE — Progress Notes (Signed)
No show  This encounter was created in error - please disregard.

## 2016-02-07 DIAGNOSIS — M25561 Pain in right knee: Secondary | ICD-10-CM | POA: Diagnosis not present

## 2016-02-28 NOTE — Progress Notes (Signed)
Cardiology Office Note  Date: 02/29/2016   ID: [name], DOB Nov 16, 1955, MRN [id number]  PCP: Sallee Lange, MD  Primary Cardiologist: Rozann Lesches, MD   Chief Complaint  Patient presents with  . Coronary Artery Disease    History of Present Illness: [name] is a 60 y.o. female last seen in May by Ms. Bonnell Public PA-C. Patient was hospitalized in April with NSTEMI in the setting of ongoing tobacco abuse, physical exertion (chasing her dog), and the absence of Plavix in preparation for a cervical spine operation. Fortunately, cardiac catheterization demonstrated patent stent sites within the LAD/diagonal bifurcation with only mild atherosclerosis in the circumflex and RCA. There was no obvious culprit lesion and it was postulated that perhaps vasospasm contributed. She was treated medically and doing well the last visit. She was trying to cut back her tobacco use.  She presents today for a follow-up visit, states that she has actually been doing well. She has less pain. She has been using a TENS unit and is also on gabapentin. It does not sound like she is planning to pursue spine surgery at this point.  She does not report significant angina symptoms on medical therapy. I did discuss with her the prior cardiac evaluation, it is not clear that she would never be able to come off Plavix again for a surgery.  She is try to quit smoking by using vapors. No longer smoking cigarettes. Cutting down the nicotine gradually.  Past Medical History:  Diagnosis Date  . Anxiety   . Arthritis   . Asthma   . Coronary atherosclerosis of native coronary artery    Nonobstructive circ/RCA, DES LAD/diagonal 2005, LVEF 60%  . Depression    Prior suicide attempt  . Essential hypertension, benign   . Fibromyalgia   . GERD (gastroesophageal reflux disease)   . Hypothyroidism   . NSTEMI (non-ST elevated myocardial infarction) ([place] [place]) 2005  . Palpitations    PVC's have been noted  .  Sleep apnea     Past Surgical History:  Procedure Laterality Date  . CARDIAC CATHETERIZATION    . CARDIAC CATHETERIZATION N/A 11/21/2015   Procedure: Left Heart Cath and Coronary Angiography;  Surgeon: Burnell Blanks, MD;  Location: [facility];  Service: Cardiovascular;  Laterality: N/A;  . CARPAL TUNNEL RELEASE  2007 and 2008   Bilateral  . CORONARY ANGIOPLASTY    . FOOT BONE EXCISION     Sesmoid bone left foot-repair of fracture  . TUBAL LIGATION      Current Outpatient Prescriptions  Medication Sig Dispense Refill  . aspirin EC 81 MG EC tablet Take 1 tablet (81 mg total) by mouth daily.    Marland Kitchen atorvastatin (LIPITOR) 80 MG tablet TAKE (1) TABLET BY MOUTH ONCE DAILY. 30 tablet 5  . clopidogrel (PLAVIX) 75 MG tablet TAKE 1 TABLET ONCE DAILY. 30 tablet 5  . FLUoxetine (PROZAC) 20 MG tablet Take 60 mg by mouth daily.    Marland Kitchen gabapentin (NEURONTIN) 100 MG capsule Take 1 capsule (100 mg total) by mouth 3 (three) times daily. 90 capsule 6  . HYDROcodone-acetaminophen (NORCO) 10-325 MG tablet 1 bid prn 60 tablet 0  . isosorbide mononitrate (IMDUR) 30 MG 24 hr tablet TAKE (1/2) TABLET BY MOUTH DAILY. (Patient taking differently: Take 15 mg by mouth daily. ) 15 tablet 12  . levalbuterol (XOPENEX HFA) 45 MCG/ACT inhaler Inhale 2 puffs into the lungs every 4 (four) hours as needed for wheezing. 1 Inhaler 12  .  Levothyroxine Sodium 137 MCG CAPS Take 1 tablet by mouth daily. 30 capsule 5  . metoprolol tartrate (LOPRESSOR) 25 MG tablet Take 1 tablet (25 mg total) by mouth 2 (two) times daily. 180 tablet 3  . NITROSTAT 0.4 MG SL tablet PLACE ONE (1) TABLET UNDER TONGUE EVERY 5 MINUTES UP TO (3) DOSES AS NEEDED FOR CHEST PAIN. 25 tablet 3  . pantoprazole (PROTONIX) 40 MG tablet TAKE 1 TABLET DAILY FOR ACID REFLUX. 30 tablet 5   No current facility-administered medications for this visit.    Allergies:  Albuterol; Cymbalta [duloxetine hcl]; and Lyrica [pregabalin]   Social History: The  patient  reports that she quit smoking about 3 months ago. Her smoking use included Cigarettes. She has a 12.00 pack-year smoking history. She uses smokeless tobacco. She reports that she does not drink alcohol or use drugs.   ROS:  Please see the history of present illness. Otherwise, complete review of systems is positive for foot pain, back pain.  All other systems are reviewed and negative.   Physical Exam: VS:  BP 109/75   Pulse (!) 58   Ht '5\' 6"'$  (1.676 m)   Wt 227 lb (103 kg)   SpO2 96%   BMI 36.64 kg/m , BMI Body mass index is 36.64 kg/m.  Wt Readings from Last 3 Encounters:  02/29/16 227 lb (103 kg)  01/01/16 223 lb (101.2 kg)  12/05/15 244 lb (110.7 kg)    Overweight woman, appears comfortable.  HEENT: Conjunctiva and lids normal, oropharynx clear.  Neck: Supple, no elevated JVP or carotid bruits, no thyromegaly.  Lungs: Clear to auscultation, nonlabored breathing at rest.  Cardiac: Regular rate and rhythm, no S3 or significant systolic murmur, no pericardial rub.  Abdomen: Soft, nontender, bowel sounds present.  Extremities: No pitting edema, distal pulses 2+.  Skin: Warm and dry. Tattoos extending up both arms.  Musculoskeletal: No kyphosis.  Neuropsychiatric: Alert and oriented x3, affect grossly appropriate.  ECG: I personally reviewed the tracing from 11/22/2015 which showed sinus bradycardia with anteroseptal Q waves.  Recent Labwork: 11/22/2015: BUN 11; Creatinine, Ser 1.11; Hemoglobin 11.6; Platelets 314; Potassium 4.1; Sodium 142 01/28/2016: ALT 12; AST 19; TSH 0.112     Component Value Date/Time   CHOL 122 01/28/2016 0913   TRIG 132 01/28/2016 0913   HDL 45 01/28/2016 0913   CHOLHDL 2.7 01/28/2016 0913   CHOLHDL 3.6 11/16/2013 0752   VLDL 24 11/16/2013 0752   LDLCALC 51 01/28/2016 0913    Other Studies Reviewed Today:  Cardiac catheterization 11/21/2015:  Prox RCA to Dist RCA lesion, 10% stenosed.  Prox Cx lesion, 20% stenosed.  Ost 1st  Mrg lesion, 20% stenosed.  There is mild left ventricular systolic dysfunction.   1. Elevated troponin 2. Patent stents in the mid LAD/Diagonal bifurcation. The Diagonal is the larger of the 2 vessels. The LAD becomes a small caliber vessel beyond the stent.  3. Mild disease in the RCA and Circumflex.  4. Mild segmental LV systolic dysfunction (unchanged from LV gram in 2013).  5. No obvious culprit lesions. Elevated troponin possibly due to vasospasm in setting of ongoing tobacco abuse.   Assessment and Plan:  1. Symptomatic stable CAD on medical therapy. She has history of DES to the LAD/diagonal in 2005, recently patent at angiography. Not entirely clear that being off Plavix in anticipation of a spine surgery was the true culprit for her NSTEMI since there was no new stenosis or plaque rupture event. This could have been  vasospastic. For now we will continue medical therapy and observation.  2. Tobacco abuse, trying to quit completely.  3. Essential hypertension, blood pressure is well controlled today.  4. Hyperlipidemia, on Lipitor with LDL 51.  Current medicines were reviewed with the patient today.  Disposition: Follow-up with me in 6 months.  Signed, Satira Sark, MD, [facility] 02/29/2016 9:28 AM    [facility] [place], [place], [place] [address] Phone: [phone]; Fax: [phone]

## 2016-02-29 ENCOUNTER — Ambulatory Visit (INDEPENDENT_AMBULATORY_CARE_PROVIDER_SITE_OTHER): Payer: Medicare Other | Admitting: Cardiology

## 2016-02-29 ENCOUNTER — Encounter: Payer: Self-pay | Admitting: Cardiology

## 2016-02-29 VITALS — BP 109/75 | HR 58 | Ht 66.0 in | Wt 227.0 lb

## 2016-02-29 DIAGNOSIS — Z720 Tobacco use: Secondary | ICD-10-CM | POA: Diagnosis not present

## 2016-02-29 DIAGNOSIS — I2510 Atherosclerotic heart disease of native coronary artery without angina pectoris: Secondary | ICD-10-CM | POA: Diagnosis not present

## 2016-02-29 DIAGNOSIS — E782 Mixed hyperlipidemia: Secondary | ICD-10-CM | POA: Diagnosis not present

## 2016-02-29 DIAGNOSIS — I10 Essential (primary) hypertension: Secondary | ICD-10-CM | POA: Diagnosis not present

## 2016-02-29 NOTE — Patient Instructions (Addendum)

## 2016-06-05 ENCOUNTER — Ambulatory Visit (INDEPENDENT_AMBULATORY_CARE_PROVIDER_SITE_OTHER): Payer: Medicare Other | Admitting: Orthopaedic Surgery

## 2016-06-05 ENCOUNTER — Telehealth: Payer: Self-pay | Admitting: Family Medicine

## 2016-06-05 DIAGNOSIS — I10 Essential (primary) hypertension: Secondary | ICD-10-CM

## 2016-06-05 DIAGNOSIS — E785 Hyperlipidemia, unspecified: Secondary | ICD-10-CM

## 2016-06-05 DIAGNOSIS — D649 Anemia, unspecified: Secondary | ICD-10-CM

## 2016-06-05 DIAGNOSIS — E039 Hypothyroidism, unspecified: Secondary | ICD-10-CM

## 2016-06-05 NOTE — Telephone Encounter (Signed)
Pt needs refill on her HYDROcodone-acetaminophen (NORCO) 10-325 MG tablet  Pt also needs lab orders to recheck her thyroid and any other labs Dr. Nicki Reaper feels pt may need checked  Pt has appt here 06/24/16    Please call when done

## 2016-06-05 NOTE — Telephone Encounter (Signed)
Lipid, liver, metabolic 7, TSH, CBC-anemia, hypothyroidism, hyperlipidemia, hypertension may have one refill on pain medication keep follow-up appointment

## 2016-06-06 ENCOUNTER — Other Ambulatory Visit: Payer: Self-pay | Admitting: Family Medicine

## 2016-06-06 MED ORDER — HYDROCODONE-ACETAMINOPHEN 10-325 MG PO TABS: ORAL_TABLET | ORAL | 0 refills | Status: DC

## 2016-06-06 NOTE — Telephone Encounter (Signed)
Left message return call (labs ordered and prescription already printed) 06/06/16

## 2016-06-06 NOTE — Telephone Encounter (Signed)
Patient notified that bloodwork has been ordered and script ready for pickup.

## 2016-06-19 DIAGNOSIS — I10 Essential (primary) hypertension: Secondary | ICD-10-CM | POA: Diagnosis not present

## 2016-06-19 DIAGNOSIS — D649 Anemia, unspecified: Secondary | ICD-10-CM | POA: Diagnosis not present

## 2016-06-19 DIAGNOSIS — E039 Hypothyroidism, unspecified: Secondary | ICD-10-CM | POA: Diagnosis not present

## 2016-06-19 DIAGNOSIS — E785 Hyperlipidemia, unspecified: Secondary | ICD-10-CM | POA: Diagnosis not present

## 2016-06-20 ENCOUNTER — Other Ambulatory Visit: Payer: Self-pay

## 2016-06-20 LAB — CBC WITH DIFFERENTIAL/PLATELET
Basophils Absolute: 0.1 10*3/uL (ref 0.0–0.2)
Basos: 1 %
EOS (ABSOLUTE): 0.6 10*3/uL — ABNORMAL HIGH (ref 0.0–0.4)
Eos: 8 %
Hematocrit: 35.6 % (ref 34.0–46.6)
Hemoglobin: 11.9 g/dL (ref 11.1–15.9)
Immature Grans (Abs): 0 10*3/uL (ref 0.0–0.1)
Immature Granulocytes: 0 %
Lymphocytes Absolute: 2.6 10*3/uL (ref 0.7–3.1)
Lymphs: 34 %
MCH: 29.9 pg (ref 26.6–33.0)
MCHC: 33.4 g/dL (ref 31.5–35.7)
MCV: 89 fL (ref 79–97)
Monocytes Absolute: 0.8 10*3/uL (ref 0.1–0.9)
Monocytes: 10 %
Neutrophils Absolute: 3.7 10*3/uL (ref 1.4–7.0)
Neutrophils: 47 %
Platelets: 303 10*3/uL (ref 150–379)
RBC: 3.98 x10E6/uL (ref 3.77–5.28)
RDW: 14.3 % (ref 12.3–15.4)
WBC: 7.8 10*3/uL (ref 3.4–10.8)

## 2016-06-20 LAB — HEPATIC FUNCTION PANEL
ALT: 16 IU/L (ref 0–32)
AST: 19 IU/L (ref 0–40)
Albumin: 3.4 g/dL — ABNORMAL LOW (ref 3.6–4.8)
Alkaline Phosphatase: 124 IU/L — ABNORMAL HIGH (ref 39–117)
Bilirubin Total: 0.4 mg/dL (ref 0.0–1.2)
Bilirubin, Direct: 0.13 mg/dL (ref 0.00–0.40)
Total Protein: 7.1 g/dL (ref 6.0–8.5)

## 2016-06-20 LAB — LIPID PANEL
Chol/HDL Ratio: 2.5 ratio units (ref 0.0–4.4)
Cholesterol, Total: 119 mg/dL (ref 100–199)
HDL: 48 mg/dL (ref >39)
LDL Calculated: 52 mg/dL (ref 0–99)
Triglycerides: 97 mg/dL (ref 0–149)
VLDL Cholesterol Cal: 19 mg/dL (ref 5–40)

## 2016-06-20 LAB — BASIC METABOLIC PANEL
BUN/Creatinine Ratio: 15 (ref 12–28)
BUN: 16 mg/dL (ref 8–27)
CO2: 28 mmol/L (ref 18–29)
Calcium: 9.4 mg/dL (ref 8.7–10.3)
Chloride: 101 mmol/L (ref 96–106)
Creatinine, Ser: 1.05 mg/dL — ABNORMAL HIGH (ref 0.57–1.00)
GFR calc Af Amer: 66 mL/min/{1.73_m2} (ref >59)
GFR calc non Af Amer: 57 mL/min/{1.73_m2} — ABNORMAL LOW (ref >59)
Glucose: 97 mg/dL (ref 65–99)
Potassium: 4.7 mmol/L (ref 3.5–5.2)
Sodium: 142 mmol/L (ref 134–144)

## 2016-06-20 LAB — TSH: TSH: 0.047 u[IU]/mL — ABNORMAL LOW (ref 0.450–4.500)

## 2016-06-20 MED ORDER — LEVOTHYROXINE SODIUM 137 MCG PO CAPS: ORAL_CAPSULE | ORAL | 0 refills | Status: DC

## 2016-06-20 MED ORDER — PANTOPRAZOLE SODIUM 40 MG PO TBEC: DELAYED_RELEASE_TABLET | ORAL | 0 refills | Status: DC

## 2016-06-20 MED ORDER — ATORVASTATIN CALCIUM 80 MG PO TABS: ORAL_TABLET | ORAL | 0 refills | Status: DC

## 2016-06-20 MED ORDER — ISOSORBIDE MONONITRATE ER 30 MG PO TB24: ORAL_TABLET | ORAL | 0 refills | Status: DC

## 2016-06-20 MED ORDER — CLOPIDOGREL BISULFATE 75 MG PO TABS: 75.0000 mg | ORAL_TABLET | Freq: Every day | ORAL | 0 refills | Status: DC

## 2016-06-20 MED ORDER — GABAPENTIN 100 MG PO CAPS: 100.0000 mg | ORAL_CAPSULE | Freq: Three times a day (TID) | ORAL | 0 refills | Status: DC

## 2016-06-24 ENCOUNTER — Other Ambulatory Visit: Payer: Self-pay | Admitting: *Deleted

## 2016-06-24 ENCOUNTER — Ambulatory Visit (INDEPENDENT_AMBULATORY_CARE_PROVIDER_SITE_OTHER): Payer: Commercial Managed Care - HMO | Admitting: Family Medicine

## 2016-06-24 ENCOUNTER — Telehealth: Payer: Self-pay | Admitting: *Deleted

## 2016-06-24 ENCOUNTER — Encounter: Payer: Self-pay | Admitting: Family Medicine

## 2016-06-24 VITALS — BP 122/82 | Ht 66.0 in | Wt 223.8 lb

## 2016-06-24 DIAGNOSIS — K219 Gastro-esophageal reflux disease without esophagitis: Secondary | ICD-10-CM

## 2016-06-24 DIAGNOSIS — E782 Mixed hyperlipidemia: Secondary | ICD-10-CM

## 2016-06-24 DIAGNOSIS — G959 Disease of spinal cord, unspecified: Secondary | ICD-10-CM | POA: Diagnosis not present

## 2016-06-24 DIAGNOSIS — I10 Essential (primary) hypertension: Secondary | ICD-10-CM | POA: Diagnosis not present

## 2016-06-24 DIAGNOSIS — E038 Other specified hypothyroidism: Secondary | ICD-10-CM | POA: Diagnosis not present

## 2016-06-24 MED ORDER — ATORVASTATIN CALCIUM 80 MG PO TABS: ORAL_TABLET | ORAL | 1 refills | Status: DC

## 2016-06-24 MED ORDER — LEVOTHYROXINE SODIUM 137 MCG PO CAPS: ORAL_CAPSULE | ORAL | 1 refills | Status: DC

## 2016-06-24 MED ORDER — RANITIDINE HCL 300 MG PO TABS: 300.0000 mg | ORAL_TABLET | Freq: Every day | ORAL | 5 refills | Status: DC

## 2016-06-24 MED ORDER — HYDROCODONE-ACETAMINOPHEN 10-325 MG PO TABS: ORAL_TABLET | ORAL | 0 refills | Status: DC

## 2016-06-24 MED ORDER — ISOSORBIDE MONONITRATE ER 30 MG PO TB24: ORAL_TABLET | ORAL | 1 refills | Status: DC

## 2016-06-24 MED ORDER — CLOPIDOGREL BISULFATE 75 MG PO TABS: 75.0000 mg | ORAL_TABLET | Freq: Every day | ORAL | 1 refills | Status: DC

## 2016-06-24 MED ORDER — METOPROLOL TARTRATE 25 MG PO TABS
25.0000 mg | ORAL_TABLET | Freq: Two times a day (BID) | ORAL | 0 refills | Status: DC
Start: 2016-06-24 — End: 2016-09-22

## 2016-06-24 MED ORDER — GABAPENTIN 100 MG PO CAPS: 100.0000 mg | ORAL_CAPSULE | Freq: Three times a day (TID) | ORAL | 0 refills | Status: DC

## 2016-06-24 MED ORDER — CLOPIDOGREL BISULFATE 75 MG PO TABS: 75.0000 mg | ORAL_TABLET | Freq: Every day | ORAL | 0 refills | Status: DC

## 2016-06-24 MED ORDER — LEVOTHYROXINE SODIUM 125 MCG PO TABS: 125.0000 ug | ORAL_TABLET | Freq: Every day | ORAL | 3 refills | Status: DC

## 2016-06-24 MED ORDER — FLUOXETINE HCL 20 MG PO CAPS: 60.0000 mg | ORAL_CAPSULE | Freq: Every day | ORAL | 1 refills | Status: DC

## 2016-06-24 MED ORDER — RANITIDINE HCL 300 MG PO TABS
300.0000 mg | ORAL_TABLET | Freq: Every day | ORAL | 5 refills | Status: DC
Start: 2016-06-24 — End: 2016-07-08

## 2016-06-24 NOTE — Progress Notes (Signed)
   Subjective:    Patient ID: [name], female    DOB: 06/26/1956, 60 y.o.   MRN: [id number]  Hypertension  This is a chronic problem. The current episode started more than 1 year ago. Pertinent negatives include no chest pain, headaches or shortness of breath. Risk factors for coronary artery disease include dyslipidemia, post-menopausal state and sedentary lifestyle. Treatments tried: metoprolol. There are no compliance problems.   We reviewed over her lab work in detail Her thyroid she states she's taken her medicine denies any problem with it States her energy level okay Relates she tries watch her fat diet She is on medication to help prevent heart attack. She states no chest tightness pressure pain recently She does relate intermittent abdominal pain that happened over the weekend but now it's gone. Denies any vomiting diarrhea. She is concerned about PPIs. She does take pain medication for neck issues as well as impingement issues    Review of Systems  Constitutional: Negative for activity change, fatigue and fever.  HENT: Negative for congestion.   Respiratory: Negative for cough and shortness of breath.   Cardiovascular: Negative for chest pain and leg swelling.  Gastrointestinal: Positive for abdominal pain. Negative for diarrhea, nausea and vomiting.  Musculoskeletal: Positive for arthralgias and back pain.  Neurological: Negative for headaches.       Objective:   Physical Exam  Constitutional: She appears well-nourished. No distress.  Cardiovascular: Normal rate, regular rhythm and normal heart sounds.   No murmur heard. Pulmonary/Chest: Effort normal and breath sounds normal. No respiratory distress.  Musculoskeletal: She exhibits no edema.  Lymphadenopathy:    She has no cervical adenopathy.  Neurological: She is alert. She exhibits normal muscle tone.  Psychiatric: Her behavior is normal.  Vitals reviewed.         Assessment & Plan:  Heart  disease stable continue current medicines  HTN decent control continue current measures watch diet try to lose weight  Hyperlipidemia labs reviewed looks good continue current medication  Hypothyroidism reduce the dose of the thyroid medicine labs reviewed with patient  Chronic neck and back pain with impingement recommend continuation of pain medicine patient does not abuse it 3 prescriptions were written follow-up in approximate 3 months  Patient with intermittent abdominal pain last weekend has gone away if she has return of this this will need further workup.  Switch her medication to ranitidine to try to get away from potential side effects of PPI  Patient with concerns of cognitive dysfunction but she passes the mini cognitive tests without difficulty reassurance given but discussion regarding what warning signs to watch for  Follow-up proximal to 3 months

## 2016-06-24 NOTE — Telephone Encounter (Signed)
Medication cancelled

## 2016-06-24 NOTE — Telephone Encounter (Signed)
Please send [employer] note that pantoprazole was canceled

## 2016-06-24 NOTE — Telephone Encounter (Signed)
Fax from [employer] requesting refill on pantoproazole. Med not on med list. Pt seen today. Med list has ranitidine on it.

## 2016-07-08 ENCOUNTER — Other Ambulatory Visit: Payer: Self-pay

## 2016-07-08 MED ORDER — ISOSORBIDE MONONITRATE ER 30 MG PO TB24: ORAL_TABLET | ORAL | 1 refills | Status: DC

## 2016-07-08 MED ORDER — CLOPIDOGREL BISULFATE 75 MG PO TABS: 75.0000 mg | ORAL_TABLET | Freq: Every day | ORAL | 1 refills | Status: DC

## 2016-07-08 MED ORDER — ATORVASTATIN CALCIUM 80 MG PO TABS: ORAL_TABLET | ORAL | 1 refills | Status: DC

## 2016-07-08 MED ORDER — GABAPENTIN 100 MG PO CAPS: 100.0000 mg | ORAL_CAPSULE | Freq: Three times a day (TID) | ORAL | 0 refills | Status: DC

## 2016-07-08 MED ORDER — LEVOTHYROXINE SODIUM 125 MCG PO TABS: 125.0000 ug | ORAL_TABLET | Freq: Every day | ORAL | 3 refills | Status: DC

## 2016-07-08 MED ORDER — RANITIDINE HCL 300 MG PO TABS: 300.0000 mg | ORAL_TABLET | Freq: Every day | ORAL | 1 refills | Status: DC

## 2016-09-22 ENCOUNTER — Other Ambulatory Visit: Payer: Self-pay | Admitting: Cardiology

## 2016-09-22 ENCOUNTER — Other Ambulatory Visit: Payer: Self-pay | Admitting: Family Medicine

## 2016-10-10 ENCOUNTER — Other Ambulatory Visit: Payer: Self-pay | Admitting: Family Medicine

## 2016-10-21 ENCOUNTER — Encounter (INDEPENDENT_AMBULATORY_CARE_PROVIDER_SITE_OTHER): Payer: Self-pay | Admitting: *Deleted

## 2016-10-21 ENCOUNTER — Ambulatory Visit (INDEPENDENT_AMBULATORY_CARE_PROVIDER_SITE_OTHER): Payer: Medicare HMO | Admitting: Family Medicine

## 2016-10-21 ENCOUNTER — Encounter: Payer: Self-pay | Admitting: Family Medicine

## 2016-10-21 VITALS — BP 120/70 | Ht 66.0 in | Wt 228.1 lb

## 2016-10-21 DIAGNOSIS — I10 Essential (primary) hypertension: Secondary | ICD-10-CM | POA: Diagnosis not present

## 2016-10-21 DIAGNOSIS — E782 Mixed hyperlipidemia: Secondary | ICD-10-CM | POA: Diagnosis not present

## 2016-10-21 DIAGNOSIS — Z114 Encounter for screening for human immunodeficiency virus [HIV]: Secondary | ICD-10-CM | POA: Diagnosis not present

## 2016-10-21 DIAGNOSIS — K219 Gastro-esophageal reflux disease without esophagitis: Secondary | ICD-10-CM | POA: Diagnosis not present

## 2016-10-21 DIAGNOSIS — E038 Other specified hypothyroidism: Secondary | ICD-10-CM | POA: Diagnosis not present

## 2016-10-21 DIAGNOSIS — Z1211 Encounter for screening for malignant neoplasm of colon: Secondary | ICD-10-CM | POA: Diagnosis not present

## 2016-10-21 DIAGNOSIS — Z1231 Encounter for screening mammogram for malignant neoplasm of breast: Secondary | ICD-10-CM

## 2016-10-21 DIAGNOSIS — Z1159 Encounter for screening for other viral diseases: Secondary | ICD-10-CM

## 2016-10-21 MED ORDER — NITROGLYCERIN 0.4 MG SL SUBL
SUBLINGUAL_TABLET | SUBLINGUAL | 3 refills | Status: DC
Start: 2016-10-21 — End: 2018-01-14

## 2016-10-21 MED ORDER — HYDROCODONE-ACETAMINOPHEN 10-325 MG PO TABS: ORAL_TABLET | ORAL | 0 refills | Status: DC

## 2016-10-21 MED ORDER — FLUOXETINE HCL 20 MG PO CAPS: 40.0000 mg | ORAL_CAPSULE | Freq: Every day | ORAL | 2 refills | Status: DC

## 2016-10-21 MED ORDER — ATORVASTATIN CALCIUM 80 MG PO TABS: ORAL_TABLET | ORAL | 2 refills | Status: DC

## 2016-10-21 MED ORDER — CLOPIDOGREL BISULFATE 75 MG PO TABS: 75.0000 mg | ORAL_TABLET | Freq: Every day | ORAL | 2 refills | Status: DC

## 2016-10-21 MED ORDER — METOPROLOL TARTRATE 25 MG PO TABS: ORAL_TABLET | ORAL | 1 refills | Status: DC

## 2016-10-21 MED ORDER — ISOSORBIDE MONONITRATE ER 30 MG PO TB24: ORAL_TABLET | ORAL | 2 refills | Status: DC

## 2016-10-21 MED ORDER — GABAPENTIN 100 MG PO CAPS: ORAL_CAPSULE | ORAL | 1 refills | Status: DC

## 2016-10-21 MED ORDER — PANTOPRAZOLE SODIUM 40 MG PO TBEC: DELAYED_RELEASE_TABLET | ORAL | 2 refills | Status: DC

## 2016-10-21 NOTE — Progress Notes (Signed)
   Subjective:    Patient ID: [name], female    DOB: 27-May-1956, 61 y.o.   MRN: [id number]  Hyperlipidemia  This is a chronic problem. The current episode started more than 1 year ago. Pertinent negatives include no chest pain. There are no compliance problems.   Previous CBC looked good Kidney function good sodium-potassium good Thyroid will need to be repeated Recent lipid profile looked good Patient would like to discuss medications.  Patient taking Plavix tolerating well Taking Prozac tolerating well Is on new dose of thyroid states energy level good States depression is under good control Takes cholesterol medicine no problems with it previous labs reviewed Takes gabapentin helps with neuropathic pain into the leg Uses hydrocodone intermittently for pain anywhere between 1 or 2 in a day does not abuse it does help with function States no chest tightness pressure pain or cardiac symptoms Review of Systems  Constitutional: Negative for activity change, appetite change and fatigue.  HENT: Negative for congestion.   Respiratory: Negative for cough.   Cardiovascular: Negative for chest pain.  Gastrointestinal: Negative for abdominal pain and vomiting.  Endocrine: Negative for polydipsia and polyphagia.  Neurological: Negative for weakness.  Psychiatric/Behavioral: Negative for confusion.       Objective:   Physical Exam  Constitutional: She appears well-nourished. No distress.  HENT:  Head: Normocephalic.  Cardiovascular: Normal rate, regular rhythm and normal heart sounds.   No murmur heard. Pulmonary/Chest: Effort normal and breath sounds normal. No respiratory distress.  Musculoskeletal: She exhibits no edema.  Lymphadenopathy:    She has no cervical adenopathy.  Neurological: She is alert. She exhibits normal muscle tone.  Psychiatric: Her behavior is normal.  Vitals reviewed.         Assessment & Plan:  Hyperlipidemia-continue cholesterol medicine  previous labs reviewed.  HTN good control blood pressure good continue current medicine  Reflux-patient states has to take PPI tried H2 blocker did not help enough  Hypothyroidism previous labs reviewed continue current meds check new lab work  Screening for hep C and HIV per CDC guideline  Colonoscopy screening gastroenterology Dr.Rehman per patient request  Mammogram ordered  Patient to follow-up in 3-5 months when her pain medicine is running out  Chronic back pain and foot pain uses hydrocodone sparingly does not abuse it does help with function. 3 prescriptions given.  Patient states she's doing to Prozac per day no moods are doing good

## 2016-11-03 DIAGNOSIS — Z1159 Encounter for screening for other viral diseases: Secondary | ICD-10-CM | POA: Diagnosis not present

## 2016-11-03 DIAGNOSIS — E038 Other specified hypothyroidism: Secondary | ICD-10-CM | POA: Diagnosis not present

## 2016-11-03 DIAGNOSIS — Z114 Encounter for screening for human immunodeficiency virus [HIV]: Secondary | ICD-10-CM | POA: Diagnosis not present

## 2016-11-04 ENCOUNTER — Encounter: Payer: Self-pay | Admitting: Family Medicine

## 2016-11-04 LAB — HIV ANTIBODY (ROUTINE TESTING W REFLEX): HIV Screen 4th Generation wRfx: NONREACTIVE

## 2016-11-04 LAB — HEPATITIS C ANTIBODY: Hep C Virus Ab: <0.1 s/co ratio (ref 0.0–0.9)

## 2016-11-04 LAB — T4, FREE: Free T4: 1.28 ng/dL (ref 0.82–1.77)

## 2016-11-04 LAB — TSH: TSH: 0.656 u[IU]/mL (ref 0.450–4.500)

## 2016-11-13 ENCOUNTER — Encounter: Payer: Self-pay | Admitting: Cardiology

## 2016-11-13 ENCOUNTER — Ambulatory Visit (INDEPENDENT_AMBULATORY_CARE_PROVIDER_SITE_OTHER): Payer: Medicare HMO | Admitting: Cardiology

## 2016-11-13 VITALS — BP 100/70 | HR 58 | Ht 66.0 in | Wt 222.0 lb

## 2016-11-13 DIAGNOSIS — I2510 Atherosclerotic heart disease of native coronary artery without angina pectoris: Secondary | ICD-10-CM | POA: Diagnosis not present

## 2016-11-13 DIAGNOSIS — I10 Essential (primary) hypertension: Secondary | ICD-10-CM | POA: Diagnosis not present

## 2016-11-13 DIAGNOSIS — E782 Mixed hyperlipidemia: Secondary | ICD-10-CM | POA: Diagnosis not present

## 2016-11-13 DIAGNOSIS — Z720 Tobacco use: Secondary | ICD-10-CM | POA: Diagnosis not present

## 2016-11-13 NOTE — Progress Notes (Signed)
Cardiology Office Note  Date: 11/13/2016   ID: DEREONNA LENSING, DOB October 04, 1955, MRN [id number]  PCP: Sallee Lange, MD  Primary Cardiologist: Rozann Lesches, MD   Chief Complaint  Patient presents with  . Coronary Artery Disease    History of Present Illness: [name] is a 61 y.o. female last seen in July 2017. She presents for a routine follow-up visit. Since last encounter she does not report any significant angina symptoms or increasing nitroglycerin requirement. She reports compliance with her medications.  Current cardiac regimen includes aspirin, Plavix, Lipitor, Imdur, Lopressor, and as needed nitroglycerin.  I reviewed her most recent lipid panel which is outlined below. LDL 52. She is on Lipitor.  I personally reviewed her ECG today which shows sinus rhythm with poor R wave progression.  She states that she is not smoking cigarettes now, has been vaping instead.  Past Medical History:  Diagnosis Date  . Anxiety   . Arthritis   . Asthma   . Coronary atherosclerosis of native coronary artery    Nonobstructive circ/RCA, DES LAD/diagonal 2005, LVEF 60%  . Depression    Prior suicide attempt  . Essential hypertension, benign   . Fibromyalgia   . GERD (gastroesophageal reflux disease)   . Hypothyroidism   . NSTEMI (non-ST elevated myocardial infarction) ([place]) 2005  . Palpitations    PVC's have been noted  . Sleep apnea     Past Surgical History:  Procedure Laterality Date  . CARDIAC CATHETERIZATION    . CARDIAC CATHETERIZATION N/A 11/21/2015   Procedure: Left Heart Cath and Coronary Angiography;  Surgeon: Burnell Blanks, MD;  Location: [facility];  Service: Cardiovascular;  Laterality: N/A;  . CARPAL TUNNEL RELEASE  2007 and 2008   Bilateral  . CORONARY ANGIOPLASTY    . FOOT BONE EXCISION     Sesmoid bone left foot-repair of fracture  . TUBAL LIGATION      Current Outpatient Prescriptions  Medication Sig Dispense Refill  .  aspirin EC 81 MG EC tablet Take 1 tablet (81 mg total) by mouth daily.    Marland Kitchen atorvastatin (LIPITOR) 80 MG tablet TAKE (1) TABLET BY MOUTH ONCE DAILY. 90 tablet 2  . clopidogrel (PLAVIX) 75 MG tablet Take 1 tablet (75 mg total) by mouth daily. 90 tablet 2  . FLUoxetine (PROZAC) 20 MG capsule Take 2 capsules (40 mg total) by mouth daily. 180 capsule 2  . gabapentin (NEURONTIN) 100 MG capsule 3 qhs 270 capsule 1  . HYDROcodone-acetaminophen (NORCO) 10-325 MG tablet 1 bid prn 60 tablet 0  . isosorbide mononitrate (IMDUR) 30 MG 24 hr tablet TAKE (1/2) TABLET BY MOUTH DAILY. 45 tablet 2  . levalbuterol (XOPENEX HFA) 45 MCG/ACT inhaler Inhale 2 puffs into the lungs every 4 (four) hours as needed for wheezing. 1 Inhaler 12  . levothyroxine (SYNTHROID, LEVOTHROID) 125 MCG tablet Take 1 tablet (125 mcg total) by mouth daily. 90 tablet 3  . metoprolol tartrate (LOPRESSOR) 25 MG tablet TAKE 1 TABLET TWICE DAILY ( DOSE DECREASED ) 180 tablet 1  . nitroGLYCERIN (NITROSTAT) 0.4 MG SL tablet PLACE ONE (1) TABLET UNDER TONGUE EVERY 5 MINUTES UP TO (3) DOSES AS NEEDED FOR CHEST PAIN. 25 tablet 3  . pantoprazole (PROTONIX) 40 MG tablet TAKE 1 TABLET DAILY FOR ACID REFLUX. 90 tablet 2   No current facility-administered medications for this visit.    Allergies:  Albuterol; Cymbalta [duloxetine hcl]; and Lyrica [pregabalin]   Social History: The patient  reports that she quit smoking about a year ago. Her smoking use included Cigarettes. She has a 12.00 pack-year smoking history. She uses smokeless tobacco. She reports that she does not drink alcohol or use drugs.   ROS:  Please see the history of present illness. Otherwise, complete review of systems is positive for chronic neck pain.  All other systems are reviewed and negative.   Physical Exam: VS:  BP 100/70   Pulse (!) 58   Ht '5\' 6"'$  (1.676 m)   Wt 222 lb (100.7 kg)   SpO2 98%   BMI 35.83 kg/m , BMI Body mass index is 35.83 kg/m.  Wt Readings from Last 3  Encounters:  11/13/16 222 lb (100.7 kg)  10/21/16 228 lb 2 oz (103.5 kg)  06/24/16 223 lb 12.8 oz (101.5 kg)    Overweight woman, appears comfortable.  HEENT: Conjunctiva and lids normal, oropharynx clear.  Neck: Supple, no elevated JVP or carotid bruits, no thyromegaly.  Lungs: Clear to auscultation, nonlabored breathing at rest.  Cardiac: Regular rate and rhythm, no S3 or significant systolic murmur, no pericardial rub.  Abdomen: Soft, nontender, bowel sounds present.  Extremities: No pitting edema, distal pulses 2+.  Skin: Warm and dry. Tattoos extending up both arms.  Musculoskeletal: No kyphosis.  Neuropsychiatric: Alert and oriented x3, affect grossly appropriate.  ECG: I personally reviewed the tracing from 11/22/2015 which showed sinus bradycardia with anteroseptal Q waves.  Recent Labwork: 11/22/2015: Hemoglobin 11.6 06/19/2016: ALT 16; AST 19; BUN 16; Creatinine, Ser 1.05; Platelets 303; Potassium 4.7; Sodium 142 11/03/2016: TSH 0.656     Component Value Date/Time   CHOL 119 06/19/2016 1028   TRIG 97 06/19/2016 1028   HDL 48 06/19/2016 1028   CHOLHDL 2.5 06/19/2016 1028   CHOLHDL 3.6 11/16/2013 0752   VLDL 24 11/16/2013 0752   LDLCALC 52 06/19/2016 1028    Other Studies Reviewed Today:  Cardiac catheterization 11/21/2015:  Prox RCA to Dist RCA lesion, 10% stenosed.  Prox Cx lesion, 20% stenosed.  Ost 1st Mrg lesion, 20% stenosed.  There is mild left ventricular systolic dysfunction.  1. Elevated troponin 2. Patent stents in the mid LAD/Diagonal bifurcation. The Diagonal is the larger of the 2 vessels. The LAD becomes a small caliber vessel beyond the stent.  3. Mild disease in the RCA and Circumflex.  4. Mild segmental LV systolic dysfunction (unchanged from LV gram in 2013).  5. No obvious culprit lesions. Elevated troponin possibly due to vasospasm in setting of ongoing tobacco abuse.   Assessment and Plan:  1. Symptomatically stable CAD status  post previous DES intervention to the LAD and diagonal in 2005. Cardiac catheterization from last year revealed patent stent sites. Plan to continue medical therapy and observation.  2. Essential hypertension, blood pressure very well controlled today. No changes were made.  3. Hyperlipidemia, continues on statin therapy with recent LDL 52.  4. Long-standing history of tobacco abuse. She states she stopped smoking cigarettes, has been vaping instead (non nicotine).  Current medicines were reviewed with the patient today.   Orders Placed This Encounter  Procedures  . EKG 12-Lead    Disposition: Follow-up in 6 months.  Signed, Satira Sark, MD, [facility] 11/13/2016 9:42 AM    [facility] [place], [place], [place] [address] Phone: [phone]; Fax: [phone]

## 2016-11-13 NOTE — Patient Instructions (Signed)
Medication Instructions:  Your physician recommends that you continue on your current medications as directed. Please refer to the Current Medication list given to you today.  Labwork: NONE  Testing/Procedures: NONE  Follow-Up: Your physician wants you to follow-up in: [place]. [place]. You will receive a reminder letter in the mail two months in advance. If you don't receive a letter, please call our office to schedule the follow-up appointment.  Any Other Special Instructions Will Be Listed Below (If Applicable).  If you need a refill on your cardiac medications before your next appointment, please call your pharmacy.  [place]!

## 2017-03-04 DIAGNOSIS — I10 Essential (primary) hypertension: Secondary | ICD-10-CM | POA: Diagnosis not present

## 2017-03-04 DIAGNOSIS — H5200 Hypermetropia, unspecified eye: Secondary | ICD-10-CM | POA: Diagnosis not present

## 2017-03-04 DIAGNOSIS — Z0100 Encounter for examination of eyes and vision without abnormal findings: Secondary | ICD-10-CM | POA: Diagnosis not present

## 2017-03-10 ENCOUNTER — Ambulatory Visit (INDEPENDENT_AMBULATORY_CARE_PROVIDER_SITE_OTHER): Payer: Medicare HMO | Admitting: Family Medicine

## 2017-03-10 ENCOUNTER — Encounter: Payer: Self-pay | Admitting: Family Medicine

## 2017-03-10 VITALS — BP 128/84 | Ht 66.0 in | Wt 232.2 lb

## 2017-03-10 DIAGNOSIS — E038 Other specified hypothyroidism: Secondary | ICD-10-CM | POA: Diagnosis not present

## 2017-03-10 DIAGNOSIS — E782 Mixed hyperlipidemia: Secondary | ICD-10-CM

## 2017-03-10 DIAGNOSIS — R413 Other amnesia: Secondary | ICD-10-CM

## 2017-03-10 MED ORDER — HYDROCODONE-ACETAMINOPHEN 10-325 MG PO TABS: ORAL_TABLET | ORAL | 0 refills | Status: DC

## 2017-03-10 NOTE — Progress Notes (Signed)
   Subjective:    Patient ID: [name], female    DOB: 10/22/1955, 61 y.o.   MRN: [id number]  Hyperlipidemia  This is a chronic problem. The current episode started more than 1 year ago. Treatments tried: lipitor. There are no compliance problems.  Risk factors for coronary artery disease include dyslipidemia.  Patient has chronic pain in her back medication does help she denies abusing it. Denies any addiction to the medicine.  She does have heart disease she denies any chest tightness pressure pain shortness breath  She does have a history of depression but states is under good control takes her Prozac on a regular basis  She denies currently being depressed or anxious Patient states she feels like she is losing her mind and needs to talk to the doctor She is very concerned her short-term memory is not as good as it has been. She finds herself according to family repeating herself. She misplaces things on a very frequent basis compared to how she did in the past. In addition to this she even has a vents that she thinks happen when they truly did not happen. She has not hallucinated auditory or visual.   Review of Systems Denies headaches blurred vision nausea vomiting diarrhea denies chest pressure tightness pain denies swelling in the legs    Objective:   Physical Exam Lungs clear no crackles heart is regular no murmurs pulse normal BP good extremities no edema neurologic grossly normal  Immediate recall she does well with repeating things 3 for 3. After distraction 2/3  [place] cognitive assessment test-patient scores 27 out of 30. The 3 points she missed is for recall of 5 words she was given she could only recall 2 out of 5 words. After prompting she was able to recall 5 out of 5     Assessment & Plan:  Short-term memory dysfunction-not enough to call this Alzheimer's but enough to signal a significant change she does have a family history of dementia yeah that's very  extensive on her mother's side but the patient also has significant vascular issues and a history of smoking it is imperative to do an MRI to rule out multi-infarct dementia. In addition to this lab work ordered. Neurology consultation also ordered. May need to be started on medications depending on the outcomes.  Chronic pain prescriptions given for pain medicine I recommend stopping gabapentin because of neurologic potential side effects.  Depression under good control continue Prozac  Follow-up in a proximally 3 months sooner problems

## 2017-03-16 DIAGNOSIS — E038 Other specified hypothyroidism: Secondary | ICD-10-CM | POA: Diagnosis not present

## 2017-03-16 DIAGNOSIS — E782 Mixed hyperlipidemia: Secondary | ICD-10-CM | POA: Diagnosis not present

## 2017-03-16 DIAGNOSIS — R413 Other amnesia: Secondary | ICD-10-CM | POA: Diagnosis not present

## 2017-03-17 ENCOUNTER — Encounter: Payer: Self-pay | Admitting: Family Medicine

## 2017-03-17 DIAGNOSIS — E538 Deficiency of other specified B group vitamins: Secondary | ICD-10-CM | POA: Insufficient documentation

## 2017-03-17 LAB — BASIC METABOLIC PANEL
BUN/Creatinine Ratio: 9 — ABNORMAL LOW (ref 12–28)
BUN: 9 mg/dL (ref 8–27)
CO2: 25 mmol/L (ref 20–29)
Calcium: 9.5 mg/dL (ref 8.7–10.3)
Chloride: 105 mmol/L (ref 96–106)
Creatinine, Ser: 1.02 mg/dL — ABNORMAL HIGH (ref 0.57–1.00)
GFR calc Af Amer: 68 mL/min/{1.73_m2} (ref >59)
GFR calc non Af Amer: 59 mL/min/{1.73_m2} — ABNORMAL LOW (ref >59)
Glucose: 103 mg/dL — ABNORMAL HIGH (ref 65–99)
Potassium: 4.4 mmol/L (ref 3.5–5.2)
Sodium: 143 mmol/L (ref 134–144)

## 2017-03-17 LAB — LIPID PANEL
Chol/HDL Ratio: 2.7 ratio (ref 0.0–4.4)
Cholesterol, Total: 135 mg/dL (ref 100–199)
HDL: 50 mg/dL (ref >39)
LDL Calculated: 61 mg/dL (ref 0–99)
Triglycerides: 122 mg/dL (ref 0–149)
VLDL Cholesterol Cal: 24 mg/dL (ref 5–40)

## 2017-03-17 LAB — T4, FREE: Free T4: 1.22 ng/dL (ref 0.82–1.77)

## 2017-03-17 LAB — HEPATIC FUNCTION PANEL
ALT: 14 IU/L (ref 0–32)
AST: 16 IU/L (ref 0–40)
Albumin: 3.8 g/dL (ref 3.6–4.8)
Alkaline Phosphatase: 142 IU/L — ABNORMAL HIGH (ref 39–117)
Bilirubin Total: 0.4 mg/dL (ref 0.0–1.2)
Bilirubin, Direct: 0.11 mg/dL (ref 0.00–0.40)
Total Protein: 7 g/dL (ref 6.0–8.5)

## 2017-03-17 LAB — TSH: TSH: 0.455 u[IU]/mL (ref 0.450–4.500)

## 2017-03-17 LAB — VITAMIN B12: Vitamin B-12: 186 pg/mL — ABNORMAL LOW (ref 232–1245)

## 2017-03-18 ENCOUNTER — Ambulatory Visit (HOSPITAL_COMMUNITY)
Admission: RE | Admit: 2017-03-18 | Discharge: 2017-03-18 | Disposition: A | Discharge status: Home / Self Care | Payer: Medicare HMO | Source: Ambulatory Visit | Attending: Family Medicine | Admitting: Family Medicine

## 2017-03-18 DIAGNOSIS — G319 Degenerative disease of nervous system, unspecified: Secondary | ICD-10-CM | POA: Diagnosis not present

## 2017-03-18 DIAGNOSIS — J3489 Other specified disorders of nose and nasal sinuses: Secondary | ICD-10-CM | POA: Diagnosis not present

## 2017-03-18 DIAGNOSIS — I6782 Cerebral ischemia: Secondary | ICD-10-CM | POA: Insufficient documentation

## 2017-03-18 DIAGNOSIS — R413 Other amnesia: Secondary | ICD-10-CM | POA: Diagnosis not present

## 2017-03-18 MED ORDER — GADOBENATE DIMEGLUMINE 529 MG/ML IV SOLN
20.0000 mL | Freq: Once | INTRAVENOUS | Status: AC | PRN
  Administered 2017-03-18: 20 mL via INTRAVENOUS

## 2017-03-18 MED ORDER — LEVOTHYROXINE SODIUM 125 MCG PO TABS: 125.0000 ug | ORAL_TABLET | Freq: Every day | ORAL | 1 refills | Status: DC

## 2017-03-18 NOTE — Addendum Note (Signed)
Addended by: Karle Barr on: 03/18/2017 10:32 AM   Modules accepted: Orders

## 2017-03-31 ENCOUNTER — Other Ambulatory Visit: Payer: Self-pay | Admitting: Family Medicine

## 2017-05-04 ENCOUNTER — Ambulatory Visit: Payer: Medicare HMO | Admitting: Neurology

## 2017-05-05 ENCOUNTER — Encounter: Payer: Self-pay | Admitting: Neurology

## 2017-06-10 ENCOUNTER — Ambulatory Visit: Payer: Medicare HMO | Admitting: Family Medicine

## 2017-06-24 ENCOUNTER — Ambulatory Visit: Payer: Medicare HMO | Admitting: Neurology

## 2017-07-23 ENCOUNTER — Telehealth: Payer: Self-pay | Admitting: Family Medicine

## 2017-07-23 MED ORDER — HYDROCODONE-ACETAMINOPHEN 10-325 MG PO TABS: ORAL_TABLET | ORAL | 0 refills | Status: DC

## 2017-07-23 NOTE — Telephone Encounter (Signed)
Patient is requesting refill on hydrocodone 10/325

## 2017-07-23 NOTE — Telephone Encounter (Signed)
Prescription up front for pick up. Patient notified and advised she needs follow up office visit.

## 2017-07-23 NOTE — Telephone Encounter (Signed)
Ive one months rx and o v before further

## 2017-07-23 NOTE — Telephone Encounter (Signed)
Last seen 03/2017 for pain management and given 3 scripts and advised to follow up in 3 months

## 2017-08-04 ENCOUNTER — Other Ambulatory Visit: Payer: Self-pay | Admitting: Family Medicine

## 2017-08-17 ENCOUNTER — Other Ambulatory Visit: Payer: Self-pay | Admitting: Family Medicine

## 2017-08-28 ENCOUNTER — Other Ambulatory Visit: Payer: Self-pay | Admitting: Family Medicine

## 2017-09-01 NOTE — Telephone Encounter (Signed)
May have this +2 refills needs follow-up office visit within the next 2 months

## 2017-09-03 ENCOUNTER — Ambulatory Visit: Payer: Medicare HMO | Admitting: Family Medicine

## 2017-09-08 ENCOUNTER — Ambulatory Visit (INDEPENDENT_AMBULATORY_CARE_PROVIDER_SITE_OTHER): Payer: Medicare HMO | Admitting: Family Medicine

## 2017-09-08 ENCOUNTER — Encounter: Payer: Self-pay | Admitting: Family Medicine

## 2017-09-08 VITALS — BP 118/74 | Temp 98.5°F | Ht 66.0 in | Wt 221.0 lb

## 2017-09-08 DIAGNOSIS — E038 Other specified hypothyroidism: Secondary | ICD-10-CM

## 2017-09-08 DIAGNOSIS — G959 Disease of spinal cord, unspecified: Secondary | ICD-10-CM | POA: Diagnosis not present

## 2017-09-08 DIAGNOSIS — R109 Unspecified abdominal pain: Secondary | ICD-10-CM

## 2017-09-08 DIAGNOSIS — I10 Essential (primary) hypertension: Secondary | ICD-10-CM | POA: Diagnosis not present

## 2017-09-08 DIAGNOSIS — E538 Deficiency of other specified B group vitamins: Secondary | ICD-10-CM | POA: Diagnosis not present

## 2017-09-08 DIAGNOSIS — Z23 Encounter for immunization: Secondary | ICD-10-CM

## 2017-09-08 LAB — POCT URINALYSIS DIPSTICK
Spec Grav, UA: <=1.005 — AB (ref 1.010–1.025)
pH, UA: 5 (ref 5.0–8.0)

## 2017-09-08 MED ORDER — CYANOCOBALAMIN 1000 MCG/ML IJ SOLN
1000.0000 ug | Freq: Once | INTRAMUSCULAR | Status: AC
  Administered 2017-09-08: 1000 ug via INTRAMUSCULAR

## 2017-09-08 NOTE — Progress Notes (Signed)
Subjective:    Patient ID: [name], female    DOB: 01-Oct-1955, 62 y.o.   MRN: [id number]  HPI This patient was seen today for chronic pain. Pt states she has pain all over  The medication list was reviewed and updated.   -Compliance with medication: yes  - Number patient states they take daily: 1 -2 a day  -when was the last dose patient took? 2-3 days ago   The patient was advised the importance of maintaining medication and not using illegal substances with these.  Here for refills and follow up  The patient was educated that we can provide 3 monthly scripts for their medication, it is their responsibility to follow the instructions.  Side effects or complications from medications: none  Patient is aware that pain medications are meant to minimize the severity of the pain to allow their pain levels to improve to allow for better function. They are aware of that pain medications cannot totally remove their pain.  Due for UDT ( at least once per year) : due today.  Needs refill on prontonix.  States her reflux under good control with the medication she states when she takes it on a regular basis it does help she does try to watch her diet  Wants disability parking placard filled out.  She has orthopedic issues which prevent her from walking a long distance without significant pain and discomfort she is requesting this.  Patient feels her overall pain is doing better on CBD oil she no longer orthotics and she states she would like to stop these at this point we discussed that these could be restarted in the future if she should so desire to do so currently pain is under good control with CBD oil  Abdominal pain. Started 3 weeks ago. Constipation and diarrhea.  She describes this as a aching throughout her abdomen it comes and goes she also relates some diarrhea and constipation she has a history of IBS denies any blood in her stools  Patient here for follow-up regarding  cholesterol.  Patient does try to maintain a reasonable diet.  Patient does take the medication on a regular basis.  Denies missing a dose.  The patient denies any obvious side effects.  Prior blood work results reviewed with the patient.  The patient is aware of his cholesterol goals and the need to keep it under good control to lessen the risk of disease.  Patient for blood pressure check up. Patient relates compliance with meds. Todays BP reviewed with the patient. Patient denies issues with medication. Patient relates reasonable diet. Patient tries to minimize salt. Patient aware of BP goals.  She relates her depression seemingly is stable but her stress levels went up with some stuff that is going on with teenage children of her husband's she is doing her best to cope with this but she increased her Prozac to 80 mg it seems to be helping she denies being suicidal       Review of Systems  Constitutional: Negative for activity change and appetite change.  HENT: Negative for congestion and rhinorrhea.   Respiratory: Negative for cough.   Cardiovascular: Negative for chest pain.  Gastrointestinal: Positive for abdominal pain, constipation and diarrhea. Negative for vomiting.  Skin: Negative for color change.  Neurological: Negative for weakness.  Psychiatric/Behavioral: Negative for confusion.       Objective:   Physical Exam  Constitutional: She appears well-developed and well-nourished. No distress.  HENT:  Head:  Normocephalic and atraumatic.  Eyes: Right eye exhibits no discharge. Left eye exhibits no discharge.  Neck: No tracheal deviation present.  Cardiovascular: Normal rate, regular rhythm and normal heart sounds.  No murmur heard. Pulmonary/Chest: Effort normal and breath sounds normal. No respiratory distress. She has no wheezes. She has no rales.  Musculoskeletal: She exhibits no edema.  Lymphadenopathy:    She has no cervical adenopathy.  Neurological: She is alert. She  exhibits normal muscle tone.  Skin: Skin is warm and dry. No erythema.  Psychiatric: Her behavior is normal.  Vitals reviewed.   25 minutes was spent with the patient.  This statement verifies that 25 minutes was indeed spent with the patient. Greater than half the time was spent in discussion, counseling and answering questions  regarding the issues that the patient came in for today as reflected in the diagnosis (s) please refer to documentation for further details.       Assessment & Plan:  1. Abdominal pain, unspecified abdominal location With the intermittent abdominal pain and discomfort it is best for the patient be referred to gastroenterology she will need further evaluation and colonoscopy - POCT urinalysis dipstick - Ambulatory referral to Gastroenterology  2. Essential hypertension, benign Blood pressure under good control continue current measures watch diet  3. Other specified hypothyroidism Continue thyroid medicine previous labs show good control continue current measures  4. B12 deficiency Options were discussed with the patient in detail.  Patient has tried oral B12 supplements without success therefore will do vitamin B12 1 mL weekly for the next 4 weeks then monthly thereafter - cyanocobalamin ((VITAMIN B-12)) injection 1,000 mcg  5. Need for vaccination Flu vaccine per patient request - Flu Vaccine QUAD 36+ mos IM  6. Cervical myelopathy ([place]) The patient uses CBD oil this seems to help her.  She does not want to use narcotics any longer.  Therefore this was removed from her medication list  Patient's stress depression has worsened she is not suicidal she increased her Prozac to 80 mg a day it seems to be helping her she denies being suicidal she will continue the medication-she states she is just mainly stressed she feels it will get better if she has further trouble she will follow-up does not want count  Follow-up in 5 months sooner if problems

## 2017-09-15 ENCOUNTER — Ambulatory Visit (INDEPENDENT_AMBULATORY_CARE_PROVIDER_SITE_OTHER): Payer: Medicare HMO | Admitting: *Deleted

## 2017-09-15 DIAGNOSIS — D510 Vitamin B12 deficiency anemia due to intrinsic factor deficiency: Secondary | ICD-10-CM

## 2017-09-15 MED ORDER — CYANOCOBALAMIN 1000 MCG/ML IJ SOLN
1000.0000 ug | Freq: Once | INTRAMUSCULAR | Status: AC
  Administered 2017-09-15: 1000 ug via INTRAMUSCULAR

## 2017-09-22 ENCOUNTER — Ambulatory Visit: Payer: Medicare HMO

## 2017-09-22 ENCOUNTER — Telehealth: Payer: Self-pay | Admitting: Family Medicine

## 2017-09-22 ENCOUNTER — Ambulatory Visit (INDEPENDENT_AMBULATORY_CARE_PROVIDER_SITE_OTHER): Payer: Medicare HMO | Admitting: *Deleted

## 2017-09-22 ENCOUNTER — Other Ambulatory Visit: Payer: Self-pay | Admitting: *Deleted

## 2017-09-22 DIAGNOSIS — E538 Deficiency of other specified B group vitamins: Secondary | ICD-10-CM

## 2017-09-22 MED ORDER — CYANOCOBALAMIN 1000 MCG/ML IJ SOLN
1000.0000 ug | Freq: Once | INTRAMUSCULAR | Status: AC
  Administered 2017-09-22: 1000 ug via INTRAMUSCULAR

## 2017-09-22 MED ORDER — PANTOPRAZOLE SODIUM 40 MG PO TBEC: DELAYED_RELEASE_TABLET | ORAL | 1 refills | Status: DC

## 2017-09-22 NOTE — Telephone Encounter (Signed)
Pt is needing 90 day refills on pantoprazole (PROTONIX) 40 MG tablet   [facility]

## 2017-09-22 NOTE — Telephone Encounter (Signed)
Med sent to pharm. Pt notified.  

## 2017-09-29 ENCOUNTER — Ambulatory Visit (INDEPENDENT_AMBULATORY_CARE_PROVIDER_SITE_OTHER): Payer: Medicare HMO

## 2017-09-29 DIAGNOSIS — E538 Deficiency of other specified B group vitamins: Secondary | ICD-10-CM | POA: Diagnosis not present

## 2017-09-29 MED ORDER — CYANOCOBALAMIN 1000 MCG/ML IJ SOLN
1000.0000 ug | Freq: Once | INTRAMUSCULAR | Status: AC
  Administered 2017-09-29: 1000 ug via INTRAMUSCULAR

## 2017-10-08 ENCOUNTER — Ambulatory Visit (INDEPENDENT_AMBULATORY_CARE_PROVIDER_SITE_OTHER): Payer: Medicare HMO | Admitting: Internal Medicine

## 2017-10-08 ENCOUNTER — Encounter (INDEPENDENT_AMBULATORY_CARE_PROVIDER_SITE_OTHER): Payer: Self-pay | Admitting: Internal Medicine

## 2017-10-08 VITALS — BP 142/80 | HR 60 | Temp 98.2°F | Ht 66.0 in | Wt 221.8 lb

## 2017-10-08 DIAGNOSIS — Z1211 Encounter for screening for malignant neoplasm of colon: Secondary | ICD-10-CM | POA: Diagnosis not present

## 2017-10-08 DIAGNOSIS — K588 Other irritable bowel syndrome: Secondary | ICD-10-CM | POA: Diagnosis not present

## 2017-10-08 MED ORDER — HYOSCYAMINE SULFATE 0.125 MG SL SUBL: 0.1250 mg | SUBLINGUAL_TABLET | SUBLINGUAL | 1 refills | Status: DC | PRN

## 2017-10-08 NOTE — Progress Notes (Signed)
Subjective:    Patient ID: [name], female    DOB: [birth year], 62 y.o.   MRN: [id number]  HPI Referred by Dr. Wolfgang Phoenix for abdominal pain.  Hx of alternating between constipation and diarrhea for several years. Hospitalized several years for pancreatitis. She is not a drinker.  Hx of IBS. She tells me she is doing good today. She is not having diarrhea or constipation.  She denies any abdominal pain today. Her last colonoscopy was greater than 10 yrs ago except for hemorrhoids (Dr. Laural Golden).  For her IBS, she does not take anything.  She tries watch what she eats. She avoids red meats and avocados.   No change in her stool. No family hx of colon cancer. Appetite is good. No weight loss. Usually has a BM x 1 a day.     Hx of MI x 2 and has 2 stents and maintained on Plavix.  Review of Systems Past Medical History:  Diagnosis Date  . Anxiety   . Arthritis   . Asthma   . Coronary atherosclerosis of native coronary artery    Nonobstructive circ/RCA, DES LAD/diagonal 2005, LVEF 60%  . Depression    Prior suicide attempt  . Essential hypertension, benign   . Fibromyalgia   . GERD (gastroesophageal reflux disease)   . Hypothyroidism   . NSTEMI (non-ST elevated myocardial infarction) ([place]) 2005  . Palpitations    PVC's have been noted  . Sleep apnea     Past Surgical History:  Procedure Laterality Date  . CARDIAC CATHETERIZATION    . CARDIAC CATHETERIZATION N/A 11/21/2015   Procedure: Left Heart Cath and Coronary Angiography;  Surgeon: Burnell Blanks, MD;  Location: [facility];  Service: Cardiovascular;  Laterality: N/A;  . CARPAL TUNNEL RELEASE  2007 and 2008   Bilateral  . CORONARY ANGIOPLASTY    . FOOT BONE EXCISION     Sesmoid bone left foot-repair of fracture  . TUBAL LIGATION      Allergies  Allergen Reactions  . Albuterol Other (See Comments)    Tremors  . Cymbalta [Duloxetine Hcl]     Felt bad  . Lyrica [Pregabalin]     Retained  fluids    Current Outpatient Medications on File Prior to Visit  Medication Sig Dispense Refill  . atorvastatin (LIPITOR) 80 MG tablet TAKE 1 TABLET ONE TIME DAILY 90 tablet 2  . clopidogrel (PLAVIX) 75 MG tablet TAKE 1 TABLET ONE TIME DAILY 90 tablet 2  . FLUoxetine (PROZAC) 20 MG capsule TAKE 2 CAPSULES EVERY DAY 180 capsule 2  . isosorbide mononitrate (IMDUR) 30 MG 24 hr tablet TAKE 1/2 TABLET ONE TIME DAILY 45 tablet 2  . levalbuterol (XOPENEX HFA) 45 MCG/ACT inhaler Inhale 2 puffs into the lungs every 4 (four) hours as needed for wheezing. 1 Inhaler 12  . levothyroxine (SYNTHROID, LEVOTHROID) 125 MCG tablet TAKE 1 TABLET (125 MCG TOTAL) BY MOUTH DAILY. 90 tablet 1  . metoprolol tartrate (LOPRESSOR) 25 MG tablet TAKE 1 TABLET TWICE DAILY 180 tablet 0  . nitroGLYCERIN (NITROSTAT) 0.4 MG SL tablet PLACE ONE (1) TABLET UNDER TONGUE EVERY 5 MINUTES UP TO (3) DOSES AS NEEDED FOR CHEST PAIN. 25 tablet 3  . pantoprazole (PROTONIX) 40 MG tablet TAKE 1 TABLET DAILY FOR ACID REFLUX. 90 tablet 1   No current facility-administered medications on file prior to visit.         Objective:   Physical Exam Blood pressure (!) 142/80, pulse 60, temperature 98.2  F (36.8 C), height 5\' 6"  (1.676 m), weight 221 lb 12.8 oz (100.6 kg). Alert and oriented. Skin warm and dry. Oral mucosa is moist.   . Sclera anicteric, conjunctivae is pink. Thyroid not enlarged. No cervical lymphadenopathy. Lungs clear. Heart regular rate and rhythm.  Abdomen is soft. Bowel sounds are positive. No hepatomegaly. No abdominal masses felt. No tenderness.  No edema to lower extremities.            Assessment & Plan:  IBS. Am going to try her Levsin and she how she does. She is due for a screening colonoscopy. The risks of bleeding, perforation and infection were reviewed with patient.

## 2017-10-08 NOTE — Patient Instructions (Signed)
Rx for Levsin The risks of bleeding, perforation and infection were reviewed with patient.

## 2017-10-09 ENCOUNTER — Other Ambulatory Visit (INDEPENDENT_AMBULATORY_CARE_PROVIDER_SITE_OTHER): Payer: Self-pay | Admitting: Internal Medicine

## 2017-10-09 DIAGNOSIS — Z7189 Other specified counseling: Secondary | ICD-10-CM | POA: Insufficient documentation

## 2017-10-09 DIAGNOSIS — Z1231 Encounter for screening mammogram for malignant neoplasm of breast: Secondary | ICD-10-CM | POA: Insufficient documentation

## 2017-10-09 DIAGNOSIS — Z1211 Encounter for screening for malignant neoplasm of colon: Secondary | ICD-10-CM

## 2017-10-12 ENCOUNTER — Telehealth: Payer: Self-pay | Admitting: Cardiology

## 2017-10-12 NOTE — Telephone Encounter (Signed)
Plavix generally held 7 days prior to colonoscopy particularly if biopsies are anticipated.

## 2017-10-12 NOTE — Telephone Encounter (Signed)
Routed to provider

## 2017-10-12 NOTE — Telephone Encounter (Signed)
Note from Dr. Olevia Perches office. [name] is scheduled for her colonoscopy on 10/22/2017 and she says Dr. Domenic Polite handles her Plavix. Dr. Olevia Perches office needs to know how long to hold the Plavix.

## 2017-10-12 NOTE — Telephone Encounter (Signed)
Pt aware and voiced understanding 

## 2017-10-13 NOTE — Telephone Encounter (Signed)
[name] can you advise on how long since her MD said she could hold Plavix prior to procedure--he has up to 7--how many are you asking

## 2017-10-13 NOTE — Telephone Encounter (Signed)
seven

## 2017-10-14 ENCOUNTER — Telehealth (INDEPENDENT_AMBULATORY_CARE_PROVIDER_SITE_OTHER): Payer: Self-pay | Admitting: *Deleted

## 2017-10-14 ENCOUNTER — Encounter (INDEPENDENT_AMBULATORY_CARE_PROVIDER_SITE_OTHER): Payer: Self-pay | Admitting: *Deleted

## 2017-10-14 MED ORDER — PEG 3350-KCL-NA BICARB-NACL 420 G PO SOLR: 4000.0000 mL | Freq: Once | ORAL | 0 refills | Status: AC

## 2017-10-14 NOTE — Telephone Encounter (Signed)
rx sent

## 2017-10-20 ENCOUNTER — Other Ambulatory Visit: Payer: Self-pay | Admitting: Family Medicine

## 2017-10-21 ENCOUNTER — Other Ambulatory Visit: Payer: Self-pay

## 2017-10-21 ENCOUNTER — Observation Stay (HOSPITAL_BASED_OUTPATIENT_CLINIC_OR_DEPARTMENT_OTHER): Payer: Medicare HMO

## 2017-10-21 ENCOUNTER — Ambulatory Visit (HOSPITAL_COMMUNITY)
Admission: EM | Disposition: A | Discharge status: Home / Self Care | Payer: Self-pay | Source: Home / Self Care | Attending: Emergency Medicine

## 2017-10-21 ENCOUNTER — Emergency Department (HOSPITAL_COMMUNITY): Payer: Medicare HMO

## 2017-10-21 ENCOUNTER — Encounter (HOSPITAL_COMMUNITY): Payer: Self-pay

## 2017-10-21 ENCOUNTER — Observation Stay (HOSPITAL_COMMUNITY)
Admission: EM | Admit: 2017-10-21 | Discharge: 2017-10-22 | Disposition: A | Discharge status: Home / Self Care | Payer: Medicare HMO | Attending: Cardiovascular Disease | Admitting: Cardiovascular Disease

## 2017-10-21 DIAGNOSIS — M797 Fibromyalgia: Secondary | ICD-10-CM | POA: Diagnosis not present

## 2017-10-21 DIAGNOSIS — Z955 Presence of coronary angioplasty implant and graft: Secondary | ICD-10-CM | POA: Insufficient documentation

## 2017-10-21 DIAGNOSIS — I252 Old myocardial infarction: Secondary | ICD-10-CM | POA: Insufficient documentation

## 2017-10-21 DIAGNOSIS — Z8249 Family history of ischemic heart disease and other diseases of the circulatory system: Secondary | ICD-10-CM | POA: Insufficient documentation

## 2017-10-21 DIAGNOSIS — Z1211 Encounter for screening for malignant neoplasm of colon: Secondary | ICD-10-CM

## 2017-10-21 DIAGNOSIS — K219 Gastro-esophageal reflux disease without esophagitis: Secondary | ICD-10-CM | POA: Diagnosis not present

## 2017-10-21 DIAGNOSIS — Z7189 Other specified counseling: Secondary | ICD-10-CM

## 2017-10-21 DIAGNOSIS — F329 Major depressive disorder, single episode, unspecified: Secondary | ICD-10-CM | POA: Insufficient documentation

## 2017-10-21 DIAGNOSIS — I2510 Atherosclerotic heart disease of native coronary artery without angina pectoris: Secondary | ICD-10-CM | POA: Diagnosis present

## 2017-10-21 DIAGNOSIS — R0789 Other chest pain: Secondary | ICD-10-CM | POA: Diagnosis not present

## 2017-10-21 DIAGNOSIS — I10 Essential (primary) hypertension: Secondary | ICD-10-CM | POA: Insufficient documentation

## 2017-10-21 DIAGNOSIS — Z7902 Long term (current) use of antithrombotics/antiplatelets: Secondary | ICD-10-CM | POA: Diagnosis not present

## 2017-10-21 DIAGNOSIS — Z6835 Body mass index (BMI) 35.0-35.9, adult: Secondary | ICD-10-CM | POA: Insufficient documentation

## 2017-10-21 DIAGNOSIS — M1990 Unspecified osteoarthritis, unspecified site: Secondary | ICD-10-CM | POA: Insufficient documentation

## 2017-10-21 DIAGNOSIS — I25110 Atherosclerotic heart disease of native coronary artery with unstable angina pectoris: Secondary | ICD-10-CM | POA: Diagnosis not present

## 2017-10-21 DIAGNOSIS — F17210 Nicotine dependence, cigarettes, uncomplicated: Secondary | ICD-10-CM | POA: Insufficient documentation

## 2017-10-21 DIAGNOSIS — J45909 Unspecified asthma, uncomplicated: Secondary | ICD-10-CM | POA: Diagnosis not present

## 2017-10-21 DIAGNOSIS — E039 Hypothyroidism, unspecified: Secondary | ICD-10-CM | POA: Diagnosis not present

## 2017-10-21 DIAGNOSIS — G4730 Sleep apnea, unspecified: Secondary | ICD-10-CM | POA: Insufficient documentation

## 2017-10-21 DIAGNOSIS — F419 Anxiety disorder, unspecified: Secondary | ICD-10-CM | POA: Diagnosis not present

## 2017-10-21 DIAGNOSIS — Z1231 Encounter for screening mammogram for malignant neoplasm of breast: Secondary | ICD-10-CM

## 2017-10-21 DIAGNOSIS — E782 Mixed hyperlipidemia: Secondary | ICD-10-CM | POA: Diagnosis not present

## 2017-10-21 DIAGNOSIS — R079 Chest pain, unspecified: Secondary | ICD-10-CM | POA: Diagnosis not present

## 2017-10-21 DIAGNOSIS — I361 Nonrheumatic tricuspid (valve) insufficiency: Secondary | ICD-10-CM

## 2017-10-21 DIAGNOSIS — I200 Unstable angina: Secondary | ICD-10-CM

## 2017-10-21 DIAGNOSIS — R110 Nausea: Secondary | ICD-10-CM | POA: Diagnosis not present

## 2017-10-21 HISTORY — DX: Personal history of urinary calculi: Z87.442

## 2017-10-21 HISTORY — PX: CARDIAC CATHETERIZATION: SHX172

## 2017-10-21 HISTORY — PX: LEFT HEART CATH AND CORONARY ANGIOGRAPHY: CATH118249

## 2017-10-21 HISTORY — DX: Personal history of other medical treatment: Z92.89

## 2017-10-21 LAB — DIFFERENTIAL
Basophils Absolute: 0 10*3/uL (ref 0.0–0.1)
Basophils Relative: 1 %
Eosinophils Absolute: 0.3 10*3/uL (ref 0.0–0.7)
Eosinophils Relative: 4 %
Lymphocytes Relative: 25 %
Lymphs Abs: 2.2 10*3/uL (ref 0.7–4.0)
Monocytes Absolute: 0.7 10*3/uL (ref 0.1–1.0)
Monocytes Relative: 8 %
Neutro Abs: 5.5 10*3/uL (ref 1.7–7.7)
Neutrophils Relative %: 62 %

## 2017-10-21 LAB — BASIC METABOLIC PANEL
Anion gap: 11 (ref 5–15)
BUN: 11 mg/dL (ref 6–20)
CO2: 24 mmol/L (ref 22–32)
Calcium: 8.8 mg/dL — ABNORMAL LOW (ref 8.9–10.3)
Chloride: 102 mmol/L (ref 101–111)
Creatinine, Ser: 1.1 mg/dL — ABNORMAL HIGH (ref 0.44–1.00)
GFR calc Af Amer: >60 mL/min (ref >60)
GFR calc non Af Amer: 52 mL/min — ABNORMAL LOW (ref >60)
Glucose, Bld: 171 mg/dL — ABNORMAL HIGH (ref 65–99)
Potassium: 3.8 mmol/L (ref 3.5–5.1)
Sodium: 137 mmol/L (ref 135–145)

## 2017-10-21 LAB — HEPATIC FUNCTION PANEL
ALT: 17 U/L (ref 14–54)
AST: 22 U/L (ref 15–41)
Albumin: 3 g/dL — ABNORMAL LOW (ref 3.5–5.0)
Alkaline Phosphatase: 121 U/L (ref 38–126)
Bilirubin, Direct: <0.1 mg/dL — ABNORMAL LOW (ref 0.1–0.5)
Total Bilirubin: 0.5 mg/dL (ref 0.3–1.2)
Total Protein: 7.3 g/dL (ref 6.5–8.1)

## 2017-10-21 LAB — PROTIME-INR
INR: 1.01
Prothrombin Time: 13.2 seconds (ref 11.4–15.2)

## 2017-10-21 LAB — CBC
HCT: 38.1 % (ref 36.0–46.0)
Hemoglobin: 11.9 g/dL — ABNORMAL LOW (ref 12.0–15.0)
MCH: 29 pg (ref 26.0–34.0)
MCHC: 31.2 g/dL (ref 30.0–36.0)
MCV: 92.9 fL (ref 78.0–100.0)
Platelets: 306 10*3/uL (ref 150–400)
RBC: 4.1 MIL/uL (ref 3.87–5.11)
RDW: 15.3 % (ref 11.5–15.5)
WBC: 8.8 10*3/uL (ref 4.0–10.5)

## 2017-10-21 LAB — TROPONIN I
Troponin I: <0.03 ng/mL (ref <0.03)
Troponin I: 1.65 ng/mL (ref <0.03)

## 2017-10-21 SURGERY — LEFT HEART CATH AND CORONARY ANGIOGRAPHY
Anesthesia: LOCAL

## 2017-10-21 MED ORDER — HEPARIN (PORCINE) IN NACL 100-0.45 UNIT/ML-% IJ SOLN
1000.0000 [IU]/h | INTRAMUSCULAR | Status: DC
  Administered 2017-10-21: 1000 [IU]/h via INTRAVENOUS
  Filled 2017-10-21: qty 250

## 2017-10-21 MED ORDER — ONDANSETRON HCL 4 MG/2ML IJ SOLN: 4.0000 mg | Freq: Four times a day (QID) | INTRAMUSCULAR | Status: DC | PRN

## 2017-10-21 MED ORDER — SODIUM CHLORIDE 0.9% FLUSH: 3.0000 mL | INTRAVENOUS | Status: DC | PRN

## 2017-10-21 MED ORDER — METOPROLOL TARTRATE 25 MG PO TABS
25.0000 mg | ORAL_TABLET | Freq: Two times a day (BID) | ORAL | Status: DC
  Administered 2017-10-21 – 2017-10-22 (×2): 25 mg via ORAL
  Filled 2017-10-21 (×2): qty 1

## 2017-10-21 MED ORDER — FENTANYL CITRATE (PF) 100 MCG/2ML IJ SOLN
INTRAMUSCULAR | Status: AC
  Filled 2017-10-21: qty 2

## 2017-10-21 MED ORDER — ISOSORBIDE MONONITRATE 15 MG HALF TABLET
15.0000 mg | ORAL_TABLET | Freq: Every day | ORAL | Status: DC
  Administered 2017-10-22: 08:00:00 15 mg via ORAL
  Filled 2017-10-21: qty 1

## 2017-10-21 MED ORDER — MIDAZOLAM HCL 2 MG/2ML IJ SOLN
INTRAMUSCULAR | Status: DC | PRN
  Administered 2017-10-21: 1 mg via INTRAVENOUS

## 2017-10-21 MED ORDER — SODIUM CHLORIDE 0.9 % IV SOLN
250.0000 mL | INTRAVENOUS | Status: DC | PRN
Start: 2017-10-21 — End: 2017-10-22

## 2017-10-21 MED ORDER — HEPARIN SODIUM (PORCINE) 1000 UNIT/ML IJ SOLN
INTRAMUSCULAR | Status: DC | PRN
  Administered 2017-10-21: 5000 [IU] via INTRAVENOUS

## 2017-10-21 MED ORDER — NITROGLYCERIN 0.4 MG SL SUBL: 0.4000 mg | SUBLINGUAL_TABLET | SUBLINGUAL | Status: DC | PRN

## 2017-10-21 MED ORDER — HEPARIN BOLUS VIA INFUSION
4000.0000 [IU] | Freq: Once | INTRAVENOUS | Status: AC
  Administered 2017-10-21: 4000 [IU] via INTRAVENOUS

## 2017-10-21 MED ORDER — IOPAMIDOL (ISOVUE-370) INJECTION 76%
INTRAVENOUS | Status: AC
  Filled 2017-10-21: qty 100

## 2017-10-21 MED ORDER — LIDOCAINE HCL 1 % IJ SOLN
INTRAMUSCULAR | Status: AC
  Filled 2017-10-21: qty 20

## 2017-10-21 MED ORDER — LEVALBUTEROL HCL 0.63 MG/3ML IN NEBU: 0.6300 mg | INHALATION_SOLUTION | RESPIRATORY_TRACT | Status: DC | PRN

## 2017-10-21 MED ORDER — LIDOCAINE HCL (PF) 1 % IJ SOLN
INTRAMUSCULAR | Status: DC | PRN
Start: 2017-10-21 — End: 2017-10-21
  Administered 2017-10-21: 2 mL

## 2017-10-21 MED ORDER — LEVOTHYROXINE SODIUM 125 MCG PO TABS
125.0000 ug | ORAL_TABLET | Freq: Every day | ORAL | Status: DC
  Administered 2017-10-22: 06:00:00 125 ug via ORAL
  Filled 2017-10-21: qty 1

## 2017-10-21 MED ORDER — IOPAMIDOL (ISOVUE-370) INJECTION 76%
INTRAVENOUS | Status: DC | PRN
  Administered 2017-10-21: 40 mL via INTRA_ARTERIAL

## 2017-10-21 MED ORDER — SODIUM CHLORIDE 0.9 % IV SOLN: INTRAVENOUS | Status: AC

## 2017-10-21 MED ORDER — HEPARIN (PORCINE) IN NACL 2-0.9 UNIT/ML-% IJ SOLN
INTRAMUSCULAR | Status: AC
  Filled 2017-10-21: qty 1000

## 2017-10-21 MED ORDER — FLUOXETINE HCL 20 MG PO CAPS
20.0000 mg | ORAL_CAPSULE | Freq: Every day | ORAL | Status: DC
  Administered 2017-10-22: 20 mg via ORAL
  Filled 2017-10-21: qty 1

## 2017-10-21 MED ORDER — FENTANYL CITRATE (PF) 100 MCG/2ML IJ SOLN
INTRAMUSCULAR | Status: DC | PRN
  Administered 2017-10-21: 25 ug via INTRAVENOUS

## 2017-10-21 MED ORDER — HEPARIN SODIUM (PORCINE) 1000 UNIT/ML IJ SOLN
INTRAMUSCULAR | Status: AC
  Filled 2017-10-21: qty 1

## 2017-10-21 MED ORDER — VERAPAMIL HCL 2.5 MG/ML IV SOLN
INTRAVENOUS | Status: DC | PRN
  Administered 2017-10-21: 10 mL via INTRA_ARTERIAL

## 2017-10-21 MED ORDER — ASPIRIN EC 81 MG PO TBEC
81.0000 mg | DELAYED_RELEASE_TABLET | Freq: Every day | ORAL | Status: DC
  Administered 2017-10-22: 81 mg via ORAL
  Filled 2017-10-21: qty 1

## 2017-10-21 MED ORDER — SODIUM CHLORIDE 0.9 % IV SOLN
Freq: Once | INTRAVENOUS | Status: AC
  Administered 2017-10-21: 10:00:00 via INTRAVENOUS

## 2017-10-21 MED ORDER — HEPARIN (PORCINE) IN NACL 2-0.9 UNIT/ML-% IJ SOLN
INTRAMUSCULAR | Status: AC | PRN
  Administered 2017-10-21 (×2): 500 mL via INTRA_ARTERIAL

## 2017-10-21 MED ORDER — PANTOPRAZOLE SODIUM 40 MG PO TBEC
40.0000 mg | DELAYED_RELEASE_TABLET | Freq: Every day | ORAL | Status: DC
  Administered 2017-10-22: 40 mg via ORAL
  Filled 2017-10-21: qty 1

## 2017-10-21 MED ORDER — SODIUM CHLORIDE 0.9% FLUSH
3.0000 mL | Freq: Two times a day (BID) | INTRAVENOUS | Status: DC
  Administered 2017-10-21: 21:00:00 via INTRAVENOUS

## 2017-10-21 MED ORDER — MORPHINE SULFATE (PF) 2 MG/ML IV SOLN
2.0000 mg | Freq: Once | INTRAVENOUS | Status: AC
  Administered 2017-10-21: 2 mg via INTRAVENOUS
  Filled 2017-10-21: qty 1

## 2017-10-21 MED ORDER — MIDAZOLAM HCL 2 MG/2ML IJ SOLN
INTRAMUSCULAR | Status: AC
  Filled 2017-10-21: qty 2

## 2017-10-21 MED ORDER — ACETAMINOPHEN 325 MG PO TABS: 650.0000 mg | ORAL_TABLET | ORAL | Status: DC | PRN

## 2017-10-21 MED ORDER — ATORVASTATIN CALCIUM 80 MG PO TABS
80.0000 mg | ORAL_TABLET | Freq: Every day | ORAL | Status: DC
  Administered 2017-10-22: 80 mg via ORAL
  Filled 2017-10-21: qty 1

## 2017-10-21 MED ORDER — SODIUM CHLORIDE 0.9 % IV SOLN: INTRAVENOUS | Status: DC

## 2017-10-21 MED ORDER — VERAPAMIL HCL 2.5 MG/ML IV SOLN
INTRAVENOUS | Status: AC
  Filled 2017-10-21: qty 2

## 2017-10-21 MED ORDER — NITROGLYCERIN 0.4 MG SL SUBL
0.4000 mg | SUBLINGUAL_TABLET | Freq: Once | SUBLINGUAL | Status: AC
  Administered 2017-10-21: 0.4 mg via SUBLINGUAL
  Filled 2017-10-21: qty 1

## 2017-10-21 SURGICAL SUPPLY — 12 items
BAND CMPR LRG ZPHR (HEMOSTASIS) ×1
BAND ZEPHYR COMPRESS 30 LONG (HEMOSTASIS) ×1 IMPLANT
CATH OPTITORQUE JACKY 4.0 5F (CATHETERS) ×1 IMPLANT
GUIDEWIRE INQWIRE 1.5J.035X260 (WIRE) IMPLANT
INQWIRE 1.5J .035X260CM (WIRE) ×2
KIT HEART LEFT (KITS) ×2 IMPLANT
NDL PERC 21GX4CM (NEEDLE) IMPLANT
NEEDLE PERC 21GX4CM (NEEDLE) ×2 IMPLANT
PACK CARDIAC CATHETERIZATION (CUSTOM PROCEDURE TRAY) ×2 IMPLANT
SHEATH RAIN RADIAL 21G 6FR (SHEATH) ×1 IMPLANT
TRANSDUCER W/STOPCOCK (MISCELLANEOUS) ×2 IMPLANT
TUBING CIL FLEX 10 FLL-RA (TUBING) ×2 IMPLANT

## 2017-10-21 NOTE — ED Notes (Signed)
Pt scheduled for Endoscopy tomorrow (10/22/2017) at [facility] @0725  tomorrow.

## 2017-10-21 NOTE — ED Notes (Signed)
Cardiologist and  PA in to discuss with pt cath and plan of care

## 2017-10-21 NOTE — Progress Notes (Signed)
  Echocardiogram 2D Echocardiogram has been performed.  [name] 10/21/2017, 6:55 PM

## 2017-10-21 NOTE — Interval H&P Note (Signed)
Cath Lab Visit (complete for each Cath Lab visit)  Clinical Evaluation Leading to the Procedure:   ACS: Yes.    Non-ACS:  n/a    History and Physical Interval Note:  10/21/2017 5:04 PM  [name]  has presented today for surgery, with the diagnosis of ua  The various methods of treatment have been discussed with the patient and family. After consideration of risks, benefits and other options for treatment, the patient has consented to  Procedure(s): LEFT HEART CATH AND CORONARY ANGIOGRAPHY (N/A) as a surgical intervention .  The patient's history has been reviewed, patient examined, no change in status, stable for surgery.  I have reviewed the patient's chart and labs.  Questions were answered to the patient's satisfaction.     [name]

## 2017-10-21 NOTE — ED Triage Notes (Signed)
REMS brought Pt to ED from home

## 2017-10-21 NOTE — ED Triage Notes (Signed)
Pt woke up at home this morning with 10 out of 10 sub-sternal chest pain radiating to her jaw apprx @ 0515. Pt has had 2 NTG, 1 324mg  ASA, 1 4mg  Zofran prior to ED Arrival. Pt no longer feeling nauseated and pain has subdued.

## 2017-10-21 NOTE — ED Notes (Signed)
Dr. Johnsie Cancel, Cardiology and [place] here earlier to see the Pt.

## 2017-10-21 NOTE — ED Notes (Addendum)
Report to [facility] with [place] with [facility], ETA 29m

## 2017-10-21 NOTE — ED Notes (Signed)
Dr Johnsie Cancel in to discuss with pt

## 2017-10-21 NOTE — ED Notes (Signed)
Report to [facility] in Cath lab

## 2017-10-21 NOTE — Progress Notes (Signed)
I reviewed the patient's echocardiogram which showed mildly reduced LV systolic function with an EF of 45-50% which is not different from before.  No evidence of pericardial effusion.

## 2017-10-21 NOTE — Progress Notes (Signed)
Pt received from [facility] via [place] alert and oriented X4.  Pt placed on monitor, IVF infusing and Heparin IV at 10cc/hr.  Pt complained of CP a 5 out of 10, after arriving to the cath lab HA, but  decrease to a level 2 out of 10 after 5 mins. No acute distress noted at this time. Consent signed for procedure.

## 2017-10-21 NOTE — ED Provider Notes (Signed)
[facility] EMERGENCY DEPARTMENT Provider Note   CSN: [id number] Arrival date & time: 10/21/17  9563     History   Chief Complaint Chief Complaint  Patient presents with  . Chest Pain    HPI [name] is a 62 y.o. female.  Patient complains of chest discomfort that started at 5:00 today.  She states it hurts like she did when she had her MI last time 2 years ago.  Patient has had 2 MIs.  And has 2 stents.  She recently stopped taking Plavix a week ago so she can have a colonoscopy tomorrow.  Last time she had a non-STEMI she was off Plavix for 9 days   The history is provided by the patient.  Chest Pain   This is a new problem. The current episode started 3 to 5 hours ago. The problem occurs constantly. The problem has been resolved. The pain is associated with walking. The pain is present in the substernal region. The pain is at a severity of 5/10. The pain is moderate. Pertinent negatives include no abdominal pain, no back pain, no cough and no headaches.  Pertinent negatives for past medical history include no seizures.    Past Medical History:  Diagnosis Date  . Anxiety   . Arthritis   . Asthma   . Coronary atherosclerosis of native coronary artery    a. s/p DES to LAD and D1 in 2005 b. cath in 11/2015 showing patent stents with mild disease along RCA and LCx  . Depression    Prior suicide attempt  . Essential hypertension, benign   . Fibromyalgia   . GERD (gastroesophageal reflux disease)   . Hypothyroidism   . NSTEMI (non-ST elevated myocardial infarction) ([place]) 2005  . Palpitations    PVC's have been noted  . Sleep apnea     Patient Active Problem List   Diagnosis Date Noted  . Encounter for screening colonoscopy 10/09/2017  . B12 deficiency 03/17/2017  . Tobacco abuse   . Bradycardia   . NSTEMI (non-ST elevated myocardial infarction) ([place]) 11/21/2015  . Elevated troponin   . Chest pain on exertion 11/20/2015  . Cervical myelopathy ([place])  09/04/2015  . Morbid obesity ([place]) 09/12/2014  . External hemorrhoid 04/30/2014  . Irritable bowel syndrome 04/30/2014  . Fatty liver disease, nonalcoholic 87/56/4332  . Impaired fasting glucose 11/18/2013  . Anxiety and depression 11/10/2013  . GERD (gastroesophageal reflux disease) 11/10/2013  . Scalp psoriasis 12/29/2012  . Fibromyalgia 12/29/2012  . Essential hypertension, benign 02/08/2010  . Hypothyroidism 03/08/2009  . Mixed hyperlipidemia 03/08/2009  . CORONARY ATHEROSCLEROSIS NATIVE CORONARY ARTERY 03/08/2009    Past Surgical History:  Procedure Laterality Date  . CARDIAC CATHETERIZATION    . CARDIAC CATHETERIZATION N/A 11/21/2015   Procedure: Left Heart Cath and Coronary Angiography;  Surgeon: Burnell Blanks, MD;  Location: [facility];  Service: Cardiovascular;  Laterality: N/A;  . CARPAL TUNNEL RELEASE  2007 and 2008   Bilateral  . CORONARY ANGIOPLASTY    . FOOT BONE EXCISION     Sesmoid bone left foot-repair of fracture  . TUBAL LIGATION      OB History    No data available       Home Medications    Prior to Admission medications   Medication Sig Start Date End Date Taking? Authorizing Provider  atorvastatin (LIPITOR) 80 MG tablet TAKE 1 TABLET ONE TIME DAILY Patient taking differently: Take 80 mg by mouth daily.  09/01/17  Yes  Kathyrn Drown, MD  clopidogrel (PLAVIX) 75 MG tablet TAKE 1 TABLET ONE TIME DAILY Patient taking differently: TAKE 75 MG BY MOUTH ONE TIME DAILY 09/01/17  Yes Luking, Elayne Snare, MD  Cyanocobalamin (VITAMIN B-12 PO) Take 1 tablet by mouth daily.   Yes [provider]  FLUoxetine (PROZAC) 20 MG capsule TAKE 2 CAPSULES EVERY DAY Patient taking differently: Take 20 mg by mouth daily 09/01/17  Yes Luking, Elayne Snare, MD  isosorbide mononitrate (IMDUR) 30 MG 24 hr tablet TAKE 1/2 TABLET ONE TIME DAILY Patient taking differently: Take 15 mg by mouth daily.  09/01/17  Yes Kathyrn Drown, MD  levalbuterol [facility] HFA) 45  MCG/ACT inhaler Inhale 2 puffs into the lungs every 4 (four) hours as needed for wheezing. 06/13/15  Yes Luking, Elayne Snare, MD  levothyroxine (SYNTHROID, LEVOTHROID) 125 MCG tablet TAKE 1 TABLET (125 MCG TOTAL) BY MOUTH DAILY. 08/05/17  Yes Luking, Elayne Snare, MD  nitroGLYCERIN (NITROSTAT) 0.4 MG SL tablet PLACE ONE (1) TABLET UNDER TONGUE EVERY 5 MINUTES UP TO (3) DOSES AS NEEDED FOR CHEST PAIN. Patient taking differently: Place 0.4 mg under the tongue every 5 (five) minutes as needed for chest pain.  10/21/16  Yes Luking, Scott A, MD  pantoprazole (PROTONIX) 40 MG tablet TAKE 1 TABLET DAILY FOR ACID REFLUX. Patient taking differently: Take 40 mg by mouth daily.  09/22/17  Yes Luking, Elayne Snare, MD  cyanocobalamin (,VITAMIN B-12,) 1000 MCG/ML injection Inject 1,000 mcg into the muscle every 30 (thirty) days.    [provider]  hyoscyamine (LEVSIN SL) 0.125 MG SL tablet Place 1 tablet (0.125 mg total) under the tongue every 4 (four) hours as needed. Patient not taking: Reported on 10/21/2017 10/08/17   Butch Penny, NP  metoprolol tartrate (LOPRESSOR) 25 MG tablet TAKE 1 TABLET TWICE DAILY Patient taking differently: TAKE 1 TABLET ONCE DAILY 10/21/17   Kathyrn Drown, MD    Family History Family History  Problem Relation Age of Onset  . Coronary artery disease Father        MI age 6  . Heart failure Father   . Heart failure Mother   . Anesthesia problems Neg Hx   . Hypotension Neg Hx   . Malignant hyperthermia Neg Hx   . Pseudochol deficiency Neg Hx     Social History Social History   Tobacco Use  . Smoking status: Former Smoker    Packs/day: 0.30    Years: 40.00    Pack years: 12.00    Types: Cigarettes  . Smokeless tobacco: Current User  . Tobacco comment: vaping now   Substance Use Topics  . Alcohol use: No    Alcohol/week: 0.0 oz  . Drug use: No     Allergies   Albuterol; Cymbalta [duloxetine hcl]; and Lyrica [pregabalin]   Review of Systems Review of Systems    Constitutional: Negative for appetite change and fatigue.  HENT: Negative for congestion, ear discharge and sinus pressure.   Eyes: Negative for discharge.  Respiratory: Negative for cough.   Cardiovascular: Positive for chest pain.  Gastrointestinal: Negative for abdominal pain and diarrhea.  Genitourinary: Negative for frequency and hematuria.  Musculoskeletal: Negative for back pain.  Skin: Negative for rash.  Neurological: Negative for seizures and headaches.  Psychiatric/Behavioral: Negative for hallucinations.     Physical Exam Updated Vital Signs BP 104/67   Pulse (!) 53   Temp 98 F (36.7 C) (Oral)   Resp 15   Ht 5\' 6"  (1.676  m)   Wt 99.8 kg (220 lb)   SpO2 92%   BMI 35.51 kg/m   Physical Exam  Constitutional: She is oriented to person, place, and time. She appears well-developed.  HENT:  Head: Normocephalic.  Eyes: Conjunctivae and EOM are normal. No scleral icterus.  Neck: Neck supple. No thyromegaly present.  Cardiovascular: Normal rate and regular rhythm. Exam reveals no gallop and no friction rub.  No murmur heard. Pulmonary/Chest: No stridor. She has no wheezes. She has no rales. She exhibits no tenderness.  Abdominal: She exhibits no distension. There is no tenderness. There is no rebound.  Musculoskeletal: Normal range of motion. She exhibits no edema.  Lymphadenopathy:    She has no cervical adenopathy.  Neurological: She is oriented to person, place, and time. She exhibits normal muscle tone. Coordination normal.  Skin: No rash noted. No erythema.  Psychiatric: She has a normal mood and affect. Her behavior is normal.  .   ED Treatments / Results  Labs (all labs ordered are listed, but only abnormal results are displayed) Labs Reviewed  BASIC METABOLIC PANEL - Abnormal; Notable for the following components:      Result Value   Glucose, Bld 171 (*)    Creatinine, Ser 1.10 (*)    Calcium 8.8 (*)    GFR calc non Af Amer 52 (*)    All other  components within normal limits  CBC - Abnormal; Notable for the following components:   Hemoglobin 11.9 (*)    All other components within normal limits  HEPATIC FUNCTION PANEL - Abnormal; Notable for the following components:   Albumin 3.0 (*)    Bilirubin, Direct <0.1 (*)    All other components within normal limits  TROPONIN I  DIFFERENTIAL    EKG  EKG Interpretation  Date/Time:  Wednesday October 21 2017 06:44:36 EDT Ventricular Rate:  51 PR Interval:    QRS Duration: 107 QT Interval:  479 QTC Calculation: 442 R Axis:   12 Text Interpretation:  Sinus rhythm Probable anteroseptal infarct, old No significant change since last tracing Confirmed by Ripley Fraise [phone]) on 10/21/2017 6:47:25 AM       Radiology Dg Chest 2 View  Result Date: 10/21/2017 CLINICAL DATA:  Chest pain EXAM: CHEST - 2 VIEW COMPARISON:  November 20, 2015 FINDINGS: Lungs are clear. Heart size and pulmonary vascularity are normal. No adenopathy. No pneumothorax. No bone lesions. IMPRESSION: No edema or consolidation. Electronically Signed   By: Lowella Grip III M.D.   On: 10/21/2017 07:40    Procedures Procedures (including critical care time)  Medications Ordered in ED Medications  heparin bolus via infusion 4,000 Units (not administered)  heparin ADULT infusion 100 units/mL (25000 units/232mL sodium chloride 0.45%) (not administered)  morphine 2 MG/ML injection 2 mg (2 mg Intravenous Given 10/21/17 0745)  nitroGLYCERIN (NITROSTAT) SL tablet 0.4 mg (0.4 mg Sublingual Given 10/21/17 0745)     Initial Impression / Assessment and Plan / ED Course  I have reviewed the triage vital signs and the nursing notes.  Pertinent labs & imaging results that were available during my care of the patient were reviewed by me and considered in my medical decision making (see chart for details).    CRITICAL CARE Performed by: Milton Ferguson Total critical care time:108minutes Critical care time was exclusive of  separately billable procedures and treating other patients. Critical care was necessary to treat or prevent imminent or life-threatening deterioration. Critical care was time spent personally by me on the  following activities: development of treatment plan with patient and/or surrogate as well as nursing, discussions with consultants, evaluation of patient's response to treatment, examination of patient, obtaining history from patient or surrogate, ordering and performing treatments and interventions, ordering and review of laboratory studies, ordering and review of radiographic studies, pulse oximetry and re-evaluation of patient's condition.  Labs reviewed and EKG and chest x-ray.  EKG shows no acute changes troponin is normal.  Patient was given a third nitro in the emergency department along with some morphine and her pain almost completely disappeared.  I spoke with cardiology Dr. Johnsie Cancel and he stated the patient should be put on heparin and transferred to [facility].  He will consult on the patient here in the emergency department  Final Clinical Impressions(s) / ED Diagnoses   Final diagnoses:  Unstable angina [facility])    ED Discharge Orders    None       Milton Ferguson, MD 10/21/17 [phone]

## 2017-10-21 NOTE — ED Notes (Signed)
Pt reports to EDP that she has been taken off of her plavix for 1 week to facilitate her colonoscopy  Also report that this is how she had her previous MI

## 2017-10-21 NOTE — H&P (Signed)
History & Physical    Patient ID: [name] MRN: [id number], DOB/AGE: 64-11-1955   Admit date: 10/21/2017  Primary Care Provider: Kathyrn Drown, MD Primary Cardiologist: Rozann Lesches, MD   Chief Complaint: Chest Pain  Patient Profile    [name] is a 62 y.o. female with past medical history of CAD (s/p DES to LAD and D1 in 2005, cath in 11/2015 showing patent stents with mild disease along RCA and LCx), HTN, HLD, tobacco use and GERD who is being seen today for evaluation of chest pain at the request of Dr. Roderic Palau.  History of Present Illness    [name] was last examined by Dr. Domenic Polite in 11/2016 and reported doing well from a cardiac perspective at that time. She was continued on her current medication regimen at that time. In the interim, she was evaluated by GI and was scheduled for a colonoscopy on 10/22/2017, therefore she has been holding Plavix for the past 6 days.   She presented to [facility] ED on 10/21/2017 for evaluation of chest pain which started earlier this morning. She reports awaking from sleep with a pressure along her left pectoral region which radiated into her neck. Noted associated dyspnea and diaphoresis at that time. Reports symptoms are identical to what she experienced in 2017 and she immediately called EMS. She was given sublingual nitroglycerin and IV Morphine while in the ED and is now pain-free.   She reports having dyspnea on exertion at baseline but denies any recent changes in this. No recent orthopnea, PND, or lower extremity edema. She was scheduled for a colonoscopy tomorrow but says this was for symptoms consistent with irritable bowel. Denies any recent melena or hematochezia.  Initial labs show WBC of 8.8, Hgb 11.9, platelets 306, Na+ 137, K+ 3.8, and creatinine 1.10 (close to baseline). Initial troponin negative. CXR showing no edema or consolidation. EKG shows sinus bradycardia, HR 51, with no acute ST or T-wave  changes when compared to prior tracings.    Past Medical History:  Diagnosis Date  . Anxiety   . Arthritis   . Asthma   . Coronary atherosclerosis of native coronary artery    a. s/p DES to LAD and D1 in 2005 b. cath in 11/2015 showing patent stents with mild disease along RCA and LCx  . Depression    Prior suicide attempt  . Essential hypertension, benign   . Fibromyalgia   . GERD (gastroesophageal reflux disease)   . Hypothyroidism   . NSTEMI (non-ST elevated myocardial infarction) ([place]) 2005  . Palpitations    PVC's have been noted  . Sleep apnea     Past Surgical History:  Procedure Laterality Date  . CARDIAC CATHETERIZATION    . CARDIAC CATHETERIZATION N/A 11/21/2015   Procedure: Left Heart Cath and Coronary Angiography;  Surgeon: Burnell Blanks, MD;  Location: [place] [facility];  Service: Cardiovascular;  Laterality: N/A;  . CARPAL TUNNEL RELEASE  2007 and 2008   Bilateral  . CORONARY ANGIOPLASTY    . FOOT BONE EXCISION     Sesmoid bone left foot-repair of fracture  . TUBAL LIGATION       Medications Prior to Admission: Prior to Admission medications   Medication Sig Start Date End Date Taking? Authorizing Provider  atorvastatin (LIPITOR) 80 MG tablet TAKE 1 TABLET ONE TIME DAILY Patient taking differently: Take 80 mg by mouth daily.  09/01/17  Yes Kathyrn Drown, MD  clopidogrel (PLAVIX) 75 MG  tablet TAKE 1 TABLET ONE TIME DAILY Patient taking differently: TAKE 75 MG BY MOUTH ONE TIME DAILY 09/01/17  Yes Luking, Elayne Snare, MD  Cyanocobalamin (VITAMIN B-12 PO) Take 1 tablet by mouth daily.   Yes [provider]  FLUoxetine (PROZAC) 20 MG capsule TAKE 2 CAPSULES EVERY DAY Patient taking differently: Take 20 mg by mouth daily 09/01/17  Yes Luking, Elayne Snare, MD  isosorbide mononitrate (IMDUR) 30 MG 24 hr tablet TAKE 1/2 TABLET ONE TIME DAILY Patient taking differently: Take 15 mg by mouth daily.  09/01/17  Yes Kathyrn Drown, MD  levalbuterol [facility]  HFA) 45 MCG/ACT inhaler Inhale 2 puffs into the lungs every 4 (four) hours as needed for wheezing. 06/13/15  Yes Luking, Elayne Snare, MD  levothyroxine (SYNTHROID, LEVOTHROID) 125 MCG tablet TAKE 1 TABLET (125 MCG TOTAL) BY MOUTH DAILY. 08/05/17  Yes Luking, Elayne Snare, MD  nitroGLYCERIN (NITROSTAT) 0.4 MG SL tablet PLACE ONE (1) TABLET UNDER TONGUE EVERY 5 MINUTES UP TO (3) DOSES AS NEEDED FOR CHEST PAIN. Patient taking differently: Place 0.4 mg under the tongue every 5 (five) minutes as needed for chest pain.  10/21/16  Yes Luking, Scott A, MD  pantoprazole (PROTONIX) 40 MG tablet TAKE 1 TABLET DAILY FOR ACID REFLUX. Patient taking differently: Take 40 mg by mouth daily.  09/22/17  Yes Luking, Elayne Snare, MD  cyanocobalamin (,VITAMIN B-12,) 1000 MCG/ML injection Inject 1,000 mcg into the muscle every 30 (thirty) days.    [provider]  hyoscyamine (LEVSIN SL) 0.125 MG SL tablet Place 1 tablet (0.125 mg total) under the tongue every 4 (four) hours as needed. Patient not taking: Reported on 10/21/2017 10/08/17   Butch Penny, NP  metoprolol tartrate (LOPRESSOR) 25 MG tablet TAKE 1 TABLET TWICE DAILY Patient taking differently: TAKE 1 TABLET ONCE DAILY 10/21/17   Kathyrn Drown, MD     Allergies:    Allergies  Allergen Reactions  . Albuterol Other (See Comments)    Tremors  . Cymbalta [Duloxetine Hcl] Other (See Comments)    Felt bad  . Lyrica [Pregabalin] Other (See Comments)    Retained fluids    Social History:   Social History   Socioeconomic History  . Marital status: Married    Spouse name: Not on file  . Number of children: Not on file  . Years of education: Not on file  . Highest education level: Not on file  Social Needs  . Financial resource strain: Not on file  . Food insecurity - worry: Not on file  . Food insecurity - inability: Not on file  . Transportation needs - medical: Not on file  . Transportation needs - non-medical: Not on file  Occupational History  . Not on  file  Tobacco Use  . Smoking status: Former Smoker    Packs/day: 0.30    Years: 40.00    Pack years: 12.00    Types: Cigarettes  . Smokeless tobacco: Current User  . Tobacco comment: vaping now   Substance and Sexual Activity  . Alcohol use: No    Alcohol/week: 0.0 oz  . Drug use: No  . Sexual activity: Yes    Birth control/protection: Post-menopausal  Other Topics Concern  . Not on file  Social History Narrative  . Not on file      Family History:  The patient's family history includes Coronary artery disease in her father; Heart failure in her father and mother. There is no history of Anesthesia problems, Hypotension,  Malignant hyperthermia, or Pseudochol deficiency.     Review of Systems    General:  No chills, fever, night sweats or weight changes.  Cardiovascular:  No dyspnea on exertion, edema, orthopnea, palpitations, paroxysmal nocturnal dyspnea. Positive for chest pain.  Dermatological: No rash, lesions/masses Respiratory: No cough, dyspnea Urologic: No hematuria, dysuria Abdominal:   No nausea, vomiting, diarrhea, bright red blood per rectum, melena, or hematemesis Neurologic:  No visual changes, wkns, changes in mental status. All other systems reviewed and are otherwise negative except as noted above.  Physical Exam    Vitals:   10/21/17 0646 10/21/17 0730 10/21/17 0800 10/21/17 0830  BP: 123/73 122/70 100/62 104/67  Pulse: (!) 50 (!) 53 (!) 53   Resp: 12 18 16 15   Temp: 98 F (36.7 C)     TempSrc: Oral     SpO2: 96% 98% 92%   Weight: 220 lb (99.8 kg)     Height: 5\' 6"  (1.676 m)      No intake or output data in the 24 hours ending 10/21/17 0902 Filed Weights   10/21/17 0646  Weight: 220 lb (99.8 kg)   Body mass index is 35.51 kg/m.   General: Well developed, well nourished Caucasian female appearing in no acute distress. Head: Normocephalic, atraumatic, sclera non-icteric, no xanthomas, nares are without discharge. Dentition:  Neck: No carotid  bruits. JVD not elevated.  Lungs: Respirations regular and unlabored, without wheezes or rales.  Heart: Regular rate and rhythm. No S3 or S4.  No murmur, no rubs, or gallops appreciated. Abdomen: Soft, non-tender, non-distended with normoactive bowel sounds. No hepatomegaly. No rebound/guarding. No obvious abdominal masses. Msk:  Strength and tone appear normal for age. No joint deformities or effusions. Extremities: No clubbing or cyanosis. No edema.  Distal pedal pulses are 2+ bilaterally. Neuro: Alert and oriented X 3. Moves all extremities spontaneously. No focal deficits noted. Psych:  Responds to questions appropriately with a normal affect. Skin: No rashes or lesions noted  Labs and Radiology Studies    EKG:  The ECG that was done  was personally reviewed and demonstrates sinus bradycardia, HR 51, with no acute ST or T-wave changes when compared to prior tracings.   Relevant CV Studies:  Cardiac Catheterization: 11/2015  Prox RCA to Dist RCA lesion, 10% stenosed.  Prox Cx lesion, 20% stenosed.  Ost 1st Mrg lesion, 20% stenosed.  There is mild left ventricular systolic dysfunction.   1. Elevated troponin 2. Patent stents in the mid LAD/Diagonal bifurcation. The Diagonal is the larger of the 2 vessels. The LAD becomes a small caliber vessel beyond the stent.  3. Mild disease in the RCA and Circumflex.  4. Mild segmental LV systolic dysfunction (unchanged from LV gram in 2013).  5. No obvious culprit lesions. Elevated troponin possibly due to vasospasm in setting of ongoing tobacco abuse.   Recommendations: Would postpone neck cervical spine surgery given admission with elevated troponin. Plavix has been resumed. Continue ASA, Plavix, beta blocker and statin.    Laboratory Data:  Chemistry Recent Labs  Lab 10/21/17 0704  NA 137  K 3.8  CL 102  CO2 24  GLUCOSE 171*  BUN 11  CREATININE 1.10*  CALCIUM 8.8*  GFRNONAA 52*  GFRAA >60  ANIONGAP 11    Recent Labs    Lab 10/21/17 0704  PROT 7.3  ALBUMIN 3.0*  AST 22  ALT 17  ALKPHOS 121  BILITOT 0.5   Hematology Recent Labs  Lab 10/21/17 0704  WBC 8.8  RBC 4.10  HGB 11.9*  HCT 38.1  MCV 92.9  MCH 29.0  MCHC 31.2  RDW 15.3  PLT 306   Cardiac Enzymes Recent Labs  Lab 10/21/17 0704  TROPONINI <0.03   No results for input(s): TROPIPOC in the last 168 hours.  BNPNo results for input(s): BNP, PROBNP in the last 168 hours.  DDimer No results for input(s): DDIMER in the last 168 hours.  Radiology/Studies:  Dg Chest 2 View  Result Date: 10/21/2017 CLINICAL DATA:  Chest pain EXAM: CHEST - 2 VIEW COMPARISON:  November 20, 2015 FINDINGS: Lungs are clear. Heart size and pulmonary vascularity are normal. No adenopathy. No pneumothorax. No bone lesions. IMPRESSION: No edema or consolidation. Electronically Signed   By: Lowella Grip III M.D.   On: 10/21/2017 07:40    Assessment and Plan:   1. Chest Pain Concerning for Unstable Angina - the patient has known CAD having undergone bifurication stenting with DES to LAD and D1 in 2005. Most recent cath in 11/2015 performed due to NSTEMI and showed patent stents with mild disease along RCA and LCx.  - the patient had again been holding Plavix for an upcoming procedure and developed new-onset chest pain this morning which awoke her from sleep and resembled her NSTEMI from 2017. - Initial troponin negative. EKG shows sinus bradycardia, HR 51, with no acute ST or T-wave changes when compared to prior tracings. She has been started on Heparin. Discussed with Dr. Johnsie Cancel, plan for transfer to [facility] and cardiac catheterization later today. The patient understands that risks include but are not limited to stroke (1 in 1000), death (1 in 21), kidney failure [usually temporary] (1 in 500), bleeding (1 in 200), allergic reaction [possibly serious] (1 in 200).   2. HTN - BP well-controlled while in the ED.  - continue current medication regimen.   3.  HLD - FLP in 03/2017 showed total cholesterol of 135, HDL 50, and LDL 61. AT goal of LDL < 70. - continue Atorvastatin 80mg  daily.   4. Tobacco Use - still smoking 0.5 ppd. Cessation advised.    For questions or updates, please contact [place] Please consult [email or web address] for contact info under Cardiology/STEMI.   Signed, Erma Heritage, PA-C 10/21/2017, 9:02 AM Pager: [phone]

## 2017-10-21 NOTE — Progress Notes (Signed)
ANTICOAGULATION CONSULT NOTE - Initial Consult  Pharmacy Consult for Heparin Indication: chest pain/ACS  Allergies  Allergen Reactions  . Albuterol Other (See Comments)    Tremors  . Cymbalta [Duloxetine Hcl] Other (See Comments)    Felt bad  . Lyrica [Pregabalin] Other (See Comments)    Retained fluids    Patient Measurements: Height: 5\' 6"  (167.6 cm) Weight: 220 lb (99.8 kg) IBW/kg (Calculated) : 59.3 HEPARIN DW (KG): 81.8  Vital Signs: Temp: 98 F (36.7 C) (03/20 0646) Temp Source: Oral (03/20 0646) BP: 104/53 (03/20 1030) Pulse Rate: 54 (03/20 1030)  Labs: Recent Labs    10/21/17 0704  HGB 11.9*  HCT 38.1  PLT 306  CREATININE 1.10*  TROPONINI <0.03    Estimated Creatinine Clearance: 62.4 mL/min (A) (by C-G formula based on SCr of 1.1 mg/dL (H)).   Medical History: Past Medical History:  Diagnosis Date  . Anxiety   . Arthritis   . Asthma   . Coronary atherosclerosis of native coronary artery    a. s/p DES to LAD and D1 in 2005 b. cath in 11/2015 showing patent stents with mild disease along RCA and LCx  . Depression    Prior suicide attempt  . Essential hypertension, benign   . Fibromyalgia   . GERD (gastroesophageal reflux disease)   . Hypothyroidism   . NSTEMI (non-ST elevated myocardial infarction) ([place]) 2005  . Palpitations    PVC's have been noted  . Sleep apnea     Medications:  See med rec  Assessment: 62 y.o. Smoker with CAD and DES to LAD/D1 2005 patent by last cath 11/2015 Stopped plavix 6 days ago for colonoscopy she was to have tomorrow.for IBS. SSCP last week or so worse this am with dyspnea and diaphoresis. Similar to previous angina Pain relieved with nitro and morphine Now pain free on heparin Troponin negative ECG with flat ST segments in lateral leads no acute ST elevation.  Plan is to transfer to [facility] for [place] cath. Heparin started  Goal of Therapy:  Heparin level 0.3-0.7 units/ml Monitor platelets by  anticoagulation protocol: Yes   Plan:  Given 4000 units bolus x 1 Start heparin infusion at 1000 units/hr Check anti-Xa level in 6-8 hours and daily while on heparin Continue to monitor H&H and platelets  Isac Sarna, BS Vena Austria, BCPS Clinical Pharmacist Pager [phone] 10/21/2017,10:59 AM

## 2017-10-22 ENCOUNTER — Encounter (HOSPITAL_COMMUNITY)
Admission: EM | Disposition: A | Discharge status: Home / Self Care | Payer: Self-pay | Source: Home / Self Care | Attending: Emergency Medicine

## 2017-10-22 ENCOUNTER — Ambulatory Visit (HOSPITAL_COMMUNITY): Admission: RE | Admit: 2017-10-22 | Payer: Medicare HMO | Source: Ambulatory Visit | Admitting: Internal Medicine

## 2017-10-22 ENCOUNTER — Encounter (HOSPITAL_COMMUNITY): Payer: Self-pay | Admitting: Cardiovascular Disease

## 2017-10-22 DIAGNOSIS — I200 Unstable angina: Secondary | ICD-10-CM | POA: Diagnosis not present

## 2017-10-22 LAB — CBC
HCT: 35.8 % — ABNORMAL LOW (ref 36.0–46.0)
Hemoglobin: 11.2 g/dL — ABNORMAL LOW (ref 12.0–15.0)
MCH: 29 pg (ref 26.0–34.0)
MCHC: 31.3 g/dL (ref 30.0–36.0)
MCV: 92.7 fL (ref 78.0–100.0)
Platelets: 291 10*3/uL (ref 150–400)
RBC: 3.86 MIL/uL — ABNORMAL LOW (ref 3.87–5.11)
RDW: 15.5 % (ref 11.5–15.5)
WBC: 8.1 10*3/uL (ref 4.0–10.5)

## 2017-10-22 LAB — BASIC METABOLIC PANEL
Anion gap: 10 (ref 5–15)
BUN: 9 mg/dL (ref 6–20)
CO2: 23 mmol/L (ref 22–32)
Calcium: 8.8 mg/dL — ABNORMAL LOW (ref 8.9–10.3)
Chloride: 104 mmol/L (ref 101–111)
Creatinine, Ser: 1 mg/dL (ref 0.44–1.00)
GFR calc Af Amer: >60 mL/min (ref >60)
GFR calc non Af Amer: 59 mL/min — ABNORMAL LOW (ref >60)
Glucose, Bld: 132 mg/dL — ABNORMAL HIGH (ref 65–99)
Potassium: 4 mmol/L (ref 3.5–5.1)
Sodium: 137 mmol/L (ref 135–145)

## 2017-10-22 LAB — TROPONIN I
Troponin I: 3.38 ng/mL (ref <0.03)
Troponin I: 1.9 ng/mL (ref <0.03)

## 2017-10-22 LAB — ECHOCARDIOGRAM COMPLETE
Height: 66 in
Weight: 3520 oz

## 2017-10-22 SURGERY — COLONOSCOPY
Anesthesia: Moderate Sedation

## 2017-10-22 MED ORDER — ASPIRIN 81 MG PO TBEC: 81.0000 mg | DELAYED_RELEASE_TABLET | Freq: Every day | ORAL | Status: DC

## 2017-10-22 MED FILL — Lidocaine HCl Local Inj 1%: INTRAMUSCULAR | Qty: 20 | Status: AC

## 2017-10-22 MED FILL — Heparin Sodium (Porcine) 2 Unit/ML in Sodium Chloride 0.9%: INTRAMUSCULAR | Qty: 1000 | Status: AC

## 2017-10-22 NOTE — Care Management Note (Signed)
Case Management Note  Patient Details  Name: [name] MRN: [id number] Date of Birth: 31-Jul-1956  Subjective/Objective: Pt presented as a transfer from [facility] for Unstable Angina. PTA Independent from home. Plan will be to return home today.                    Action/Plan: No home needs identified.   Expected Discharge Date:  10/22/17               Expected Discharge Plan:  Home/Self Care  In-House Referral:  NA  Discharge planning Services  CM Consult  Post Acute Care Choice:  NA Choice offered to:  NA  DME Arranged:  N/A DME Agency:  NA  HH Arranged:  NA HH Agency:  NA  Status of Service:  Completed, signed off  If discussed at [place] of Stay Meetings, dates discussed:    Additional Comments:  Bethena Roys, RN 10/22/2017, 10:48 AM

## 2017-10-22 NOTE — Care Management Obs Status (Signed)
[place] NOTIFICATION   Patient Details  Name: [name] MRN: [id number] Date of Birth: [birth year]   Medicare Observation Status Notification Given:  Yes    Bethena Roys, RN 10/22/2017, 10:45 AM

## 2017-10-22 NOTE — Progress Notes (Signed)
Progress Note  Patient Name: [name] Date of Encounter: 10/22/2017  Primary Cardiologist: Rozann Lesches, MD   Subjective   Feeling well. No chest pain, sob or palpitations.   Inpatient Medications    Scheduled Meds: . aspirin EC  81 mg Oral Daily  . atorvastatin  80 mg Oral Daily  . FLUoxetine  20 mg Oral Daily  . isosorbide mononitrate  15 mg Oral Daily  . levothyroxine  125 mcg Oral QAC breakfast  . metoprolol tartrate  25 mg Oral BID  . pantoprazole  40 mg Oral Daily  . sodium chloride flush  3 mL Intravenous Q12H   Continuous Infusions: . sodium chloride     PRN Meds: sodium chloride, acetaminophen, levalbuterol, nitroGLYCERIN, ondansetron (ZOFRAN) IV, sodium chloride flush   Vital Signs    Vitals:   10/21/17 2200 10/21/17 2217 10/22/17 0423 10/22/17 0615  BP: (!) 110/53 (!) 110/53 (!) 94/44 113/62  Pulse: 65 74 (!) 55 (!) 58  Resp: 17  16 (!) 21  Temp:   (!) 97.5 F (36.4 C)   TempSrc:   Oral   SpO2: 96%  98% 99%  Weight:   220 lb 0.3 oz (99.8 kg)   Height:        Intake/Output Summary (Last 24 hours) at 10/22/2017 0746 Last data filed at 10/22/2017 0522 Gross per 24 hour  Intake 825 ml  Output 500 ml  Net 325 ml   Filed Weights   10/21/17 0646 10/21/17 1912 10/22/17 0423  Weight: 220 lb (99.8 kg) 220 lb 0.3 oz (99.8 kg) 220 lb 0.3 oz (99.8 kg)    Telemetry    SR  - Personally Reviewed  ECG    N/A  Physical Exam   GEN: No acute distress.   Neck: No JVD Cardiac: RRR, no murmurs, rubs, or gallops. R radial cath site stable.  Respiratory: Clear to auscultation bilaterally. GI: Soft, nontender, non-distended  MS: No edema; No deformity. Neuro:  Nonfocal  Psych: Normal affect   Labs    Chemistry Recent Labs  Lab 10/21/17 0704  NA 137  K 3.8  CL 102  CO2 24  GLUCOSE 171*  BUN 11  CREATININE 1.10*  CALCIUM 8.8*  PROT 7.3  ALBUMIN 3.0*  AST 22  ALT 17  ALKPHOS 121  BILITOT 0.5  GFRNONAA 52*  GFRAA >60  ANIONGAP  11     Hematology Recent Labs  Lab 10/21/17 0704 10/22/17 0634  WBC 8.8 8.1  RBC 4.10 3.86*  HGB 11.9* 11.2*  HCT 38.1 35.8*  MCV 92.9 92.7  MCH 29.0 29.0  MCHC 31.2 31.3  RDW 15.3 15.5  PLT 306 291    Cardiac Enzymes Recent Labs  Lab 10/21/17 0704 10/21/17 1955 10/21/17 2310  TROPONINI <0.03 1.65* 3.38*   No results for input(s): TROPIPOC in the last 168 hours.    Radiology    Dg Chest 2 View  Result Date: 10/21/2017 CLINICAL DATA:  Chest pain EXAM: CHEST - 2 VIEW COMPARISON:  November 20, 2015 FINDINGS: Lungs are clear. Heart size and pulmonary vascularity are normal. No adenopathy. No pneumothorax. No bone lesions. IMPRESSION: No edema or consolidation. Electronically Signed   By: Lowella Grip III M.D.   On: 10/21/2017 07:40    Cardiac Studies   LEFT HEART CATH AND CORONARY ANGIOGRAPHY  Conclusion     Prox RCA to Dist RCA lesion is 20% stenosed.  Previously placed Ost 1st Diag stent (unknown type) is widely patent.  Previously placed Prox LAD stent (unknown type) is widely patent.  LV end diastolic pressure is moderately elevated.   1.  Patent LAD/diagonal stents with no evidence of obstructive coronary artery disease. 2.  Iatrogenic myocardial contusion and microperforation caused by left ventricular angiography with manual injection.  It appears that the catheter tip was into the myocardium.  The patient had no symptoms throughout  this with no arrhythmia, instability or hypotension.    Recommendations: I am going to obtain a stat echocardiogram to ensure no pericardial effusion. Will not resume Plavix until we make sure no instability overnight. Continue medical therapy for coronary artery disease.     Patient Profile     [name] is a 62 y.o. female with past medical history of CAD (s/p DES to LAD and D1 in 2005, cath in 11/2015 showing patent stents with mild disease along RCA and LCx), HTN, HLD, tobacco use and GERD transferred from  [facility] for cath for chest pain concerning for unstable angina.    Assessment & Plan    1. Unstable angina - Treated with IV heparin. Cath showed patent stent of LAD and diag. However complicated by Iatrogenic myocardial contusion and microperforation caused by left ventricular angiography with manual injection.  It appears that the catheter tip was into the myocardium.  The patient had no symptoms throughout  this with no arrhythmia, instability or hypotension.   - Troponin trend of 0.03>>1.65>>3.38. Likely due to complications during cath. Echo done (pendign final result) but preliminary review by Dr. Fletcher Anon showed mildly reduced LV systolic function with an EF of 45-50% which is not different from before.  No evidence of pericardial effusion. - Resume aspirin, imdur, metoprolol and statin.  - She has stopped plavix 7 days ago for colonoscopy today and developed chest pain leading to admission. She had similar episode in 2017 while she held plavix for neck surgery leading to cath at that time.  - resume plavix lifelong. She is on longer interested in routine colonoscopy.  - Ambulated prior to dischage.   2. HTN - BP stable on current medication regimen.   3. HLD -03/16/2017: Cholesterol, Total 135; HDL 50; LDL Calculated 61; Triglycerides 122. - continue Atorvastatin 80mg  daily.   4. Tobacco Use - Still smoking 0.5 ppd. Cessation advised.   For questions or updates, please contact [place] Please consult [email or web address] for contact info under Cardiology/STEMI.      Jarrett Soho, PA  10/22/2017, 7:46 AM

## 2017-10-22 NOTE — Care Management CC44 (Signed)
Condition Code 44 Documentation Completed  Patient Details  Name: [name] MRN: [id number] Date of Birth: [birth year]   Condition Code 44 given:  Yes Patient signature on Condition Code 44 notice:  Yes Documentation of 2 MD's agreement:  Yes Code 44 added to claim:  Yes    Bethena Roys, RN 10/22/2017, 10:45 AM

## 2017-10-22 NOTE — Progress Notes (Signed)
[facility] BAND REMOVAL  LOCATION:    right radial  DEFLATED PER PROTOCOL:    Yes.    TIME BAND OFF / DRESSING APPLIED:    2200   SITE UPON ARRIVAL:    Level 0  SITE AFTER BAND REMOVAL:    Level 0  CIRCULATION SENSATION AND MOVEMENT:    Within Normal Limits   Yes.    COMMENTS:   Pt.tolerated procedure well

## 2017-10-22 NOTE — Discharge Summary (Signed)
Discharge Summary    Patient ID: [name],  MRN: [id number], DOB/AGE: 11-May-1956 62 y.o.  Admit date: 10/21/2017 Discharge date: 10/22/2017  Primary Care Provider: Kathyrn Drown Primary Cardiologist:  Rozann Lesches, MD   Discharge Diagnoses    Principal Problem:   Unstable angina [facility]) Active Problems:   Mixed hyperlipidemia   Essential hypertension, benign   CORONARY ATHEROSCLEROSIS NATIVE CORONARY ARTERY   Encounter for screening colonoscopy   Allergies Allergies  Allergen Reactions  . Albuterol Other (See Comments)    Tremors  . Cymbalta [Duloxetine Hcl] Other (See Comments)    Felt bad  . Lyrica [Pregabalin] Other (See Comments)    Retained fluids    Diagnostic Studies/Procedures    LEFT HEART CATH AND CORONARY ANGIOGRAPHY 10/21/17  Conclusion     Prox RCA to Dist RCA lesion is 20% stenosed.  Previously placed Ost 1st Diag stent (unknown type) is widely patent.  Previously placed Prox LAD stent (unknown type) is widely patent.  LV end diastolic pressure is moderately elevated.  1. Patent LAD/diagonal stents with no evidence of obstructive coronary artery disease. 2. Iatrogenic myocardial contusion and microperforation caused by left ventricular angiography with manual injection. It appears that the catheter tip was into the myocardium. The patient had no symptoms throughout this with no arrhythmia, instability or hypotension.   Recommendations: I am going to obtain a stat echocardiogram to ensure no pericardial effusion. Will not resume Plavix until we make sure no instability overnight. Continue medical therapy for coronary artery disease.    Echo 10/21/17 Study Conclusions  - Left ventricle: The cavity size was normal. Wall thickness was   increased in a pattern of mild LVH. Systolic function was mildly   reduced. The estimated ejection fraction was in the range of 45%   to 50%. Distal anterior and apical severe  hypokinesis. The study   is not technically sufficient to allow evaluation of LV diastolic   function. - Mitral valve: Mildly thickened leaflets . Trace to mild   regurgitation. - Left atrium: The atrium was normal in size. - Right ventricle: The cavity size was normal. Mildly reduced   systolic function. - Atrial septum: A small septal defect cannot be excluded. - Tricuspid valve: There was mild regurgitation. - Pulmonary arteries: PA peak pressure: 47 mm Hg (S). - Systemic veins: The IVC measures <2.1 cm, but does not collapse   >50%, suggesting an elevated RA pressure of 8 mmHg. - Pericardium, extracardiac: Small echolucent space noted adjacent   to the LV apex, may represent fat pad or focal, loculated   effusion. Features were not consistent with tamponade physiology.  Impressions:  - LVEF 45-50%, distal anteroapical severe hypokinesis, echolucent   crescent over the LV apex, may represent fat pat or small   loculated focal effusion, mild LVH, normal wall motion, trace to   mild MR, mild TR, RVSP 47 mmHg, IVC suggests RA pressure of 8   mmHg, cannot exclude small secundum ASD.    History of Present Illness     [name] a 63 y.o.femalewith past medical history of CAD (s/p DES to LAD and D1 in 2005, cath in 11/2015 showing patent stents with mild disease along RCA and LCx), HTN, HLD, tobacco use and GERD transferred from [facility] for cath for chest pain concerning for unstable angina.   She was evaluated by GI and was scheduled for a colonoscopy on 10/22/2017, therefore she has been holding Plavix for the past 6  days  She presented to [facility] ED on 10/21/2017 for evaluation of chest pain which started earlier in morning. She reports awaking from sleep with a pressure along her left pectoral region which radiated into her neck. Noted associated dyspnea and diaphoresis at that time. Reports symptoms are identical to what she experienced in 2017 and she immediately called  EMS. She was given sublingual nitroglycerin and IV Morphine while in the ED and is now pain-free.   She reports having dyspnea on exertion at baseline but denies any recent changes in this. No recent orthopnea, PND, or lower extremity edema. She was scheduled for a colonoscopy tomorrow but says this was for symptoms consistent with irritable bowel. Denies any recent melena or hematochezia.  Initial labs show WBC of 8.8, Hgb 11.9, platelets 306, Na+ 137, K+ 3.8, and creatinine 1.10 (close to baseline). Initial troponin negative. CXR showing no edema or consolidation. EKG shows sinus bradycardia, HR 51, with no acute ST or T-wave changes when compared to prior tracings.   Hospital Course     Consultants: None  1. Unstable angina - Treated with IV heparin. Cath showed patent stent of LAD and diag. However complicated by Iatrogenic myocardial contusion and microperforation caused by left ventricular angiography with manual injection. It appears that the catheter tip was into the myocardium. The patient had no symptoms throughout this with no arrhythmia, instability or hypotension.  - Troponin trend of 0.03>>1.65>>3.38. Likely due to complications during cath. Echo showed , LVEF 45-50% with distal anterior and severe hypokinesis. Small focal echolucent space noted over the LV apex, may represent fat pad or loculated focal pericardial effusion, suggestion of small secundum ASD - consider further outpatient imaging if relevant. .  - She has stopped plavix 7 days ago for colonoscopy and developed chest pain leading to admission. She had similar episode in 2017 while she held plavix for neck surgery leading to cath at that time.  - Resume plavix lifelong. She is on longer interested in routine colonoscopy.  - Resume home aspirin (she was taking intermittently - advised to take daily and discussed further with Dr. Domenic Polite)  imdur, metoprolol and statin.   2. HTN - BP stable on current medication  regimen.   3. HLD -03/16/2017: Cholesterol, Total 135; HDL 50; LDL Calculated 61; Triglycerides 122. - continue Atorvastatin 80mg  daily.   4. Tobacco Use - Still smoking 0.5 ppd. Cessation advised.  5. Small secundum ASD  - outpatient evaluation as needed per primary cardiologist   The patient has been seen by Dr. Debara Pickett  today and deemed ready for discharge home. All follow-up appointments have been scheduled. Discharge medications are listed below.    Discharge Vitals Blood pressure (!) 101/55, pulse 60, temperature 98.1 F (36.7 C), temperature source Oral, resp. rate 19, height 5\' 6"  (1.676 m), weight 220 lb 0.3 oz (99.8 kg), SpO2 98 %.  Filed Weights   10/21/17 0646 10/21/17 1912 10/22/17 0423  Weight: 220 lb (99.8 kg) 220 lb 0.3 oz (99.8 kg) 220 lb 0.3 oz (99.8 kg)    Labs & Radiologic Studies     CBC Recent Labs    10/21/17 0704 10/22/17 0634  WBC 8.8 8.1  NEUTROABS 5.5  --   HGB 11.9* 11.2*  HCT 38.1 35.8*  MCV 92.9 92.7  PLT 306 244   Basic Metabolic Panel Recent Labs    10/21/17 0704 10/22/17 0634  NA 137 137  K 3.8 4.0  CL 102 104  CO2 24 23  GLUCOSE 171* 132*  BUN 11 9  CREATININE 1.10* 1.00  CALCIUM 8.8* 8.8*   Liver Function Tests Recent Labs    10/21/17 0704  AST 22  ALT 17  ALKPHOS 121  BILITOT 0.5  PROT 7.3  ALBUMIN 3.0*   No results for input(s): LIPASE, AMYLASE in the last 72 hours. Cardiac Enzymes Recent Labs    10/21/17 1955 10/21/17 2310 10/22/17 0634  TROPONINI 1.65* 3.38* 1.90*    Dg Chest 2 View  Result Date: 10/21/2017 CLINICAL DATA:  Chest pain EXAM: CHEST - 2 VIEW COMPARISON:  November 20, 2015 FINDINGS: Lungs are clear. Heart size and pulmonary vascularity are normal. No adenopathy. No pneumothorax. No bone lesions. IMPRESSION: No edema or consolidation. Electronically Signed   By: Lowella Grip III M.D.   On: 10/21/2017 07:40    Disposition   Pt is being discharged home today in good condition.  Follow-up  Plans & Appointments    Follow-up Information    [facility], [place]. Go on 11/13/2017.   Specialties:  Physician Assistant, Cardiology Why:  @noon  for hospital follow up with Dr. Myles Gip PA. Please arrive 15 minutes early Contact information: [address] [place] [address] [phone]          Discharge Instructions    Diet - low sodium heart healthy   Complete by:  As directed    Discharge instructions   Complete by:  As directed    No driving for 48 hours. No lifting over 5 lbs for 1 week. No sexual activity for 1 week.  Keep procedure site clean & dry. If you notice increased pain, swelling, bleeding or pus, call/return!  You may shower, but no soaking baths/hot tubs/pools for 1 week.   Increase activity slowly   Complete by:  As directed       Discharge Medications    Allergies as of 10/22/2017      Reactions   Albuterol Other (See Comments)   Tremors   Cymbalta [duloxetine Hcl] Other (See Comments)   Felt bad   Lyrica [pregabalin] Other (See Comments)   Retained fluids      Medication List    TAKE these medications   aspirin 81 MG EC tablet Take 1 tablet (81 mg total) by mouth daily. Start taking on:  10/23/2017 Notes to patient:  Prevents clotting in stent and heart attack   atorvastatin 80 MG tablet Commonly known as:  LIPITOR TAKE 1 TABLET ONE TIME DAILY What changed:    how much to take  how to take this  when to take this  additional instructions   clopidogrel 75 MG tablet Commonly known as:  PLAVIX TAKE 1 TABLET ONE TIME DAILY What changed:  See the new instructions.   FLUoxetine 20 MG capsule Commonly known as:  PROZAC TAKE 2 CAPSULES EVERY DAY What changed:    how much to take  how to take this  when to take this   hyoscyamine 0.125 MG SL tablet Commonly known as:  LEVSIN SL Place 1 tablet (0.125 mg total) under the tongue every 4 (four) hours as needed.   isosorbide mononitrate 30 MG 24 hr  tablet Commonly known as:  IMDUR TAKE 1/2 TABLET ONE TIME DAILY What changed:    how much to take  how to take this  when to take this  additional instructions   levalbuterol 45 MCG/ACT inhaler Commonly known as:  XOPENEX HFA Inhale 2 puffs into the lungs every 4 (four)  hours as needed for wheezing.   levothyroxine 125 MCG tablet Commonly known as:  SYNTHROID, LEVOTHROID TAKE 1 TABLET (125 MCG TOTAL) BY MOUTH DAILY.   metoprolol tartrate 25 MG tablet Commonly known as:  LOPRESSOR TAKE 1 TABLET TWICE DAILY What changed:    how much to take  how to take this  when to take this   nitroGLYCERIN 0.4 MG SL tablet Commonly known as:  NITROSTAT PLACE ONE (1) TABLET UNDER TONGUE EVERY 5 MINUTES UP TO (3) DOSES AS NEEDED FOR CHEST PAIN. What changed:    how much to take  how to take this  when to take this  reasons to take this  additional instructions   pantoprazole 40 MG tablet Commonly known as:  PROTONIX TAKE 1 TABLET DAILY FOR ACID REFLUX. What changed:    how much to take  how to take this  when to take this  additional instructions   VITAMIN B-12 PO Take 1 tablet by mouth daily.   cyanocobalamin 1000 MCG/ML injection Commonly known as:  (VITAMIN B-12) Inject 1,000 mcg into the muscle every 30 (thirty) days.         Outstanding Labs/Studies   None  Duration of Discharge Encounter   Greater than 30 minutes including physician time.  Signed, Crista Luria Ty Buntrock PA-C 10/22/2017, 10:35 AM

## 2017-10-28 ENCOUNTER — Encounter: Payer: Self-pay | Admitting: Cardiology

## 2017-10-28 NOTE — Progress Notes (Signed)
Cardiology Office Note  Date: 10/29/2017   ID: [name], DOB Jun 22, 1956, MRN [id number]  PCP: Kathyrn Drown, MD  Primary Cardiologist: Rozann Lesches, MD   Chief Complaint  Patient presents with  . Coronary Artery Disease    History of Present Illness: [name] is a 62 y.o. female last seen in April 2018.  Records indicate recent hospitalization in late March with suspected unstable angina.  She had been off Plavix in anticipation of a colonoscopy.  Initial cardiac enzymes were negative for ACS.  She underwent cardiac catheterization that actually demonstrated patent stent sites within the LAD and diagonal.  She did have iatrogenic myocardial contusion with microperforation related to position of catheter tip at left ventriculography.  Subsequent troponin levels went up to 3.38.  She was started back on regular medications including aspirin and Plavix and no other further cardiac intervention was required.  She presents today for follow-up.  I discussed her catheterization results with her.  This is the second time that she has presented with chest pain after holding Plavix in anticipation of a procedure, however on neither occasion did she actually have stent thrombosis, and it is not entirely clear to me why she was having chest pain.  She has however very hesitant to hold Plavix again which will limit future procedures.  We went over her medications.  She states that she is forgetting to take her second dose of Lopressor in the afternoon, so we will change this to Toprol-XL.  Past Medical History:  Diagnosis Date  . Anxiety   . Arthritis   . Asthma   . Coronary atherosclerosis of native coronary artery    a. s/p DES to LAD and D1 in 2005 b. cath in 11/2015 showing patent stents with mild disease along RCA and LCx  . Depression    Prior suicide attempt  . Essential hypertension   . Fibromyalgia   . GERD (gastroesophageal reflux disease)   . History of blood  transfusion 2005  . History of kidney stones   . Hyperlipidemia   . Hypothyroidism   . Myocardial infarction ([place]) 2017  . NSTEMI (non-ST elevated myocardial infarction) ([place]) 2005  . Palpitations    PVC's have been noted  . Sleep apnea     Past Surgical History:  Procedure Laterality Date  . CARDIAC CATHETERIZATION  ~ 2009/2010  . CARDIAC CATHETERIZATION N/A 11/21/2015   Procedure: Left Heart Cath and Coronary Angiography;  Surgeon: Burnell Blanks, MD;  Location: [facility];  Service: Cardiovascular;  Laterality: N/A;  . CARDIAC CATHETERIZATION  10/21/2017  . CARPAL TUNNEL RELEASE Bilateral 2007- 2008  . CORONARY ANGIOPLASTY WITH STENT PLACEMENT  2005  . FOOT BONE EXCISION     Sesmoid bone left foot-repair of fracture  . LEFT HEART CATH AND CORONARY ANGIOGRAPHY N/A 10/21/2017   Procedure: LEFT HEART CATH AND CORONARY ANGIOGRAPHY;  Surgeon: Wellington Hampshire, MD;  Location: [facility];  Service: Cardiovascular;  Laterality: N/A;  . TUBAL LIGATION      Current Outpatient Medications  Medication Sig Dispense Refill  . aspirin EC 81 MG EC tablet Take 1 tablet (81 mg total) by mouth daily.    Marland Kitchen atorvastatin (LIPITOR) 80 MG tablet TAKE 1 TABLET ONE TIME DAILY (Patient taking differently: Take 80 mg by mouth daily. ) 90 tablet 2  . clopidogrel (PLAVIX) 75 MG tablet TAKE 1 TABLET ONE TIME DAILY (Patient taking differently: TAKE 75 MG BY MOUTH ONE  TIME DAILY) 90 tablet 2  . cyanocobalamin (,VITAMIN B-12,) 1000 MCG/ML injection Inject 1,000 mcg into the muscle every 30 (thirty) days.    . Cyanocobalamin (VITAMIN B-12 PO) Take 1 tablet by mouth daily.    Marland Kitchen FLUoxetine (PROZAC) 20 MG tablet Take 20 mg by mouth daily.    . isosorbide mononitrate (IMDUR) 30 MG 24 hr tablet TAKE 1/2 TABLET ONE TIME DAILY (Patient taking differently: Take 15 mg by mouth daily. ) 45 tablet 2  . levalbuterol (XOPENEX HFA) 45 MCG/ACT inhaler Inhale 2 puffs into the lungs every 4 (four) hours as  needed for wheezing. 1 Inhaler 12  . levothyroxine (SYNTHROID, LEVOTHROID) 125 MCG tablet TAKE 1 TABLET (125 MCG TOTAL) BY MOUTH DAILY. 90 tablet 1  . nitroGLYCERIN (NITROSTAT) 0.4 MG SL tablet PLACE ONE (1) TABLET UNDER TONGUE EVERY 5 MINUTES UP TO (3) DOSES AS NEEDED FOR CHEST PAIN. (Patient taking differently: Place 0.4 mg under the tongue every 5 (five) minutes as needed for chest pain. ) 25 tablet 3  . pantoprazole (PROTONIX) 40 MG tablet TAKE 1 TABLET DAILY FOR ACID REFLUX. (Patient taking differently: Take 40 mg by mouth daily. ) 90 tablet 1  . metoprolol succinate (TOPROL-XL) 50 MG 24 hr tablet Take 1 tablet (50 mg total) by mouth daily. Take with or immediately following a meal. 90 tablet 0   No current facility-administered medications for this visit.    Allergies:  Albuterol; Cymbalta [duloxetine hcl]; and Lyrica [pregabalin]   Social History: The patient  reports that she has been smoking cigarettes.  She has a 16.17 pack-year smoking history. She has never used smokeless tobacco. She reports that she does not drink alcohol or use drugs.   ROS:  Please see the history of present illness. Otherwise, complete review of systems is positive for none.  All other systems are reviewed and negative.   Physical Exam: VS:  BP 112/77   Pulse (!) 54   Ht 5\' 6"  (1.676 m)   Wt 225 lb (102.1 kg)   SpO2 97%   BMI 36.32 kg/m , BMI Body mass index is 36.32 kg/m.  Wt Readings from Last 3 Encounters:  10/29/17 225 lb (102.1 kg)  10/22/17 220 lb 0.3 oz (99.8 kg)  10/08/17 221 lb 12.8 oz (100.6 kg)    General: Patient appears comfortable at rest. HEENT: Conjunctiva and lids normal, oropharynx clear. Neck: Supple, no elevated JVP or carotid bruits, no thyromegaly. Lungs: Clear to auscultation, nonlabored breathing at rest. Cardiac: Regular rate and rhythm, no S3 or significant systolic murmur, no pericardial rub. Abdomen: Soft, nontender, bowel sounds present. Extremities: No pitting edema,  distal pulses 2+.  Small nontender knot right radial access site. Skin: Warm and dry.  Full tattoos to both arms. Musculoskeletal: No kyphosis. Neuropsychiatric: Alert and oriented x3, affect grossly appropriate.  ECG: I personally reviewed the tracing from 10/21/2017 which showed sinus rhythm with possible old anteroseptal infarct pattern.  Recent Labwork: 03/16/2017: TSH 0.455 10/21/2017: ALT 17; AST 22 10/22/2017: BUN 9; Creatinine, Ser 1.00; Hemoglobin 11.2; Platelets 291; Potassium 4.0; Sodium 137     Component Value Date/Time   CHOL 135 03/16/2017 1110   TRIG 122 03/16/2017 1110   HDL 50 03/16/2017 1110   CHOLHDL 2.7 03/16/2017 1110   CHOLHDL 3.6 11/16/2013 0752   VLDL 24 11/16/2013 0752   LDLCALC 61 03/16/2017 1110    Other Studies Reviewed Today:  Echocardiogram 10/21/2017: Study Conclusions  - Left ventricle: The cavity size was normal. Wall  thickness was   increased in a pattern of mild LVH. Systolic function was mildly   reduced. The estimated ejection fraction was in the range of 45%   to 50%. Distal anterior and apical severe hypokinesis. The study   is not technically sufficient to allow evaluation of LV diastolic   function. - Mitral valve: Mildly thickened leaflets . Trace to mild   regurgitation. - Left atrium: The atrium was normal in size. - Right ventricle: The cavity size was normal. Mildly reduced   systolic function. - Atrial septum: A small septal defect cannot be excluded. - Tricuspid valve: There was mild regurgitation. - Pulmonary arteries: PA peak pressure: 47 mm Hg (S). - Systemic veins: The IVC measures <2.1 cm, but does not collapse   >50%, suggesting an elevated RA pressure of 8 mmHg. - Pericardium, extracardiac: Small echolucent space noted adjacent   to the LV apex, may represent fat pad or focal, loculated   effusion. Features were not consistent with tamponade physiology.  Impressions:  - LVEF 45-50%, distal anteroapical severe  hypokinesis, echolucent   crescent over the LV apex, may represent fat pat or small   loculated focal effusion, mild LVH, normal wall motion, trace to   mild MR, mild TR, RVSP 47 mmHg, IVC suggests RA pressure of 8   mmHg, cannot exclude small secundum ASD.  Cardiac catheterization 10/21/2017:  Prox RCA to Dist RCA lesion is 20% stenosed.  Previously placed Ost 1st Diag stent (unknown type) is widely patent.  Previously placed Prox LAD stent (unknown type) is widely patent.  LV end diastolic pressure is moderately elevated.   1.  Patent LAD/diagonal stents with no evidence of obstructive coronary artery disease. 2.  Iatrogenic myocardial contusion and microperforation caused by left ventricular angiography with manual injection.  It appears that the catheter tip was into the myocardium.  The patient had no symptoms throughout  this with no arrhythmia, instability or hypotension.    Assessment and Plan:  1.  CAD with history of DES to the LAD and first diagonal in 2005.  Recent cardiac catheterization done in the setting of chest pain demonstrated patent stent sites and only mild right-sided disease.  Not entirely clear why she manifested chest pain while off Plavix, did not have stent thrombosis.  She will continue on dual antiplatelet therapy indefinitely.  2.  Iatrogenic myocardial contusion/microperforation related to catheter position as documented in the cardiac catheterization report.  Patient is asymptomatic, did not require specific intervention.  LVEF was 45-50% by echocardiogram.  We will continue medical therapy and follow-up with an echocardiogram prior to her next visit in three months.  3.  Mixed hyperlipidemia, continues on Lipitor.  4.  Essential hypertension, blood pressure well controlled today.  Current medicines were reviewed with the patient today.   Orders Placed This Encounter  Procedures  . ECHOCARDIOGRAM COMPLETE    Disposition: Follow-up in 3  months.  Signed, Satira Sark, MD, [facility] 10/29/2017 3:46 PM    [facility] [place], [place], [place] [address] Phone: [phone]; Fax: [phone]

## 2017-10-29 ENCOUNTER — Ambulatory Visit: Payer: Medicare HMO | Admitting: Cardiology

## 2017-10-29 ENCOUNTER — Encounter: Payer: Self-pay | Admitting: Cardiology

## 2017-10-29 ENCOUNTER — Telehealth: Payer: Self-pay | Admitting: Cardiology

## 2017-10-29 VITALS — BP 112/77 | HR 54 | Ht 66.0 in | Wt 225.0 lb

## 2017-10-29 DIAGNOSIS — I429 Cardiomyopathy, unspecified: Secondary | ICD-10-CM | POA: Diagnosis not present

## 2017-10-29 DIAGNOSIS — I10 Essential (primary) hypertension: Secondary | ICD-10-CM

## 2017-10-29 DIAGNOSIS — I25119 Atherosclerotic heart disease of native coronary artery with unspecified angina pectoris: Secondary | ICD-10-CM

## 2017-10-29 DIAGNOSIS — E782 Mixed hyperlipidemia: Secondary | ICD-10-CM | POA: Diagnosis not present

## 2017-10-29 MED ORDER — METOPROLOL SUCCINATE ER 50 MG PO TB24: 50.0000 mg | ORAL_TABLET | Freq: Every day | ORAL | 0 refills | Status: DC

## 2017-10-29 NOTE — Patient Instructions (Signed)
Medication Instructions:  Your physician has recommended you make the following change in your medication:    STOP Lopressor   START Toprol XL 50 mg daily   Please continue all other medications as prescribed  Labwork: NONE  Testing/Procedures: Your physician has requested that you have an echocardiogram IN 3 MONTHS. Echocardiography is a painless test that uses sound waves to create images of your heart. It provides your doctor with information about the size and shape of your heart and how well your heart's chambers and valves are working. This procedure takes approximately one hour. There are no restrictions for this procedure.  Follow-Up: Your physician recommends that you schedule a follow-up appointment in: 3 MONTHS WITH DR. MCDOWELL  Any Other Special Instructions Will Be Listed Below (If Applicable).  If you need a refill on your cardiac medications before your next appointment, please call your pharmacy.

## 2017-10-29 NOTE — Telephone Encounter (Signed)
Pre-cert Verification for the following procedure   Echo scheduled for 01/21/2018

## 2017-11-13 ENCOUNTER — Ambulatory Visit: Payer: Medicare HMO | Admitting: Student

## 2017-11-23 ENCOUNTER — Encounter: Payer: Self-pay | Admitting: Nurse Practitioner

## 2017-11-23 ENCOUNTER — Ambulatory Visit (HOSPITAL_COMMUNITY)
Admission: RE | Admit: 2017-11-23 | Discharge: 2017-11-23 | Disposition: A | Discharge status: Home / Self Care | Payer: Medicare HMO | Source: Ambulatory Visit | Attending: Nurse Practitioner | Admitting: Nurse Practitioner

## 2017-11-23 ENCOUNTER — Encounter: Payer: Self-pay | Admitting: Family Medicine

## 2017-11-23 ENCOUNTER — Ambulatory Visit (INDEPENDENT_AMBULATORY_CARE_PROVIDER_SITE_OTHER): Payer: Medicare HMO | Admitting: Nurse Practitioner

## 2017-11-23 VITALS — BP 124/74 | Ht 66.0 in | Wt 227.0 lb

## 2017-11-23 DIAGNOSIS — E538 Deficiency of other specified B group vitamins: Secondary | ICD-10-CM

## 2017-11-23 DIAGNOSIS — M79672 Pain in left foot: Secondary | ICD-10-CM | POA: Diagnosis not present

## 2017-11-23 DIAGNOSIS — Z780 Asymptomatic menopausal state: Secondary | ICD-10-CM

## 2017-11-23 MED ORDER — CYANOCOBALAMIN 1000 MCG/ML IJ SOLN
1000.0000 ug | Freq: Once | INTRAMUSCULAR | Status: AC
  Administered 2017-11-23: 1000 ug via INTRAMUSCULAR

## 2017-11-23 NOTE — Progress Notes (Signed)
Subjective:  Presents for c/o left foot pain, worse x 1 week. Some edema. Numbness in some of her toes. No history of injury but has had surgery on this foot with removal of the sesmoid bone due to fracture many years ago. Minimal pain in the left foot. On Plavix, so limited on medications she can take. Is off all opiates. Vaps using high quality CBD oil which has helped pain and anxiety. Requests referral to podiatry. Worked with a local podiatrist for about 7 years at one point. Has not had a bone density study.   Objective:   BP 124/74   Ht 5\' 6"  (1.676 m)   Wt 227 lb (103 kg)   BMI 36.64 kg/m  NAD. Alert, oriented. Significant edema noted at the bilateral malleolus of the left ankle. Less on the right. No erythema or warmth. Also localized tenderness and edema at the base of the toes. No mass or nodule noted. Monofilament test: decreased sensation at the bottom of the left great toe, otherwise normal sensory exam. Strong DP pulse, toes warm with normal cap refill. Full passive ROM performed with minimal tenderness.   Assessment:   Problem List Items Addressed This Visit      Other   B12 deficiency   Relevant Medications   cyanocobalamin ((VITAMIN B-12)) injection 1,000 mcg (Completed)    Other Visit Diagnoses    Left foot pain    -  Primary   Relevant Orders   DG Foot Complete Left   Postmenopausal       Relevant Orders   DG Bone Density       Plan:  Patient requests her B12 injection today. Recommend repeat B12 at her next regular visit. Refer to podiatry for evaluation. Call back sooner if worse. also schedule bone density. Xray today to rule out stress fracture.

## 2017-11-24 ENCOUNTER — Telehealth: Payer: Self-pay | Admitting: *Deleted

## 2017-11-24 ENCOUNTER — Other Ambulatory Visit: Payer: Self-pay | Admitting: *Deleted

## 2017-11-24 MED ORDER — LEVALBUTEROL TARTRATE 45 MCG/ACT IN AERO
2.0000 | INHALATION_SPRAY | RESPIRATORY_TRACT | 2 refills | Status: DC | PRN
Start: 2017-11-24 — End: 2017-11-27

## 2017-11-24 NOTE — Telephone Encounter (Signed)
Please check with patient first. She was on Xopenex because albuterol caused tremors.

## 2017-11-24 NOTE — Telephone Encounter (Signed)
I called left a message to r/c. 

## 2017-11-24 NOTE — Telephone Encounter (Signed)
Fax from [facility]. xopenex not covered. Can it be switched to the formulary product ventolin

## 2017-11-25 NOTE — Telephone Encounter (Signed)
Patient does not want to change to the formulary, she states she will call her insurance company to see if there is something she can do. She will let us know what she wants [place] to do.

## 2017-11-27 ENCOUNTER — Other Ambulatory Visit: Payer: Self-pay | Admitting: Nurse Practitioner

## 2017-11-27 DIAGNOSIS — G5792 Unspecified mononeuropathy of left lower limb: Secondary | ICD-10-CM | POA: Diagnosis not present

## 2017-11-27 DIAGNOSIS — M19072 Primary osteoarthritis, left ankle and foot: Secondary | ICD-10-CM | POA: Diagnosis not present

## 2017-11-27 DIAGNOSIS — M7752 Other enthesopathy of left foot: Secondary | ICD-10-CM | POA: Diagnosis not present

## 2017-11-27 MED ORDER — ALBUTEROL SULFATE HFA 108 (90 BASE) MCG/ACT IN AERS: 2.0000 | INHALATION_SPRAY | RESPIRATORY_TRACT | 2 refills | Status: DC | PRN

## 2017-12-03 ENCOUNTER — Ambulatory Visit (HOSPITAL_COMMUNITY)
Admission: RE | Admit: 2017-12-03 | Discharge: 2017-12-03 | Disposition: A | Discharge status: Home / Self Care | Payer: Medicare HMO | Source: Ambulatory Visit | Attending: Nurse Practitioner | Admitting: Nurse Practitioner

## 2017-12-03 DIAGNOSIS — Z780 Asymptomatic menopausal state: Secondary | ICD-10-CM | POA: Diagnosis not present

## 2017-12-03 DIAGNOSIS — Z13820 Encounter for screening for osteoporosis: Secondary | ICD-10-CM | POA: Diagnosis not present

## 2017-12-07 ENCOUNTER — Ambulatory Visit: Payer: Medicare HMO | Admitting: Podiatry

## 2017-12-08 ENCOUNTER — Telehealth: Payer: Self-pay

## 2017-12-08 NOTE — Telephone Encounter (Signed)
Patient is aware Xopenex  Hfa 45 mcg approved until 08/03/2018. She will go by pharmacy to pick up and make [place] aware of any problems doing so.

## 2017-12-10 DIAGNOSIS — G8929 Other chronic pain: Secondary | ICD-10-CM | POA: Diagnosis not present

## 2017-12-10 DIAGNOSIS — R202 Paresthesia of skin: Secondary | ICD-10-CM | POA: Diagnosis not present

## 2017-12-10 DIAGNOSIS — G6289 Other specified polyneuropathies: Secondary | ICD-10-CM | POA: Diagnosis not present

## 2017-12-10 DIAGNOSIS — G5603 Carpal tunnel syndrome, bilateral upper limbs: Secondary | ICD-10-CM | POA: Diagnosis not present

## 2017-12-10 DIAGNOSIS — M5417 Radiculopathy, lumbosacral region: Secondary | ICD-10-CM | POA: Diagnosis not present

## 2017-12-10 DIAGNOSIS — R201 Hypoesthesia of skin: Secondary | ICD-10-CM | POA: Diagnosis not present

## 2017-12-10 DIAGNOSIS — M5412 Radiculopathy, cervical region: Secondary | ICD-10-CM | POA: Diagnosis not present

## 2017-12-24 DIAGNOSIS — M5417 Radiculopathy, lumbosacral region: Secondary | ICD-10-CM | POA: Diagnosis not present

## 2017-12-24 DIAGNOSIS — G8929 Other chronic pain: Secondary | ICD-10-CM | POA: Diagnosis not present

## 2017-12-24 DIAGNOSIS — G5603 Carpal tunnel syndrome, bilateral upper limbs: Secondary | ICD-10-CM | POA: Diagnosis not present

## 2017-12-24 DIAGNOSIS — G6289 Other specified polyneuropathies: Secondary | ICD-10-CM | POA: Diagnosis not present

## 2017-12-24 DIAGNOSIS — M542 Cervicalgia: Secondary | ICD-10-CM | POA: Diagnosis not present

## 2017-12-31 ENCOUNTER — Telehealth: Payer: Self-pay | Admitting: Family Medicine

## 2017-12-31 DIAGNOSIS — M79672 Pain in left foot: Secondary | ICD-10-CM

## 2017-12-31 NOTE — Telephone Encounter (Signed)
Pt was seen by Dr. Leonette Nutting (podiatry) 11/27/17  His office needs [place] to send a retro referral so that her insurance will cover the visit  Please initiate new referral in system so that I may process  Dr. Leonette Nutting Ph# [phone] Fx# [phone]

## 2018-01-01 NOTE — Telephone Encounter (Signed)
Please talk with the patient, identify why was the patient seen by podiatry.  Put that as the reason for the referral

## 2018-01-01 NOTE — Telephone Encounter (Signed)
Referral ordered in Epic. 

## 2018-01-01 NOTE — Addendum Note (Signed)
Addended by: Dairl Ponder on: 01/01/2018 10:04 AM   Modules accepted: Orders

## 2018-01-10 DIAGNOSIS — Z7902 Long term (current) use of antithrombotics/antiplatelets: Secondary | ICD-10-CM | POA: Diagnosis not present

## 2018-01-10 DIAGNOSIS — Z79899 Other long term (current) drug therapy: Secondary | ICD-10-CM | POA: Diagnosis not present

## 2018-01-10 DIAGNOSIS — Z7982 Long term (current) use of aspirin: Secondary | ICD-10-CM | POA: Diagnosis not present

## 2018-01-10 DIAGNOSIS — M797 Fibromyalgia: Secondary | ICD-10-CM | POA: Diagnosis not present

## 2018-01-10 DIAGNOSIS — Z955 Presence of coronary angioplasty implant and graft: Secondary | ICD-10-CM | POA: Diagnosis not present

## 2018-01-10 DIAGNOSIS — E7800 Pure hypercholesterolemia, unspecified: Secondary | ICD-10-CM | POA: Diagnosis not present

## 2018-01-10 DIAGNOSIS — L209 Atopic dermatitis, unspecified: Secondary | ICD-10-CM | POA: Diagnosis not present

## 2018-01-14 ENCOUNTER — Other Ambulatory Visit: Payer: Self-pay | Admitting: Family Medicine

## 2018-01-21 ENCOUNTER — Ambulatory Visit (INDEPENDENT_AMBULATORY_CARE_PROVIDER_SITE_OTHER): Payer: Medicare HMO

## 2018-01-21 ENCOUNTER — Other Ambulatory Visit: Payer: Self-pay

## 2018-01-21 ENCOUNTER — Other Ambulatory Visit: Payer: Self-pay | Admitting: Cardiology

## 2018-01-21 DIAGNOSIS — I429 Cardiomyopathy, unspecified: Secondary | ICD-10-CM | POA: Diagnosis not present

## 2018-01-21 MED ORDER — METOPROLOL SUCCINATE ER 50 MG PO TB24: 50.0000 mg | ORAL_TABLET | Freq: Every day | ORAL | 3 refills | Status: DC

## 2018-01-21 NOTE — Telephone Encounter (Signed)
Patient needs to have (TOPROL-XL) 50 MG 24 hr tablet re-submitted to [facility].  States that [employer] approved but she never got medication

## 2018-01-21 NOTE — Telephone Encounter (Signed)
Done

## 2018-01-26 ENCOUNTER — Telehealth: Payer: Self-pay

## 2018-01-26 NOTE — Telephone Encounter (Signed)
Patient notified. Routed to PCP 

## 2018-01-26 NOTE — Telephone Encounter (Signed)
-----   Message from Erma Heritage, [place] sent at 01/23/2018  9:56 AM EDT ----- Covering for Dr. Domenic Polite - Please let the patient know her echocardiogram showed the pumping function of her heart has normalized as EF is now within a normal range. Please forward a copy to Kathyrn Drown, MD. Thank you.

## 2018-01-28 NOTE — Progress Notes (Deleted)
Cardiology Office Note  Date: 01/28/2018   ID: [name], DOB [birth year], MRN [id number]  PCP: Kathyrn Drown, MD  Primary Cardiologist: Rozann Lesches, MD   No chief complaint on file.   History of Present Illness: [name] is a 62 y.o. female last seen in March.  Recent follow-up echocardiogram showed normalization of LVEF in the range of 55 to 60%.  Past Medical History:  Diagnosis Date  . Anxiety   . Arthritis   . Asthma   . Coronary atherosclerosis of native coronary artery    a. s/p DES to LAD and D1 in 2005 b. cath in 11/2015 showing patent stents with mild disease along RCA and LCx  . Depression    Prior suicide attempt  . Essential hypertension   . Fibromyalgia   . GERD (gastroesophageal reflux disease)   . History of blood transfusion 2005  . History of kidney stones   . Hyperlipidemia   . Hypothyroidism   . Myocardial infarction ([place]) 2017  . NSTEMI (non-ST elevated myocardial infarction) ([place]) 2005  . Palpitations    PVC's have been noted  . Sleep apnea     Past Surgical History:  Procedure Laterality Date  . CARDIAC CATHETERIZATION  ~ 2009/2010  . CARDIAC CATHETERIZATION N/A 11/21/2015   Procedure: Left Heart Cath and Coronary Angiography;  Surgeon: Burnell Blanks, MD;  Location: [facility];  Service: Cardiovascular;  Laterality: N/A;  . CARDIAC CATHETERIZATION  10/21/2017  . CARPAL TUNNEL RELEASE Bilateral 2007- 2008  . CORONARY ANGIOPLASTY WITH STENT PLACEMENT  2005  . FOOT BONE EXCISION     Sesmoid bone left foot-repair of fracture  . LEFT HEART CATH AND CORONARY ANGIOGRAPHY N/A 10/21/2017   Procedure: LEFT HEART CATH AND CORONARY ANGIOGRAPHY;  Surgeon: Wellington Hampshire, MD;  Location: [facility];  Service: Cardiovascular;  Laterality: N/A;  . TUBAL LIGATION      Current Outpatient Medications  Medication Sig Dispense Refill  . albuterol (PROVENTIL HFA;VENTOLIN HFA) 108 (90 Base) MCG/ACT inhaler  Inhale 2 puffs into the lungs every 4 (four) hours as needed. 1 Inhaler 2  . aspirin EC 81 MG EC tablet Take 1 tablet (81 mg total) by mouth daily.    Marland Kitchen atorvastatin (LIPITOR) 80 MG tablet TAKE 1 TABLET ONE TIME DAILY (Patient taking differently: Take 80 mg by mouth daily. ) 90 tablet 2  . clopidogrel (PLAVIX) 75 MG tablet TAKE 1 TABLET ONE TIME DAILY (Patient taking differently: TAKE 75 MG BY MOUTH ONE TIME DAILY) 90 tablet 2  . cyanocobalamin (,VITAMIN B-12,) 1000 MCG/ML injection Inject 1,000 mcg into the muscle every 30 (thirty) days.    . Cyanocobalamin (VITAMIN B-12 PO) Take 1 tablet by mouth daily.    Marland Kitchen FLUoxetine (PROZAC) 20 MG tablet Take 20 mg by mouth daily.    . isosorbide mononitrate (IMDUR) 30 MG 24 hr tablet TAKE 1/2 TABLET ONE TIME DAILY (Patient taking differently: Take 15 mg by mouth daily. ) 45 tablet 2  . levothyroxine (SYNTHROID, LEVOTHROID) 125 MCG tablet TAKE 1 TABLET EVERY DAY 90 tablet 1  . metoprolol succinate (TOPROL-XL) 50 MG 24 hr tablet Take 1 tablet (50 mg total) by mouth daily. Take with or immediately following a meal. 90 tablet 3  . nitroGLYCERIN (NITROSTAT) 0.4 MG SL tablet DISSOLVE 1 TABLET UNDER TONGUE EVERY 5 MINUTES UP TO 3 DOSES AS NEEDED FOR CHEST PAIN. 25 tablet 3  . pantoprazole (PROTONIX) 40 MG tablet TAKE  1 TABLET DAILY FOR ACID REFLUX. 90 tablet 1   No current facility-administered medications for this visit.    Allergies:  Albuterol; Cymbalta [duloxetine hcl]; and Lyrica [pregabalin]   Social History: The patient  reports that she has been smoking cigarettes.  She has a 16.17 pack-year smoking history. She has never used smokeless tobacco. She reports that she does not drink alcohol or use drugs.   Family History: The patient's family history includes Coronary artery disease in her father; Heart failure in her father and mother.   ROS:  Please see the history of present illness. Otherwise, complete review of systems is positive for {NONE  DEFAULTED:18576::"none"}.  All other systems are reviewed and negative.   Physical Exam: VS:  There were no vitals taken for this visit., BMI There is no height or weight on file to calculate BMI.  Wt Readings from Last 3 Encounters:  11/23/17 227 lb (103 kg)  10/29/17 225 lb (102.1 kg)  10/22/17 220 lb 0.3 oz (99.8 kg)    General: Patient appears comfortable at rest. HEENT: Conjunctiva and lids normal, oropharynx clear with moist mucosa. Neck: Supple, no elevated JVP or carotid bruits, no thyromegaly. Lungs: Clear to auscultation, nonlabored breathing at rest. Cardiac: Regular rate and rhythm, no S3 or significant systolic murmur, no pericardial rub. Abdomen: Soft, nontender, no hepatomegaly, bowel sounds present, no guarding or rebound. Extremities: No pitting edema, distal pulses 2+. Skin: Warm and dry. Musculoskeletal: No kyphosis. Neuropsychiatric: Alert and oriented x3, affect grossly appropriate.  ECG: I personally reviewed the tracing from 10/21/2017 which showed sinus rhythm with possible old anteroseptal infarct pattern.  Recent Labwork: 03/16/2017: TSH 0.455 10/21/2017: ALT 17; AST 22 10/22/2017: BUN 9; Creatinine, Ser 1.00; Hemoglobin 11.2; Platelets 291; Potassium 4.0; Sodium 137     Component Value Date/Time   CHOL 135 03/16/2017 1110   TRIG 122 03/16/2017 1110   HDL 50 03/16/2017 1110   CHOLHDL 2.7 03/16/2017 1110   CHOLHDL 3.6 11/16/2013 0752   VLDL 24 11/16/2013 0752   LDLCALC 61 03/16/2017 1110    Other Studies Reviewed Today:  Echocardiogram 01/21/2018: Study Conclusions  - Left ventricle: The cavity size was normal. Wall thickness was   increased in a pattern of mild LVH. Systolic function was normal.   The estimated ejection fraction was in the range of 55% to 60%.   Diastolic function is abnormal, indeterminant grade. - Aortic valve: Valve area (VTI): 2.73 cm^2. Valve area (Vmax):   2.98 cm^2. Valve area (Vmean): 2.67 cm^2. - Technically adequate  study.  Cardiac catheterization 10/21/2017:  Prox RCA to Dist RCA lesion is 20% stenosed.  Previously placed Ost 1st Diag stent (unknown type) is widely patent.  Previously placed Prox LAD stent (unknown type) is widely patent.  LV end diastolic pressure is moderately elevated.  1. Patent LAD/diagonal stents with no evidence of obstructive coronary artery disease. 2. Iatrogenic myocardial contusion and microperforation caused by left ventricular angiography with manual injection. It appears that the catheter tip was into the myocardium. The patient had no symptoms throughout this with no arrhythmia, instability or hypotension.   Assessment and Plan:    Current medicines were reviewed with the patient today.  No orders of the defined types were placed in this encounter.   Disposition:  Signed, Satira Sark, MD, [facility] 01/28/2018 1:35 PM    [facility] [place] at [place], [place], [place] [address] Phone: [phone]; Fax: [phone]

## 2018-01-29 ENCOUNTER — Ambulatory Visit: Payer: Medicare HMO | Admitting: Cardiology

## 2018-02-10 DIAGNOSIS — R202 Paresthesia of skin: Secondary | ICD-10-CM | POA: Diagnosis not present

## 2018-02-10 DIAGNOSIS — G5603 Carpal tunnel syndrome, bilateral upper limbs: Secondary | ICD-10-CM | POA: Diagnosis not present

## 2018-02-10 DIAGNOSIS — M5412 Radiculopathy, cervical region: Secondary | ICD-10-CM | POA: Diagnosis not present

## 2018-02-10 DIAGNOSIS — M545 Low back pain: Secondary | ICD-10-CM | POA: Diagnosis not present

## 2018-03-04 DIAGNOSIS — R201 Hypoesthesia of skin: Secondary | ICD-10-CM | POA: Diagnosis not present

## 2018-03-04 DIAGNOSIS — G6289 Other specified polyneuropathies: Secondary | ICD-10-CM | POA: Diagnosis not present

## 2018-03-04 DIAGNOSIS — R202 Paresthesia of skin: Secondary | ICD-10-CM | POA: Diagnosis not present

## 2018-03-04 DIAGNOSIS — M545 Low back pain: Secondary | ICD-10-CM | POA: Diagnosis not present

## 2018-03-04 DIAGNOSIS — M542 Cervicalgia: Secondary | ICD-10-CM | POA: Diagnosis not present

## 2018-03-04 DIAGNOSIS — G8929 Other chronic pain: Secondary | ICD-10-CM | POA: Diagnosis not present

## 2018-03-25 IMAGING — DX DG CHEST 2V
2 series · 2 of 2 positions shown · non-contrast
Comparison: November 20, 2015

CLINICAL DATA: Chest pain

EXAM:
CHEST - 2 VIEW

[chest lat]
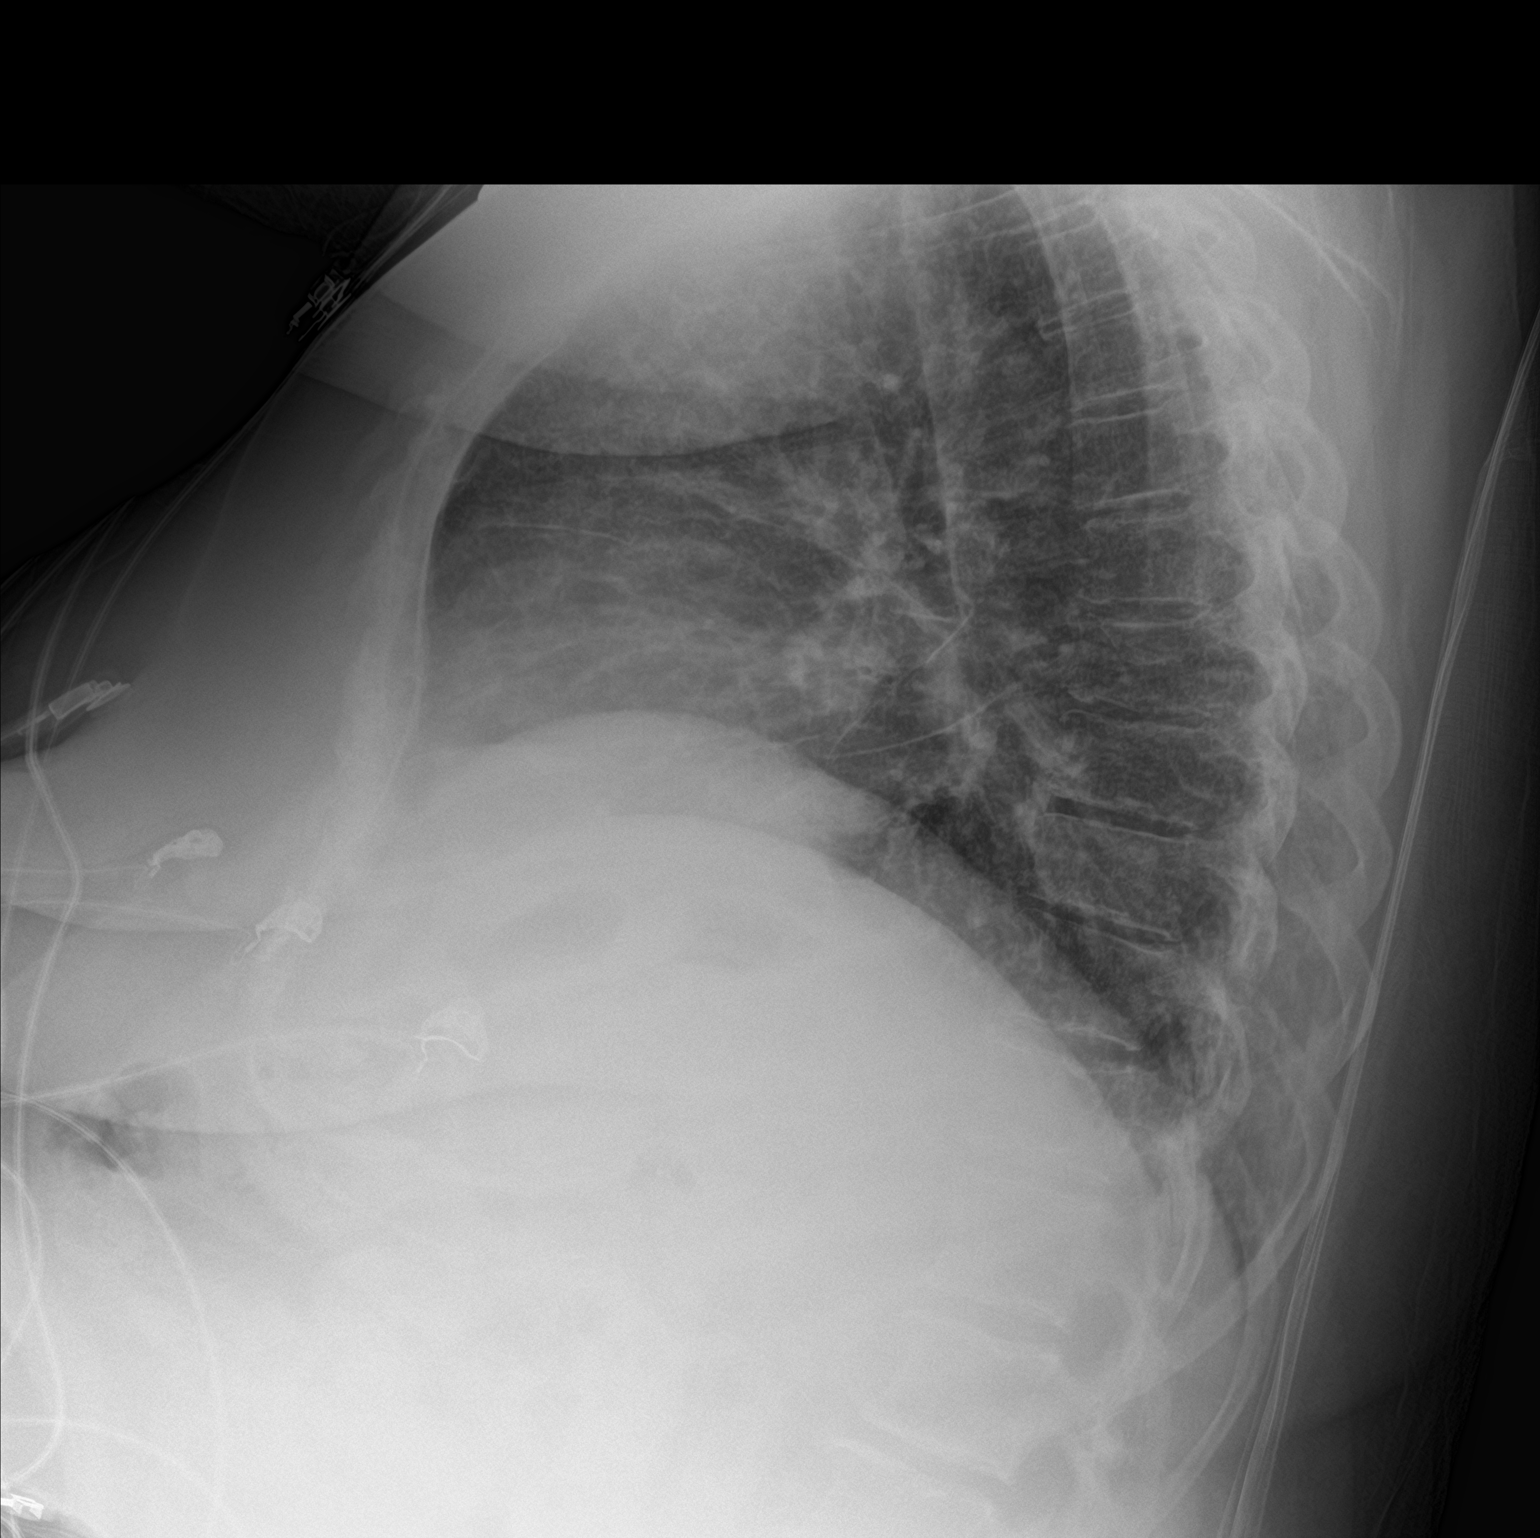

[chest ap]
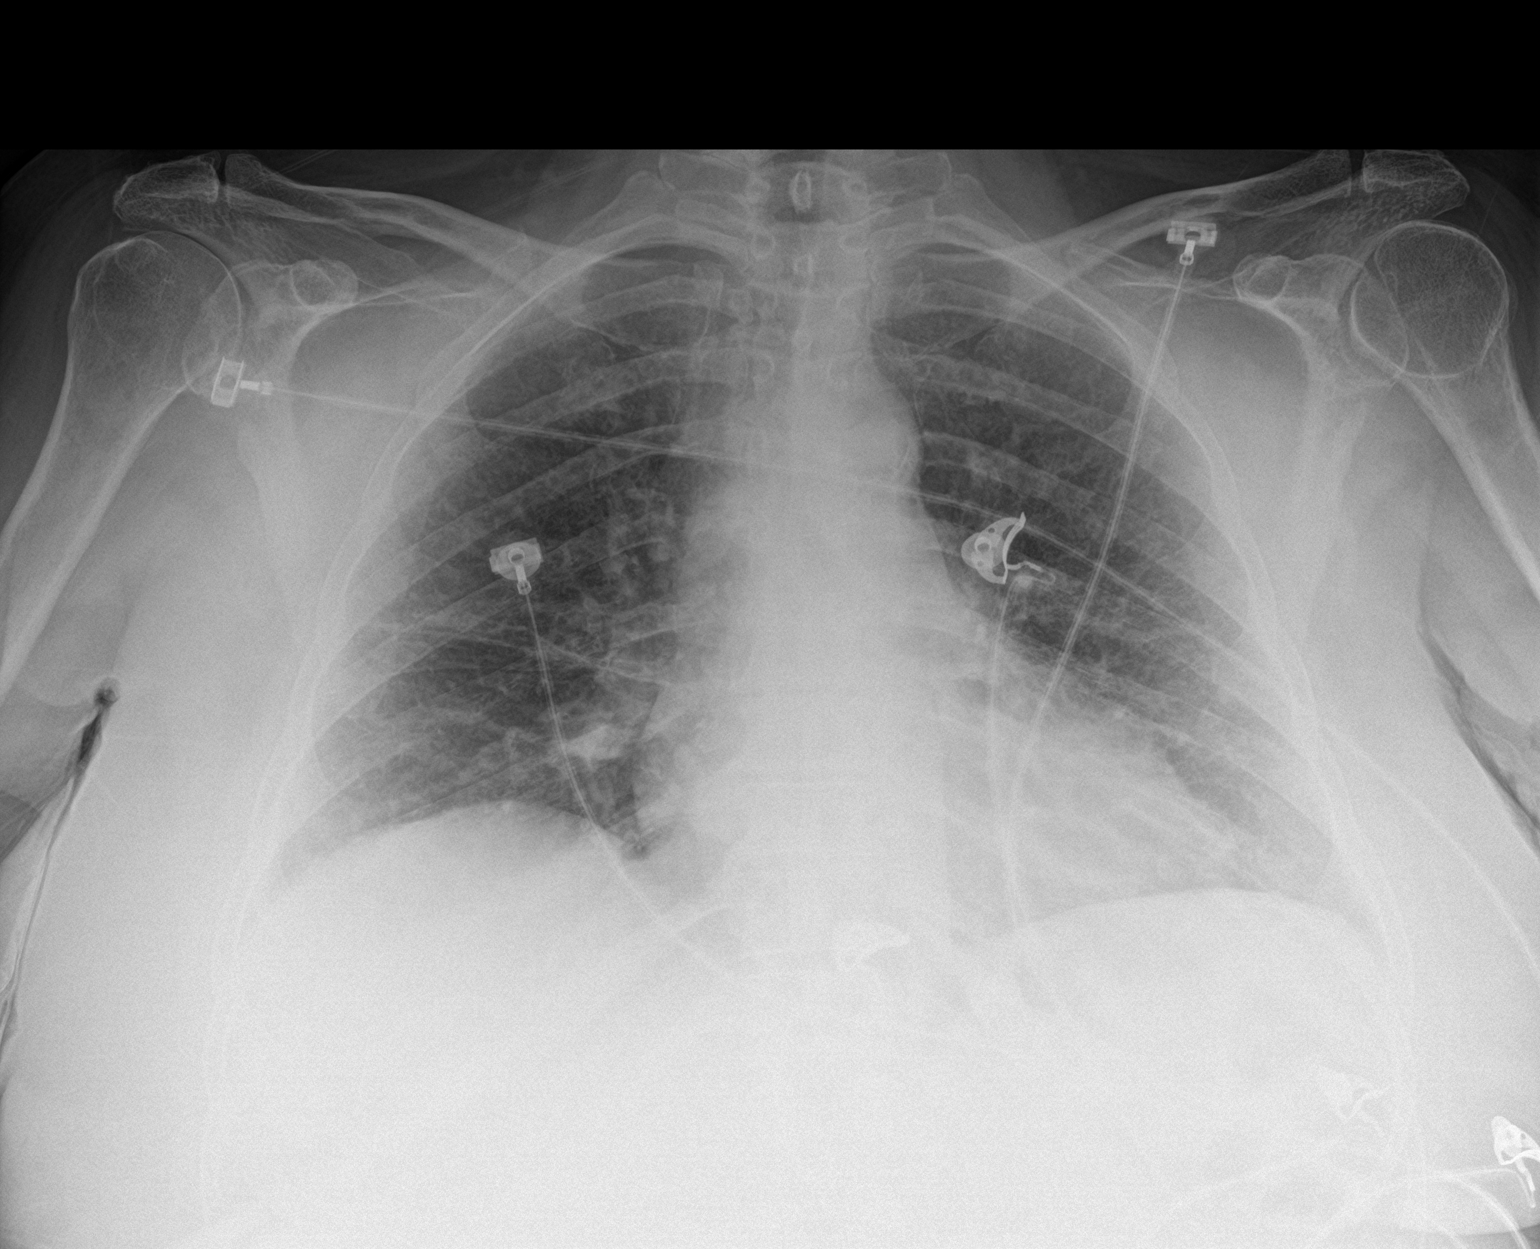

[2 of 2 positions shown; findings below may reference images not displayed]

FINDINGS: Lungs are clear. Heart size and pulmonary vascularity are normal. No
adenopathy. No pneumothorax. No bone lesions.
IMPRESSION: No edema or consolidation.

## 2018-04-07 ENCOUNTER — Ambulatory Visit (INDEPENDENT_AMBULATORY_CARE_PROVIDER_SITE_OTHER): Payer: Medicare HMO | Admitting: Family Medicine

## 2018-04-07 ENCOUNTER — Encounter: Payer: Self-pay | Admitting: Family Medicine

## 2018-04-07 VITALS — BP 118/78 | Temp 98.1°F | Ht 66.0 in | Wt 229.6 lb

## 2018-04-07 DIAGNOSIS — E782 Mixed hyperlipidemia: Secondary | ICD-10-CM

## 2018-04-07 DIAGNOSIS — I10 Essential (primary) hypertension: Secondary | ICD-10-CM | POA: Diagnosis not present

## 2018-04-07 DIAGNOSIS — Z79899 Other long term (current) drug therapy: Secondary | ICD-10-CM | POA: Diagnosis not present

## 2018-04-07 DIAGNOSIS — J019 Acute sinusitis, unspecified: Secondary | ICD-10-CM | POA: Diagnosis not present

## 2018-04-07 DIAGNOSIS — E038 Other specified hypothyroidism: Secondary | ICD-10-CM

## 2018-04-07 MED ORDER — HYDROCODONE-HOMATROPINE 5-1.5 MG/5ML PO SYRP: 5.0000 mL | ORAL_SOLUTION | Freq: Four times a day (QID) | ORAL | 0 refills | Status: AC | PRN

## 2018-04-07 MED ORDER — AZITHROMYCIN 250 MG PO TABS: ORAL_TABLET | ORAL | 0 refills | Status: DC

## 2018-04-07 MED ORDER — FLUCONAZOLE 150 MG PO TABS
150.0000 mg | ORAL_TABLET | Freq: Once | ORAL | 4 refills | Status: AC
Start: 2018-04-07 — End: 2018-04-07

## 2018-04-07 MED ORDER — CEFTRIAXONE SODIUM 500 MG IJ SOLR
500.0000 mg | Freq: Once | INTRAMUSCULAR | Status: AC
  Administered 2018-04-07: 500 mg via INTRAMUSCULAR

## 2018-04-07 NOTE — Progress Notes (Signed)
   Subjective:    Patient ID: [name], female    DOB: 02-22-1956, 62 y.o.   MRN: [id number]  Sore Throat   This is a new problem. The current episode started in the past 7 days. The maximum temperature recorded prior to her arrival was 102 - 102.9 F. Associated symptoms include congestion, coughing and headaches. Pertinent negatives include no ear pain or shortness of breath. Associated symptoms comments: Coughing up green mucus. She has tried acetaminophen (nyquil, robituusin) for the symptoms. The treatment provided no relief.   Her husband first became ill and she picked that up she had head congestion drainage sore throat and then it moved into her chest causing significant congestion coughing wheezing some shortness of breath and also some fevers   Review of Systems  Constitutional: Negative for activity change and fever.  HENT: Positive for congestion and rhinorrhea. Negative for ear pain.   Eyes: Negative for discharge.  Respiratory: Positive for cough and wheezing. Negative for shortness of breath.   Cardiovascular: Negative for chest pain.  Neurological: Positive for headaches.       Objective:   Physical Exam  Constitutional: She appears well-developed.  HENT:  Head: Normocephalic.  Nose: Nose normal.  Mouth/Throat: Oropharynx is clear and moist. No oropharyngeal exudate.  Neck: Neck supple.  Cardiovascular: Normal rate and normal heart sounds.  No murmur heard. Pulmonary/Chest: Effort normal and breath sounds normal. She has no wheezes.  Lymphadenopathy:    She has no cervical adenopathy.  Skin: Skin is warm and dry.  Nursing note and vitals reviewed.         Assessment & Plan:  Acute rhinosinusitis Acute bronchitis Because of the severity of the issue I believe there is some secondary infection I recommend Rocephin 500 IM along with antibiotic X-rays lab work not indicated currently warning signs were discussed in detail follow-up if  problems  Comprehensive lab work to be done with a follow-up office visit to discuss health issues

## 2018-04-13 ENCOUNTER — Ambulatory Visit (HOSPITAL_COMMUNITY)
Admission: RE | Admit: 2018-04-13 | Discharge: 2018-04-13 | Disposition: A | Discharge status: Home / Self Care | Payer: Medicare HMO | Source: Ambulatory Visit | Attending: Family Medicine | Admitting: Family Medicine

## 2018-04-13 ENCOUNTER — Encounter: Payer: Self-pay | Admitting: Family Medicine

## 2018-04-13 ENCOUNTER — Ambulatory Visit (INDEPENDENT_AMBULATORY_CARE_PROVIDER_SITE_OTHER): Payer: Medicare HMO | Admitting: Family Medicine

## 2018-04-13 ENCOUNTER — Other Ambulatory Visit: Payer: Self-pay | Admitting: Family Medicine

## 2018-04-13 VITALS — BP 132/80 | HR 71 | Temp 98.6°F | Ht 66.0 in | Wt 230.0 lb

## 2018-04-13 DIAGNOSIS — J019 Acute sinusitis, unspecified: Secondary | ICD-10-CM | POA: Diagnosis not present

## 2018-04-13 DIAGNOSIS — R05 Cough: Secondary | ICD-10-CM | POA: Insufficient documentation

## 2018-04-13 DIAGNOSIS — R918 Other nonspecific abnormal finding of lung field: Secondary | ICD-10-CM | POA: Diagnosis not present

## 2018-04-13 DIAGNOSIS — J22 Unspecified acute lower respiratory infection: Secondary | ICD-10-CM

## 2018-04-13 DIAGNOSIS — I700 Atherosclerosis of aorta: Secondary | ICD-10-CM | POA: Diagnosis not present

## 2018-04-13 DIAGNOSIS — R059 Cough, unspecified: Secondary | ICD-10-CM

## 2018-04-13 DIAGNOSIS — E038 Other specified hypothyroidism: Secondary | ICD-10-CM | POA: Diagnosis not present

## 2018-04-13 DIAGNOSIS — E782 Mixed hyperlipidemia: Secondary | ICD-10-CM | POA: Diagnosis not present

## 2018-04-13 DIAGNOSIS — I10 Essential (primary) hypertension: Secondary | ICD-10-CM | POA: Diagnosis not present

## 2018-04-13 MED ORDER — PREDNISONE 20 MG PO TABS: ORAL_TABLET | ORAL | 0 refills | Status: DC

## 2018-04-13 MED ORDER — AMOXICILLIN-POT CLAVULANATE 875-125 MG PO TABS: 1.0000 | ORAL_TABLET | Freq: Two times a day (BID) | ORAL | 0 refills | Status: DC

## 2018-04-13 NOTE — Progress Notes (Signed)
   Subjective:    Patient ID: [name], female    DOB: Dec 31, 1955, 62 y.o.   MRN: [id number]  Cough  This is a recurrent problem. Episode onset: 1 -2 weeks. The problem occurs constantly. Associated symptoms include rhinorrhea and wheezing. Pertinent negatives include no chest pain, ear pain, fever or shortness of breath. Associated symptoms comments: Chest congestion - green, runny nose clear. Treatments tried: zpack, shot of rocephin, could not take hycodan because it made her itch.   Pt had bloodwork today and wanted to let dr Jahvier Aldea know that she did change her own dose of atorvastatin from 80mg  to 40mg  because she thinks she is taking too much medication.   Pt states no other concerns today.   Patient significant congestion cough denies high fever or chills does relate sweats intermittently.  PMH benign Review of Systems  Constitutional: Negative for activity change and fever.  HENT: Positive for congestion and rhinorrhea. Negative for ear pain.   Eyes: Negative for discharge.  Respiratory: Positive for cough and wheezing. Negative for shortness of breath.   Cardiovascular: Negative for chest pain.       Objective:   Physical Exam  Constitutional: She appears well-developed.  HENT:  Head: Normocephalic.  Nose: Nose normal.  Mouth/Throat: Oropharynx is clear and moist. No oropharyngeal exudate.  Neck: Neck supple.  Cardiovascular: Normal rate and normal heart sounds.  No murmur heard. Pulmonary/Chest: Effort normal. She has wheezes.  Lymphadenopathy:    She has no cervical adenopathy.  Skin: Skin is warm and dry.  Nursing note and vitals reviewed.   Patient looks like she does not feel good but does not appear toxic.  15 minutes was spent with patient today discussing healthcare issues which they came.  More than 50% of this visit-total duration of visit-was spent in counseling and coordination of care.  Please see diagnosis regarding the focus of this coordination  and care       Assessment & Plan:  Reactive airway X-ray ordered Short course prednisone Antibiotics Lower respiratory infection Possible pneumonia Await the x-ray If progressive troubles or worse follow-up

## 2018-04-14 ENCOUNTER — Other Ambulatory Visit: Payer: Self-pay | Admitting: *Deleted

## 2018-04-14 LAB — HEPATIC FUNCTION PANEL
ALT: 18 IU/L (ref 0–32)
AST: 19 IU/L (ref 0–40)
Albumin: 3.9 g/dL (ref 3.6–4.8)
Alkaline Phosphatase: 124 IU/L — ABNORMAL HIGH (ref 39–117)
Bilirubin Total: 0.4 mg/dL (ref 0.0–1.2)
Bilirubin, Direct: 0.14 mg/dL (ref 0.00–0.40)
Total Protein: 6.9 g/dL (ref 6.0–8.5)

## 2018-04-14 LAB — BASIC METABOLIC PANEL
BUN/Creatinine Ratio: 9 — ABNORMAL LOW (ref 12–28)
BUN: 8 mg/dL (ref 8–27)
CO2: 27 mmol/L (ref 20–29)
Calcium: 9.9 mg/dL (ref 8.7–10.3)
Chloride: 103 mmol/L (ref 96–106)
Creatinine, Ser: 0.91 mg/dL (ref 0.57–1.00)
GFR calc Af Amer: 78 mL/min/{1.73_m2} (ref >59)
GFR calc non Af Amer: 67 mL/min/{1.73_m2} (ref >59)
Glucose: 105 mg/dL — ABNORMAL HIGH (ref 65–99)
Potassium: 4.2 mmol/L (ref 3.5–5.2)
Sodium: 143 mmol/L (ref 134–144)

## 2018-04-14 LAB — LIPID PANEL
Chol/HDL Ratio: 2.4 ratio (ref 0.0–4.4)
Cholesterol, Total: 129 mg/dL (ref 100–199)
HDL: 54 mg/dL (ref >39)
LDL Calculated: 58 mg/dL (ref 0–99)
Triglycerides: 87 mg/dL (ref 0–149)
VLDL Cholesterol Cal: 17 mg/dL (ref 5–40)

## 2018-04-14 MED ORDER — ATORVASTATIN CALCIUM 40 MG PO TABS: ORAL_TABLET | ORAL | 1 refills | Status: DC

## 2018-04-14 NOTE — Progress Notes (Signed)
Cardiology Office Note  Date: 04/15/2018   ID: [name], DOB 1955-11-02, MRN [id number]  PCP: Kathyrn Drown, MD  Primary Cardiologist: Rozann Lesches, MD   Chief Complaint  Patient presents with  . Coronary Artery Disease    History of Present Illness: [name] is a 62 y.o. female last seen in March.  She is here for a routine follow-up visit.  States that she has been doing well, no angina symptoms or nitroglycerin use.  She had a recent bout of bronchitis, otherwise has been stable.  She has not been riding her motorcycle much this summer due to the high temperatures.  Current cardiac regimen includes aspirin, Lipitor, Plavix, Toprol-XL, and as needed nitroglycerin.  She did cut back her Lipitor dose due to leg pain which has subsequently improved.  Recent lipid numbers showed LDL 58.  Follow-up echocardiogram done in June revealed normal LVEF in the range of 55 to 60%.  Past Medical History:  Diagnosis Date  . Anxiety   . Arthritis   . Asthma   . Coronary atherosclerosis of native coronary artery    a. s/p DES to LAD and D1 in 2005 b. cath in 11/2015 showing patent stents with mild disease along RCA and LCx  . Depression    Prior suicide attempt  . Essential hypertension   . Fibromyalgia   . GERD (gastroesophageal reflux disease)   . History of blood transfusion 2005  . History of kidney stones   . Hyperlipidemia   . Hypothyroidism   . Myocardial infarction ([place]) 2017  . NSTEMI (non-ST elevated myocardial infarction) ([place]) 2005  . Palpitations    PVC's have been noted  . Sleep apnea     Past Surgical History:  Procedure Laterality Date  . CARDIAC CATHETERIZATION  ~ 2009/2010  . CARDIAC CATHETERIZATION N/A 11/21/2015   Procedure: Left Heart Cath and Coronary Angiography;  Surgeon: Burnell Blanks, MD;  Location: [facility];  Service: Cardiovascular;  Laterality: N/A;  . CARDIAC CATHETERIZATION  10/21/2017  . CARPAL TUNNEL  RELEASE Bilateral 2007- 2008  . CORONARY ANGIOPLASTY WITH STENT PLACEMENT  2005  . FOOT BONE EXCISION     Sesmoid bone left foot-repair of fracture  . LEFT HEART CATH AND CORONARY ANGIOGRAPHY N/A 10/21/2017   Procedure: LEFT HEART CATH AND CORONARY ANGIOGRAPHY;  Surgeon: Wellington Hampshire, MD;  Location: [facility];  Service: Cardiovascular;  Laterality: N/A;  . TUBAL LIGATION      Current Outpatient Medications  Medication Sig Dispense Refill  . albuterol (PROVENTIL HFA;VENTOLIN HFA) 108 (90 Base) MCG/ACT inhaler Inhale 2 puffs into the lungs every 4 (four) hours as needed. 1 Inhaler 2  . aspirin EC 81 MG EC tablet Take 1 tablet (81 mg total) by mouth daily.    Marland Kitchen atorvastatin (LIPITOR) 40 MG tablet TAKE 1 TABLET ONE TIME DAILY 90 tablet 1  . clopidogrel (PLAVIX) 75 MG tablet TAKE 1 TABLET ONE TIME DAILY (Patient taking differently: TAKE 75 MG BY MOUTH ONE TIME DAILY) 90 tablet 2  . Cyanocobalamin (VITAMIN B-12 PO) Take 1 tablet by mouth daily.    Marland Kitchen FLUoxetine (PROZAC) 20 MG tablet Take 20 mg by mouth daily.    Marland Kitchen levothyroxine (SYNTHROID, LEVOTHROID) 125 MCG tablet TAKE 1 TABLET EVERY DAY 90 tablet 1  . metoprolol succinate (TOPROL-XL) 50 MG 24 hr tablet Take 1 tablet (50 mg total) by mouth daily. Take with or immediately following a meal. 90 tablet 3  .  nitroGLYCERIN (NITROSTAT) 0.4 MG SL tablet DISSOLVE 1 TABLET UNDER TONGUE EVERY 5 MINUTES UP TO 3 DOSES AS NEEDED FOR CHEST PAIN. 25 tablet 3  . pantoprazole (PROTONIX) 40 MG tablet TAKE 1 TABLET DAILY FOR ACID REFLUX. 90 tablet 1  . predniSONE (DELTASONE) 20 MG tablet 2 qd for 5 days 10 tablet 0   No current facility-administered medications for this visit.    Allergies:  Albuterol; Cymbalta [duloxetine hcl]; and Lyrica [pregabalin]   Social History: The patient  reports that she has been smoking cigarettes. She has a 16.17 pack-year smoking history. She has never used smokeless tobacco. She reports that she does not drink alcohol  or use drugs.   ROS:  Please see the history of present illness. Otherwise, complete review of systems is positive for none.  All other systems are reviewed and negative.   Physical Exam: VS:  BP 118/80   Pulse 74   Ht 5\' 6"  (1.676 m)   Wt 232 lb 12.8 oz (105.6 kg)   SpO2 95%   BMI 37.57 kg/m , BMI Body mass index is 37.57 kg/m.  Wt Readings from Last 3 Encounters:  04/15/18 232 lb 12.8 oz (105.6 kg)  04/13/18 230 lb (104.3 kg)  04/07/18 229 lb 9.6 oz (104.1 kg)    General: Patient appears comfortable at rest. HEENT: Conjunctiva and lids normal, oropharynx clear. Neck: Supple, no elevated JVP or carotid bruits, no thyromegaly. Lungs: Clear to auscultation, nonlabored breathing at rest. Cardiac: Regular rate and rhythm, no S3 or significant systolic murmur. Abdomen: Soft, nontender, bowel sounds present. Extremities: No pitting edema, distal pulses 2+. Skin: Warm and dry.  Full tattoos on both arms. Musculoskeletal: No kyphosis. Neuropsychiatric: Alert and oriented x3, affect grossly appropriate.  ECG: I personally reviewed the tracing from 10/21/2017 which showed sinus rhythm with possible old anteroseptal infarct pattern.  Recent Labwork: 10/22/2017: Hemoglobin 11.2; Platelets 291 04/13/2018: ALT 18; AST 19; BUN 8; Creatinine, Ser 0.91; Potassium 4.2; Sodium 143     Component Value Date/Time   CHOL 129 04/13/2018 0830   TRIG 87 04/13/2018 0830   HDL 54 04/13/2018 0830   CHOLHDL 2.4 04/13/2018 0830   CHOLHDL 3.6 11/16/2013 0752   VLDL 24 11/16/2013 0752   LDLCALC 58 04/13/2018 0830    Other Studies Reviewed Today:  Echocardiogram 01/21/2018: Study Conclusions  - Left ventricle: The cavity size was normal. Wall thickness was   increased in a pattern of mild LVH. Systolic function was normal.   The estimated ejection fraction was in the range of 55% to 60%.   Diastolic function is abnormal, indeterminant grade. - Aortic valve: Valve area (VTI): 2.73 cm^2. Valve area  (Vmax):   2.98 cm^2. Valve area (Vmean): 2.67 cm^2. - Technically adequate study.  Cardiac catheterization 10/21/2017:  Prox RCA to Dist RCA lesion is 20% stenosed.  Previously placed Ost 1st Diag stent (unknown type) is widely patent.  Previously placed Prox LAD stent (unknown type) is widely patent.  LV end diastolic pressure is moderately elevated.  1. Patent LAD/diagonal stents with no evidence of obstructive coronary artery disease. 2. Iatrogenic myocardial contusion and microperforation caused by left ventricular angiography with manual injection. It appears that the catheter tip was into the myocardium. The patient had no symptoms throughout this with no arrhythmia, instability or hypotension.   Assessment and Plan:  1.  CAD status post DES to the LAD and first diagonal in 2005.  Cardiac catheterization from earlier in the year demonstrated patent stent sites.  Plan is to continue long-term DAPT as discussed previously.  She does not report any active angina at this time.  2.  Mixed hyperlipidemia, continues on Lipitor although at lower dose and tolerating this well.  Recent LDL 58.  3.  Essential hypertension, blood pressure well controlled today.  4.  Iatrogenic myocardial contusion/microperforation at the time of cardiac catheterization earlier in the year.  She has had no subsequent problems with follow-up echocardiogram showing normal LVEF and no focal wall motion abnormalities.  Current medicines were reviewed with the patient today.  Disposition: Follow-up in 6 months.  Signed, Satira Sark, MD, [facility] 04/15/2018 1:59 PM    [place] at [place], [place], [place] [address] Phone: [phone]; Fax: [phone]

## 2018-04-15 ENCOUNTER — Ambulatory Visit (INDEPENDENT_AMBULATORY_CARE_PROVIDER_SITE_OTHER): Payer: Medicare HMO | Admitting: Cardiology

## 2018-04-15 ENCOUNTER — Encounter: Payer: Self-pay | Admitting: Cardiology

## 2018-04-15 VITALS — BP 118/80 | HR 74 | Ht 66.0 in | Wt 232.8 lb

## 2018-04-15 DIAGNOSIS — I10 Essential (primary) hypertension: Secondary | ICD-10-CM

## 2018-04-15 DIAGNOSIS — I25119 Atherosclerotic heart disease of native coronary artery with unspecified angina pectoris: Secondary | ICD-10-CM

## 2018-04-15 DIAGNOSIS — E782 Mixed hyperlipidemia: Secondary | ICD-10-CM

## 2018-04-15 NOTE — Patient Instructions (Signed)

## 2018-05-05 ENCOUNTER — Ambulatory Visit (HOSPITAL_COMMUNITY)
Admission: RE | Admit: 2018-05-05 | Discharge: 2018-05-05 | Disposition: A | Discharge status: Home / Self Care | Payer: Medicare HMO | Source: Ambulatory Visit | Attending: Family Medicine | Admitting: Family Medicine

## 2018-05-05 DIAGNOSIS — R05 Cough: Secondary | ICD-10-CM | POA: Diagnosis not present

## 2018-05-06 ENCOUNTER — Ambulatory Visit: Payer: Medicare HMO | Admitting: Family Medicine

## 2018-05-12 ENCOUNTER — Other Ambulatory Visit: Payer: Self-pay | Admitting: Family Medicine

## 2018-05-13 NOTE — Telephone Encounter (Signed)
Left message to return call to see if pt requested med. It is on list but does not have prescriber attached to med.

## 2018-06-10 DIAGNOSIS — M797 Fibromyalgia: Secondary | ICD-10-CM | POA: Diagnosis not present

## 2018-06-10 DIAGNOSIS — M542 Cervicalgia: Secondary | ICD-10-CM | POA: Diagnosis not present

## 2018-06-10 DIAGNOSIS — G6289 Other specified polyneuropathies: Secondary | ICD-10-CM | POA: Diagnosis not present

## 2018-06-10 DIAGNOSIS — R202 Paresthesia of skin: Secondary | ICD-10-CM | POA: Diagnosis not present

## 2018-06-10 DIAGNOSIS — M545 Low back pain: Secondary | ICD-10-CM | POA: Diagnosis not present

## 2018-06-10 DIAGNOSIS — G5603 Carpal tunnel syndrome, bilateral upper limbs: Secondary | ICD-10-CM | POA: Diagnosis not present

## 2018-06-23 ENCOUNTER — Other Ambulatory Visit: Payer: Self-pay

## 2018-06-23 MED ORDER — PANTOPRAZOLE SODIUM 40 MG PO TBEC: DELAYED_RELEASE_TABLET | ORAL | 1 refills | Status: DC

## 2018-07-09 ENCOUNTER — Other Ambulatory Visit: Payer: Self-pay | Admitting: Family Medicine

## 2018-07-12 NOTE — Telephone Encounter (Signed)
May have 2 refills on each

## 2018-07-15 ENCOUNTER — Encounter: Payer: Self-pay | Admitting: Family Medicine

## 2018-07-15 ENCOUNTER — Ambulatory Visit (INDEPENDENT_AMBULATORY_CARE_PROVIDER_SITE_OTHER): Payer: Medicare HMO | Admitting: Family Medicine

## 2018-07-15 VITALS — BP 122/82 | Ht 66.0 in | Wt 231.4 lb

## 2018-07-15 DIAGNOSIS — L299 Pruritus, unspecified: Secondary | ICD-10-CM | POA: Diagnosis not present

## 2018-07-15 MED ORDER — HYDROXYZINE HCL 50 MG PO TABS: 50.0000 mg | ORAL_TABLET | Freq: Three times a day (TID) | ORAL | 0 refills | Status: DC | PRN

## 2018-07-15 MED ORDER — PREDNISONE 20 MG PO TABS: ORAL_TABLET | ORAL | 0 refills | Status: DC

## 2018-07-15 NOTE — Progress Notes (Signed)
   Subjective:    Patient ID: Marcial Pacas, female    DOB: 1956/05/22, 62 y.o.   MRN: [id number]  HPI  Patient arrives with itching all over but no rash. Patient states she feels like it is from pinched nerves in her neck and was scheduled a new nerve conduction test. Patient states she has had this about 5 months ago.  Reports itching head to toe x 5 days with no rash, happened before about 5 months ago and went to ED and was told she had hives. She thinks it's coming from pinched nerve in her neck - has been getting spinal injections. Last injections was last month, was scheduled for a nerve conduction study in January.   Reports using ALL free and clear detergent, denies changing any soaps or lotions.   Has tried otc hydrocortisone cream, gold bond, comfrey balm and oatmeal baths with no relief.   Review of Systems  Skin: Negative for color change, rash and wound.       Objective:   Physical Exam Vitals signs and nursing note reviewed.  Constitutional:      General: She is not in acute distress.    Appearance: She is well-developed.  HENT:     Head: Normocephalic and atraumatic.  Neck:     Musculoskeletal: Neck supple.  Cardiovascular:     Rate and Rhythm: Normal rate and regular rhythm.     Heart sounds: Normal heart sounds. No murmur.  Pulmonary:     Effort: Pulmonary effort is normal. No respiratory distress.     Breath sounds: Normal breath sounds.  Skin:    General: Skin is warm and dry.     Findings: No erythema or rash.  Neurological:     Mental Status: She is alert and oriented to person, place, and time.           Assessment & Plan:  Itching  Discussed with pt do not feel this is r/t pinched nerve. No rash noted on exam. Will go ahead and treat with prednisone taper and hydroxyzine prn. Pt warned of potential for drowsiness when taking hydroxyzine and to avoid driving or operating heavy machinery when taking. She verbalized understanding. If symptoms  persist recommend referral to an allergy specialist.   Dr. Mickie Hillier was consulted on this case and is in agreement with the above treatment plan.

## 2018-08-30 ENCOUNTER — Other Ambulatory Visit: Payer: Self-pay | Admitting: Family Medicine

## 2018-09-06 ENCOUNTER — Telehealth: Payer: Self-pay | Admitting: *Deleted

## 2018-09-06 MED ORDER — LEVOTHYROXINE SODIUM 125 MCG PO TABS: 125.0000 ug | ORAL_TABLET | Freq: Every day | ORAL | 0 refills | Status: DC

## 2018-09-06 NOTE — Telephone Encounter (Signed)
Fax from [facility] requesting refills on  Levothyroxine 125 mcg one daily. Last tsh 03/16/17. Last med check up 09/08/17. Was seen in December for acute issue

## 2018-09-06 NOTE — Telephone Encounter (Signed)
Prescription sent electronically to pharmacy. 

## 2018-09-06 NOTE — Telephone Encounter (Signed)
May have 90 tablets needs follow-up office visit and lab work

## 2018-09-08 ENCOUNTER — Encounter: Payer: Self-pay | Admitting: Family Medicine

## 2018-09-08 ENCOUNTER — Ambulatory Visit (INDEPENDENT_AMBULATORY_CARE_PROVIDER_SITE_OTHER): Payer: Medicare HMO | Admitting: Family Medicine

## 2018-09-08 VITALS — BP 92/68 | Temp 97.9°F | Ht 66.0 in | Wt 233.0 lb

## 2018-09-08 DIAGNOSIS — M542 Cervicalgia: Secondary | ICD-10-CM | POA: Diagnosis not present

## 2018-09-08 DIAGNOSIS — J019 Acute sinusitis, unspecified: Secondary | ICD-10-CM

## 2018-09-08 DIAGNOSIS — J029 Acute pharyngitis, unspecified: Secondary | ICD-10-CM

## 2018-09-08 LAB — POCT RAPID STREP A (OFFICE): Rapid Strep A Screen: NEGATIVE

## 2018-09-08 MED ORDER — AMOXICILLIN 500 MG PO TABS: 500.0000 mg | ORAL_TABLET | Freq: Three times a day (TID) | ORAL | 0 refills | Status: DC

## 2018-09-08 NOTE — Progress Notes (Signed)
   Subjective:    Patient ID: [name], female    DOB: 1956/05/06, 63 y.o.   MRN: [id number]  HPI  Patient is here today with complaints of a sore throat, and minor chest congestion on going for the last three days. She has been gargling with salt water. Significant head congestion drainage cough not feeling good  Also relates intermittent cervical pain no radiation down the arm but does have radiation into the shoulder and upper currently a specialist is working with her but she is very frustrated because she feels like she is in severe pain all the time  Results for orders placed or performed in visit on 09/08/18  POCT rapid strep A  Result Value Ref Range   Rapid Strep A Screen Negative Negative    Review of Systems     Objective:   Physical Exam        Assessment & Plan:  No strep throat Acute rhinosinusitis Patient was seen today for upper respiratory illness. It is felt that the patient is dealing with sinusitis.  Antibiotics were prescribed today. Importance of compliance with medication was discussed.  Symptoms should gradually resolve over the course of the next several days. If high fevers, progressive illness, difficulty breathing, worsening condition or failure for symptoms to improve over the next several days then the patient is to follow-up.  If any emergent conditions the patient is to follow-up in the emergency department otherwise to follow-up in the office.  Also recommend patient consider referral to pain management if her specialist is not able to get her neck pain under control she will see them tomorrow they will be doing some evaluation and examination

## 2018-09-09 DIAGNOSIS — G5603 Carpal tunnel syndrome, bilateral upper limbs: Secondary | ICD-10-CM | POA: Diagnosis not present

## 2018-09-09 DIAGNOSIS — G6289 Other specified polyneuropathies: Secondary | ICD-10-CM | POA: Diagnosis not present

## 2018-09-09 DIAGNOSIS — M5417 Radiculopathy, lumbosacral region: Secondary | ICD-10-CM | POA: Diagnosis not present

## 2018-09-09 DIAGNOSIS — R202 Paresthesia of skin: Secondary | ICD-10-CM | POA: Diagnosis not present

## 2018-09-09 DIAGNOSIS — M5412 Radiculopathy, cervical region: Secondary | ICD-10-CM | POA: Diagnosis not present

## 2018-09-09 DIAGNOSIS — R201 Hypoesthesia of skin: Secondary | ICD-10-CM | POA: Diagnosis not present

## 2018-09-09 DIAGNOSIS — M797 Fibromyalgia: Secondary | ICD-10-CM | POA: Diagnosis not present

## 2018-09-09 LAB — STREP A DNA PROBE: Strep Gp A Direct, DNA Probe: NEGATIVE

## 2018-09-13 ENCOUNTER — Other Ambulatory Visit: Payer: Self-pay | Admitting: Family Medicine

## 2018-10-14 DIAGNOSIS — M542 Cervicalgia: Secondary | ICD-10-CM | POA: Diagnosis not present

## 2018-10-14 DIAGNOSIS — M797 Fibromyalgia: Secondary | ICD-10-CM | POA: Diagnosis not present

## 2018-10-14 DIAGNOSIS — R202 Paresthesia of skin: Secondary | ICD-10-CM | POA: Diagnosis not present

## 2018-10-14 DIAGNOSIS — G6289 Other specified polyneuropathies: Secondary | ICD-10-CM | POA: Diagnosis not present

## 2018-10-14 DIAGNOSIS — M545 Low back pain: Secondary | ICD-10-CM | POA: Diagnosis not present

## 2018-11-08 ENCOUNTER — Other Ambulatory Visit: Payer: Self-pay | Admitting: Family Medicine

## 2018-11-09 NOTE — Telephone Encounter (Signed)
30 days and virtual visit

## 2018-11-12 ENCOUNTER — Other Ambulatory Visit: Payer: Self-pay | Admitting: Family Medicine

## 2018-11-15 ENCOUNTER — Other Ambulatory Visit: Payer: Self-pay | Admitting: Cardiology

## 2018-11-15 NOTE — Telephone Encounter (Signed)
May have 90-day need to schedule virtual visit within the next couple weeks

## 2018-11-15 NOTE — Telephone Encounter (Signed)
Left message to return call 

## 2018-11-17 NOTE — Telephone Encounter (Signed)
Has appt 4/29 with dr Nicki Reaper

## 2018-11-30 ENCOUNTER — Other Ambulatory Visit: Payer: Self-pay | Admitting: Family Medicine

## 2018-12-01 ENCOUNTER — Other Ambulatory Visit: Payer: Self-pay

## 2018-12-01 ENCOUNTER — Encounter: Payer: Self-pay | Admitting: Family Medicine

## 2018-12-01 ENCOUNTER — Ambulatory Visit (INDEPENDENT_AMBULATORY_CARE_PROVIDER_SITE_OTHER): Payer: Medicare HMO | Admitting: Family Medicine

## 2018-12-01 DIAGNOSIS — M542 Cervicalgia: Secondary | ICD-10-CM | POA: Diagnosis not present

## 2018-12-01 DIAGNOSIS — K219 Gastro-esophageal reflux disease without esophagitis: Secondary | ICD-10-CM

## 2018-12-01 DIAGNOSIS — E782 Mixed hyperlipidemia: Secondary | ICD-10-CM | POA: Diagnosis not present

## 2018-12-01 DIAGNOSIS — I10 Essential (primary) hypertension: Secondary | ICD-10-CM | POA: Diagnosis not present

## 2018-12-01 DIAGNOSIS — E038 Other specified hypothyroidism: Secondary | ICD-10-CM

## 2018-12-01 MED ORDER — FLUOXETINE HCL 20 MG PO CAPS: 40.0000 mg | ORAL_CAPSULE | Freq: Every day | ORAL | 2 refills | Status: DC

## 2018-12-01 MED ORDER — CLOPIDOGREL BISULFATE 75 MG PO TABS: ORAL_TABLET | ORAL | 1 refills | Status: DC

## 2018-12-01 MED ORDER — LEVOTHYROXINE SODIUM 125 MCG PO TABS: 125.0000 ug | ORAL_TABLET | Freq: Every day | ORAL | 1 refills | Status: DC

## 2018-12-01 MED ORDER — ISOSORBIDE MONONITRATE ER 30 MG PO TB24: ORAL_TABLET | ORAL | 1 refills | Status: DC

## 2018-12-01 MED ORDER — PANTOPRAZOLE SODIUM 40 MG PO TBEC: DELAYED_RELEASE_TABLET | ORAL | 1 refills | Status: DC

## 2018-12-01 MED ORDER — ATORVASTATIN CALCIUM 40 MG PO TABS: ORAL_TABLET | ORAL | 1 refills | Status: DC

## 2018-12-01 NOTE — Progress Notes (Signed)
Subjective:    Patient ID: [name], female    DOB: 1956/03/08, 63 y.o.   MRN: [id number] Format-video  Patient present at home Provider present at office Consent for interaction obtained Coronavirus outbreak made virtual visit necessary    Hyperlipidemia  This is a chronic problem. The current episode started more than 1 year ago. Treatments tried: lipitor 40 mg. There are no compliance problems.  Risk factors for coronary artery disease include dyslipidemia and post-menopausal.   BP 122/73 Patient has chronic neck pain she sees a specialist they are doing injections he also doing hydrocodone it seems to help some she is thinking about possibly seeing a different pain medicine specialist but for now she will stick with where she is going  Her reflux is under good control she takes her medicine regular basis Patient does have ongoing trouble with reflux.  Takes medication on a regular basis.  Tries to minimize foods as best they can.  They understand the importance of dietary compliance.  May also try to avoid eating a large meal close to bedtime.  Patient denies any dysphagia denies hematochezia.  States medicine does a good job keeping the problem under good control.  Without the medication may certainly have issues.They desire to continue taking their medication.  Patient has thyroid condition.  Takes thyroid medication on a regular basis.  States that the proper way.  Relates compliance.  States no negative side effects.  States condition seems to be under good control.  Patient here for follow-up regarding cholesterol.  The patient does have hyperlipidemia.  Patient does try to maintain a reasonable diet.  Patient does take the medication on a regular basis.  Denies missing a dose.  The patient denies any obvious side effects.  Prior blood work results reviewed with the patient.  The patient is aware of his cholesterol goals and the need to keep it under good control to lessen  the risk of disease.  The patient's BMI is calculated.  The patient does have obesity.  The patient does try to some degree staying active and watching diet.  It is in the vital signs and acknowledged.  It is above the recommended BMI for the patient's height and weight.  The patient has been counseled regarding healthy diet, restricted portions, avoiding excessive carbohydrates/sugary foods, and increase physical activity as health permits.  It is in the patient's best interest to lower the risk of secondary illness including heart disease strokes and cancer by losing weight.  The patient acknowledges this information.  Virtual Visit via Video Note  I connected with [name] on 12/01/18 at 10:30 AM EDT by a video enabled telemedicine application and verified that I am speaking with the correct person using two identifiers.   I discussed the limitations of evaluation and management by telemedicine and the availability of in person appointments. The patient expressed understanding and agreed to proceed.  History of Present Illness:    Observations/Objective:   Assessment and Plan:   Follow Up Instructions:    I discussed the assessment and treatment plan with the patient. The patient was provided an opportunity to ask questions and all were answered. The patient agreed with the plan and demonstrated an understanding of the instructions.   The patient was advised to call back or seek an in-person evaluation if the symptoms worsen or if the condition fails to improve as anticipated.  I provided 25 minutes of non-face-to-face time during this encounter.  Review of Systems     Objective:   Physical Exam   25 minutes was spent with the patient.  This statement verifies that 25 minutes was indeed spent with the patient.  More than 50% of this visit-total duration of the visit-was spent in counseling and coordination of care. The issues that the patient came in for today  as reflected in the diagnosis (s) please refer to documentation for further details.      Assessment & Plan:  Cervical pain follow through with specialist they are prescribing her hydrocodone  Blood pressure under good control continue current heart medicines  Reflux no dysphasia continue current medications  Hypothyroidism check TSH continue medication  Hyperlipidemia continue medication check lab work previous labs reviewed new labs ordered she will do her lab work later in the summer with follow-up early fall  Morbid obesity do the best she can try to watch diet stay physically active try to lose weight  Patient was congratulated she quit smoking

## 2018-12-10 ENCOUNTER — Telehealth: Payer: Self-pay | Admitting: Family Medicine

## 2018-12-10 NOTE — Telephone Encounter (Signed)
Discussed with pt. Pt verbalized understanding.  °

## 2018-12-10 NOTE — Telephone Encounter (Signed)
Recommendation for something she can use for hemorrhoids?  She said it's from using cheap toilet paper.  (Had e-visit last week for unrelated)  [facility] in [place]

## 2018-12-10 NOTE — Telephone Encounter (Signed)
So several different things Very important to get enough fiber and water in the diet keep bowel movements soft Also warm soaks in the bathtub for similar as needed 10 to 15 minutes at a time 2 or 3 times a day Otherwise warm compresses to the hemorrhoid Also may use topicals such as Preparation H applying multiple times per day Often hemorrhoids can take a good 5 to 10 days to improve But if they get worse sometimes need referral to general surgery or gastroenterology for further management let us know if ongoing troubles

## 2019-01-06 DIAGNOSIS — M545 Low back pain: Secondary | ICD-10-CM | POA: Diagnosis not present

## 2019-01-06 DIAGNOSIS — Z79899 Other long term (current) drug therapy: Secondary | ICD-10-CM | POA: Diagnosis not present

## 2019-01-06 DIAGNOSIS — M542 Cervicalgia: Secondary | ICD-10-CM | POA: Diagnosis not present

## 2019-01-06 DIAGNOSIS — R201 Hypoesthesia of skin: Secondary | ICD-10-CM | POA: Diagnosis not present

## 2019-01-06 DIAGNOSIS — M797 Fibromyalgia: Secondary | ICD-10-CM | POA: Diagnosis not present

## 2019-01-06 DIAGNOSIS — G6289 Other specified polyneuropathies: Secondary | ICD-10-CM | POA: Diagnosis not present

## 2019-01-14 ENCOUNTER — Telehealth: Payer: Self-pay | Admitting: Cardiology

## 2019-01-14 NOTE — Telephone Encounter (Signed)
Virtual Visit Pre-Appointment Phone Call  "(Name), I am calling you today to discuss your upcoming appointment. We are currently trying to limit exposure to the virus that causes COVID-19 by seeing patients at home rather than in the office."  1. "What is the BEST phone number to call the day of the visit?" - include this in appointment notes  2. Do you have or have access to (through a family member/friend) a smartphone with video capability that we can use for your visit?" a. If yes - list this number in appt notes as cell (if different from BEST phone #) and list the appointment type as a VIDEO visit in appointment notes b. If no - list the appointment type as a PHONE visit in appointment notes  3. Confirm consent - "In the setting of the current Covid19 crisis, you are scheduled for a (phone or video) visit with your provider on (date) at (time).  Just as we do with many in-office visits, in order for you to participate in this visit, we must obtain consent.  If you'd like, I can send this to your mychart (if signed up) or email for you to review.  Otherwise, I can obtain your verbal consent now.  All virtual visits are billed to your insurance company just like a normal visit would be.  By agreeing to a virtual visit, we'd like you to understand that the technology does not allow for your provider to perform an examination, and thus may limit your provider's ability to fully assess your condition. If your provider identifies any concerns that need to be evaluated in person, we will make arrangements to do so.  Finally, though the technology is pretty good, we cannot assure that it will always work on either your or our end, and in the setting of a video visit, we may have to convert it to a phone-only visit.  In either situation, we cannot ensure that we have a secure connection.  Are you willing to proceed?" STAFF: Did the patient verbally acknowledge consent to telehealth visit? Document  YES/NO here: yes  4. Advise patient to be prepared - "Two hours prior to your appointment, go ahead and check your blood pressure, pulse, oxygen saturation, and your weight (if you have the equipment to check those) and write them all down. When your visit starts, your provider will ask you for this information. If you have an Apple Watch or Kardia device, please plan to have heart rate information ready on the day of your appointment. Please have a pen and paper handy nearby the day of the visit as well."  5. Give patient instructions for MyChart download to smartphone OR Doximity/Doxy.me as below if video visit (depending on what platform provider is using)  6. Inform patient they will receive a phone call 15 minutes prior to their appointment time (may be from unknown caller ID) so they should be prepared to answer    TELEPHONE CALL NOTE  [name] has been deemed a candidate for a follow-up tele-health visit to limit community exposure during the Covid-19 pandemic. I spoke with the patient via phone to ensure availability of phone/video source, confirm preferred email & phone number, and discuss instructions and expectations.  I reminded [name] to be prepared with any vital sign and/or heart rhythm information that could potentially be obtained via home monitoring, at the time of her visit. I reminded [name] to expect a phone call prior to  her visit.  Weston Anna 01/14/2019 2:38 PM   INSTRUCTIONS FOR DOWNLOADING THE MYCHART APP TO SMARTPHONE  - The patient must first make sure to have activated MyChart and know their login information - If Apple, go to [employer] and type in MyChart in the search bar and download the app. If Android, ask patient to go to [employer] and type in [facility] in the search bar and download the app. The app is free but as with any other app downloads, their phone may require them to verify saved payment information or  Apple/Android password.  - The patient will need to then log into the app with their MyChart username and password, and select [facility] as their healthcare provider to link the account. When it is time for your visit, go to the MyChart app, find appointments, and click Begin Video Visit. Be sure to Select Allow for your device to access the Microphone and Camera for your visit. You will then be connected, and your provider will be with you shortly.  **If they have any issues connecting, or need assistance please contact MyChart service desk [phone])**  **If using a computer, in order to ensure the best quality for their visit they will need to use either of the following Internet Browsers: [employer], or Google Chrome**  IF USING DOXIMITY or DOXY.ME - The patient will receive a link just prior to their visit by text.     FULL LENGTH CONSENT FOR TELE-HEALTH VISIT   I hereby voluntarily request, consent and authorize [facility] and its employed or contracted physicians, physician assistants, nurse practitioners or other licensed health care professionals (the Practitioner), to provide me with telemedicine health care services (the Services") as deemed necessary by the treating Practitioner. I acknowledge and consent to receive the Services by the Practitioner via telemedicine. I understand that the telemedicine visit will involve communicating with the Practitioner through live audiovisual communication technology and the disclosure of certain medical information by electronic transmission. I acknowledge that I have been given the opportunity to request an in-person assessment or other available alternative prior to the telemedicine visit and am voluntarily participating in the telemedicine visit.  I understand that I have the right to withhold or withdraw my consent to the use of telemedicine in the course of my care at any time, without affecting my right to future care  or treatment, and that the Practitioner or I may terminate the telemedicine visit at any time. I understand that I have the right to inspect all information obtained and/or recorded in the course of the telemedicine visit and may receive copies of available information for a reasonable fee.  I understand that some of the potential risks of receiving the Services via telemedicine include:   Delay or interruption in medical evaluation due to technological equipment failure or disruption;  Information transmitted may not be sufficient (e.g. poor resolution of images) to allow for appropriate medical decision making by the Practitioner; and/or   In rare instances, security protocols could fail, causing a breach of personal health information.  Furthermore, I acknowledge that it is my responsibility to provide information about my medical history, conditions and care that is complete and accurate to the best of my ability. I acknowledge that Practitioner's advice, recommendations, and/or decision may be based on factors not within their control, such as incomplete or inaccurate data provided by me or distortions of diagnostic images or specimens that may result from electronic transmissions. I  understand that the practice of medicine is not an exact science and that Practitioner makes no warranties or guarantees regarding treatment outcomes. I acknowledge that I will receive a copy of this consent concurrently upon execution via email to the email address I last provided but may also request a printed copy by calling the office of [facility].    I understand that my insurance will be billed for this visit.   I have read or had this consent read to me.  I understand the contents of this consent, which adequately explains the benefits and risks of the Services being provided via telemedicine.   I have been provided ample opportunity to ask questions regarding this consent and the Services and have had  my questions answered to my satisfaction.  I give my informed consent for the services to be provided through the use of telemedicine in my medical care  By participating in this telemedicine visit I agree to the above.

## 2019-01-18 NOTE — Progress Notes (Deleted)
{Choose 1 Note Type (Telehealth Visit or Telephone Visit):[phone]}   Date:  01/18/2019   ID:  Marcial Pacas, DOB 01/20/1956, MRN [id number]  {Patient Location:[phone]::"Home"} {Provider Location:[id number]::"Home"}  PCP:  Kathyrn Drown, MD  Cardiologist:  Rozann Lesches, MD Electrophysiologist:  None   Evaluation Performed:  {Choose Visit [phone]::"Follow-Up Visit"}  Chief Complaint:  ***  History of Present Illness:    AKEIRA LAHM is a 63 y.o. female last seen in September 2019.  The patient {does/does not:200015} have symptoms concerning for COVID-19 infection (fever, chills, cough, or new shortness of breath).    Past Medical History:  Diagnosis Date  . Anxiety   . Arthritis   . Asthma   . Coronary atherosclerosis of native coronary artery    a. s/p DES to LAD and D1 in 2005 b. cath in 11/2015 showing patent stents with mild disease along RCA and LCx  . Depression    Prior suicide attempt  . Essential hypertension   . Fibromyalgia   . GERD (gastroesophageal reflux disease)   . History of blood transfusion 2005  . History of kidney stones   . Hyperlipidemia   . Hypothyroidism   . Myocardial infarction ([facility]) 2017  . NSTEMI (non-ST elevated myocardial infarction) ([facility]) 2005  . Palpitations    PVC's have been noted  . Sleep apnea    Past Surgical History:  Procedure Laterality Date  . CARDIAC CATHETERIZATION  ~ 2009/2010  . CARDIAC CATHETERIZATION N/A 11/21/2015   Procedure: Left Heart Cath and Coronary Angiography;  Surgeon: Burnell Blanks, MD;  Location: [facility];  Service: Cardiovascular;  Laterality: N/A;  . CARDIAC CATHETERIZATION  10/21/2017  . CARPAL TUNNEL RELEASE Bilateral 2007- 2008  . CORONARY ANGIOPLASTY WITH STENT PLACEMENT  2005  . FOOT BONE EXCISION     Sesmoid bone left foot-repair of fracture  . LEFT HEART CATH AND CORONARY ANGIOGRAPHY N/A 10/21/2017   Procedure: LEFT HEART CATH AND CORONARY ANGIOGRAPHY;   Surgeon: Wellington Hampshire, MD;  Location: [facility];  Service: Cardiovascular;  Laterality: N/A;  . TUBAL LIGATION       No outpatient medications have been marked as taking for the 01/19/19 encounter (Appointment) with Satira Sark, MD.     Allergies:   Albuterol, Cymbalta [duloxetine hcl], and Lyrica [pregabalin]   Social History   Tobacco Use  . Smoking status: Former Smoker    Packs/day: 0.33    Years: 49.00    Pack years: 16.17    Types: Cigarettes  . Smokeless tobacco: Never Used  . Tobacco comment: 1 pack per week & vapes also   Substance Use Topics  . Alcohol use: No    Alcohol/week: 0.0 standard drinks  . Drug use: No     Family Hx: The patient's family history includes Coronary artery disease in her father; Heart failure in her father and mother. There is no history of Anesthesia problems, Hypotension, Malignant hyperthermia, or Pseudochol deficiency.  ROS:   Please see the history of present illness.    *** All other systems reviewed and are negative.   Prior CV studies:   The following studies were reviewed today:  Echocardiogram 01/21/2018: Study Conclusions  - Left ventricle: The cavity size was normal. Wall thickness was increased in a pattern of mild LVH. Systolic function was normal. The estimated ejection fraction was in the range of 55% to 60%. Diastolic function is abnormal, indeterminant grade. - Aortic valve: Valve area (VTI): 2.73 cm^2. Valve  area (Vmax): 2.98 cm^2. Valve area (Vmean): 2.67 cm^2. - Technically adequate study.  Cardiac catheterization 10/21/2017:  Prox RCA to Dist RCA lesion is 20% stenosed.  Previously placed Ost 1st Diag stent (unknown type) is widely patent.  Previously placed Prox LAD stent (unknown type) is widely patent.  LV end diastolic pressure is moderately elevated.  1. Patent LAD/diagonal stents with no evidence of obstructive coronary artery disease. 2. Iatrogenic myocardial  contusion and microperforation caused by left ventricular angiography with manual injection. It appears that the catheter tip was into the myocardium. The patient had no symptoms throughout this with no arrhythmia, instability or hypotension.  Labs/Other Tests and Data Reviewed:    EKG:  An ECG dated 10/21/2017 was personally reviewed today and demonstrated:  Sinus rhythm with possible old anteroseptal infarct pattern.  Recent Labs: 04/13/2018: ALT 18; BUN 8; Creatinine, Ser 0.91; Potassium 4.2; Sodium 143   Recent Lipid Panel Lab Results  Component Value Date/Time   CHOL 129 04/13/2018 08:30 AM   TRIG 87 04/13/2018 08:30 AM   HDL 54 04/13/2018 08:30 AM   CHOLHDL 2.4 04/13/2018 08:30 AM   CHOLHDL 3.6 11/16/2013 07:52 AM   LDLCALC 58 04/13/2018 08:30 AM    Wt Readings from Last 3 Encounters:  09/08/18 233 lb (105.7 kg)  07/15/18 231 lb 6.4 oz (105 kg)  04/15/18 232 lb 12.8 oz (105.6 kg)     Objective:    Vital Signs:  There were no vitals taken for this visit.   {[facility] Virtual Exam (Optional):[phone]::"VITAL SIGNS:  reviewed"}  ASSESSMENT & PLAN:    1. ***  COVID-19 Education: The signs and symptoms of COVID-19 were discussed with the patient and how to seek care for testing (follow up with PCP or arrange E-visit).  ***The importance of social distancing was discussed today.  Time:   Today, I have spent *** minutes with the patient with telehealth technology discussing the above problems.     Medication Adjustments/Labs and Tests Ordered: Current medicines are reviewed at length with the patient today.  Concerns regarding medicines are outlined above.   Tests Ordered: No orders of the defined types were placed in this encounter.   Medication Changes: No orders of the defined types were placed in this encounter.   Follow Up:  {F/U Format:[phone]} {follow up:15908}  Signed, Rozann Lesches, MD  01/18/2019 1:23 PM    [facility]  [facility]

## 2019-01-19 ENCOUNTER — Telehealth: Payer: Medicare HMO | Admitting: Cardiology

## 2019-03-30 DIAGNOSIS — I10 Essential (primary) hypertension: Secondary | ICD-10-CM | POA: Diagnosis not present

## 2019-03-30 DIAGNOSIS — E782 Mixed hyperlipidemia: Secondary | ICD-10-CM | POA: Diagnosis not present

## 2019-03-30 DIAGNOSIS — E038 Other specified hypothyroidism: Secondary | ICD-10-CM | POA: Diagnosis not present

## 2019-03-31 LAB — BASIC METABOLIC PANEL
BUN/Creatinine Ratio: 10 — ABNORMAL LOW (ref 12–28)
BUN: 11 mg/dL (ref 8–27)
CO2: 23 mmol/L (ref 20–29)
Calcium: 9.6 mg/dL (ref 8.7–10.3)
Chloride: 99 mmol/L (ref 96–106)
Creatinine, Ser: 1.14 mg/dL — ABNORMAL HIGH (ref 0.57–1.00)
GFR calc Af Amer: 59 mL/min/{1.73_m2} — ABNORMAL LOW (ref >59)
GFR calc non Af Amer: 51 mL/min/{1.73_m2} — ABNORMAL LOW (ref >59)
Glucose: 113 mg/dL — ABNORMAL HIGH (ref 65–99)
Potassium: 4.1 mmol/L (ref 3.5–5.2)
Sodium: 139 mmol/L (ref 134–144)

## 2019-03-31 LAB — HEPATIC FUNCTION PANEL
ALT: 11 IU/L (ref 0–32)
AST: 21 IU/L (ref 0–40)
Albumin: 3.8 g/dL (ref 3.8–4.8)
Alkaline Phosphatase: 121 IU/L — ABNORMAL HIGH (ref 39–117)
Bilirubin Total: 0.4 mg/dL (ref 0.0–1.2)
Bilirubin, Direct: 0.13 mg/dL (ref 0.00–0.40)
Total Protein: 7 g/dL (ref 6.0–8.5)

## 2019-03-31 LAB — LIPID PANEL
Chol/HDL Ratio: 2.7 ratio (ref 0.0–4.4)
Cholesterol, Total: 135 mg/dL (ref 100–199)
HDL: 50 mg/dL (ref >39)
LDL Calculated: 60 mg/dL (ref 0–99)
Triglycerides: 126 mg/dL (ref 0–149)
VLDL Cholesterol Cal: 25 mg/dL (ref 5–40)

## 2019-03-31 LAB — TSH: TSH: 0.358 u[IU]/mL — ABNORMAL LOW (ref 0.450–4.500)

## 2019-04-01 ENCOUNTER — Encounter: Payer: Self-pay | Admitting: Cardiology

## 2019-04-01 ENCOUNTER — Telehealth (INDEPENDENT_AMBULATORY_CARE_PROVIDER_SITE_OTHER): Payer: Medicare HMO | Admitting: Cardiology

## 2019-04-01 DIAGNOSIS — I2510 Atherosclerotic heart disease of native coronary artery without angina pectoris: Secondary | ICD-10-CM | POA: Diagnosis not present

## 2019-04-01 DIAGNOSIS — E782 Mixed hyperlipidemia: Secondary | ICD-10-CM

## 2019-04-01 DIAGNOSIS — I10 Essential (primary) hypertension: Secondary | ICD-10-CM

## 2019-04-01 MED ORDER — LEVOTHYROXINE SODIUM 125 MCG PO TABS: ORAL_TABLET | ORAL | 1 refills | Status: DC

## 2019-04-01 NOTE — Patient Instructions (Addendum)

## 2019-04-01 NOTE — Addendum Note (Signed)
Addended by: Dairl Ponder on: 04/01/2019 10:48 AM   Modules accepted: Orders

## 2019-04-01 NOTE — Addendum Note (Signed)
Addended by: Dairl Ponder on: 04/01/2019 10:53 AM   Modules accepted: Orders

## 2019-04-01 NOTE — Progress Notes (Signed)
Virtual Visit via Telephone Note   This visit type was conducted due to national recommendations for restrictions regarding the COVID-19 Pandemic (e.g. social distancing) in an effort to limit this patient's exposure and mitigate transmission in our community.  Due to her co-morbid illnesses, this patient is at least at moderate risk for complications without adequate follow up.  This format is felt to be most appropriate for this patient at this time.  The patient did not have access to video technology/had technical difficulties with video requiring transitioning to audio format only (telephone).  All issues noted in this document were discussed and addressed.  No physical exam could be performed with this format.  Please refer to the patient's chart for her  consent to telehealth for [facility].   Date:  04/01/2019   ID:  [name], [name], [name]  Patient Location: Home Provider Location: Office  PCP:  Kathyrn Drown, MD  Cardiologist:  Rozann Lesches, MD Electrophysiologist:  None   Evaluation Performed:  Follow-Up Visit  Chief Complaint:   Cardiac follow-up  History of Present Illness:    [name] is a 63 y.o. female last seen in September 2019.  We spoke by phone today.  She reports only a few episodes of angina since last encounter, no progressive pattern, no worsening shortness of breath with typical activities.  We went over her medications which are outlined below.  She reports compliance.  No intolerances to Lipitor.  I reviewed her recent lab work as outlined below.  LDL was 60.  The patient does not have symptoms concerning for COVID-19 infection (fever, chills, cough, or new shortness of breath).    Past Medical History:  Diagnosis Date  . Anxiety   . Arthritis   . Asthma   . Coronary atherosclerosis of native coronary artery    a. s/p DES to LAD and D1 in 2005 b. cath in 11/2015 showing patent stents with mild disease along  RCA and LCx  . Depression    Prior suicide attempt  . Essential hypertension   . Fibromyalgia   . GERD (gastroesophageal reflux disease)   . History of blood transfusion 2005  . History of kidney stones   . Hyperlipidemia   . Hypothyroidism   . Myocardial infarction ([place]) 2017  . NSTEMI (non-ST elevated myocardial infarction) ([place]) 2005  . Palpitations    PVC's have been noted  . Sleep apnea    Past Surgical History:  Procedure Laterality Date  . CARDIAC CATHETERIZATION  ~ 2009/2010  . CARDIAC CATHETERIZATION N/A 11/21/2015   Procedure: Left Heart Cath and Coronary Angiography;  Surgeon: Burnell Blanks, MD;  Location: [place] [facility];  Service: Cardiovascular;  Laterality: N/A;  . CARDIAC CATHETERIZATION  10/21/2017  . CARPAL TUNNEL RELEASE Bilateral 2007- 2008  . CORONARY ANGIOPLASTY WITH STENT PLACEMENT  2005  . FOOT BONE EXCISION     Sesmoid bone left foot-repair of fracture  . LEFT HEART CATH AND CORONARY ANGIOGRAPHY N/A 10/21/2017   Procedure: LEFT HEART CATH AND CORONARY ANGIOGRAPHY;  Surgeon: Wellington Hampshire, MD;  Location: [place] [facility];  Service: Cardiovascular;  Laterality: N/A;  . TUBAL LIGATION       Current Meds  Medication Sig  . amitriptyline (ELAVIL) 25 MG tablet Take 1 tablet by mouth at bedtime.  Marland Kitchen aspirin EC 81 MG EC tablet Take 1 tablet (81 mg total) by mouth daily.  Marland Kitchen atorvastatin (LIPITOR) 40 MG tablet TAKE 1  TABLET EVERY DAY  STOP  80MG   . clopidogrel (PLAVIX) 75 MG tablet TAKE 1 TABLET EVERY DAY  . Cyanocobalamin (VITAMIN B-12 PO) Take 1 tablet by mouth daily.  Marland Kitchen FLUoxetine (PROZAC) 20 MG capsule Take 2 capsules (40 mg total) by mouth daily.  Marland Kitchen HYDROcodone-acetaminophen (NORCO/VICODIN) 5-325 MG tablet Take 1 tablet by mouth every 6 (six) hours as needed for moderate pain.  . isosorbide mononitrate (IMDUR) 30 MG 24 hr tablet TAKE 1/2 TABLET ONE TIME DAILY  . levothyroxine (SYNTHROID) 125 MCG tablet 1/2 tablet on Sundays and one tablet  all other days  . lidocaine (LIDODERM) 5 % Place 1 patch onto the skin daily as needed.  . metoprolol succinate (TOPROL-XL) 50 MG 24 hr tablet TAKE 1 TABLET (50 MG TOTAL) BY MOUTH DAILY. TAKE WITH OR IMMEDIATELY FOLLOWING A MEAL.  . nitroGLYCERIN (NITROSTAT) 0.4 MG SL tablet DISSOLVE 1 TABLET UNDER TONGUE EVERY 5 MINUTES UP TO 3 DOSES AS NEEDED FOR CHEST PAIN.  Marland Kitchen pantoprazole (PROTONIX) 40 MG tablet 1 qd  . tiZANidine (ZANAFLEX) 4 MG tablet Take 1-2 tablets by mouth daily.     Allergies:   Albuterol, Cymbalta [duloxetine hcl], and Lyrica [pregabalin]   Social History   Tobacco Use  . Smoking status: Current Every Day Smoker    Packs/day: 0.33    Years: 49.00    Pack years: 16.17    Types: Cigarettes    Start date: 05/31/1969  . Smokeless tobacco: Never Used  . Tobacco comment: 1 pack per week & vapes also   Substance Use Topics  . Alcohol use: No    Alcohol/week: 0.0 standard drinks  . Drug use: No     Family Hx: The patient's family history includes Coronary artery disease in her father; Heart failure in her father and mother. There is no history of Anesthesia problems, Hypotension, Malignant hyperthermia, or Pseudochol deficiency.  ROS:   Please see the history of present illness.    Chronic pain. All other systems reviewed and are negative.   Prior CV studies:   The following studies were reviewed today:  Echocardiogram 01/21/2018: Study Conclusions  - Left ventricle: The cavity size was normal. Wall thickness was increased in a pattern of mild LVH. Systolic function was normal. The estimated ejection fraction was in the range of 55% to 60%. Diastolic function is abnormal, indeterminant grade. - Aortic valve: Valve area (VTI): 2.73 cm^2. Valve area (Vmax): 2.98 cm^2. Valve area (Vmean): 2.67 cm^2. - Technically adequate study.  Cardiac catheterization 10/21/2017:  Prox RCA to Dist RCA lesion is 20% stenosed.  Previously placed Ost 1st Diag stent  (unknown type) is widely patent.  Previously placed Prox LAD stent (unknown type) is widely patent.  LV end diastolic pressure is moderately elevated.  1. Patent LAD/diagonal stents with no evidence of obstructive coronary artery disease. 2. Iatrogenic myocardial contusion and microperforation caused by left ventricular angiography with manual injection. It appears that the catheter tip was into the myocardium. The patiet had no symptoms throughout this with no arrhythmia, instability or hypotension.  Labs/Other Tests and Data Reviewed:    EKG:  An ECG dated 10/21/2017 was personally reviewed today and demonstrated:  Sinus bradycardia with poor R wave progression.  Recent Labs: 03/30/2019: ALT 11; BUN 11; Creatinine, Ser 1.14; Potassium 4.1; Sodium 139; TSH 0.358   Recent Lipid Panel Lab Results  Component Value Date/Time   CHOL 135 03/30/2019 09:02 AM   TRIG 126 03/30/2019 09:02 AM   HDL 50 03/30/2019 09:02  AM   CHOLHDL 2.7 03/30/2019 09:02 AM   CHOLHDL 3.6 11/16/2013 07:52 AM   LDLCALC 60 03/30/2019 09:02 AM    Wt Readings from Last 3 Encounters:  04/01/19 233 lb (105.7 kg)  09/08/18 233 lb (105.7 kg)  07/15/18 231 lb 6.4 oz (105 kg)     Objective:    Vital Signs:  BP 132/65   Ht 5\' 6"  (1.676 m)   Wt 233 lb (105.7 kg)   BMI 37.61 kg/m    Patient spoke in full sentences, not short of breath. No audible wheezing or coughing. Speech pattern normal.  ASSESSMENT & PLAN:    1.  CAD status post DES to the LAD and first diagonal in 2005.  Cardiac catheterization from March of last year is outlined above.  She has been symptomatically stable on current cardiac regimen and will continue long-term DAPT.  LVEF is normal by follow-up echocardiogram in June of last year.  2.  Mixed hyperlipidemia, recent LDL 60 on Lipitor.  No changes made today.  3.  Essential hypertension, systolic in the 834H.  COVID-19 Education: The signs and symptoms of COVID-19 were discussed  with the patient and how to seek care for testing (follow up with PCP or arrange E-visit).  The importance of social distancing was discussed today.  Time:   Today, I have spent 6 minutes with the patient with telehealth technology discussing the above problems.     Medication Adjustments/Labs and Tests Ordered: Current medicines are reviewed at length with the patient today.  Concerns regarding medicines are outlined above.   Tests Ordered: No orders of the defined types were placed in this encounter.   Medication Changes: No orders of the defined types were placed in this encounter.   Follow Up:  In Person 6 months in the [place] office.  Signed, Rozann Lesches, MD  04/01/2019 1:09 PM    [place]

## 2019-04-04 ENCOUNTER — Other Ambulatory Visit: Payer: Self-pay

## 2019-04-05 ENCOUNTER — Ambulatory Visit (INDEPENDENT_AMBULATORY_CARE_PROVIDER_SITE_OTHER): Payer: Medicare HMO | Admitting: Family Medicine

## 2019-04-05 DIAGNOSIS — M542 Cervicalgia: Secondary | ICD-10-CM

## 2019-04-05 DIAGNOSIS — R7303 Prediabetes: Secondary | ICD-10-CM

## 2019-04-05 DIAGNOSIS — E038 Other specified hypothyroidism: Secondary | ICD-10-CM

## 2019-04-05 DIAGNOSIS — Z1211 Encounter for screening for malignant neoplasm of colon: Secondary | ICD-10-CM | POA: Diagnosis not present

## 2019-04-05 DIAGNOSIS — G959 Disease of spinal cord, unspecified: Secondary | ICD-10-CM | POA: Diagnosis not present

## 2019-04-05 DIAGNOSIS — K219 Gastro-esophageal reflux disease without esophagitis: Secondary | ICD-10-CM | POA: Diagnosis not present

## 2019-04-05 MED ORDER — ISOSORBIDE MONONITRATE ER 30 MG PO TB24: ORAL_TABLET | ORAL | 1 refills | Status: DC

## 2019-04-05 MED ORDER — ATORVASTATIN CALCIUM 40 MG PO TABS: ORAL_TABLET | ORAL | 1 refills | Status: DC

## 2019-04-05 MED ORDER — CLOPIDOGREL BISULFATE 75 MG PO TABS: ORAL_TABLET | ORAL | 1 refills | Status: DC

## 2019-04-05 MED ORDER — FLUOXETINE HCL 20 MG PO CAPS: 40.0000 mg | ORAL_CAPSULE | Freq: Every day | ORAL | 2 refills | Status: DC

## 2019-04-05 MED ORDER — LEVOTHYROXINE SODIUM 125 MCG PO TABS: ORAL_TABLET | ORAL | 1 refills | Status: DC

## 2019-04-05 MED ORDER — PANTOPRAZOLE SODIUM 40 MG PO TBEC: DELAYED_RELEASE_TABLET | ORAL | 1 refills | Status: DC

## 2019-04-05 NOTE — Progress Notes (Signed)
Subjective:    Patient ID: [name], female    DOB: 06-20-1956, 63 y.o.   MRN: [id number] Telephone only Hyperlipidemia This is a chronic problem. The current episode started more than 1 year ago. Pertinent negatives include no chest pain or shortness of breath. Treatments tried: lipitor. There are no compliance problems.  Risk factors for coronary artery disease include dyslipidemia and post-menopausal.   Patient has thyroid condition.  Takes thyroid medication on a regular basis.  States that the proper way.  Relates compliance.  States no negative side effects.  States condition seems to be under good control.  Patient's thyroid medicine was adjusted she will repeat it again in 8 to 12 weeks Recent labs done these were reviewed in detail including slight elevation of glucose and importance of keeping sugars under good control  Patient does have ongoing trouble with reflux.  Takes medication on a regular basis.  Tries to minimize foods as best they can.  They understand the importance of dietary compliance.  May also try to avoid eating a large meal close to bedtime.  Patient denies any dysphagia denies hematochezia.  States medicine does a good job keeping the problem under good control.  Without the medication may certainly have issues.They desire to continue taking their medication.  Patient relates that medication does a good job with her she would like to continue it   Mild renal insuff-patient does do the best she can keep himself well-hydrated and eating healthy denies any type of azotemia symptoms  Patient does have underlying history of coronary artery disease she takes her Plavix metoprolol and half of the Imdur and her 81 mg aspirin denies any bleeding issues denies any chest pressure tightness pain shortness of breath does see her cardiologist on a yearly basis    Moods-moods are overall doing okay taking her Prozac regular basis denies any depression states under a lot  of stress but denies any severe anxiousness she is worried about her son who was recently diagnosed with prostate cancer  25 minutes was spent with the patient.  This statement verifies that 25 minutes was indeed spent with the patient.  More than 50% of this visit-total duration of the visit-was spent in counseling and coordination of care. The issues that the patient came in for today as reflected in the diagnosis (s) please refer to documentation for further details.    Review of Systems  Constitutional: Negative for activity change, appetite change and fatigue.  HENT: Negative for congestion and rhinorrhea.   Respiratory: Negative for cough and shortness of breath.   Cardiovascular: Negative for chest pain and leg swelling.  Gastrointestinal: Negative for abdominal pain, blood in stool, diarrhea and nausea.  Endocrine: Negative for polydipsia, polyphagia and polyuria.  Skin: Negative for color change.  Neurological: Negative for dizziness and weakness.  Psychiatric/Behavioral: Negative for behavioral problems, confusion and dysphoric mood.      Virtual Visit via Video Note  I connected with [name] on 04/05/19 at 10:00 AM EDT by a video enabled telemedicine application and verified that I am speaking with the correct person using two identifiers.  Location: Patient: home Provider: office   I discussed the limitations of evaluation and management by telemedicine and the availability of in person appointments. The patient expressed understanding and agreed to proceed.  History of Present Illness:    Observations/Objective:   Assessment and Plan:   Follow Up Instructions:    I discussed the assessment and treatment plan  with the patient. The patient was provided an opportunity to ask questions and all were answered. The patient agreed with the plan and demonstrated an understanding of the instructions.   The patient was advised to call back or seek an in-person  evaluation if the symptoms worsen or if the condition fails to improve as anticipated.  I provided 25 minutes of non-face-to-face time during this encounter.  The patient's BMI is elevated.  The patient does have obesity.  The patient does try to some degree staying active and watching diet.  It is in the vital signs and acknowledged.  It is above the recommended BMI for the patient's height and weight.  The patient has been counseled regarding healthy diet, restricted portions, avoiding excessive carbohydrates/sugary foods, and increase physical activity as health permits.  It is in the patient's best interest to lower the risk of secondary illness including heart disease strokes and cancer by losing weight.  The patient acknowledges this information.    Objective:   Physical Exam  Today's visit was via telephone Physical exam was not possible for this visit       Assessment & Plan:  Blood pressure reportedly good  Morbid obesity patient try to watch diet trying to stay active but with heat and humidity she cannot stand to be outside  She does have cervical myelopathy followed by a specialist they do injections periodically  She does take her amitriptyline at night by the specialist states is doing okay  She does have a history of heart disease denies any angina symptoms currently she does take her Plavix and her aspirin no bleeding issues Patient states that she was given to do a colonoscopy but when she came off the Plavix she had a heart attack so she is scared to do colonoscopy so we will do a stool test for blood  Reflux under good control she will continue current medications  Thyroid but was adjusted just few days ago based upon her lab work she will repeat this again in 8 to 12 weeks  Prediabetes patient will do the best she can at watching her diet we will recheck lab work again in 6 months time  Depression under good control anxiety somewhat up because of her son's  health issues

## 2019-04-14 DIAGNOSIS — G6289 Other specified polyneuropathies: Secondary | ICD-10-CM | POA: Diagnosis not present

## 2019-04-14 DIAGNOSIS — G5603 Carpal tunnel syndrome, bilateral upper limbs: Secondary | ICD-10-CM | POA: Diagnosis not present

## 2019-04-14 DIAGNOSIS — M545 Low back pain: Secondary | ICD-10-CM | POA: Diagnosis not present

## 2019-04-14 DIAGNOSIS — M797 Fibromyalgia: Secondary | ICD-10-CM | POA: Diagnosis not present

## 2019-04-14 DIAGNOSIS — M542 Cervicalgia: Secondary | ICD-10-CM | POA: Diagnosis not present

## 2019-05-14 ENCOUNTER — Other Ambulatory Visit: Payer: Self-pay | Admitting: Nurse Practitioner

## 2019-06-29 DIAGNOSIS — H25813 Combined forms of age-related cataract, bilateral: Secondary | ICD-10-CM | POA: Diagnosis not present

## 2019-06-29 DIAGNOSIS — H52223 Regular astigmatism, bilateral: Secondary | ICD-10-CM | POA: Diagnosis not present

## 2019-06-29 DIAGNOSIS — H5203 Hypermetropia, bilateral: Secondary | ICD-10-CM | POA: Diagnosis not present

## 2019-06-29 DIAGNOSIS — H524 Presbyopia: Secondary | ICD-10-CM | POA: Diagnosis not present

## 2019-07-07 DIAGNOSIS — M797 Fibromyalgia: Secondary | ICD-10-CM | POA: Diagnosis not present

## 2019-07-07 DIAGNOSIS — G5603 Carpal tunnel syndrome, bilateral upper limbs: Secondary | ICD-10-CM | POA: Diagnosis not present

## 2019-07-07 DIAGNOSIS — M542 Cervicalgia: Secondary | ICD-10-CM | POA: Diagnosis not present

## 2019-07-07 DIAGNOSIS — G6289 Other specified polyneuropathies: Secondary | ICD-10-CM | POA: Diagnosis not present

## 2019-07-07 DIAGNOSIS — M545 Low back pain: Secondary | ICD-10-CM | POA: Diagnosis not present

## 2019-07-14 DIAGNOSIS — H2513 Age-related nuclear cataract, bilateral: Secondary | ICD-10-CM | POA: Diagnosis not present

## 2019-07-14 DIAGNOSIS — H25013 Cortical age-related cataract, bilateral: Secondary | ICD-10-CM | POA: Diagnosis not present

## 2019-07-14 DIAGNOSIS — H18413 Arcus senilis, bilateral: Secondary | ICD-10-CM | POA: Diagnosis not present

## 2019-07-14 DIAGNOSIS — H25043 Posterior subcapsular polar age-related cataract, bilateral: Secondary | ICD-10-CM | POA: Diagnosis not present

## 2019-07-14 DIAGNOSIS — H2512 Age-related nuclear cataract, left eye: Secondary | ICD-10-CM | POA: Diagnosis not present

## 2019-08-17 DIAGNOSIS — H2512 Age-related nuclear cataract, left eye: Secondary | ICD-10-CM | POA: Diagnosis not present

## 2019-08-18 DIAGNOSIS — H2511 Age-related nuclear cataract, right eye: Secondary | ICD-10-CM | POA: Diagnosis not present

## 2019-08-23 DIAGNOSIS — H2512 Age-related nuclear cataract, left eye: Secondary | ICD-10-CM | POA: Diagnosis not present

## 2019-09-14 DIAGNOSIS — H2511 Age-related nuclear cataract, right eye: Secondary | ICD-10-CM | POA: Diagnosis not present

## 2019-09-15 DIAGNOSIS — M542 Cervicalgia: Secondary | ICD-10-CM | POA: Diagnosis not present

## 2019-09-15 DIAGNOSIS — R201 Hypoesthesia of skin: Secondary | ICD-10-CM | POA: Diagnosis not present

## 2019-09-15 DIAGNOSIS — M545 Low back pain: Secondary | ICD-10-CM | POA: Diagnosis not present

## 2019-09-15 DIAGNOSIS — G6289 Other specified polyneuropathies: Secondary | ICD-10-CM | POA: Diagnosis not present

## 2019-09-15 DIAGNOSIS — Z79899 Other long term (current) drug therapy: Secondary | ICD-10-CM | POA: Diagnosis not present

## 2019-09-29 ENCOUNTER — Other Ambulatory Visit: Payer: Self-pay | Admitting: Family Medicine

## 2019-09-29 DIAGNOSIS — E538 Deficiency of other specified B group vitamins: Secondary | ICD-10-CM

## 2019-09-29 DIAGNOSIS — E782 Mixed hyperlipidemia: Secondary | ICD-10-CM

## 2019-09-29 DIAGNOSIS — R7303 Prediabetes: Secondary | ICD-10-CM

## 2019-09-29 DIAGNOSIS — I10 Essential (primary) hypertension: Secondary | ICD-10-CM

## 2019-09-29 DIAGNOSIS — E039 Hypothyroidism, unspecified: Secondary | ICD-10-CM

## 2019-09-29 DIAGNOSIS — Z79899 Other long term (current) drug therapy: Secondary | ICD-10-CM

## 2019-09-29 NOTE — Telephone Encounter (Signed)
May have 90 days on medications Patient does need to do lab work with follow-up office visit this follow-up office visit can be in person or virtual Lab work needs to include U9W, metabolic 7, lipid, liver, TSH, vitamin B12 Vitamin B12 deficiency, hyperlipidemia, prediabetes, hypertension, heart disease thank you

## 2019-09-30 ENCOUNTER — Telehealth: Payer: Self-pay | Admitting: Cardiology

## 2019-09-30 NOTE — Telephone Encounter (Signed)

## 2019-10-12 DIAGNOSIS — Z0100 Encounter for examination of eyes and vision without abnormal findings: Secondary | ICD-10-CM | POA: Diagnosis not present

## 2019-10-17 DIAGNOSIS — E538 Deficiency of other specified B group vitamins: Secondary | ICD-10-CM | POA: Diagnosis not present

## 2019-10-17 DIAGNOSIS — R7303 Prediabetes: Secondary | ICD-10-CM | POA: Diagnosis not present

## 2019-10-17 DIAGNOSIS — E782 Mixed hyperlipidemia: Secondary | ICD-10-CM | POA: Diagnosis not present

## 2019-10-17 DIAGNOSIS — Z79899 Other long term (current) drug therapy: Secondary | ICD-10-CM | POA: Diagnosis not present

## 2019-10-17 DIAGNOSIS — E039 Hypothyroidism, unspecified: Secondary | ICD-10-CM | POA: Diagnosis not present

## 2019-10-17 DIAGNOSIS — I10 Essential (primary) hypertension: Secondary | ICD-10-CM | POA: Diagnosis not present

## 2019-10-18 ENCOUNTER — Telehealth (INDEPENDENT_AMBULATORY_CARE_PROVIDER_SITE_OTHER): Payer: Medicare HMO | Admitting: Cardiology

## 2019-10-18 ENCOUNTER — Encounter: Payer: Self-pay | Admitting: Cardiology

## 2019-10-18 VITALS — BP 110/70 | Ht 66.0 in | Wt 240.0 lb

## 2019-10-18 DIAGNOSIS — E782 Mixed hyperlipidemia: Secondary | ICD-10-CM

## 2019-10-18 DIAGNOSIS — Z8679 Personal history of other diseases of the circulatory system: Secondary | ICD-10-CM

## 2019-10-18 DIAGNOSIS — I10 Essential (primary) hypertension: Secondary | ICD-10-CM

## 2019-10-18 DIAGNOSIS — I25119 Atherosclerotic heart disease of native coronary artery with unspecified angina pectoris: Secondary | ICD-10-CM | POA: Diagnosis not present

## 2019-10-18 LAB — BASIC METABOLIC PANEL
BUN/Creatinine Ratio: 11 — ABNORMAL LOW (ref 12–28)
BUN: 10 mg/dL (ref 8–27)
CO2: 23 mmol/L (ref 20–29)
Calcium: 9 mg/dL (ref 8.7–10.3)
Chloride: 106 mmol/L (ref 96–106)
Creatinine, Ser: 0.93 mg/dL (ref 0.57–1.00)
GFR calc Af Amer: 75 mL/min/{1.73_m2} (ref >59)
GFR calc non Af Amer: 65 mL/min/{1.73_m2} (ref >59)
Glucose: 111 mg/dL — ABNORMAL HIGH (ref 65–99)
Potassium: 3.9 mmol/L (ref 3.5–5.2)
Sodium: 143 mmol/L (ref 134–144)

## 2019-10-18 LAB — LIPID PANEL
Chol/HDL Ratio: 2.2 ratio (ref 0.0–4.4)
Cholesterol, Total: 136 mg/dL (ref 100–199)
HDL: 62 mg/dL (ref >39)
LDL Chol Calc (NIH): 56 mg/dL (ref 0–99)
Triglycerides: 97 mg/dL (ref 0–149)
VLDL Cholesterol Cal: 18 mg/dL (ref 5–40)

## 2019-10-18 LAB — HEPATIC FUNCTION PANEL
ALT: 24 IU/L (ref 0–32)
AST: 25 IU/L (ref 0–40)
Albumin: 3.3 g/dL — ABNORMAL LOW (ref 3.8–4.8)
Alkaline Phosphatase: 142 IU/L — ABNORMAL HIGH (ref 39–117)
Bilirubin Total: 0.3 mg/dL (ref 0.0–1.2)
Bilirubin, Direct: 0.11 mg/dL (ref 0.00–0.40)
Total Protein: 6.4 g/dL (ref 6.0–8.5)

## 2019-10-18 LAB — HEMOGLOBIN A1C
Est. average glucose Bld gHb Est-mCnc: 128 mg/dL
Hgb A1c MFr Bld: 6.1 % — ABNORMAL HIGH (ref 4.8–5.6)

## 2019-10-18 LAB — TSH: TSH: 0.58 u[IU]/mL (ref 0.450–4.500)

## 2019-10-18 LAB — VITAMIN B12: Vitamin B-12: 1071 pg/mL (ref 232–1245)

## 2019-10-18 NOTE — Patient Instructions (Addendum)

## 2019-10-18 NOTE — Progress Notes (Signed)
Virtual Visit via Telephone Note   This visit type was conducted due to national recommendations for restrictions regarding the COVID-19 Pandemic (e.g. social distancing) in an effort to limit this patient's exposure and mitigate transmission in our community.  Due to her co-morbid illnesses, this patient is at least at moderate risk for complications without adequate follow up.  This format is felt to be most appropriate for this patient at this time.  The patient did not have access to video technology/had technical difficulties with video requiring transitioning to audio format only (telephone).  All issues noted in this document were discussed and addressed.  No physical exam could be performed with this format.  Please refer to the patient's chart for her  consent to telehealth for [facility].   The patient was identified using 2 identifiers.  Date:  10/18/2019   ID:  [name], DOB 25-Aug-1955, MRN [id number]  Patient Location: Home Provider Location: Office  PCP:  Kathyrn Drown, MD  Cardiologist:  Rozann Lesches, MD Electrophysiologist:  None   Evaluation Performed:  Follow-Up Visit  Chief Complaint:  Cardiac follow-up  History of Present Illness:    [name] is a 64 y.o. female last assessed via telehealth encounter in August 2020.  We spoke by phone today.  She states that she has done well, no angina or nitroglycerin use since last encounter.  She rode her motorcycle for about 3 hours this past Friday when the weather was good.  I reviewed her recent lab work obtained by Dr. Wolfgang Phoenix as outlined below.  LDL was well controlled at 56 on current dose of Lipitor.  She is no longer on aspirin due to frequent bruising, has maintained Plavix and otherwise continues on Toprol-XL, Imdur, and Lipitor.  Past Medical History:  Diagnosis Date  . Anxiety   . Arthritis   . Asthma   . Coronary atherosclerosis of native coronary artery    a. s/p DES to LAD and D1 in  2005 b. cath in 11/2015 showing patent stents with mild disease along RCA and LCx  . Depression    Prior suicide attempt  . Essential hypertension   . Fibromyalgia   . GERD (gastroesophageal reflux disease)   . History of blood transfusion 2005  . History of kidney stones   . Hyperlipidemia   . Hypothyroidism   . Myocardial infarction ([place]) 2017  . NSTEMI (non-ST elevated myocardial infarction) ([place]) 2005  . Palpitations    PVC's have been noted  . Sleep apnea    Past Surgical History:  Procedure Laterality Date  . CARDIAC CATHETERIZATION  ~ 2009/2010  . CARDIAC CATHETERIZATION N/A 11/21/2015   Procedure: Left Heart Cath and Coronary Angiography;  Surgeon: Burnell Blanks, MD;  Location: [place] [facility];  Service: Cardiovascular;  Laterality: N/A;  . CARDIAC CATHETERIZATION  10/21/2017  . CARPAL TUNNEL RELEASE Bilateral 2007- 2008  . CORONARY ANGIOPLASTY WITH STENT PLACEMENT  2005  . FOOT BONE EXCISION     Sesmoid bone left foot-repair of fracture  . LEFT HEART CATH AND CORONARY ANGIOGRAPHY N/A 10/21/2017   Procedure: LEFT HEART CATH AND CORONARY ANGIOGRAPHY;  Surgeon: Wellington Hampshire, MD;  Location: [place] [facility];  Service: Cardiovascular;  Laterality: N/A;  . TUBAL LIGATION       Current Meds  Medication Sig  . amitriptyline (ELAVIL) 25 MG tablet Take 1 tablet by mouth at bedtime.  Marland Kitchen atorvastatin (LIPITOR) 40 MG tablet TAKE 1 TABLET EVERY DAY (  STOP THE 80MG )  . Biotin 10000 MCG TABS Take 1 tablet by mouth daily.  . clopidogrel (PLAVIX) 75 MG tablet TAKE 1 TABLET EVERY DAY  . Cyanocobalamin (VITAMIN B-12 PO) Take 1 tablet by mouth daily.  . DUREZOL 0.05 % EMUL Place 1 drop into the right eye 3 (three) times daily.  Marland Kitchen FLUoxetine (PROZAC) 20 MG capsule Take 2 capsules (40 mg total) by mouth daily. (Patient taking differently: Take 60 mg by mouth daily. )  . HYDROcodone-acetaminophen (NORCO/VICODIN) 5-325 MG tablet Take 1 tablet by mouth every 6 (six) hours as  needed for moderate pain.  . isosorbide mononitrate (IMDUR) 30 MG 24 hr tablet TAKE 1/2 TABLET ONE TIME DAILY  . levothyroxine (SYNTHROID) 125 MCG tablet TAKE 1/2 TABLET ON SUNDAYS AND ONE TABLET ALL OTHER DAYS  . lidocaine (LIDODERM) 5 % Place 1 patch onto the skin daily as needed.  . metoprolol succinate (TOPROL-XL) 50 MG 24 hr tablet TAKE 1 TABLET (50 MG TOTAL) BY MOUTH DAILY. TAKE WITH OR IMMEDIATELY FOLLOWING A MEAL.  . nitroGLYCERIN (NITROSTAT) 0.4 MG SL tablet DISSOLVE 1 TABLET UNDER TONGUE EVERY 5 MINUTES FOR UP TO 3 DOSES AS NEEDED FOR CHEST PAIN.  Marland Kitchen pantoprazole (PROTONIX) 40 MG tablet TAKE 1 TABLET EVERY DAY  . PROLENSA 0.07 % SOLN Place 1 drop into the right eye at bedtime.  Marland Kitchen tiZANidine (ZANAFLEX) 4 MG tablet Take 1-2 tablets by mouth daily.     Allergies:   Albuterol, Cymbalta [duloxetine hcl], and Lyrica [pregabalin]   ROS:   Chronic back pain, still gets intermittent spine injections.  Prior CV studies:   The following studies were reviewed today:  Echocardiogram6/20/2019: Study Conclusions  - Left ventricle: The cavity size was normal. Wall thickness was increased in a pattern of mild LVH. Systolic function was normal. The estimated ejection fraction was in the range of 55% to 60%. Diastolic function is abnormal, indeterminant grade. - Aortic valve: Valve area (VTI): 2.73 cm^2. Valve area (Vmax): 2.98 cm^2. Valve area (Vmean): 2.67 cm^2. - Technically adequate study.  Cardiac catheterization 10/21/2017:  Prox RCA to Dist RCA lesion is 20% stenosed.  Previously placed Ost 1st Diag stent (unknown type) is widely patent.  Previously placed Prox LAD stent (unknown type) is widely patent.  LV end diastolic pressure is moderately elevated.  1. Patent LAD/diagonal stents with no evidence of obstructive coronary artery disease. 2. Iatrogenic myocardial contusion and microperforation caused by left ventricular angiography with manual injection. It appears  that the catheter tip was into the myocardium. The patiet had no symptoms throughout this with no arrhythmia, instability or hypotension.  Labs/Other Tests and Data Reviewed:    EKG:  An ECG dated 10/21/2017 was personally reviewed today and demonstrated:  Sinus bradycardia with poor R wave progression.  Recent Labs: 10/17/2019: ALT 24; BUN 10; Creatinine, Ser 0.93; Potassium 3.9; Sodium 143; TSH 0.580   Recent Lipid Panel Lab Results  Component Value Date/Time   CHOL 136 10/17/2019 11:50 AM   TRIG 97 10/17/2019 11:50 AM   HDL 62 10/17/2019 11:50 AM   CHOLHDL 2.2 10/17/2019 11:50 AM   CHOLHDL 3.6 11/16/2013 07:52 AM   LDLCALC 56 10/17/2019 11:50 AM    Wt Readings from Last 3 Encounters:  10/18/19 240 lb (108.9 kg)  04/01/19 233 lb (105.7 kg)  09/08/18 233 lb (105.7 kg)     Objective:    Vital Signs:  BP 110/70   Ht 5\' 6"  (1.676 m)   Wt 240 lb (108.9 kg)  BMI 38.74 kg/m    Patient spoke in full sentences, not short of breath. No audible wheezing or coughing. Speech pattern normal.  ASSESSMENT & PLAN:    1.  CAD status post DES to the LAD and first diagonal in 2005.  Cardiac catheterization from March 2019 revealed patent stent sites.  She continues on Plavix, Toprol-XL, Imdur, and Lipitor.  2.  Mixed hyperlipidemia, continues on Lipitor with recent LDL 56.  No changes were made today.   Time:   Today, I have spent 5 minutes with the patient with telehealth technology discussing the above problems.     Medication Adjustments/Labs and Tests Ordered: Current medicines are reviewed at length with the patient today.  Concerns regarding medicines are outlined above.   Tests Ordered: No orders of the defined types were placed in this encounter.   Medication Changes: No orders of the defined types were placed in this encounter.   Follow Up:  In Person 6 months in the [place] office.  Signed, Rozann Lesches, MD  10/18/2019 10:08 AM    [place]

## 2019-10-20 ENCOUNTER — Encounter: Payer: Self-pay | Admitting: Family Medicine

## 2019-11-10 DIAGNOSIS — Z79899 Other long term (current) drug therapy: Secondary | ICD-10-CM | POA: Diagnosis not present

## 2019-11-10 DIAGNOSIS — M542 Cervicalgia: Secondary | ICD-10-CM | POA: Diagnosis not present

## 2019-11-10 DIAGNOSIS — M545 Low back pain: Secondary | ICD-10-CM | POA: Diagnosis not present

## 2019-11-29 ENCOUNTER — Telehealth: Payer: Self-pay | Admitting: *Deleted

## 2019-11-29 ENCOUNTER — Other Ambulatory Visit: Payer: Self-pay

## 2019-11-29 ENCOUNTER — Telehealth (INDEPENDENT_AMBULATORY_CARE_PROVIDER_SITE_OTHER): Payer: Medicare HMO | Admitting: Family Medicine

## 2019-11-29 ENCOUNTER — Encounter: Payer: Self-pay | Admitting: Family Medicine

## 2019-11-29 VITALS — BP 113/76

## 2019-11-29 DIAGNOSIS — F324 Major depressive disorder, single episode, in partial remission: Secondary | ICD-10-CM | POA: Diagnosis not present

## 2019-11-29 DIAGNOSIS — K760 Fatty (change of) liver, not elsewhere classified: Secondary | ICD-10-CM | POA: Diagnosis not present

## 2019-11-29 DIAGNOSIS — K219 Gastro-esophageal reflux disease without esophagitis: Secondary | ICD-10-CM

## 2019-11-29 DIAGNOSIS — E038 Other specified hypothyroidism: Secondary | ICD-10-CM

## 2019-11-29 DIAGNOSIS — E782 Mixed hyperlipidemia: Secondary | ICD-10-CM | POA: Diagnosis not present

## 2019-11-29 MED ORDER — CLOPIDOGREL BISULFATE 75 MG PO TABS: 75.0000 mg | ORAL_TABLET | Freq: Every day | ORAL | 1 refills | Status: DC

## 2019-11-29 MED ORDER — LEVOTHYROXINE SODIUM 125 MCG PO TABS: ORAL_TABLET | ORAL | 1 refills | Status: DC

## 2019-11-29 MED ORDER — ATORVASTATIN CALCIUM 40 MG PO TABS: ORAL_TABLET | ORAL | 1 refills | Status: DC

## 2019-11-29 MED ORDER — ISOSORBIDE MONONITRATE ER 30 MG PO TB24: ORAL_TABLET | ORAL | 1 refills | Status: DC

## 2019-11-29 MED ORDER — FLUOXETINE HCL 20 MG PO CAPS: ORAL_CAPSULE | ORAL | 2 refills | Status: DC

## 2019-11-29 MED ORDER — PANTOPRAZOLE SODIUM 40 MG PO TBEC: DELAYED_RELEASE_TABLET | ORAL | 1 refills | Status: DC

## 2019-11-29 NOTE — Telephone Encounter (Signed)
[name], [name] are scheduled for a virtual visit with your provider today.    Just as we do with appointments in the office, we must obtain your consent to participate.  Your consent will be active for this visit and any virtual visit you may have with one of our providers in the next 365 days.    If you have a MyChart account, I can also send a copy of this consent to you electronically.  All virtual visits are billed to your insurance company just like a traditional visit in the office.  As this is a virtual visit, video technology does not allow for your provider to perform a traditional examination.  This may limit your provider's ability to fully assess your condition.  If your provider identifies any concerns that need to be evaluated in person or the need to arrange testing such as labs, EKG, etc, we will make arrangements to do so.    Although advances in technology are sophisticated, we cannot ensure that it will always work on either your end or our end.  If the connection with a video visit is poor, we may have to switch to a telephone visit.  With either a video or telephone visit, we are not always able to ensure that we have a secure connection.   I need to obtain your verbal consent now.   Are you willing to proceed with your visit today?   [name] has provided verbal consent on 11/29/2019 for a virtual visit (video or telephone).   Patsy Lager, LPN 3/57/0177  93:90 AM

## 2019-11-29 NOTE — Progress Notes (Signed)
Subjective:    Patient ID: [name], female    DOB: 11/21/1955, 64 y.o.   MRN: [id number]  HPI Patient calls in today for a check up on her medications. Patient is taking metoprolol for blood pressure, bp today is 113/76.  Patient states she is having a hard time and would like to discuss seeing someone for mental health. Patient states she is not suicidal.  See PHQ9 and GAD 7.   Telemedicine from 11/29/2019 in [place]  PHQ-9 Total Score  8     GAD 7 : Generalized Anxiety Score 11/29/2019  Nervous, Anxious, on Edge 1  Control/stop worrying 0  Worry too much - different things 0  Trouble relaxing 1  Restless 0  Easily annoyed or irritable 3  Afraid - awful might happen 0  Total GAD 7 Score 5  Anxiety Difficulty Somewhat difficult   Depression screen [facility] 2/9 11/29/2019 03/10/2017 10/21/2016  Decreased Interest 2 0 0  Down, Depressed, Hopeless 2 0 0  PHQ - 2 Score 4 0 0  Altered sleeping 2 0 -  Tired, decreased energy 0 0 -  Change in appetite 2 0 -  Feeling bad or failure about yourself  0 0 -  Trouble concentrating 0 0 -  Moving slowly or fidgety/restless 0 0 -  Suicidal thoughts 0 0 -  PHQ-9 Score 8 0 -  Difficult doing work/chores Somewhat difficult - -   Other specified hypothyroidism  Gastroesophageal reflux disease without esophagitis  Fatty liver disease, nonalcoholic  Mixed hyperlipidemia  Morbid obesity (HCC)  Depression, major, single episode, in partial remission ([place]) - Plan: Ambulatory referral to Psychiatry  This patient suffers with morbid obesity she is trying to watch her diet try to lose weight she states her reflux under good control.  Does have a history of fatty liver hyperlipidemia heart disease and also depression.  Taking her antidepressants taking 3 Prozac's daily but feels that she would benefit from seeing a psychiatrist.  Patient denies being suicidal   Patient also states she has really dry elbows and nohting  seems to help. Virtual Visit via Video Note  I connected with [name] on 11/29/19 at  9:00 AM EDT by a video enabled telemedicine application and verified that I am speaking with the correct person using two identifiers.  Location: Patient: home Provider: office   I discussed the limitations of evaluation and management by telemedicine and the availability of in person appointments. The patient expressed understanding and agreed to proceed.  History of Present Illness:    Observations/Objective:   Assessment and Plan:   Follow Up Instructions:    I discussed the assessment and treatment plan with the patient. The patient was provided an opportunity to ask questions and all were answered. The patient agreed with the plan and demonstrated an understanding of the instructions.   The patient was advised to call back or seek an in-person evaluation if the symptoms worsen or if the condition fails to improve as anticipated.  I provided 30 minutes of non-face-to-face time during this encounter.    Review of Systems  Constitutional: Negative for activity change and appetite change.  HENT: Negative for congestion and rhinorrhea.   Respiratory: Negative for cough and shortness of breath.   Cardiovascular: Negative for chest pain and leg swelling.  Gastrointestinal: Negative for abdominal pain, nausea and vomiting.  Skin: Negative for color change.  Neurological: Negative for dizziness and weakness.  Psychiatric/Behavioral: Negative for agitation and  confusion.       Objective:   Physical Exam   Today's visit was via telephone Physical exam was not possible for this visit      Assessment & Plan:  1. Other specified hypothyroidism TSH looks good takes her medicine regular basis continue current measures  2. Gastroesophageal reflux disease without esophagitis Reflux under good control refills given  3. Fatty liver disease, nonalcoholic Liver enzymes look good  patient is working hard at trying to lose weight she has gained 20 pounds during the pandemic  4. Mixed hyperlipidemia Cholesterol profile looks great continue current measures very important for patient to watch diet take medication  5. Morbid obesity ([place]) Patient was encouraged to try to lose weight by portion control and regular physical activity  6. Depression, major, single episode, in partial remission ([place]) Under a lot of stress denies being suicidal states her medicine is helping but she would like to see a psychiatrist recommend referral [facility] - Ambulatory referral to Psychiatry  Patient defers on Covid vaccine

## 2019-12-05 ENCOUNTER — Encounter: Payer: Self-pay | Admitting: Family Medicine

## 2019-12-07 ENCOUNTER — Other Ambulatory Visit (INDEPENDENT_AMBULATORY_CARE_PROVIDER_SITE_OTHER): Payer: Medicare HMO | Admitting: *Deleted

## 2019-12-07 ENCOUNTER — Other Ambulatory Visit: Payer: Self-pay

## 2019-12-07 DIAGNOSIS — Z23 Encounter for immunization: Secondary | ICD-10-CM

## 2020-01-19 DIAGNOSIS — M545 Low back pain: Secondary | ICD-10-CM | POA: Diagnosis not present

## 2020-01-19 DIAGNOSIS — M542 Cervicalgia: Secondary | ICD-10-CM | POA: Diagnosis not present

## 2020-01-19 DIAGNOSIS — R201 Hypoesthesia of skin: Secondary | ICD-10-CM | POA: Diagnosis not present

## 2020-01-19 DIAGNOSIS — M797 Fibromyalgia: Secondary | ICD-10-CM | POA: Diagnosis not present

## 2020-01-19 DIAGNOSIS — Z79899 Other long term (current) drug therapy: Secondary | ICD-10-CM | POA: Diagnosis not present

## 2020-02-03 ENCOUNTER — Other Ambulatory Visit: Payer: Self-pay | Admitting: Cardiology

## 2020-03-01 ENCOUNTER — Encounter: Payer: Self-pay | Admitting: Adult Health

## 2020-03-01 ENCOUNTER — Other Ambulatory Visit: Payer: Self-pay

## 2020-03-01 ENCOUNTER — Ambulatory Visit (INDEPENDENT_AMBULATORY_CARE_PROVIDER_SITE_OTHER): Payer: Medicare HMO | Admitting: Adult Health

## 2020-03-01 VITALS — BP 120/69 | HR 61 | Ht 66.0 in | Wt 230.0 lb

## 2020-03-01 DIAGNOSIS — F410 Panic disorder [episodic paroxysmal anxiety] without agoraphobia: Secondary | ICD-10-CM | POA: Diagnosis not present

## 2020-03-01 DIAGNOSIS — F411 Generalized anxiety disorder: Secondary | ICD-10-CM | POA: Diagnosis not present

## 2020-03-01 DIAGNOSIS — F331 Major depressive disorder, recurrent, moderate: Secondary | ICD-10-CM

## 2020-03-01 MED ORDER — ALPRAZOLAM 0.5 MG PO TABS: ORAL_TABLET | ORAL | 2 refills | Status: DC

## 2020-03-01 NOTE — Progress Notes (Signed)
[facility] MD/PA/NP Initial Note  03/01/2020 12:05 PM [name]  MRN:  [id number]  Chief Complaint:   HPI:   Describes mood today as "ok". Pleasant.  Mood symptoms - reports depression, anxiety, and irritability. More anxious overall. Stating "I have a lot of health issues". Concerned about heart issues. Having chest pains - using nitro at times. Has suffered from panic attacks and anxiety for several years. Was started on Xanax to help alleviate symptoms but previous provider retired and new provider would not continue. Has had 4 heart attacks and is afraid of having another. Would like to restart a low dose of the Xanax. Does not feel like she will use it everyday - "only when I need it". Stable interest and motivation. Taking medications as prescribed.  Energy levels varies on pain levels. Diagnosed with Fibromyalgia years ago. Has bone spurs on her neck. Active, does not have a regular exercise routine.  Enjoys some usual interests and activities. Married - 2nd marriage. Lives with husband and his 2 sons 58 and 54. They came to live with them 4 years ago. She has 3 grown sons - one doesn't speak to her because she has helped his stepbrothers. Oldest son in [place]. One son in [place]. Has 8 grandchildren and 2 great grandchildren. Plans to go to [place] to visit. Parents deceased 2005-08-16. Spending time with family. Appetite adequate. Weight "up and down" - 230 pounds. History of being a stress eater. Suffered from [place] in her 20's. Stating "it wrecked my teeth and hair".  Sleeps well most nights. Neurology has given her Tizanidine if needed. Sleeps better on days she can be active. Averages 8 hours. Focus and concentration difficulties at times. Easily distracted. Starting tasks and moving on - "I eventually get everything done". Completing tasks. Managing aspects of household. Retired - worked as a Emergency planning/management officer for 40 years.  Denies SI or HI. Denies AH or VH.  Previous medication  trials: Zoloft, Cymbalta  Visit Diagnosis:    ICD-10-CM   1. Generalized anxiety disorder  F41.1 ALPRAZolam (XANAX) 0.5 MG tablet  2. Major depressive disorder, recurrent episode, moderate (HCC)  F33.1   3. Panic attacks  F41.0     Past Psychiatric History: Denies psychiatric hospitalization.Seen at [facility] several years. Family doctor treating her for mental health issues.   Past Medical History:  Past Medical History:  Diagnosis Date  . Anxiety   . Arthritis   . Asthma   . Coronary atherosclerosis of native coronary artery    a. s/p DES to LAD and D1 in 2005 b. cath in 11/2015 showing patent stents with mild disease along RCA and LCx  . Depression    Prior suicide attempt  . Essential hypertension   . Fibromyalgia   . GERD (gastroesophageal reflux disease)   . History of blood transfusion 2005  . History of kidney stones   . Hyperlipidemia   . Hypothyroidism   . Myocardial infarction ([place]) 2017  . NSTEMI (non-ST elevated myocardial infarction) ([place]) 2005  . Palpitations    PVC's have been noted  . Sleep apnea     Past Surgical History:  Procedure Laterality Date  . CARDIAC CATHETERIZATION  ~ 2009/2010  . CARDIAC CATHETERIZATION N/A 11/21/2015   Procedure: Left Heart Cath and Coronary Angiography;  Surgeon: Burnell Blanks, MD;  Location: [facility];  Service: Cardiovascular;  Laterality: N/A;  . CARDIAC CATHETERIZATION  10/21/2017  . CARPAL TUNNEL RELEASE Bilateral 2007- 2008  .  CORONARY ANGIOPLASTY WITH STENT PLACEMENT  2005  . FOOT BONE EXCISION     Sesmoid bone left foot-repair of fracture  . LEFT HEART CATH AND CORONARY ANGIOGRAPHY N/A 10/21/2017   Procedure: LEFT HEART CATH AND CORONARY ANGIOGRAPHY;  Surgeon: Wellington Hampshire, MD;  Location: [place] [facility];  Service: Cardiovascular;  Laterality: N/A;  . TUBAL LIGATION      Family Psychiatric History: Has 2 sons that are bipolar.   Family History:  Family History   Problem Relation Age of Onset  . Coronary artery disease Father        MI age 14  . Heart failure Father   . Heart failure Mother   . Anesthesia problems Neg Hx   . Hypotension Neg Hx   . Malignant hyperthermia Neg Hx   . Pseudochol deficiency Neg Hx     Social History:  Social History   Socioeconomic History  . Marital status: Married    Spouse name: Not on file  . Number of children: Not on file  . Years of education: Not on file  . Highest education level: Not on file  Occupational History  . Not on file  Tobacco Use  . Smoking status: Current Every Day Smoker    Packs/day: 0.33    Years: 49.00    Pack years: 16.17    Types: Cigarettes    Start date: 05/31/1969  . Smokeless tobacco: Never Used  . Tobacco comment: 1 pack per week & vapes also   Vaping Use  . Vaping Use: Every day  Substance and Sexual Activity  . Alcohol use: No    Alcohol/week: 0.0 standard drinks  . Drug use: No  . Sexual activity: Yes    Birth control/protection: Post-menopausal  Other Topics Concern  . Not on file  Social History Narrative  . Not on file   Social Determinants of Health   Financial Resource Strain:   . Difficulty of Paying Living Expenses:   Food Insecurity:   . Worried About Charity fundraiser in the Last Year:   . Arboriculturist in the Last Year:   Transportation Needs:   . Film/video editor (Medical):   Marland Kitchen Lack of Transportation (Non-Medical):   Physical Activity:   . Days of Exercise per Week:   . Minutes of Exercise per Session:   Stress:   . Feeling of Stress :   Social Connections:   . Frequency of Communication with Friends and Family:   . Frequency of Social Gatherings with Friends and Family:   . Attends Religious Services:   . Active Member of Clubs or Organizations:   . Attends Archivist Meetings:   Marland Kitchen Marital Status:     Allergies:  Allergies  Allergen Reactions  . Albuterol Other (See Comments)    Tremors  . Cymbalta  [Duloxetine Hcl] Other (See Comments)    Felt bad  . Lyrica [Pregabalin] Other (See Comments)    Retained fluids    Metabolic Disorder Labs: Lab Results  Component Value Date   HGBA1C 6.1 (H) 10/17/2019   MPG 120 (H) 11/16/2013   No results found for: PROLACTIN Lab Results  Component Value Date   CHOL 136 10/17/2019   TRIG 97 10/17/2019   HDL 62 10/17/2019   CHOLHDL 2.2 10/17/2019   VLDL 24 11/16/2013   LDLCALC 56 10/17/2019   LDLCALC 60 03/30/2019   Lab Results  Component Value Date   TSH 0.580 10/17/2019  TSH 0.358 (L) 03/30/2019    Therapeutic Level Labs: No results found for: LITHIUM No results found for: VALPROATE No components found for:  CBMZ  Current Medications: Current Outpatient Medications  Medication Sig Dispense Refill  . amitriptyline (ELAVIL) 25 MG tablet Take 1 tablet by mouth at bedtime.    Marland Kitchen atorvastatin (LIPITOR) 40 MG tablet TAKE 1 TABLET EVERY DAY (STOP THE 80MG ) 90 tablet 1  . Biotin 10000 MCG TABS Take 1 tablet by mouth daily.    . clopidogrel (PLAVIX) 75 MG tablet Take 1 tablet (75 mg total) by mouth daily. 90 tablet 1  . Cyanocobalamin (VITAMIN B-12 PO) Take 1 tablet by mouth daily.    . DUREZOL 0.05 % EMUL Place 1 drop into the right eye 3 (three) times daily.    Marland Kitchen FLUoxetine (PROZAC) 20 MG capsule Take 3 capsule po daily 270 capsule 2  . HYDROcodone-acetaminophen (NORCO/VICODIN) 5-325 MG tablet Take 1 tablet by mouth every 6 (six) hours as needed for moderate pain.    . isosorbide mononitrate (IMDUR) 30 MG 24 hr tablet TAKE 1/2 TABLET ONE TIME DAILY 45 tablet 1  . levothyroxine (SYNTHROID) 125 MCG tablet TAKE 1/2 TABLET ON SUNDAYS AND ONE TABLET ALL OTHER DAYS 90 tablet 1  . lidocaine (LIDODERM) 5 % Place 1 patch onto the skin daily as needed.    . metoprolol succinate (TOPROL-XL) 50 MG 24 hr tablet TAKE 1 TABLET (50 MG TOTAL) BY MOUTH DAILY. TAKE WITH OR IMMEDIATELY FOLLOWING A MEAL. 90 tablet 3  . nitroGLYCERIN (NITROSTAT) 0.4 MG SL  tablet DISSOLVE 1 TABLET UNDER TONGUE EVERY 5 MINUTES FOR UP TO 3 DOSES AS NEEDED FOR CHEST PAIN. 25 tablet 3  . pantoprazole (PROTONIX) 40 MG tablet TAKE 1 TABLET EVERY DAY 90 tablet 1  . PROLENSA 0.07 % SOLN Place 1 drop into the right eye at bedtime.    Marland Kitchen tiZANidine (ZANAFLEX) 4 MG tablet Take 1-2 tablets by mouth daily.    Marland Kitchen ALPRAZolam (XANAX) 0.5 MG tablet Take one tablet daily as needed for anxiety. 30 tablet 2   No current facility-administered medications for this visit.    Medication Side Effects: none  Orders placed this visit:  No orders of the defined types were placed in this encounter.   Psychiatric Specialty Exam:  Review of Systems  Blood pressure 120/69, pulse 61, height 5\' 6"  (1.676 m), weight (!) 230 lb (104.3 kg).Body mass index is 37.12 kg/m.  General Appearance: Casual, Neat and Well Groomed  Eye Contact:  Good  Speech:  Clear and Coherent and Normal Rate  Volume:  Normal  Mood:  Anxious  Affect:  Appropriate and Congruent  Thought Process:  Coherent and Descriptions of Associations: Intact  Orientation:  Full (Time, Place, and Person)  Thought Content: Logical   Suicidal Thoughts:  No  Homicidal Thoughts:  No  Memory:  WNL  Judgement:  Good  Insight:  Good  Psychomotor Activity:  Normal  Concentration:  Concentration: Good  Recall:  Good  Fund of Knowledge: Good  Language: Good  Assets:  Communication Skills Desire for Improvement Financial Resources/Insurance Housing Intimacy Leisure Time Physical Health Resilience Social Support Talents/Skills Transportation Vocational/Educational  ADL's:  Intact  Cognition: WNL  Prognosis:  Good   Screenings:  GAD-7     Telemedicine from 11/29/2019 in [place]  Total GAD-7 Score 5    PHQ2-9     Telemedicine from 11/29/2019 in [place] Visit from 03/10/2017 in [place]  Office Visit from 10/21/2016 in [place]  PHQ-2 Total  Score 4 0 0  PHQ-9 Total Score 8 0 --      Receiving Psychotherapy: No   Treatment Plan/Recommendations:   Plan:  PDMP reviewed  1. Prozac 60mg  daily - increased a few month ago 2. Add Xanax 0.5mg  daily as needed for anxiety/panic attacks  Read and reviewed note with patient for accuracy.   RTC 4 weeks  Patient advised to contact office with any questions, adverse effects, or acute worsening in signs and symptoms.  Discussed potential benefits, risk, and side effects of benzodiazepines to include potential risk of tolerance and dependence, as well as possible drowsiness.  Advised patient not to drive if experiencing drowsiness and to take lowest possible effective dose to minimize risk of dependence and tolerance.      Aloha Gell, NP

## 2020-03-02 ENCOUNTER — Telehealth: Payer: Self-pay | Admitting: Adult Health

## 2020-03-02 NOTE — Telephone Encounter (Signed)
Pt called and left message that [facility] is out of Alprazolam for a few days. Pt wants to know if she can just get it called in at [facility].

## 2020-03-05 NOTE — Telephone Encounter (Signed)
Left message with patient to call back with clarification

## 2020-03-05 NOTE — Telephone Encounter (Signed)
Has this been resolved? It will take up to a week for [facility].

## 2020-03-06 NOTE — Telephone Encounter (Signed)
Pt called back and would like Rx to go to [facility]. Pt understands it will take a while to receive from [facility] and is ok with that.

## 2020-03-09 ENCOUNTER — Other Ambulatory Visit: Payer: Self-pay

## 2020-03-09 DIAGNOSIS — F411 Generalized anxiety disorder: Secondary | ICD-10-CM

## 2020-03-09 MED ORDER — ALPRAZOLAM 0.5 MG PO TABS: ORAL_TABLET | ORAL | 2 refills | Status: DC

## 2020-03-09 NOTE — Telephone Encounter (Signed)
Noted pended for Barnett Applebaum to send to [facility] per request

## 2020-03-29 DIAGNOSIS — M545 Low back pain: Secondary | ICD-10-CM | POA: Diagnosis not present

## 2020-03-29 DIAGNOSIS — M797 Fibromyalgia: Secondary | ICD-10-CM | POA: Diagnosis not present

## 2020-03-29 DIAGNOSIS — Z79899 Other long term (current) drug therapy: Secondary | ICD-10-CM | POA: Diagnosis not present

## 2020-03-29 DIAGNOSIS — M542 Cervicalgia: Secondary | ICD-10-CM | POA: Diagnosis not present

## 2020-04-12 ENCOUNTER — Encounter: Payer: Self-pay | Admitting: Adult Health

## 2020-04-12 ENCOUNTER — Telehealth (INDEPENDENT_AMBULATORY_CARE_PROVIDER_SITE_OTHER): Payer: Medicare HMO | Admitting: Adult Health

## 2020-04-12 DIAGNOSIS — F411 Generalized anxiety disorder: Secondary | ICD-10-CM | POA: Diagnosis not present

## 2020-04-12 DIAGNOSIS — F410 Panic disorder [episodic paroxysmal anxiety] without agoraphobia: Secondary | ICD-10-CM | POA: Diagnosis not present

## 2020-04-12 DIAGNOSIS — F331 Major depressive disorder, recurrent, moderate: Secondary | ICD-10-CM | POA: Diagnosis not present

## 2020-04-12 NOTE — Progress Notes (Signed)
[name] [id number] October 14, 1955 64 y.o.  Virtual Visit via Video Note  I connected with pt @ on 04/12/20 at 11:40 AM EDT by a video enabled telemedicine application and verified that I am speaking with the correct person using two identifiers.   I discussed the limitations of evaluation and management by telemedicine and the availability of in person appointments. The patient expressed understanding and agreed to proceed.  I discussed the assessment and treatment plan with the patient. The patient was provided an opportunity to ask questions and all were answered. The patient agreed with the plan and demonstrated an understanding of the instructions.   The patient was advised to call back or seek an in-person evaluation if the symptoms worsen or if the condition fails to improve as anticipated.  I provided 20 minutes of non-face-to-face time during this encounter.  The patient was located at home.  The provider was located at [place].   Aloha Gell, NP   Subjective:   Patient ID:  [name] is a 64 y.o. (DOB 1956/04/20) female.  Chief Complaint: No chief complaint on file.   HPI SHAYE ELLING presents for follow-up of GAD, MDD, and panic attacks.    Describes mood today as "ok". Pleasant. Mood symptoms - reports decreased depression, anxiety, and irritability. Still feels depressed at times, but thinks it's situational. Anxiety "much better" since restarting the Xanax. Has not used any nitro since restarting the Xanax. Denies panic attacks.Denies chest pains. Has not experienced any chest pain. Looking forward to cooler weather. Plans to ride motorcycle today. Stable interest and motivation. Taking medications as prescribed.  Energy levels depend on "pain" - Fibromyalgia. Active, does not have a regular exercise routine.  Enjoys some usual interests and activities. Married - 2nd marriage. Lives with husband and his 2 sons 24 and 96. She has 3  grown sons, 8 grandchildren and 2 great grandchildren.Planning to go to [place] next month to visit family. Spending time with family. Appetite adequate. Weight fluctuates - 230 pounds.  Sleeps well most nights. Averages 8 hours. Focus and concentration difficulties at times. Completing tasks. Managing aspects of household. Retired - worked as a Emergency planning/management officer for 40 years.  Denies SI or HI. Denies AH or VH.  Previous medication trials: Zoloft, Cymbalta  Review of Systems:  Review of Systems  Musculoskeletal: Negative for gait problem.  Neurological: Negative for tremors.  Psychiatric/Behavioral:       Please refer to HPI    Medications: I have reviewed the patient's current medications.  Current Outpatient Medications  Medication Sig Dispense Refill  . ALPRAZolam (XANAX) 0.5 MG tablet Take one tablet daily as needed for anxiety. 30 tablet 2  . amitriptyline (ELAVIL) 25 MG tablet Take 1 tablet by mouth at bedtime.    Marland Kitchen atorvastatin (LIPITOR) 40 MG tablet TAKE 1 TABLET EVERY DAY (STOP THE 80MG ) 90 tablet 1  . Biotin 10000 MCG TABS Take 1 tablet by mouth daily.    . clopidogrel (PLAVIX) 75 MG tablet Take 1 tablet (75 mg total) by mouth daily. 90 tablet 1  . Cyanocobalamin (VITAMIN B-12 PO) Take 1 tablet by mouth daily.    . DUREZOL 0.05 % EMUL Place 1 drop into the right eye 3 (three) times daily.    Marland Kitchen FLUoxetine (PROZAC) 20 MG capsule Take 3 capsule po daily 270 capsule 2  . HYDROcodone-acetaminophen (NORCO/VICODIN) 5-325 MG tablet Take 1 tablet by mouth every 6 (six) hours as needed for moderate pain.    Marland Kitchen  isosorbide mononitrate (IMDUR) 30 MG 24 hr tablet TAKE 1/2 TABLET ONE TIME DAILY 45 tablet 1  . levothyroxine (SYNTHROID) 125 MCG tablet TAKE 1/2 TABLET ON SUNDAYS AND ONE TABLET ALL OTHER DAYS 90 tablet 1  . lidocaine (LIDODERM) 5 % Place 1 patch onto the skin daily as needed.    . metoprolol succinate (TOPROL-XL) 50 MG 24 hr tablet TAKE 1 TABLET (50 MG TOTAL) BY MOUTH DAILY. TAKE  WITH OR IMMEDIATELY FOLLOWING A MEAL. 90 tablet 3  . nitroGLYCERIN (NITROSTAT) 0.4 MG SL tablet DISSOLVE 1 TABLET UNDER TONGUE EVERY 5 MINUTES FOR UP TO 3 DOSES AS NEEDED FOR CHEST PAIN. 25 tablet 3  . pantoprazole (PROTONIX) 40 MG tablet TAKE 1 TABLET EVERY DAY 90 tablet 1  . PROLENSA 0.07 % SOLN Place 1 drop into the right eye at bedtime.    Marland Kitchen tiZANidine (ZANAFLEX) 4 MG tablet Take 1-2 tablets by mouth daily.     No current facility-administered medications for this visit.    Medication Side Effects: None  Allergies:  Allergies  Allergen Reactions  . Albuterol Other (See Comments)    Tremors  . Cymbalta [Duloxetine Hcl] Other (See Comments)    Felt bad  . Lyrica [Pregabalin] Other (See Comments)    Retained fluids    Past Medical History:  Diagnosis Date  . Anxiety   . Arthritis   . Asthma   . Coronary atherosclerosis of native coronary artery    a. s/p DES to LAD and D1 in 2005 b. cath in 11/2015 showing patent stents with mild disease along RCA and LCx  . Depression    Prior suicide attempt  . Essential hypertension   . Fibromyalgia   . GERD (gastroesophageal reflux disease)   . History of blood transfusion 2005  . History of kidney stones   . Hyperlipidemia   . Hypothyroidism   . Myocardial infarction ([place]) 2017  . NSTEMI (non-ST elevated myocardial infarction) ([place]) 2005  . Palpitations    PVC's have been noted  . Sleep apnea     Family History  Problem Relation Age of Onset  . Coronary artery disease Father        MI age 1  . Heart failure Father   . Heart failure Mother   . Anesthesia problems Neg Hx   . Hypotension Neg Hx   . Malignant hyperthermia Neg Hx   . Pseudochol deficiency Neg Hx     Social History   Socioeconomic History  . Marital status: Married    Spouse name: Not on file  . Number of children: Not on file  . Years of education: Not on file  . Highest education level: Not on file  Occupational History  . Not on file  Tobacco Use   . Smoking status: Current Every Day Smoker    Packs/day: 0.33    Years: 49.00    Pack years: 16.17    Types: Cigarettes    Start date: 05/31/1969  . Smokeless tobacco: Never Used  . Tobacco comment: 1 pack per week & vapes also   Vaping Use  . Vaping Use: Every day  Substance and Sexual Activity  . Alcohol use: No    Alcohol/week: 0.0 standard drinks  . Drug use: No  . Sexual activity: Yes    Birth control/protection: Post-menopausal  Other Topics Concern  . Not on file  Social History Narrative  . Not on file   Social Determinants of Health   Financial Resource Strain:   .  Difficulty of Paying Living Expenses: Not on file  Food Insecurity:   . Worried About Charity fundraiser in the Last Year: Not on file  . Ran Out of Food in the Last Year: Not on file  Transportation Needs:   . Lack of Transportation (Medical): Not on file  . Lack of Transportation (Non-Medical): Not on file  Physical Activity:   . Days of Exercise per Week: Not on file  . Minutes of Exercise per Session: Not on file  Stress:   . Feeling of Stress : Not on file  Social Connections:   . Frequency of Communication with Friends and Family: Not on file  . Frequency of Social Gatherings with Friends and Family: Not on file  . Attends Religious Services: Not on file  . Active Member of Clubs or Organizations: Not on file  . Attends Archivist Meetings: Not on file  . Marital Status: Not on file  Intimate Partner Violence:   . Fear of Current or Ex-Partner: Not on file  . Emotionally Abused: Not on file  . Physically Abused: Not on file  . Sexually Abused: Not on file    Past Medical History, Surgical history, Social history, and Family history were reviewed and updated as appropriate.   Please see review of systems for further details on the patient's review from today.   Objective:   Physical Exam:  There were no vitals taken for this visit.  Physical Exam Constitutional:       General: She is not in acute distress. Musculoskeletal:        General: No deformity.  Neurological:     Mental Status: She is alert and oriented to person, place, and time.     Coordination: Coordination normal.  Psychiatric:        Attention and Perception: Attention and perception normal. She does not perceive auditory or visual hallucinations.        Mood and Affect: Mood normal. Mood is not anxious or depressed. Affect is not labile, blunt, angry or inappropriate.        Speech: Speech normal.        Behavior: Behavior normal.        Thought Content: Thought content normal. Thought content is not paranoid or delusional. Thought content does not include homicidal or suicidal ideation. Thought content does not include homicidal or suicidal plan.        Cognition and Memory: Cognition and memory normal.        Judgment: Judgment normal.     Comments: Insight intact     Lab Review:     Component Value Date/Time   NA 143 10/17/2019 1150   K 3.9 10/17/2019 1150   CL 106 10/17/2019 1150   CO2 23 10/17/2019 1150   GLUCOSE 111 (H) 10/17/2019 1150   GLUCOSE 132 (H) 10/22/2017 0634   BUN 10 10/17/2019 1150   CREATININE 0.93 10/17/2019 1150   CREATININE 1.01 11/16/2013 0752   CALCIUM 9.0 10/17/2019 1150   PROT 6.4 10/17/2019 1150   ALBUMIN 3.3 (L) 10/17/2019 1150   AST 25 10/17/2019 1150   ALT 24 10/17/2019 1150   ALKPHOS 142 (H) 10/17/2019 1150   BILITOT 0.3 10/17/2019 1150   GFRNONAA 65 10/17/2019 1150   GFRAA 75 10/17/2019 1150       Component Value Date/Time   WBC 8.1 10/22/2017 0634   RBC 3.86 (L) 10/22/2017 0634   HGB 11.2 (L) 10/22/2017 0634   HGB 11.9 06/19/2016  1028   HCT 35.8 (L) 10/22/2017 0634   HCT 35.6 06/19/2016 1028   PLT 291 10/22/2017 0634   PLT 303 06/19/2016 1028   MCV 92.7 10/22/2017 0634   MCV 89 06/19/2016 1028   MCH 29.0 10/22/2017 0634   MCHC 31.3 10/22/2017 0634   RDW 15.5 10/22/2017 0634   RDW 14.3 06/19/2016 1028   LYMPHSABS 2.2 10/21/2017  0704   LYMPHSABS 2.6 06/19/2016 1028   MONOABS 0.7 10/21/2017 0704   EOSABS 0.3 10/21/2017 0704   EOSABS 0.6 (H) 06/19/2016 1028   BASOSABS 0.0 10/21/2017 0704   BASOSABS 0.1 06/19/2016 1028    No results found for: POCLITH, LITHIUM   No results found for: PHENYTOIN, PHENOBARB, VALPROATE, CBMZ   .res Assessment: Plan:    Plan:  PDMP reviewed  1. Prozac 60mg  daily - increased a few month ago 2. Xanax 0.5mg  daily as needed for anxiety/panic attacks  Read and reviewed note with patient for accuracy.   RTC 2 months  Patient advised to contact office with any questions, adverse effects, or acute worsening in signs and symptoms.  Discussed potential benefits, risk, and side effects of benzodiazepines to include potential risk of tolerance and dependence, as well as possible drowsiness.  Advised patient not to drive if experiencing drowsiness and to take lowest possible effective dose to minimize risk of dependence and tolerance.     Diagnoses and all orders for this visit:  Panic attacks  Major depressive disorder, recurrent episode, moderate ([place])  Generalized anxiety disorder     Please see After Visit Summary for patient specific instructions.  Future Appointments  Date Time Provider [place]  04/20/2020 11:40 AM Satira Sark, MD [facility]    No orders of the defined types were placed in this encounter.     -------------------------------

## 2020-04-19 NOTE — Progress Notes (Signed)
Cardiology Office Note  Date: 04/20/2020   ID: [name], DOB 1955-11-13, MRN [id number]  PCP:  Kathyrn Drown, MD  Cardiologist:  Rozann Lesches, MD Electrophysiologist:  None   Chief Complaint  Patient presents with  . Cardiac follow-up    History of Present Illness: [name] is a 64 y.o. female last assessed via encounter in March.  She presents for a routine visit.  She tells me that she has been doing well, no recent angina symptoms or nitroglycerin use.  She is following with a behavioral health specialist in [place] for management of anxiety and feels like this has also helped her sense of wellbeing.  She has not been riding her motorcycle during the summer in light of the high heat and humidity.  I reviewed her medications which are outlined below and stable from a cardiac perspective.  I personally reviewed her ECG today which shows sinus rhythm with small septal Q waves.  Her last LDL was 56 and she is tolerating Lipitor.  Past Medical History:  Diagnosis Date  . Anxiety   . Arthritis   . Asthma   . Coronary atherosclerosis of native coronary artery    a. s/p DES to LAD and D1 in 2005 b. cath in 11/2015 showing patent stents with mild disease along RCA and LCx  . Depression    Prior suicide attempt  . Essential hypertension   . Fibromyalgia   . GERD (gastroesophageal reflux disease)   . History of blood transfusion 2005  . History of kidney stones   . Hyperlipidemia   . Hypothyroidism   . Myocardial infarction ([place]) 2017  . NSTEMI (non-ST elevated myocardial infarction) ([place]) 2005  . Palpitations    PVC's have been noted  . Sleep apnea     Past Surgical History:  Procedure Laterality Date  . CARDIAC CATHETERIZATION  ~ 2009/2010  . CARDIAC CATHETERIZATION N/A 11/21/2015   Procedure: Left Heart Cath and Coronary Angiography;  Surgeon: Burnell Blanks, MD;  Location: [facility];  Service: Cardiovascular;  Laterality: N/A;   . CARDIAC CATHETERIZATION  10/21/2017  . CARPAL TUNNEL RELEASE Bilateral 2007- 2008  . CORONARY ANGIOPLASTY WITH STENT PLACEMENT  2005  . FOOT BONE EXCISION     Sesmoid bone left foot-repair of fracture  . LEFT HEART CATH AND CORONARY ANGIOGRAPHY N/A 10/21/2017   Procedure: LEFT HEART CATH AND CORONARY ANGIOGRAPHY;  Surgeon: Wellington Hampshire, MD;  Location: [facility];  Service: Cardiovascular;  Laterality: N/A;  . TUBAL LIGATION      Current Outpatient Medications  Medication Sig Dispense Refill  . ALPRAZolam (XANAX) 0.5 MG tablet Take one tablet daily as needed for anxiety. 30 tablet 2  . amitriptyline (ELAVIL) 25 MG tablet Take 1 tablet by mouth at bedtime.    Marland Kitchen atorvastatin (LIPITOR) 40 MG tablet TAKE 1 TABLET EVERY DAY (STOP THE 80MG ) 90 tablet 1  . Biotin 10000 MCG TABS Take 1 tablet by mouth daily.    . clopidogrel (PLAVIX) 75 MG tablet Take 1 tablet (75 mg total) by mouth daily. 90 tablet 1  . Cyanocobalamin (VITAMIN B-12 PO) Take 1 tablet by mouth daily.    Marland Kitchen FLUoxetine (PROZAC) 20 MG capsule Take 3 capsule po daily 270 capsule 2  . HYDROcodone-acetaminophen (NORCO/VICODIN) 5-325 MG tablet Take 1 tablet by mouth every 6 (six) hours as needed for moderate pain.    . isosorbide mononitrate (IMDUR) 30 MG 24 hr tablet TAKE 1/2  TABLET ONE TIME DAILY 45 tablet 1  . levothyroxine (SYNTHROID) 125 MCG tablet TAKE 1/2 TABLET ON SUNDAYS AND ONE TABLET ALL OTHER DAYS 90 tablet 1  . lidocaine (LIDODERM) 5 % Place 1 patch onto the skin daily as needed.    . metoprolol succinate (TOPROL-XL) 50 MG 24 hr tablet TAKE 1 TABLET (50 MG TOTAL) BY MOUTH DAILY. TAKE WITH OR IMMEDIATELY FOLLOWING A MEAL. 90 tablet 3  . nitroGLYCERIN (NITROSTAT) 0.4 MG SL tablet DISSOLVE 1 TABLET UNDER TONGUE EVERY 5 MINUTES FOR UP TO 3 DOSES AS NEEDED FOR CHEST PAIN. 25 tablet 3  . pantoprazole (PROTONIX) 40 MG tablet TAKE 1 TABLET EVERY DAY 90 tablet 1  . PROLENSA 0.07 % SOLN Place 1 drop into the right eye at  bedtime.    Marland Kitchen tiZANidine (ZANAFLEX) 4 MG tablet Take 1-2 tablets by mouth daily.     No current facility-administered medications for this visit.   Allergies:  Albuterol, Cymbalta [duloxetine hcl], and Lyrica [pregabalin]   ROS:  No palpitations or syncope.  Physical Exam: VS:  BP 118/68   Pulse 63   Ht 5\' 6"  (1.676 m)   Wt 236 lb (107 kg)   SpO2 95%   BMI 38.09 kg/m , BMI Body mass index is 38.09 kg/m.  Wt Readings from Last 3 Encounters:  04/20/20 236 lb (107 kg)  10/18/19 240 lb (108.9 kg)  04/01/19 233 lb (105.7 kg)    General: Patient appears comfortable at rest. HEENT: Conjunctiva and lids normal, wearing a mask. Neck: Supple, no elevated JVP or carotid bruits, no thyromegaly. Lungs: Clear to auscultation, nonlabored breathing at rest. Cardiac: Regular rate and rhythm, no S3 or significant systolic murmur. Extremities: No pitting edema, distal pulses 2+.  ECG:  An ECG dated 10/21/2017 was personally reviewed today and demonstrated:  Sinus bradycardia with poor R wave progression.  Recent Labwork: 10/17/2019: ALT 24; AST 25; BUN 10; Creatinine, Ser 0.93; Potassium 3.9; Sodium 143; TSH 0.580     Component Value Date/Time   CHOL 136 10/17/2019 1150   TRIG 97 10/17/2019 1150   HDL 62 10/17/2019 1150   CHOLHDL 2.2 10/17/2019 1150   CHOLHDL 3.6 11/16/2013 0752   VLDL 24 11/16/2013 0752   LDLCALC 56 10/17/2019 1150    Other Studies Reviewed Today:  Echocardiogram6/20/2019: Study Conclusions  - Left ventricle: The cavity size was normal. Wall thickness was increased in a pattern of mild LVH. Systolic function was normal. The estimated ejection fraction was in the range of 55% to 60%. Diastolic function is abnormal, indeterminant grade. - Aortic valve: Valve area (VTI): 2.73 cm^2. Valve area (Vmax): 2.98 cm^2. Valve area (Vmean): 2.67 cm^2. - Technically adequate study.  Cardiac catheterization 10/21/2017:  Prox RCA to Dist RCA lesion is 20% stenosed.   Previously placed Ost 1st Diag stent (unknown type) is widely patent.  Previously placed Prox LAD stent (unknown type) is widely patent.  LV end diastolic pressure is moderately elevated.  1. Patent LAD/diagonal stents with no evidence of obstructive coronary artery disease. 2. Iatrogenic myocardial contusion and microperforation caused by left ventricular angiography with manual injection. It appears that the catheter tip was into the myocardium. The patiet had no symptoms throughout this with no arrhythmia, instability or hypotension.  Assessment and Plan:  1.  CAD status post DES to the LAD and first diagonal in 2005.  Stents were patent at follow-up angiography in 2019 and she continues to do well without worsening angina symptoms on medical therapy.  ECG reviewed.  Continue Plavix, Lipitor, Imdur, and Toprol-XL.  2.  Mixed hyperlipidemia tolerating Lipitor.  LDL 56.  Medication Adjustments/Labs and Tests Ordered: Current medicines are reviewed at length with the patient today.  Concerns regarding medicines are outlined above.   Tests Ordered: Orders Placed This Encounter  Procedures  . EKG 12-Lead    Medication Changes: No orders of the defined types were placed in this encounter.   Disposition:  Follow up 6 months in the [place] office.  Signed, Satira Sark, MD, [facility] 04/20/2020 11:36 AM    [facility], [place], [place] [address] Phone: [phone]; Fax: [phone]

## 2020-04-20 ENCOUNTER — Ambulatory Visit (INDEPENDENT_AMBULATORY_CARE_PROVIDER_SITE_OTHER): Payer: Medicare HMO | Admitting: Cardiology

## 2020-04-20 ENCOUNTER — Encounter: Payer: Self-pay | Admitting: Cardiology

## 2020-04-20 VITALS — BP 118/68 | HR 63 | Ht 66.0 in | Wt 236.0 lb

## 2020-04-20 DIAGNOSIS — I25119 Atherosclerotic heart disease of native coronary artery with unspecified angina pectoris: Secondary | ICD-10-CM | POA: Diagnosis not present

## 2020-04-20 DIAGNOSIS — E782 Mixed hyperlipidemia: Secondary | ICD-10-CM | POA: Diagnosis not present

## 2020-04-20 NOTE — Patient Instructions (Signed)

## 2020-05-05 ENCOUNTER — Telehealth: Payer: Self-pay

## 2020-05-05 NOTE — Telephone Encounter (Signed)
Prior authorization submitted and approved for ALPRAZOLAM 0.5 MG effective 08/05/2019-08/03/2020 with [facility]

## 2020-05-24 DIAGNOSIS — M542 Cervicalgia: Secondary | ICD-10-CM | POA: Diagnosis not present

## 2020-05-24 DIAGNOSIS — M545 Low back pain, unspecified: Secondary | ICD-10-CM | POA: Diagnosis not present

## 2020-05-24 DIAGNOSIS — F419 Anxiety disorder, unspecified: Secondary | ICD-10-CM | POA: Diagnosis not present

## 2020-05-24 DIAGNOSIS — Z79899 Other long term (current) drug therapy: Secondary | ICD-10-CM | POA: Diagnosis not present

## 2020-05-24 DIAGNOSIS — G8929 Other chronic pain: Secondary | ICD-10-CM | POA: Diagnosis not present

## 2020-06-15 ENCOUNTER — Ambulatory Visit (INDEPENDENT_AMBULATORY_CARE_PROVIDER_SITE_OTHER): Payer: Medicare HMO | Admitting: Family Medicine

## 2020-06-15 ENCOUNTER — Encounter: Payer: Self-pay | Admitting: Family Medicine

## 2020-06-15 ENCOUNTER — Other Ambulatory Visit: Payer: Self-pay | Admitting: Family Medicine

## 2020-06-15 VITALS — HR 66 | Temp 97.0°F | Resp 16

## 2020-06-15 DIAGNOSIS — B373 Candidiasis of vulva and vagina: Secondary | ICD-10-CM | POA: Diagnosis not present

## 2020-06-15 DIAGNOSIS — J40 Bronchitis, not specified as acute or chronic: Secondary | ICD-10-CM

## 2020-06-15 DIAGNOSIS — R059 Cough, unspecified: Secondary | ICD-10-CM

## 2020-06-15 DIAGNOSIS — B3731 Acute candidiasis of vulva and vagina: Secondary | ICD-10-CM

## 2020-06-15 DIAGNOSIS — N611 Abscess of the breast and nipple: Secondary | ICD-10-CM

## 2020-06-15 MED ORDER — LEVALBUTEROL TARTRATE 45 MCG/ACT IN AERO: 2.0000 | INHALATION_SPRAY | RESPIRATORY_TRACT | 1 refills | Status: DC | PRN

## 2020-06-15 MED ORDER — FLUCONAZOLE 150 MG PO TABS: 150.0000 mg | ORAL_TABLET | Freq: Once | ORAL | 0 refills | Status: AC

## 2020-06-15 MED ORDER — DOXYCYCLINE HYCLATE 100 MG PO TABS: 100.0000 mg | ORAL_TABLET | Freq: Two times a day (BID) | ORAL | 0 refills | Status: DC

## 2020-06-15 NOTE — Progress Notes (Signed)
Patient ID: [name], female    DOB: 1956/04/11, 64 y.o.   MRN: [id number]   Chief Complaint  Patient presents with  . Cough    congestion for 10 days   Subjective:  CC: cough x 10 days  Presents today for an outside visit with a complaint of congestion x10 days.  She reports "sick for 10 days "with a nonproductive cough and blowing her nose.  Pertinent negatives include no fever, no chills, no headache, no chest pain, no shortness of breath, no ear pain, no abdominal pain, no nausea vomiting or diarrhea, no urinary symptoms.  Has tried NyQuil/DayQuil for severe cough, has helped to some.  She has not been tested for Covid, did not take the Covid vaccine.  She is a smoker.  She also reports that she is concerned with a bump on her right breast at first she thought it was an insect bite and is about the size of a quarter.  Reports that pus was expressed from it this morning.    Medical History [place] has a past medical history of Anxiety, Arthritis, Asthma, Coronary atherosclerosis of native coronary artery, Depression, Essential hypertension, Fibromyalgia, GERD (gastroesophageal reflux disease), History of blood transfusion (2005), History of kidney stones, Hyperlipidemia, Hypothyroidism, Myocardial infarction ([place]) (2017), NSTEMI (non-ST elevated myocardial infarction) ([place]) (2005), Palpitations, and Sleep apnea.   Outpatient Encounter Medications as of 06/15/2020  Medication Sig  . ALPRAZolam (XANAX) 0.5 MG tablet Take one tablet daily as needed for anxiety.  Marland Kitchen amitriptyline (ELAVIL) 25 MG tablet Take 1 tablet by mouth at bedtime.  Marland Kitchen atorvastatin (LIPITOR) 40 MG tablet TAKE 1 TABLET EVERY DAY (STOP THE 80MG )  . Biotin 10000 MCG TABS Take 1 tablet by mouth daily.  . clopidogrel (PLAVIX) 75 MG tablet Take 1 tablet (75 mg total) by mouth daily.  . Cyanocobalamin (VITAMIN B-12 PO) Take 1 tablet by mouth daily.  Marland Kitchen doxycycline (VIBRA-TABS) 100 MG tablet Take 1 tablet (100 mg total)  by mouth 2 (two) times daily.  . fluconazole (DIFLUCAN) 150 MG tablet Take 1 tablet (150 mg total) by mouth once for 1 dose.  Marland Kitchen FLUoxetine (PROZAC) 20 MG capsule Take 3 capsule po daily  . HYDROcodone-acetaminophen (NORCO/VICODIN) 5-325 MG tablet Take 1 tablet by mouth every 6 (six) hours as needed for moderate pain.  . isosorbide mononitrate (IMDUR) 30 MG 24 hr tablet TAKE 1/2 TABLET ONE TIME DAILY  . levalbuterol (XOPENEX HFA) 45 MCG/ACT inhaler Inhale 2 puffs into the lungs every 4 (four) hours as needed for wheezing.  Marland Kitchen levothyroxine (SYNTHROID) 125 MCG tablet TAKE 1/2 TABLET ON SUNDAYS AND ONE TABLET ALL OTHER DAYS  . lidocaine (LIDODERM) 5 % Place 1 patch onto the skin daily as needed.  . metoprolol succinate (TOPROL-XL) 50 MG 24 hr tablet TAKE 1 TABLET (50 MG TOTAL) BY MOUTH DAILY. TAKE WITH OR IMMEDIATELY FOLLOWING A MEAL.  . nitroGLYCERIN (NITROSTAT) 0.4 MG SL tablet DISSOLVE 1 TABLET UNDER TONGUE EVERY 5 MINUTES FOR UP TO 3 DOSES AS NEEDED FOR CHEST PAIN.  Marland Kitchen pantoprazole (PROTONIX) 40 MG tablet TAKE 1 TABLET EVERY DAY  . PROLENSA 0.07 % SOLN Place 1 drop into the right eye at bedtime.  Marland Kitchen tiZANidine (ZANAFLEX) 4 MG tablet Take 1-2 tablets by mouth daily.   No facility-administered encounter medications on file as of 06/15/2020.     Review of Systems  Constitutional: Negative for chills and fever.  HENT: Positive for congestion and rhinorrhea. Negative for sinus pressure, sinus pain  and sore throat.   Respiratory: Positive for cough. Negative for shortness of breath.   Cardiovascular: Negative for chest pain.  Gastrointestinal: Negative for abdominal pain.     Vitals Pulse 66   Temp (!) 97 F (36.1 C)   Resp 16   SpO2 94%   Objective:   Physical Exam Vitals and nursing note reviewed.  Constitutional:      General: She is not in acute distress.    Appearance: Normal appearance. She is not ill-appearing.  HENT:     Right Ear: Tympanic membrane normal.     Left Ear:  Tympanic membrane normal.     Nose: Rhinorrhea present.     Mouth/Throat:     Mouth: Mucous membranes are moist.     Pharynx: Oropharynx is clear. No posterior oropharyngeal erythema.  Cardiovascular:     Rate and Rhythm: Normal rate and regular rhythm.     Heart sounds: Normal heart sounds.  Pulmonary:     Effort: Pulmonary effort is normal.     Breath sounds: Wheezing present.     Comments: Smoker. Chest:     Breasts:        Right: Skin change present.      Comments: Quarter size area on the right breast with 2 areas that are open with pus draining.  This visit was done outside, this examination was extremely brief. Skin:    General: Skin is warm and dry.  Neurological:     Mental Status: She is alert and oriented to person, place, and time.  Psychiatric:        Mood and Affect: Mood normal.        Behavior: Behavior normal.        Thought Content: Thought content normal.        Judgment: Judgment normal.      Assessment and Plan   1. Breast abscess of female - doxycycline (VIBRA-TABS) 100 MG tablet; Take 1 tablet (100 mg total) by mouth 2 (two) times daily.  Dispense: 20 tablet; Refill: 0  2. Bronchitis with wheezing - levalbuterol (XOPENEX HFA) 45 MCG/ACT inhaler; Inhale 2 puffs into the lungs every 4 (four) hours as needed for wheezing.  Dispense: 1 each; Refill: 1  3. Yeast infection of the vagina - fluconazole (DIFLUCAN) 150 MG tablet; Take 1 tablet (150 mg total) by mouth once for 1 dose.  Dispense: 1 tablet; Refill: 0  4. Cough - Novel Coronavirus, NAA ([facility])   The congestion likely is viral in nature, however I will treat the breast abscess with doxycycline x10 days.  She reports that she gets a yeast infection with antibiotic therapy Diflucan sent.  She is a smoker, some scattered wheezing was heard during auscultation.  Reports albuterol makes her jittery, Xopenex ordered.  Covid test performed today, results pending, will notify once they are  available.  Agrees with plan of care discussed today. Understands warning signs to seek further care: Chest pain, shortness of breath, any significant changes in health status. Understands to follow-up in approximately 2 weeks to reassess the area on the right breast--appointment made before she left today.  She will complete her antibiotics and follow-up at that time.  Pecolia Ades, FNP-C

## 2020-06-17 LAB — SARS-COV-2, NAA 2 DAY TAT

## 2020-06-17 LAB — NOVEL CORONAVIRUS, NAA: SARS-CoV-2, NAA: NOT DETECTED

## 2020-06-17 LAB — SPECIMEN STATUS REPORT

## 2020-06-25 ENCOUNTER — Other Ambulatory Visit: Payer: Self-pay | Admitting: Family Medicine

## 2020-06-26 ENCOUNTER — Other Ambulatory Visit: Payer: Self-pay

## 2020-06-26 ENCOUNTER — Telehealth: Payer: Self-pay

## 2020-06-26 ENCOUNTER — Encounter: Payer: Self-pay | Admitting: Family Medicine

## 2020-06-26 ENCOUNTER — Ambulatory Visit (INDEPENDENT_AMBULATORY_CARE_PROVIDER_SITE_OTHER): Payer: Medicare HMO | Admitting: Family Medicine

## 2020-06-26 VITALS — BP 136/72 | HR 94 | Temp 96.2°F | Wt 235.6 lb

## 2020-06-26 DIAGNOSIS — K644 Residual hemorrhoidal skin tags: Secondary | ICD-10-CM | POA: Diagnosis not present

## 2020-06-26 DIAGNOSIS — N611 Abscess of the breast and nipple: Secondary | ICD-10-CM

## 2020-06-26 DIAGNOSIS — J40 Bronchitis, not specified as acute or chronic: Secondary | ICD-10-CM | POA: Diagnosis not present

## 2020-06-26 DIAGNOSIS — Z1231 Encounter for screening mammogram for malignant neoplasm of breast: Secondary | ICD-10-CM | POA: Diagnosis not present

## 2020-06-26 DIAGNOSIS — Z23 Encounter for immunization: Secondary | ICD-10-CM | POA: Diagnosis not present

## 2020-06-26 MED ORDER — ALBUTEROL SULFATE HFA 108 (90 BASE) MCG/ACT IN AERS: 2.0000 | INHALATION_SPRAY | Freq: Four times a day (QID) | RESPIRATORY_TRACT | 1 refills | Status: AC | PRN

## 2020-06-26 NOTE — Telephone Encounter (Signed)
Last labs 10/17/19: Lipid, Liver, Met 7, HgbA1c, TSH, Vit B12

## 2020-06-26 NOTE — Telephone Encounter (Signed)
Patient has physical on 12/14 needing labs done

## 2020-06-26 NOTE — Patient Instructions (Signed)
Coping with Quitting Smoking  Quitting smoking is a physical and mental challenge. You will face cravings, withdrawal symptoms, and temptation. Before quitting, work with your health care provider to make a plan that can help you cope. Preparation can help you quit and keep you from giving in. How can I cope with cravings? Cravings usually last for 5-10 minutes. If you get through it, the craving will pass. Consider taking the following actions to help you cope with cravings:  Keep your mouth busy: ? Chew sugar-free gum. ? Suck on hard candies or a straw. ? Brush your teeth.  Keep your hands and body busy: ? Immediately change to a different activity when you feel a craving. ? Squeeze or play with a ball. ? Do an activity or a hobby, like making bead jewelry, practicing needlepoint, or working with wood. ? Mix up your normal routine. ? Take a short exercise break. Go for a quick walk or run up and down stairs. ? Spend time in public places where smoking is not allowed.  Focus on doing something kind or helpful for someone else.  Call a friend or family member to talk during a craving.  Join a support group.  Call a quit line, such as 1-800-QUIT-NOW.  Talk with your health care provider about medicines that might help you cope with cravings and make quitting easier for you. How can I deal with withdrawal symptoms? Your body may experience negative effects as it tries to get used to not having nicotine in the system. These effects are called withdrawal symptoms. They may include:  Feeling hungrier than normal.  Trouble concentrating.  Irritability.  Trouble sleeping.  Feeling depressed.  Restlessness and agitation.  Craving a cigarette. To manage withdrawal symptoms:  Avoid places, people, and activities that trigger your cravings.  Remember why you want to quit.  Get plenty of sleep.  Avoid coffee and other caffeinated drinks. These may worsen some of your symptoms.  How can I handle social situations? Social situations can be difficult when you are quitting smoking, especially in the first few weeks. To manage this, you can:  Avoid parties, bars, and other social situations where people might be smoking.  Avoid alcohol.  Leave right away if you have the urge to smoke.  Explain to your family and friends that you are quitting smoking. Ask for understanding and support.  Plan activities with friends or family where smoking is not an option. What are some ways I can cope with stress? Wanting to smoke may cause stress, and stress can make you want to smoke. Find ways to manage your stress. Relaxation techniques can help. For example:  Breathe slowly and deeply, in through your nose and out through your mouth.  Listen to soothing, relaxing music.  Talk with a family member or friend about your stress.  Light a candle.  Soak in a bath or take a shower.  Think about a peaceful place. What are some ways I can prevent weight gain? Be aware that many people gain weight after they quit smoking. However, not everyone does. To keep from gaining weight, have a plan in place before you quit and stick to the plan after you quit. Your plan should include:  Having healthy snacks. When you have a craving, it may help to: ? Eat plain popcorn, crunchy carrots, celery, or other cut vegetables. ? Chew sugar-free gum.  Changing how you eat: ? Eat small portion sizes at meals. ? Eat 4-6 small meals  throughout the day instead of 1-2 large meals a day. ? Be mindful when you eat. Do not watch television or do other things that might distract you as you eat.  Exercising regularly: ? Make time to exercise each day. If you do not have time for a long workout, do short bouts of exercise for 5-10 minutes several times a day. ? Do some form of strengthening exercise, like weight lifting, and some form of aerobic exercise, like running or swimming.  Drinking plenty of  water or other low-calorie or no-calorie drinks. Drink 6-8 glasses of water daily, or as much as instructed by your health care provider. Summary  Quitting smoking is a physical and mental challenge. You will face cravings, withdrawal symptoms, and temptation to smoke again. Preparation can help you as you go through these challenges.  You can cope with cravings by keeping your mouth busy (such as by chewing gum), keeping your body and hands busy, and making calls to family, friends, or a helpline for people who want to quit smoking.  You can cope with withdrawal symptoms by avoiding places where people smoke, avoiding drinks with caffeine, and getting plenty of rest.  Ask your health care provider about the different ways to prevent weight gain, avoid stress, and handle social situations. This information is not intended to replace advice given to you by your health care provider. Make sure you discuss any questions you have with your health care provider. Document Revised: 07/03/2017 Document Reviewed: 07/18/2016 Elsevier Patient Education  2020 [employer].

## 2020-06-26 NOTE — Progress Notes (Signed)
Pt here for follow up on breast abscess. Started as small red bump with 2 puncture marks then 3-4 days was the size of a quarter piece. Shower opened area up and drained for a few day. Pt states area is almost gone.   Pt also would like recheck of cough. Pt did not get inhaler due to cost. Pt had Ventolin that she had at home. Pt wonders about RSV? Has taken OTC cough meds. Feeling some drainage. All clear mucus.  Pt also would like referral to Dr.Gross in [place] for hemorrhoids.     Patient ID: [name], female    DOB: [birth year], 64 y.o.   MRN: [id number]   Chief Complaint  Patient presents with  . Breast Abscess   Subjective:  CC: follow-up breast abscess  Here today to follow-up on right breast abscess.  This was diagnosed on November 12, started on antibiotics at that time.  She reports that it is much better, mostly healed.  At the time of diagnosis for the breast abscess, she was being seen for an acute visit, needed refill on her inhaler, reported that albuterol caused tremor, and at that time levo albuterol was ordered, she reports that this was extremely expensive, she did not fill this prescription, she is used albuterol inhaler that she had previously with no adverse effects.  We will remove albuterol as an allergy/intolerance.    Medical History [place] has a past medical history of Anxiety, Arthritis, Asthma, Coronary atherosclerosis of native coronary artery, Depression, Essential hypertension, Fibromyalgia, GERD (gastroesophageal reflux disease), History of blood transfusion (2005), History of kidney stones, Hyperlipidemia, Hypothyroidism, Myocardial infarction ([place]) (2017), NSTEMI (non-ST elevated myocardial infarction) ([place]) (2005), Palpitations, and Sleep apnea.   Outpatient Encounter Medications as of 06/26/2020  Medication Sig  . amitriptyline (ELAVIL) 25 MG tablet Take 1 tablet by mouth at bedtime.  Marland Kitchen atorvastatin (LIPITOR) 40 MG tablet TAKE 1 TABLET  EVERY DAY (STOP THE 80MG )  . Biotin 10000 MCG TABS Take 1 tablet by mouth daily.  . clopidogrel (PLAVIX) 75 MG tablet TAKE 1 TABLET EVERY DAY  . Cyanocobalamin (VITAMIN B-12 PO) Take 1 tablet by mouth daily.  Marland Kitchen doxycycline (VIBRA-TABS) 100 MG tablet Take 1 tablet (100 mg total) by mouth 2 (two) times daily.  Marland Kitchen FLUoxetine (PROZAC) 20 MG capsule Take 3 capsule po daily  . HYDROcodone-acetaminophen (NORCO/VICODIN) 5-325 MG tablet Take 1 tablet by mouth every 6 (six) hours as needed for moderate pain.  Marland Kitchen lidocaine (LIDODERM) 5 % Place 1 patch onto the skin daily as needed.  . nitroGLYCERIN (NITROSTAT) 0.4 MG SL tablet DISSOLVE 1 TABLET UNDER TONGUE EVERY 5 MINUTES FOR UP TO 3 DOSES AS NEEDED FOR CHEST PAIN.  Marland Kitchen PROLENSA 0.07 % SOLN Place 1 drop into the right eye at bedtime.  Marland Kitchen tiZANidine (ZANAFLEX) 4 MG tablet Take 1-2 tablets by mouth daily.  . [DISCONTINUED] ALPRAZolam (XANAX) 0.5 MG tablet Take one tablet daily as needed for anxiety.  . [DISCONTINUED] isosorbide mononitrate (IMDUR) 30 MG 24 hr tablet TAKE 1/2 TABLET ONE TIME DAILY  . [DISCONTINUED] levalbuterol (XOPENEX HFA) 45 MCG/ACT inhaler Inhale 2 puffs into the lungs every 4 (four) hours as needed for wheezing.  . [DISCONTINUED] levothyroxine (SYNTHROID) 125 MCG tablet TAKE 1/2 TABLET ON SUNDAYS AND ONE TABLET ALL OTHER DAYS  . [DISCONTINUED] pantoprazole (PROTONIX) 40 MG tablet TAKE 1 TABLET EVERY DAY  . albuterol (VENTOLIN HFA) 108 (90 Base) MCG/ACT inhaler Inhale 2 puffs into the lungs every 6 (six) hours as  needed for wheezing or shortness of breath.  . metoprolol succinate (TOPROL-XL) 50 MG 24 hr tablet TAKE 1 TABLET (50 MG TOTAL) BY MOUTH DAILY. TAKE WITH OR IMMEDIATELY FOLLOWING A MEAL.   No facility-administered encounter medications on file as of 06/26/2020.     Review of Systems  Constitutional: Negative for chills and fever.  Respiratory: Negative for shortness of breath.   Cardiovascular: Negative for chest pain.      Vitals BP 136/72   Pulse 94   Temp (!) 96.2 F (35.7 C)   Wt 235 lb 9.6 oz (106.9 kg)   SpO2 93%   BMI 38.03 kg/m   Objective:   Physical Exam Vitals and nursing note reviewed.  Constitutional:      Appearance: Normal appearance.  Cardiovascular:     Rate and Rhythm: Normal rate and regular rhythm.  Pulmonary:     Effort: Pulmonary effort is normal.     Breath sounds: Normal breath sounds.  Skin:    General: Skin is warm and dry.  Neurological:     Mental Status: She is alert and oriented to person, place, and time.  Psychiatric:        Mood and Affect: Mood normal.        Behavior: Behavior normal.        Thought Content: Thought content normal.        Judgment: Judgment normal.     Comments: PHQ-9: 12.  Receives psychiatric care through [facility], on Xanax daily, feels much better, depression and anxiety well controlled.      Assessment and Plan   1. Bronchitis with wheezing - albuterol (VENTOLIN HFA) 108 (90 Base) MCG/ACT inhaler; Inhale 2 puffs into the lungs every 6 (six) hours as needed for wheezing or shortness of breath.  Dispense: 18 g; Refill: 1  2. Screening mammogram, encounter for - MM 3D SCREEN BREAST BILATERAL  3. Need for vaccination - Flu Vaccine QUAD High Dose(Fluad)  4. External hemorrhoid - Ambulatory referral to General Surgery  5. Breast abscess of female   Follow-up for breast abscess, diagnosed on November 12, this has resolved.  She is due for her screening mammogram, this is ordered today.  She request referral to [facility] surgery, Dr. Neysa Bonito, for hemorrhoid management.  She wishes to wait on GI referral for colonoscopy, feels this might be needed prior to her hemorrhoid surgery.  She will receive her influenza injection today.  Will refill her albuterol inhaler.  Agrees with plan of care discussed today. Understands warning signs to seek further care: Chest pain, shortness of breath, any significant change in  health. Understands to follow-up for annual Medicare wellness, schedule on the way out.    Chalmers Guest, NP 06/27/2020

## 2020-06-27 ENCOUNTER — Other Ambulatory Visit: Payer: Self-pay

## 2020-06-27 ENCOUNTER — Encounter: Payer: Self-pay | Admitting: Family Medicine

## 2020-06-27 DIAGNOSIS — F411 Generalized anxiety disorder: Secondary | ICD-10-CM

## 2020-06-27 DIAGNOSIS — Z23 Encounter for immunization: Secondary | ICD-10-CM | POA: Insufficient documentation

## 2020-06-27 MED ORDER — ALPRAZOLAM 0.5 MG PO TABS: ORAL_TABLET | ORAL | 0 refills | Status: DC

## 2020-07-05 ENCOUNTER — Inpatient Hospital Stay (HOSPITAL_COMMUNITY): Admission: RE | Admit: 2020-07-05 | Payer: Medicare HMO | Source: Ambulatory Visit

## 2020-07-06 ENCOUNTER — Ambulatory Visit: Payer: Medicare HMO | Admitting: Family Medicine

## 2020-07-16 ENCOUNTER — Telehealth: Payer: Self-pay

## 2020-07-17 ENCOUNTER — Encounter: Payer: Self-pay | Admitting: Family Medicine

## 2020-07-17 ENCOUNTER — Ambulatory Visit (INDEPENDENT_AMBULATORY_CARE_PROVIDER_SITE_OTHER): Payer: Medicare HMO | Admitting: Family Medicine

## 2020-07-17 VITALS — BP 128/86 | HR 96 | Temp 97.1°F | Ht 66.0 in | Wt 235.4 lb

## 2020-07-17 DIAGNOSIS — J019 Acute sinusitis, unspecified: Secondary | ICD-10-CM

## 2020-07-17 DIAGNOSIS — R5383 Other fatigue: Secondary | ICD-10-CM

## 2020-07-17 DIAGNOSIS — E782 Mixed hyperlipidemia: Secondary | ICD-10-CM | POA: Diagnosis not present

## 2020-07-17 DIAGNOSIS — R7303 Prediabetes: Secondary | ICD-10-CM | POA: Diagnosis not present

## 2020-07-17 DIAGNOSIS — B9689 Other specified bacterial agents as the cause of diseases classified elsewhere: Secondary | ICD-10-CM

## 2020-07-17 DIAGNOSIS — Z0000 Encounter for general adult medical examination without abnormal findings: Secondary | ICD-10-CM | POA: Diagnosis not present

## 2020-07-17 DIAGNOSIS — K760 Fatty (change of) liver, not elsewhere classified: Secondary | ICD-10-CM | POA: Diagnosis not present

## 2020-07-17 DIAGNOSIS — E039 Hypothyroidism, unspecified: Secondary | ICD-10-CM | POA: Diagnosis not present

## 2020-07-17 DIAGNOSIS — Z1211 Encounter for screening for malignant neoplasm of colon: Secondary | ICD-10-CM

## 2020-07-17 MED ORDER — FLUCONAZOLE 150 MG PO TABS: 150.0000 mg | ORAL_TABLET | Freq: Once | ORAL | 0 refills | Status: AC

## 2020-07-17 MED ORDER — ATORVASTATIN CALCIUM 40 MG PO TABS: ORAL_TABLET | ORAL | 1 refills | Status: DC

## 2020-07-17 MED ORDER — AMOXICILLIN 500 MG PO CAPS: 500.0000 mg | ORAL_CAPSULE | Freq: Three times a day (TID) | ORAL | 0 refills | Status: DC

## 2020-07-17 MED ORDER — FLUOXETINE HCL 20 MG PO CAPS: ORAL_CAPSULE | ORAL | 1 refills | Status: DC

## 2020-07-17 NOTE — Telephone Encounter (Signed)
Labs ordered, pt notified.

## 2020-07-17 NOTE — Progress Notes (Signed)
The patient comes in today for a wellness visit.    Patient ID: [name], female    DOB: January 18, 1956, 64 y.o.   MRN: [id number]   Chief Complaint  Patient presents with  . Annual Exam   Subjective:  CC: wellness exam  Presents today for annual wellness.  She has been having issues with hemorrhoids, and general surgery referral made earlier and she has not heard from them.  We will check on that today.  She is a smoker, has cut back on her cigarettes, questioned medications to help her with this.  Her last labs were done October 17, 2019 and her last A1c was 6.1 at that time.  She does have a significant cardiac history has had 3 MIs in the past and sees cardiology every 6 months.  Her last visit with cardiology was August or September 2021.  She is scheduled for her mammogram tomorrow.  It has been a while since she has had her last colonoscopy-referral will be made today.  A review of their health history was completed.  A review of medications was also completed.  Any needed refills; yes  Eating habits: trying to eat good- go easy on sweets  Falls/  MVA accidents in past few months: none  Regular exercise: no  Specialist pt sees on regular basis: neurologist for spinal injections  Preventative health issues were discussed.   Additional concerns: none  Medical History [name] has a past medical history of Anxiety, Arthritis, Asthma, Coronary atherosclerosis of native coronary artery, Depression, Essential hypertension, Fibromyalgia, GERD (gastroesophageal reflux disease), History of blood transfusion (2005), History of kidney stones, Hyperlipidemia, Hypothyroidism, Myocardial infarction ([place]) (2017), NSTEMI (non-ST elevated myocardial infarction) ([place]) (2005), Palpitations, and Sleep apnea.   Outpatient Encounter Medications as of 07/17/2020  Medication Sig  . albuterol (VENTOLIN HFA) 108 (90 Base) MCG/ACT inhaler Inhale 2 puffs into the lungs every 6 (six) hours as needed  for wheezing or shortness of breath.  . ALPRAZolam (XANAX) 0.5 MG tablet Take one tablet daily as needed for anxiety.  Marland Kitchen amitriptyline (ELAVIL) 25 MG tablet Take 1 tablet by mouth at bedtime.  Marland Kitchen amoxicillin (AMOXIL) 500 MG capsule Take 1 capsule (500 mg total) by mouth 3 (three) times daily.  Marland Kitchen atorvastatin (LIPITOR) 40 MG tablet TAKE 1 TABLET EVERY DAY (STOP THE 80MG )  . Biotin 10000 MCG TABS Take 1 tablet by mouth daily.  . clopidogrel (PLAVIX) 75 MG tablet TAKE 1 TABLET EVERY DAY  . Cyanocobalamin (VITAMIN B-12 PO) Take 1 tablet by mouth daily.  . fluconazole (DIFLUCAN) 150 MG tablet Take 1 tablet (150 mg total) by mouth once for 1 dose.  Marland Kitchen FLUoxetine (PROZAC) 20 MG capsule Take 3 capsule po daily  . HYDROcodone-acetaminophen (NORCO/VICODIN) 5-325 MG tablet Take 1 tablet by mouth every 6 (six) hours as needed for moderate pain.  . isosorbide mononitrate (IMDUR) 30 MG 24 hr tablet TAKE 1/2 TABLET ONE TIME DAILY  . levothyroxine (SYNTHROID) 125 MCG tablet TAKE 1/2 TABLET ON SUNDAYS AND ONE TABLET ALL OTHER DAYS  . lidocaine (LIDODERM) 5 % Place 1 patch onto the skin daily as needed.  . metoprolol succinate (TOPROL-XL) 50 MG 24 hr tablet TAKE 1 TABLET (50 MG TOTAL) BY MOUTH DAILY. TAKE WITH OR IMMEDIATELY FOLLOWING A MEAL.  . nitroGLYCERIN (NITROSTAT) 0.4 MG SL tablet DISSOLVE 1 TABLET UNDER TONGUE EVERY 5 MINUTES FOR UP TO 3 DOSES AS NEEDED FOR CHEST PAIN.  Marland Kitchen pantoprazole (PROTONIX) 40 MG tablet TAKE 1 TABLET EVERY  DAY  . PROLENSA 0.07 % SOLN Place 1 drop into the right eye at bedtime.  Marland Kitchen tiZANidine (ZANAFLEX) 4 MG tablet Take 1-2 tablets by mouth daily.  . [DISCONTINUED] atorvastatin (LIPITOR) 40 MG tablet TAKE 1 TABLET EVERY DAY (STOP THE 80MG )  . [DISCONTINUED] doxycycline (VIBRA-TABS) 100 MG tablet Take 1 tablet (100 mg total) by mouth 2 (two) times daily.  . [DISCONTINUED] FLUoxetine (PROZAC) 20 MG capsule Take 3 capsule po daily   No facility-administered encounter medications on file  as of 07/17/2020.     Review of Systems  Constitutional: Negative for chills and fever.  HENT: Positive for postnasal drip. Negative for ear pain, sinus pressure, sinus pain and sore throat.   Respiratory: Negative for shortness of breath.   Cardiovascular: Negative for chest pain.  Gastrointestinal: Positive for rectal pain. Negative for abdominal pain.       Due to hemorrhoids.  Hematological: Positive for adenopathy.       Left neck.     Vitals BP 128/86   Pulse 96   Temp (!) 97.1 F (36.2 C) (Oral)   Ht 5\' 6"  (1.676 m)   Wt 235 lb 6.4 oz (106.8 kg)   SpO2 95%   BMI 37.99 kg/m   Objective:   Physical Exam Vitals reviewed.  Constitutional:      General: She is not in acute distress.    Appearance: Normal appearance.  HENT:     Head: Normocephalic.     Right Ear: Tympanic membrane normal.     Left Ear: Tympanic membrane normal.     Nose: Nose normal.     Mouth/Throat:     Mouth: Mucous membranes are moist.     Pharynx: No posterior oropharyngeal erythema.  Eyes:     Extraocular Movements: Extraocular movements intact.     Pupils: Pupils are equal, round, and reactive to light.  Cardiovascular:     Rate and Rhythm: Normal rate and regular rhythm.     Heart sounds: Normal heart sounds.  Pulmonary:     Effort: Pulmonary effort is normal.     Breath sounds: Normal breath sounds.  Chest:  Breasts: Breasts are symmetrical.     Right: Normal. No mass, skin change or tenderness.     Left: Normal. No mass, skin change or tenderness.    Abdominal:     General: Bowel sounds are normal.  Genitourinary:    Comments: Deferred, due to pain from hemorrhoids-per patient request. Musculoskeletal:        General: Normal range of motion.     Cervical back: Normal range of motion.     Right lower leg: No edema.     Left lower leg: No edema.  Skin:    General: Skin is warm and dry.  Neurological:     General: No focal deficit present.     Mental Status: She is alert.   Psychiatric:        Behavior: Behavior normal.      Assessment and Plan   1. Acute bacterial rhinosinusitis - amoxicillin (AMOXIL) 500 MG capsule; Take 1 capsule (500 mg total) by mouth 3 (three) times daily.  Dispense: 30 capsule; Refill: 0 - fluconazole (DIFLUCAN) 150 MG tablet; Take 1 tablet (150 mg total) by mouth once for 1 dose.  Dispense: 1 tablet; Refill: 0  2. Screening for colon cancer - Ambulatory referral to Gastroenterology  3. Mixed hyperlipidemia - Lipid panel  4. Fatty liver disease, nonalcoholic - Comprehensive metabolic panel  5.  Prediabetes - Comprehensive metabolic panel - Hemoglobin A1c  6. Hypothyroidism, unspecified type - TSH  7. Fatigue, unspecified type - CBC with Differential/Platelet  8. Wellness examination   Concerned for the left neck adenopathy, she was treated with antibiotics over a month ago for breast abscess, was seen at that time for congestion, feels this adenopathy has continued since that time.  Due to her history of smoking and will treat with antibiotics again, if this is not resolved, we have a low threshold for referral to ENT specialist.   Her specialist include: [facility]  She questioned using Chantix to assist with smoking cessation, due to her extensive cardiac history, advised her to discuss this with her cardiologist.  Does benefit outweigh risk?  Medications refill will be sent today, medications that are managed by this office.  Agrees with plan of care discussed today. Understands warning signs to seek further care: Chest pain, shortness of breath, any significant change in health. Understands to follow-up in 6 months for routine medication management, will get lab work later this week, she will return if the neck adenopathy does not completely resolve.  She understands with her smoking history, that this could be something more serious.  Pecolia Ades, FNP-C

## 2020-07-17 NOTE — Telephone Encounter (Signed)
Had physical today needing labs done

## 2020-07-17 NOTE — Patient Instructions (Signed)
Preventive Care 66 Years and Older, Female Preventive care refers to lifestyle choices and visits with your health care provider that can promote health and wellness. This includes:  A yearly physical exam. This is also called an annual well check.  Regular dental and eye exams.  Immunizations.  Screening for certain conditions.  Healthy lifestyle choices, such as diet and exercise. What can I expect for my preventive care visit? Physical exam Your health care provider will check:  Height and weight. These may be used to calculate body mass index (BMI), which is a measurement that tells if you are at a healthy weight.  Heart rate and blood pressure.  Your skin for abnormal spots. Counseling Your health care provider may ask you questions about:  Alcohol, tobacco, and drug use.  Emotional well-being.  Home and relationship well-being.  Sexual activity.  Eating habits.  History of falls.  Memory and ability to understand (cognition).  Work and work Statistician.  Pregnancy and menstrual history. What immunizations do I need?  Influenza (flu) vaccine  This is recommended every year. Tetanus, diphtheria, and pertussis (Tdap) vaccine  You may need a Td booster every 10 years. Varicella (chickenpox) vaccine  You may need this vaccine if you have not already been vaccinated. Zoster (shingles) vaccine  You may need this after age 33. Pneumococcal conjugate (PCV13) vaccine  One dose is recommended after age 33. Pneumococcal polysaccharide (PPSV23) vaccine  One dose is recommended after age 72. Measles, mumps, and rubella (MMR) vaccine  You may need at least one dose of MMR if you were born in 1957 or later. You may also need a second dose. Meningococcal conjugate (MenACWY) vaccine  You may need this if you have certain conditions. Hepatitis A vaccine  You may need this if you have certain conditions or if you travel or work in places where you may be exposed  to hepatitis A. Hepatitis B vaccine  You may need this if you have certain conditions or if you travel or work in places where you may be exposed to hepatitis B. Haemophilus influenzae type b (Hib) vaccine  You may need this if you have certain conditions. You may receive vaccines as individual doses or as more than one vaccine together in one shot (combination vaccines). Talk with your health care provider about the risks and benefits of combination vaccines. What tests do I need? Blood tests  Lipid and cholesterol levels. These may be checked every 5 years, or more frequently depending on your overall health.  Hepatitis C test.  Hepatitis B test. Screening  Lung cancer screening. You may have this screening every year starting at age 39 if you have a 30-pack-year history of smoking and currently smoke or have quit within the past 15 years.  Colorectal cancer screening. All adults should have this screening starting at age 36 and continuing until age 15. Your health care provider may recommend screening at age 23 if you are at increased risk. You will have tests every 1-10 years, depending on your results and the type of screening test.  Diabetes screening. This is done by checking your blood sugar (glucose) after you have not eaten for a while (fasting). You may have this done every 1-3 years.  Mammogram. This may be done every 1-2 years. Talk with your health care provider about how often you should have regular mammograms.  BRCA-related cancer screening. This may be done if you have a family history of breast, ovarian, tubal, or peritoneal cancers.  Other tests  Sexually transmitted disease (STD) testing.  Bone density scan. This is done to screen for osteoporosis. You may have this done starting at age 44. Follow these instructions at home: Eating and drinking  Eat a diet that includes fresh fruits and vegetables, whole grains, lean protein, and low-fat dairy products. Limit  your intake of foods with high amounts of sugar, saturated fats, and salt.  Take vitamin and mineral supplements as recommended by your health care provider.  Do not drink alcohol if your health care provider tells you not to drink.  If you drink alcohol: ? Limit how much you have to 0-1 drink a day. ? Be aware of how much alcohol is in your drink. In the [place] one drink equals one 12 oz bottle of beer (355 mL), one 5 oz glass of wine (148 mL), or one 1 oz glass of hard liquor (44 mL). Lifestyle  Take daily care of your teeth and gums.  Stay active. Exercise for at least 30 minutes on 5 or more days each week.  Do not use any products that contain nicotine or tobacco, such as cigarettes, e-cigarettes, and chewing tobacco. If you need help quitting, ask your health care provider.  If you are sexually active, practice safe sex. Use a condom or other form of protection in order to prevent STIs (sexually transmitted infections).  Talk with your health care provider about taking a low-dose aspirin or statin. What's next?  Go to your health care provider once a year for a well check visit.  Ask your health care provider how often you should have your eyes and teeth checked.  Stay up to date on all vaccines. This information is not intended to replace advice given to you by your health care provider. Make sure you discuss any questions you have with your health care provider. Document Revised: 07/15/2018 Document Reviewed: 07/15/2018 Elsevier Patient Education  2020 [employer].

## 2020-07-18 ENCOUNTER — Other Ambulatory Visit: Payer: Self-pay

## 2020-07-18 ENCOUNTER — Encounter (HOSPITAL_COMMUNITY): Payer: Self-pay

## 2020-07-18 ENCOUNTER — Ambulatory Visit (HOSPITAL_COMMUNITY)
Admission: RE | Admit: 2020-07-18 | Discharge: 2020-07-18 | Disposition: A | Discharge status: Home / Self Care | Payer: Medicare HMO | Source: Ambulatory Visit | Attending: Family Medicine | Admitting: Family Medicine

## 2020-07-18 DIAGNOSIS — Z1231 Encounter for screening mammogram for malignant neoplasm of breast: Secondary | ICD-10-CM | POA: Insufficient documentation

## 2020-07-19 DIAGNOSIS — M542 Cervicalgia: Secondary | ICD-10-CM | POA: Diagnosis not present

## 2020-07-19 DIAGNOSIS — G5603 Carpal tunnel syndrome, bilateral upper limbs: Secondary | ICD-10-CM | POA: Diagnosis not present

## 2020-07-19 DIAGNOSIS — F419 Anxiety disorder, unspecified: Secondary | ICD-10-CM | POA: Diagnosis not present

## 2020-07-19 DIAGNOSIS — M545 Low back pain, unspecified: Secondary | ICD-10-CM | POA: Diagnosis not present

## 2020-07-19 DIAGNOSIS — Z79899 Other long term (current) drug therapy: Secondary | ICD-10-CM | POA: Diagnosis not present

## 2020-07-20 ENCOUNTER — Encounter (INDEPENDENT_AMBULATORY_CARE_PROVIDER_SITE_OTHER): Payer: Self-pay | Admitting: *Deleted

## 2020-08-09 ENCOUNTER — Other Ambulatory Visit: Payer: Self-pay | Admitting: Family Medicine

## 2020-09-21 ENCOUNTER — Emergency Department (HOSPITAL_COMMUNITY)
Admission: EM | Admit: 2020-09-21 | Discharge: 2020-09-21 | Disposition: A | Discharge status: Home / Self Care | Payer: Medicare HMO | Attending: Emergency Medicine | Admitting: Emergency Medicine

## 2020-09-21 ENCOUNTER — Ambulatory Visit (INDEPENDENT_AMBULATORY_CARE_PROVIDER_SITE_OTHER): Payer: Medicare HMO | Admitting: Family Medicine

## 2020-09-21 ENCOUNTER — Other Ambulatory Visit: Payer: Self-pay

## 2020-09-21 ENCOUNTER — Encounter (HOSPITAL_COMMUNITY): Payer: Self-pay | Admitting: Emergency Medicine

## 2020-09-21 ENCOUNTER — Encounter: Payer: Self-pay | Admitting: Family Medicine

## 2020-09-21 ENCOUNTER — Emergency Department (HOSPITAL_COMMUNITY): Payer: Medicare HMO

## 2020-09-21 VITALS — BP 102/64 | HR 77 | Temp 97.8°F | Resp 24

## 2020-09-21 DIAGNOSIS — E039 Hypothyroidism, unspecified: Secondary | ICD-10-CM | POA: Insufficient documentation

## 2020-09-21 DIAGNOSIS — R059 Cough, unspecified: Secondary | ICD-10-CM | POA: Diagnosis not present

## 2020-09-21 DIAGNOSIS — F17210 Nicotine dependence, cigarettes, uncomplicated: Secondary | ICD-10-CM | POA: Diagnosis not present

## 2020-09-21 DIAGNOSIS — I2510 Atherosclerotic heart disease of native coronary artery without angina pectoris: Secondary | ICD-10-CM | POA: Diagnosis not present

## 2020-09-21 DIAGNOSIS — I119 Hypertensive heart disease without heart failure: Secondary | ICD-10-CM | POA: Insufficient documentation

## 2020-09-21 DIAGNOSIS — R0600 Dyspnea, unspecified: Secondary | ICD-10-CM

## 2020-09-21 DIAGNOSIS — R0602 Shortness of breath: Secondary | ICD-10-CM | POA: Diagnosis not present

## 2020-09-21 DIAGNOSIS — J45909 Unspecified asthma, uncomplicated: Secondary | ICD-10-CM | POA: Insufficient documentation

## 2020-09-21 DIAGNOSIS — Z79899 Other long term (current) drug therapy: Secondary | ICD-10-CM | POA: Insufficient documentation

## 2020-09-21 DIAGNOSIS — R0609 Other forms of dyspnea: Secondary | ICD-10-CM

## 2020-09-21 DIAGNOSIS — J1282 Pneumonia due to coronavirus disease 2019: Secondary | ICD-10-CM | POA: Insufficient documentation

## 2020-09-21 DIAGNOSIS — J189 Pneumonia, unspecified organism: Secondary | ICD-10-CM | POA: Diagnosis not present

## 2020-09-21 DIAGNOSIS — R531 Weakness: Secondary | ICD-10-CM | POA: Diagnosis not present

## 2020-09-21 DIAGNOSIS — U071 COVID-19: Secondary | ICD-10-CM | POA: Insufficient documentation

## 2020-09-21 DIAGNOSIS — R519 Headache, unspecified: Secondary | ICD-10-CM | POA: Diagnosis not present

## 2020-09-21 LAB — POC SARS CORONAVIRUS 2 AG -  ED: SARS Coronavirus 2 Ag: POSITIVE — AB

## 2020-09-21 MED ORDER — BENZONATATE 200 MG PO CAPS: 200.0000 mg | ORAL_CAPSULE | Freq: Three times a day (TID) | ORAL | 0 refills | Status: DC | PRN

## 2020-09-21 MED ORDER — DEXAMETHASONE SODIUM PHOSPHATE 10 MG/ML IJ SOLN
10.0000 mg | Freq: Once | INTRAMUSCULAR | Status: AC
  Administered 2020-09-21: 10 mg via INTRAMUSCULAR
  Filled 2020-09-21: qty 1

## 2020-09-21 MED ORDER — PREDNISONE 20 MG PO TABS: 40.0000 mg | ORAL_TABLET | Freq: Every day | ORAL | 0 refills | Status: DC

## 2020-09-21 MED ORDER — ALBUTEROL SULFATE HFA 108 (90 BASE) MCG/ACT IN AERS
4.0000 | INHALATION_SPRAY | Freq: Once | RESPIRATORY_TRACT | Status: AC
  Administered 2020-09-21: 4 via RESPIRATORY_TRACT
  Filled 2020-09-21: qty 6.7

## 2020-09-21 NOTE — ED Notes (Signed)
Pt ambulated around room. Sats maintained 90-92% on RA. Pt c/o SOB with exertion.

## 2020-09-21 NOTE — ED Provider Notes (Signed)
[facility] EMERGENCY DEPARTMENT Provider Note   CSN: [id number] Arrival date & time: 09/21/20  1459     History Chief Complaint  Patient presents with  . Cough    ALZORA HA is a 65 y.o. female.  HPI      CHAUNDRA ABREU is a 65 y.o. female with past medical history of coronary artery disease, hypertension, asthma and previous MI in 2005 who presents to the Emergency Department complaining of frontal headache, generalized body aches, generalized weakness, cough and fatigue.  Symptoms have been present for 10 days.  At onset, she notes subjective fever and chills that improved after Tylenol.  No recent fever.  Cough mostly nonproductive.  No hemoptysis.  She states she is short of breath with exertion.  Wheezing intermittently for several days.  Uses her albuterol MDI at home with temporary relief.  No chest pain, vomiting, diarrhea or abdominal pain. Denies known Covid exposures, but endorses going to the grocery store.  No sick contacts at home.  She is unvaccinated for COVID-19.  She was seen earlier today by her PCP and advised to come to the emergency department for further evaluation of her shortness of breath.    Past Medical History:  Diagnosis Date  . Anxiety   . Arthritis   . Asthma   . Coronary atherosclerosis of native coronary artery    a. s/p DES to LAD and D1 in 2005 b. cath in 11/2015 showing patent stents with mild disease along RCA and LCx  . Depression    Prior suicide attempt  . Essential hypertension   . Fibromyalgia   . GERD (gastroesophageal reflux disease)   . History of blood transfusion 2005  . History of kidney stones   . Hyperlipidemia   . Hypothyroidism   . Myocardial infarction ([place]) 2017  . NSTEMI (non-ST elevated myocardial infarction) ([place]) 2005  . Palpitations    PVC's have been noted  . Sleep apnea     Patient Active Problem List   Diagnosis Date Noted  . Dyspnea on exertion 09/21/2020  . Acute bacterial rhinosinusitis  07/17/2020  . Prediabetes 07/17/2020  . Fatigue 07/17/2020  . Need for vaccination 06/27/2020  . Breast abscess of female 06/15/2020  . Bronchitis with wheezing 06/15/2020  . Yeast infection of the vagina 06/15/2020  . Unstable angina ([place]) 10/21/2017  . Screening mammogram, encounter for 10/09/2017  . B12 deficiency 03/17/2017  . Bradycardia   . NSTEMI (non-ST elevated myocardial infarction) ([place]) 11/21/2015  . Elevated troponin   . Chest pain on exertion 11/20/2015  . Cervical myelopathy ([place]) 09/04/2015  . Morbid obesity ([place]) 09/12/2014  . External hemorrhoid 04/30/2014  . Irritable bowel syndrome 04/30/2014  . Fatty liver disease, nonalcoholic 84/66/5993  . Impaired fasting glucose 11/18/2013  . Anxiety and depression 11/10/2013  . GERD (gastroesophageal reflux disease) 11/10/2013  . Scalp psoriasis 12/29/2012  . Fibromyalgia 12/29/2012  . Essential hypertension, benign 02/08/2010  . Hypothyroidism 03/08/2009  . Mixed hyperlipidemia 03/08/2009  . CORONARY ATHEROSCLEROSIS NATIVE CORONARY ARTERY 03/08/2009    Past Surgical History:  Procedure Laterality Date  . CARDIAC CATHETERIZATION  ~ 2009/2010  . CARDIAC CATHETERIZATION N/A 11/21/2015   Procedure: Left Heart Cath and Coronary Angiography;  Surgeon: Burnell Blanks, MD;  Location: [facility];  Service: Cardiovascular;  Laterality: N/A;  . CARDIAC CATHETERIZATION  10/21/2017  . CARPAL TUNNEL RELEASE Bilateral 2007- 2008  . CORONARY ANGIOPLASTY WITH STENT PLACEMENT  2005  . FOOT BONE EXCISION  Sesmoid bone left foot-repair of fracture  . LEFT HEART CATH AND CORONARY ANGIOGRAPHY N/A 10/21/2017   Procedure: LEFT HEART CATH AND CORONARY ANGIOGRAPHY;  Surgeon: Wellington Hampshire, MD;  Location: [facility];  Service: Cardiovascular;  Laterality: N/A;  . TUBAL LIGATION       OB History   No obstetric history on file.     Family History  Problem Relation Age of Onset  . Coronary artery disease  Father        MI age 34  . Heart failure Father   . Heart failure Mother   . Anesthesia problems Neg Hx   . Hypotension Neg Hx   . Malignant hyperthermia Neg Hx   . Pseudochol deficiency Neg Hx     Social History   Tobacco Use  . Smoking status: Current Every Day Smoker    Packs/day: 0.33    Years: 49.00    Pack years: 16.17    Types: Cigarettes    Start date: 05/31/1969  . Smokeless tobacco: Never Used  . Tobacco comment: 1 pack per week & vapes also   Vaping Use  . Vaping Use: Every day  Substance Use Topics  . Alcohol use: No    Alcohol/week: 0.0 standard drinks  . Drug use: No    Home Medications Prior to Admission medications   Medication Sig Start Date End Date Taking? Authorizing Provider  albuterol (VENTOLIN HFA) 108 (90 Base) MCG/ACT inhaler Inhale 2 puffs into the lungs every 6 (six) hours as needed for wheezing or shortness of breath. 06/26/20   Chalmers Guest, NP  ALPRAZolam Duanne Moron) 0.5 MG tablet Take one tablet daily as needed for anxiety. 06/27/20   Mozingo, Berdie Ogren, NP  amitriptyline (ELAVIL) 25 MG tablet Take 1 tablet by mouth at bedtime. 03/17/19   [provider]  amoxicillin (AMOXIL) 500 MG capsule Take 1 capsule (500 mg total) by mouth 3 (three) times daily. 07/17/20   Chalmers Guest, NP  atorvastatin (LIPITOR) 40 MG tablet TAKE 1 TABLET EVERY DAY (STOP THE 80MG ) 07/17/20   Chalmers Guest, NP  Biotin 10000 MCG TABS Take 1 tablet by mouth daily.    [provider]  clopidogrel (PLAVIX) 75 MG tablet TAKE 1 TABLET EVERY DAY 06/18/20   Kathyrn Drown, MD  Cyanocobalamin (VITAMIN B-12 PO) Take 1 tablet by mouth daily.    [provider]  FLUoxetine (PROZAC) 20 MG capsule Take 3 capsule po daily 07/17/20   Chalmers Guest, NP  HYDROcodone-acetaminophen (NORCO/VICODIN) 5-325 MG tablet Take 1 tablet by mouth every 6 (six) hours as needed for moderate pain.    [provider]  isosorbide mononitrate (IMDUR) 30 MG 24 hr  tablet TAKE 1/2 TABLET ONE TIME DAILY 06/26/20   Kathyrn Drown, MD  levothyroxine (SYNTHROID) 125 MCG tablet TAKE 1/2 TABLET ON SUNDAYS AND ONE TABLET ALL OTHER DAYS 06/26/20   Kathyrn Drown, MD  lidocaine (LIDODERM) 5 % Place 1 patch onto the skin daily as needed. 03/07/19   [provider]  metoprolol succinate (TOPROL-XL) 50 MG 24 hr tablet TAKE 1 TABLET (50 MG TOTAL) BY MOUTH DAILY. TAKE WITH OR IMMEDIATELY FOLLOWING A MEAL. 02/07/20 05/07/20  Satira Sark, MD  nitroGLYCERIN (NITROSTAT) 0.4 MG SL tablet DISSOLVE 1 TABLET UNDER TONGUE EVERY 5 MINUTES FOR UP TO 3 DOSES AS NEEDED FOR CHEST PAIN. 05/16/19   Kathyrn Drown, MD  pantoprazole (PROTONIX) 40 MG tablet TAKE 1 TABLET EVERY DAY  06/26/20   Luking, Elayne Snare, MD  PROLENSA 0.07 % SOLN Place 1 drop into the right eye at bedtime. 08/18/19   [provider]  tiZANidine (ZANAFLEX) 4 MG tablet Take 1-2 tablets by mouth daily. 03/17/19   [provider]    Allergies    Cymbalta [duloxetine hcl] and Lyrica [pregabalin]  Review of Systems   Review of Systems  Constitutional: Positive for fatigue. Negative for appetite change, chills and fever.  HENT: Negative for congestion, sore throat and trouble swallowing.   Respiratory: Positive for cough, chest tightness, shortness of breath and wheezing.   Cardiovascular: Negative for chest pain.  Gastrointestinal: Negative for abdominal pain, diarrhea, nausea and vomiting.  Genitourinary: Negative for dysuria.  Musculoskeletal: Positive for myalgias. Negative for arthralgias.  Skin: Negative for rash.  Neurological: Positive for weakness (generalized weakness) and headaches. Negative for dizziness, syncope and numbness.  Hematological: Negative for adenopathy.    Physical Exam Updated Vital Signs BP 113/69   Pulse 74   Temp 98.2 F (36.8 C) (Oral)   Resp 20   Ht 5\' 6"  (1.676 m)   Wt 106.6 kg   SpO2 90%   BMI 37.93 kg/m   Physical Exam Vitals and nursing note  reviewed.  Constitutional:      General: She is not in acute distress.    Appearance: Normal appearance.     Comments: Patient is uncomfortable appearing  HENT:     Nose: Nose normal.  Cardiovascular:     Rate and Rhythm: Normal rate and regular rhythm.     Pulses: Normal pulses.  Pulmonary:     Effort: Pulmonary effort is normal.     Breath sounds: Wheezing present.     Comments: Diffuse expiratory wheezes throughout, no increased work of breathing.  No rales. Abdominal:     Palpations: Abdomen is soft.     Tenderness: There is no abdominal tenderness.  Musculoskeletal:        General: Normal range of motion.     Cervical back: Normal range of motion.     Right lower leg: No edema.     Left lower leg: No edema.  Lymphadenopathy:     Cervical: No cervical adenopathy.  Skin:    General: Skin is warm.     Capillary Refill: Capillary refill takes less than 2 seconds.     Findings: No rash.  Neurological:     General: No focal deficit present.     Mental Status: She is alert.     Sensory: No sensory deficit.     Motor: No weakness.     ED Results / Procedures / Treatments   Labs (all labs ordered are listed, but only abnormal results are displayed) Labs Reviewed  POC SARS CORONAVIRUS 2 AG -  ED - Abnormal; Notable for the following components:      Result Value   SARS Coronavirus 2 Ag POSITIVE (*)    All other components within normal limits    EKG None  Radiology DG Chest Portable 1 View  Result Date: 09/21/2020 CLINICAL DATA:  Cough, short of breath, headache, weakness, COVID-19 positive EXAM: PORTABLE CHEST 1 VIEW COMPARISON:  05/05/2018 FINDINGS: Single frontal view of the chest demonstrates a stable cardiac silhouette. Moderate diffuse interstitial and ground-glass opacities are seen consistent with COVID-19 pneumonia. No effusion or pneumothorax. No acute bony abnormalities. IMPRESSION: 1. Moderate multifocal bilateral pneumonia compatible with COVID 19.  Electronically Signed   By: Randa Ngo M.D.   On:  09/21/2020 16:19    Procedures Procedures   Medications Ordered in ED Medications  albuterol (VENTOLIN HFA) 108 (90 Base) MCG/ACT inhaler 4 puff (4 puffs Inhalation Given 09/21/20 1736)    ED Course  I have reviewed the triage vital signs and the nursing notes.  Pertinent labs & imaging results that were available during my care of the patient were reviewed by me and considered in my medical decision making (see chart for details).    MDM Rules/Calculators/A&P                          Patient here with symptoms concerning for COVID-19.  Present for 10 days.  Patient is unvaccinated.  Seen by PCP earlier today and advised to come here for further evaluation.  On exam, patient is uncomfortable appearing but nontoxic.  No tachycardia or tachypnea.  O2 sat 90% on room air at time of exam.  She has diffuse expiratory wheezes.  Uses albuterol at home.  No history of supplemental oxygen use.  Chest x-ray shows multifocal pneumonia and Covid antigen testing here is positive.  Pt given albuterol here, ambulated in the room and O2 sat 90-92% on RA.  Pt out of window for antiviral tx. No hypoxia.  Appears appropriate for d/c home. Isolation guidelines discussed.  Will dispense albuterol MDI and rx written for steroid.  Given strict return precautions and advised to use portable pulse oximeter  ELADIA FRAME was evaluated in Emergency Department on 09/21/2020 for the symptoms described in the history of present illness. She was evaluated in the context of the global COVID-19 pandemic, which necessitated consideration that the patient might be at risk for infection with the SARS-CoV-2 virus that causes COVID-19. Institutional protocols and algorithms that pertain to the evaluation of patients at risk for COVID-19 are in a state of rapid change based on information released by regulatory bodies including the CDC and federal and state organizations.  These policies and algorithms were followed during the patient's care in the ED.  Final Clinical Impression(s) / ED Diagnoses Final diagnoses:  Pneumonia due to COVID-19 virus    Rx / DC Orders ED Discharge Orders    None       Kem Parkinson, PA-C 09/22/20 2253    Fredia Sorrow, MD 10/10/20 [phone]

## 2020-09-21 NOTE — Progress Notes (Signed)
Patient presents today with respiratory illness Number of days present- 10 days  Symptoms include- headache, fever, no energy, cough  Presence of worrisome signs (severe shortness of breath, lethargy, etc.) -no  Recent/current visit to urgent care or ER-no  Recent direct exposure to Covid-unknown  Any current Covid testing-no     Patient ID: [name], female    DOB: [birth year], 65 y.o.   MRN: [id number]   Chief Complaint  Patient presents with  . Headache   Subjective:  CC: headache, fever, chills, fatigue, cough  This is a new problem.  Presents today with a complaint of headache, fever, chills, extreme fatigue, congestion, and cough.  Does not know what her T-max is has not measured her temperature.  Symptoms started last week with headache, symptoms have worsened in the last 2 days.  Feels as though she is going to choke while she is lying down at night.  Has tried Tylenol, cough medicine has not been tested for Covid, no known Covid exposures.    Medical History [name] has a past medical history of Anxiety, Arthritis, Asthma, Coronary atherosclerosis of native coronary artery, Depression, Essential hypertension, Fibromyalgia, GERD (gastroesophageal reflux disease), History of blood transfusion (2005), History of kidney stones, Hyperlipidemia, Hypothyroidism, Myocardial infarction ([place]) (2017), NSTEMI (non-ST elevated myocardial infarction) ([place]) (2005), Palpitations, and Sleep apnea.   Outpatient Encounter Medications as of 09/21/2020  Medication Sig  . albuterol (VENTOLIN HFA) 108 (90 Base) MCG/ACT inhaler Inhale 2 puffs into the lungs every 6 (six) hours as needed for wheezing or shortness of breath.  . ALPRAZolam (XANAX) 0.5 MG tablet Take one tablet daily as needed for anxiety.  Marland Kitchen amitriptyline (ELAVIL) 25 MG tablet Take 1 tablet by mouth at bedtime.  Marland Kitchen amoxicillin (AMOXIL) 500 MG capsule Take 1 capsule (500 mg total) by mouth 3 (three) times daily.  Marland Kitchen  atorvastatin (LIPITOR) 40 MG tablet TAKE 1 TABLET EVERY DAY (STOP THE 80MG )  . Biotin 10000 MCG TABS Take 1 tablet by mouth daily.  . clopidogrel (PLAVIX) 75 MG tablet TAKE 1 TABLET EVERY DAY  . Cyanocobalamin (VITAMIN B-12 PO) Take 1 tablet by mouth daily.  Marland Kitchen FLUoxetine (PROZAC) 20 MG capsule Take 3 capsule po daily  . HYDROcodone-acetaminophen (NORCO/VICODIN) 5-325 MG tablet Take 1 tablet by mouth every 6 (six) hours as needed for moderate pain.  . isosorbide mononitrate (IMDUR) 30 MG 24 hr tablet TAKE 1/2 TABLET ONE TIME DAILY  . levothyroxine (SYNTHROID) 125 MCG tablet TAKE 1/2 TABLET ON SUNDAYS AND ONE TABLET ALL OTHER DAYS  . lidocaine (LIDODERM) 5 % Place 1 patch onto the skin daily as needed.  . nitroGLYCERIN (NITROSTAT) 0.4 MG SL tablet DISSOLVE 1 TABLET UNDER TONGUE EVERY 5 MINUTES FOR UP TO 3 DOSES AS NEEDED FOR CHEST PAIN.  Marland Kitchen pantoprazole (PROTONIX) 40 MG tablet TAKE 1 TABLET EVERY DAY  . PROLENSA 0.07 % SOLN Place 1 drop into the right eye at bedtime.  Marland Kitchen tiZANidine (ZANAFLEX) 4 MG tablet Take 1-2 tablets by mouth daily.  . metoprolol succinate (TOPROL-XL) 50 MG 24 hr tablet TAKE 1 TABLET (50 MG TOTAL) BY MOUTH DAILY. TAKE WITH OR IMMEDIATELY FOLLOWING A MEAL.   No facility-administered encounter medications on file as of 09/21/2020.     Review of Systems  Constitutional: Positive for chills, fatigue and fever.  HENT: Positive for congestion and postnasal drip. Negative for sinus pressure and sinus pain.   Respiratory: Positive for cough and shortness of breath.  With coughing  Cardiovascular: Negative for chest pain.  Gastrointestinal: Negative for abdominal pain, diarrhea, nausea and vomiting.  Neurological: Positive for headaches. Negative for dizziness and light-headedness.     Vitals BP 102/64   Pulse 77   Temp 97.8 F (36.6 C)   Resp (!) 24   SpO2 92%   Objective:   Physical Exam Constitutional:      General: She is in acute distress.     Appearance:  She is ill-appearing.  Cardiovascular:     Rate and Rhythm: Normal rate.  Pulmonary:     Breath sounds: Wheezing and rales present.      Assessment and Plan   1. Dyspnea on exertion   Ill appearing, extreme dyspnea on exertion from walking into exam room, oxygen saturation 87% initially, as she recovered and improved to 92%.  Patient is a smoker, lung sounds with  wheezing and crackles.  Dr. Sallee Lange consulted while patient in office, agrees patient should be seen in the emergency department.  Instructed patient to report to the emergency room right now for further evaluation.  Dr. Sallee Lange phoned emergency room physician to be on the alert of her arrival.  Agrees with plan of care discussed today. Understands warning signs to seek further care: chest pain, shortness of breath, any significant change in health.  Understands to go immediately to the emergency department for further evaluation, and work-up.  Will follow along in EMR.    Chalmers Guest, NP 09/21/2020

## 2020-09-21 NOTE — ED Triage Notes (Addendum)
Pt c/o of headache, dry cough and weakness x 10 days

## 2020-09-21 NOTE — Discharge Instructions (Addendum)
Your Covid test today was positive.  Your chest x-ray shows that you have a pneumonia that is related to the Covid infection.  Continue Tylenol every 4-6 hours if needed for fever and/or body aches.  Use the albuterol inhaler, 2 puffs 4 times a day.  Start the prednisone prescription tomorrow.  You may purchase a portable pulse oximeter at any drugstore or [employer].  Use this to check your oxygen level at home.  If your oxygen level consistently stays below 88 to 89%, please return to the emergency department.

## 2020-09-24 ENCOUNTER — Telehealth: Payer: Self-pay

## 2020-09-24 ENCOUNTER — Other Ambulatory Visit: Payer: Self-pay

## 2020-09-24 ENCOUNTER — Ambulatory Visit (INDEPENDENT_AMBULATORY_CARE_PROVIDER_SITE_OTHER): Payer: Medicare HMO | Admitting: Family Medicine

## 2020-09-24 DIAGNOSIS — J4521 Mild intermittent asthma with (acute) exacerbation: Secondary | ICD-10-CM

## 2020-09-24 DIAGNOSIS — U071 COVID-19: Secondary | ICD-10-CM

## 2020-09-24 DIAGNOSIS — J1282 Pneumonia due to coronavirus disease 2019: Secondary | ICD-10-CM | POA: Diagnosis not present

## 2020-09-24 DIAGNOSIS — J189 Pneumonia, unspecified organism: Secondary | ICD-10-CM | POA: Diagnosis not present

## 2020-09-24 MED ORDER — AMOXICILLIN-POT CLAVULANATE 875-125 MG PO TABS: 1.0000 | ORAL_TABLET | Freq: Two times a day (BID) | ORAL | 0 refills | Status: DC

## 2020-09-24 MED ORDER — PREDNISONE 20 MG PO TABS: ORAL_TABLET | ORAL | 0 refills | Status: DC

## 2020-09-24 NOTE — Telephone Encounter (Signed)
[name] left voice message that she is calling about the appt Friday where she was seen she is no better struggling to breath she said she does not want to go to the ER and sit by her self but she is wanting to know from Dr Nicki Reaper what she needs to do.  Pt call back [phone]

## 2020-09-24 NOTE — Telephone Encounter (Signed)
Consult with Dr Nicki Reaper: Patient coming to the office to be seen today by Dr Nicki Reaper  at 3:30pm

## 2020-09-24 NOTE — Progress Notes (Signed)
   Subjective:    Patient ID: [name], female    DOB: [birth year], 65 y.o.   MRN: [id number]  HPI  Patient arrives for a follow up on Covid pneumonia. Patient states she is feeling terrible and having SOB and o2 sats running around 88 per patient Patient now has 2 weeks worth of coughing congestion wheezing fatigue tiredness feeling rundown.  Was seen in the ER x-ray showed Covid pneumonia patient relates coughing wheezing feeling short of breath especially with moving around able to eat and drink okay relates she is coughing up a lot of yellow-green discolored phlegm her husband brings her today Review of Systems Please see above    Objective:   Physical Exam Bilateral expiratory wheezes are noted.  Heart regular extremities no edema skin warm dry Patient does not want to go back to the ER O2 saturation 90% Patient is able to converse without feeling short winded.  Does not appear toxic.  She certainly looks like she is feeling ill but not catastrophic currently.  Condition is guarded because of underlying health issues.     Assessment & Plan:  Reactive airway Flareup of reactive airway Covid pneumonia Augmentin 875 twice daily for 10 days She was encouraged to monitor her O2 sats if they staying in the 80s she needs to go to the ER she is use albuterol every 2-4 hours and use of prednisone taper over the next 6 days she is to recheck in 2 days

## 2020-09-26 ENCOUNTER — Other Ambulatory Visit: Payer: Self-pay

## 2020-09-26 ENCOUNTER — Ambulatory Visit (INDEPENDENT_AMBULATORY_CARE_PROVIDER_SITE_OTHER): Payer: Medicare HMO | Admitting: Family Medicine

## 2020-09-26 DIAGNOSIS — U071 COVID-19: Secondary | ICD-10-CM

## 2020-09-26 DIAGNOSIS — J1282 Pneumonia due to coronavirus disease 2019: Secondary | ICD-10-CM | POA: Diagnosis not present

## 2020-09-26 NOTE — Progress Notes (Signed)
   Subjective:    Patient ID: [name], female    DOB: 08-13-1955, 65 y.o.   MRN: [id number]  HPI Patient comes in today for recheck.  Having some ongoing coughing congestion wheezing but O2 saturations staying in the low 90s denies high fever chills does relate some wheezing and coughing   Review of Systems Please see above    Objective:   Physical Exam  Bilateral expiratory wheezes no respiratory distress heart regular      Assessment & Plan:  COVID Covid pneumonia Reactive airway Secondary acute rhinosinusitis Continue antibiotics Finish out prednisone taper Albuterol as needed If progressive symptoms or worsening illness go to ER Follow-up sooner if problems Gradually making improvement

## 2020-10-05 ENCOUNTER — Other Ambulatory Visit: Payer: Self-pay | Admitting: Family Medicine

## 2020-10-05 DIAGNOSIS — B9689 Other specified bacterial agents as the cause of diseases classified elsewhere: Secondary | ICD-10-CM

## 2020-10-05 DIAGNOSIS — J019 Acute sinusitis, unspecified: Secondary | ICD-10-CM

## 2020-10-10 ENCOUNTER — Other Ambulatory Visit: Payer: Self-pay | Admitting: Family Medicine

## 2020-10-11 ENCOUNTER — Encounter (HOSPITAL_COMMUNITY): Payer: Self-pay | Admitting: Pharmacy Technician

## 2020-10-11 ENCOUNTER — Emergency Department (HOSPITAL_COMMUNITY)
Admission: EM | Admit: 2020-10-11 | Discharge: 2020-10-11 | Disposition: A | Discharge status: Home / Self Care | Payer: Medicare HMO | Attending: Emergency Medicine | Admitting: Emergency Medicine

## 2020-10-11 ENCOUNTER — Ambulatory Visit: Payer: Medicare HMO | Admitting: Family Medicine

## 2020-10-11 ENCOUNTER — Emergency Department (HOSPITAL_COMMUNITY): Payer: Medicare HMO

## 2020-10-11 ENCOUNTER — Telehealth: Payer: Self-pay | Admitting: *Deleted

## 2020-10-11 ENCOUNTER — Other Ambulatory Visit: Payer: Self-pay

## 2020-10-11 DIAGNOSIS — R0602 Shortness of breath: Secondary | ICD-10-CM | POA: Diagnosis not present

## 2020-10-11 DIAGNOSIS — Z8616 Personal history of COVID-19: Secondary | ICD-10-CM | POA: Diagnosis not present

## 2020-10-11 DIAGNOSIS — Z5321 Procedure and treatment not carried out due to patient leaving prior to being seen by health care provider: Secondary | ICD-10-CM | POA: Insufficient documentation

## 2020-10-11 DIAGNOSIS — J811 Chronic pulmonary edema: Secondary | ICD-10-CM | POA: Diagnosis not present

## 2020-10-11 LAB — BASIC METABOLIC PANEL
Anion gap: 8 (ref 5–15)
BUN: 8 mg/dL (ref 8–23)
CO2: 25 mmol/L (ref 22–32)
Calcium: 9.1 mg/dL (ref 8.9–10.3)
Chloride: 105 mmol/L (ref 98–111)
Creatinine, Ser: 0.91 mg/dL (ref 0.44–1.00)
GFR, Estimated: >60 mL/min (ref >60)
Glucose, Bld: 129 mg/dL — ABNORMAL HIGH (ref 70–99)
Potassium: 4 mmol/L (ref 3.5–5.1)
Sodium: 138 mmol/L (ref 135–145)

## 2020-10-11 LAB — CBC
HCT: 43.2 % (ref 36.0–46.0)
Hemoglobin: 13.2 g/dL (ref 12.0–15.0)
MCH: 28.1 pg (ref 26.0–34.0)
MCHC: 30.6 g/dL (ref 30.0–36.0)
MCV: 91.9 fL (ref 80.0–100.0)
Platelets: 365 10*3/uL (ref 150–400)
RBC: 4.7 MIL/uL (ref 3.87–5.11)
RDW: 16.1 % — ABNORMAL HIGH (ref 11.5–15.5)
WBC: 14 10*3/uL — ABNORMAL HIGH (ref 4.0–10.5)
nRBC: 0 % (ref 0.0–0.2)

## 2020-10-11 NOTE — ED Notes (Addendum)
Pt asked me to call her sister, when I did both pt and sister are agitated at each other. Pt is aware that her o2 states are low. However she still wants to leave without being seen.  I told her I wish she would stay and talk to a Doctor she refused and left

## 2020-10-11 NOTE — Telephone Encounter (Signed)
90-day with 1 refill recommend standard follow-up visit by early summer

## 2020-10-11 NOTE — Telephone Encounter (Signed)
Patient states she is still having significant shortness of breath and trouble breathing s/p Covid pneumonia. Patient states at rest her O2 sats run upper 80s to 90 but if she stands up and takes a few steps it drops in the 70s. Patient advised to go to ER immediately for evaluation and treatment and patient agreed and stated she would be going to [facility] ER due to her history of heart issues.

## 2020-10-11 NOTE — ED Triage Notes (Signed)
Pt here with shob and low O2 saturations for the last 4-5 days. Pt reports dx with covid approx 3 weeks ago and was treated for covid PNA. Pt reports oxygen saturations falling as low as the 60's at home.

## 2020-10-11 NOTE — ED Notes (Signed)
Pt placed on 2L [place]

## 2020-10-12 ENCOUNTER — Observation Stay (HOSPITAL_COMMUNITY)
Admission: EM | Admit: 2020-10-12 | Discharge: 2020-10-13 | Disposition: A | Discharge status: Home / Self Care | Payer: Medicare HMO | Attending: Internal Medicine | Admitting: Internal Medicine

## 2020-10-12 ENCOUNTER — Telehealth: Payer: Self-pay | Admitting: Family Medicine

## 2020-10-12 ENCOUNTER — Emergency Department (HOSPITAL_COMMUNITY): Payer: Medicare HMO

## 2020-10-12 ENCOUNTER — Encounter (HOSPITAL_COMMUNITY): Payer: Self-pay | Admitting: Family Medicine

## 2020-10-12 DIAGNOSIS — Z955 Presence of coronary angioplasty implant and graft: Secondary | ICD-10-CM | POA: Diagnosis not present

## 2020-10-12 DIAGNOSIS — I517 Cardiomegaly: Secondary | ICD-10-CM | POA: Diagnosis not present

## 2020-10-12 DIAGNOSIS — Z7902 Long term (current) use of antithrombotics/antiplatelets: Secondary | ICD-10-CM | POA: Diagnosis not present

## 2020-10-12 DIAGNOSIS — Z8616 Personal history of COVID-19: Secondary | ICD-10-CM | POA: Insufficient documentation

## 2020-10-12 DIAGNOSIS — I10 Essential (primary) hypertension: Secondary | ICD-10-CM | POA: Diagnosis not present

## 2020-10-12 DIAGNOSIS — E039 Hypothyroidism, unspecified: Secondary | ICD-10-CM | POA: Diagnosis not present

## 2020-10-12 DIAGNOSIS — J45909 Unspecified asthma, uncomplicated: Secondary | ICD-10-CM | POA: Insufficient documentation

## 2020-10-12 DIAGNOSIS — J9601 Acute respiratory failure with hypoxia: Secondary | ICD-10-CM | POA: Diagnosis not present

## 2020-10-12 DIAGNOSIS — U071 COVID-19: Secondary | ICD-10-CM | POA: Diagnosis not present

## 2020-10-12 DIAGNOSIS — E876 Hypokalemia: Secondary | ICD-10-CM

## 2020-10-12 DIAGNOSIS — F17210 Nicotine dependence, cigarettes, uncomplicated: Secondary | ICD-10-CM | POA: Insufficient documentation

## 2020-10-12 DIAGNOSIS — K219 Gastro-esophageal reflux disease without esophagitis: Secondary | ICD-10-CM | POA: Diagnosis not present

## 2020-10-12 DIAGNOSIS — Z79899 Other long term (current) drug therapy: Secondary | ICD-10-CM | POA: Diagnosis not present

## 2020-10-12 DIAGNOSIS — I2510 Atherosclerotic heart disease of native coronary artery without angina pectoris: Secondary | ICD-10-CM | POA: Insufficient documentation

## 2020-10-12 DIAGNOSIS — J8410 Pulmonary fibrosis, unspecified: Secondary | ICD-10-CM | POA: Diagnosis not present

## 2020-10-12 DIAGNOSIS — K449 Diaphragmatic hernia without obstruction or gangrene: Secondary | ICD-10-CM | POA: Diagnosis not present

## 2020-10-12 DIAGNOSIS — I2699 Other pulmonary embolism without acute cor pulmonale: Secondary | ICD-10-CM | POA: Diagnosis not present

## 2020-10-12 DIAGNOSIS — E038 Other specified hypothyroidism: Secondary | ICD-10-CM | POA: Diagnosis not present

## 2020-10-12 DIAGNOSIS — R0602 Shortness of breath: Secondary | ICD-10-CM | POA: Diagnosis not present

## 2020-10-12 DIAGNOSIS — Z720 Tobacco use: Secondary | ICD-10-CM

## 2020-10-12 DIAGNOSIS — R059 Cough, unspecified: Secondary | ICD-10-CM | POA: Diagnosis not present

## 2020-10-12 DIAGNOSIS — J1282 Pneumonia due to coronavirus disease 2019: Secondary | ICD-10-CM | POA: Diagnosis not present

## 2020-10-12 DIAGNOSIS — R918 Other nonspecific abnormal finding of lung field: Secondary | ICD-10-CM

## 2020-10-12 DIAGNOSIS — I252 Old myocardial infarction: Secondary | ICD-10-CM | POA: Insufficient documentation

## 2020-10-12 DIAGNOSIS — R0902 Hypoxemia: Secondary | ICD-10-CM

## 2020-10-12 LAB — CBC WITH DIFFERENTIAL/PLATELET
Abs Immature Granulocytes: 0.05 10*3/uL (ref 0.00–0.07)
Basophils Absolute: 0.1 10*3/uL (ref 0.0–0.1)
Basophils Relative: 0 %
Eosinophils Absolute: 0.5 10*3/uL (ref 0.0–0.5)
Eosinophils Relative: 4 %
HCT: 42 % (ref 36.0–46.0)
Hemoglobin: 12.8 g/dL (ref 12.0–15.0)
Immature Granulocytes: 0 %
Lymphocytes Relative: 16 %
Lymphs Abs: 2 10*3/uL (ref 0.7–4.0)
MCH: 28.2 pg (ref 26.0–34.0)
MCHC: 30.5 g/dL (ref 30.0–36.0)
MCV: 92.5 fL (ref 80.0–100.0)
Monocytes Absolute: 1.2 10*3/uL — ABNORMAL HIGH (ref 0.1–1.0)
Monocytes Relative: 10 %
Neutro Abs: 8.8 10*3/uL — ABNORMAL HIGH (ref 1.7–7.7)
Neutrophils Relative %: 70 %
Platelets: 341 10*3/uL (ref 150–400)
RBC: 4.54 MIL/uL (ref 3.87–5.11)
RDW: 16.2 % — ABNORMAL HIGH (ref 11.5–15.5)
WBC: 12.5 10*3/uL — ABNORMAL HIGH (ref 4.0–10.5)
nRBC: 0 % (ref 0.0–0.2)

## 2020-10-12 LAB — COMPREHENSIVE METABOLIC PANEL
ALT: 19 U/L (ref 0–44)
AST: 16 U/L (ref 15–41)
Albumin: 2.7 g/dL — ABNORMAL LOW (ref 3.5–5.0)
Alkaline Phosphatase: 102 U/L (ref 38–126)
Anion gap: 12 (ref 5–15)
BUN: 11 mg/dL (ref 8–23)
CO2: 23 mmol/L (ref 22–32)
Calcium: 8.9 mg/dL (ref 8.9–10.3)
Chloride: 103 mmol/L (ref 98–111)
Creatinine, Ser: 0.84 mg/dL (ref 0.44–1.00)
GFR, Estimated: >60 mL/min (ref >60)
Glucose, Bld: 106 mg/dL — ABNORMAL HIGH (ref 70–99)
Potassium: 2.9 mmol/L — ABNORMAL LOW (ref 3.5–5.1)
Sodium: 138 mmol/L (ref 135–145)
Total Bilirubin: 0.6 mg/dL (ref 0.3–1.2)
Total Protein: 7.3 g/dL (ref 6.5–8.1)

## 2020-10-12 LAB — FIBRINOGEN: Fibrinogen: 712 mg/dL — ABNORMAL HIGH (ref 210–475)

## 2020-10-12 LAB — FERRITIN: Ferritin: 53 ng/mL (ref 11–307)

## 2020-10-12 LAB — LACTIC ACID, PLASMA
Lactic Acid, Venous: 1.8 mmol/L (ref 0.5–1.9)
Lactic Acid, Venous: 1.3 mmol/L (ref 0.5–1.9)

## 2020-10-12 LAB — LACTATE DEHYDROGENASE: LDH: 256 U/L — ABNORMAL HIGH (ref 98–192)

## 2020-10-12 LAB — C-REACTIVE PROTEIN: CRP: 11.7 mg/dL — ABNORMAL HIGH (ref <1.0)

## 2020-10-12 LAB — PROCALCITONIN: Procalcitonin: <0.1 ng/mL

## 2020-10-12 LAB — D-DIMER, QUANTITATIVE: D-Dimer, Quant: 1.19 ug/mL-FEU — ABNORMAL HIGH (ref 0.00–0.50)

## 2020-10-12 LAB — TRIGLYCERIDES: Triglycerides: 83 mg/dL (ref <150)

## 2020-10-12 MED ORDER — POTASSIUM CHLORIDE CRYS ER 20 MEQ PO TBCR
40.0000 meq | EXTENDED_RELEASE_TABLET | Freq: Once | ORAL | Status: AC
  Administered 2020-10-12: 40 meq via ORAL
  Filled 2020-10-12: qty 2

## 2020-10-12 MED ORDER — ISOSORBIDE MONONITRATE ER 30 MG PO TB24
15.0000 mg | ORAL_TABLET | Freq: Every day | ORAL | Status: DC
  Administered 2020-10-13: 15 mg via ORAL
  Filled 2020-10-12: qty 1

## 2020-10-12 MED ORDER — TIZANIDINE HCL 4 MG PO TABS
4.0000 mg | ORAL_TABLET | Freq: Every day | ORAL | Status: DC
Start: 2020-10-13 — End: 2020-10-13
  Administered 2020-10-13: 4 mg via ORAL
  Filled 2020-10-12: qty 1

## 2020-10-12 MED ORDER — CLOPIDOGREL BISULFATE 75 MG PO TABS
75.0000 mg | ORAL_TABLET | Freq: Every day | ORAL | Status: DC
Start: 2020-10-13 — End: 2020-10-13
  Administered 2020-10-13: 75 mg via ORAL
  Filled 2020-10-12: qty 1

## 2020-10-12 MED ORDER — DEXAMETHASONE SODIUM PHOSPHATE 10 MG/ML IJ SOLN
10.0000 mg | Freq: Once | INTRAMUSCULAR | Status: AC
  Administered 2020-10-12: 10 mg via INTRAVENOUS
  Filled 2020-10-12: qty 1

## 2020-10-12 MED ORDER — FLUOXETINE HCL 20 MG PO CAPS
60.0000 mg | ORAL_CAPSULE | Freq: Every day | ORAL | Status: DC
  Administered 2020-10-13: 60 mg via ORAL
  Filled 2020-10-12: qty 3

## 2020-10-12 MED ORDER — AMITRIPTYLINE HCL 25 MG PO TABS
25.0000 mg | ORAL_TABLET | Freq: Every day | ORAL | Status: DC
  Administered 2020-10-12: 25 mg via ORAL
  Filled 2020-10-12: qty 1

## 2020-10-12 MED ORDER — SODIUM CHLORIDE 0.9 % IV SOLN
2.0000 g | Freq: Once | INTRAVENOUS | Status: AC
  Administered 2020-10-12: 2 g via INTRAVENOUS
  Filled 2020-10-12: qty 20

## 2020-10-12 MED ORDER — ENOXAPARIN SODIUM 40 MG/0.4ML ~~LOC~~ SOLN
40.0000 mg | SUBCUTANEOUS | Status: DC
  Administered 2020-10-12: 40 mg via SUBCUTANEOUS
  Filled 2020-10-12: qty 0.4

## 2020-10-12 MED ORDER — ZINC SULFATE 220 (50 ZN) MG PO CAPS
220.0000 mg | ORAL_CAPSULE | Freq: Every day | ORAL | Status: DC
  Administered 2020-10-13: 220 mg via ORAL
  Filled 2020-10-12: qty 1

## 2020-10-12 MED ORDER — GUAIFENESIN-DM 100-10 MG/5ML PO SYRP: 10.0000 mL | ORAL_SOLUTION | ORAL | Status: DC | PRN

## 2020-10-12 MED ORDER — SODIUM CHLORIDE 0.9 % IV SOLN
500.0000 mg | INTRAVENOUS | Status: DC
  Administered 2020-10-12: 500 mg via INTRAVENOUS
  Filled 2020-10-12: qty 500

## 2020-10-12 MED ORDER — HYDROCOD POLST-CPM POLST ER 10-8 MG/5ML PO SUER: 5.0000 mL | Freq: Two times a day (BID) | ORAL | Status: DC | PRN

## 2020-10-12 MED ORDER — ALPRAZOLAM 0.5 MG PO TABS: 0.5000 mg | ORAL_TABLET | Freq: Every evening | ORAL | Status: DC | PRN

## 2020-10-12 MED ORDER — ONDANSETRON HCL 4 MG PO TABS: 4.0000 mg | ORAL_TABLET | Freq: Four times a day (QID) | ORAL | Status: DC | PRN

## 2020-10-12 MED ORDER — ALBUTEROL SULFATE HFA 108 (90 BASE) MCG/ACT IN AERS: 2.0000 | INHALATION_SPRAY | Freq: Four times a day (QID) | RESPIRATORY_TRACT | Status: DC | PRN

## 2020-10-12 MED ORDER — KETOROLAC TROMETHAMINE 0.5 % OP SOLN
1.0000 [drp] | Freq: Every day | OPHTHALMIC | Status: DC
  Filled 2020-10-12: qty 3

## 2020-10-12 MED ORDER — LEVOTHYROXINE SODIUM 25 MCG PO TABS
125.0000 ug | ORAL_TABLET | Freq: Every day | ORAL | Status: DC
  Administered 2020-10-13: 125 ug via ORAL
  Filled 2020-10-12: qty 1

## 2020-10-12 MED ORDER — LIDOCAINE 5 % EX PTCH: 1.0000 | MEDICATED_PATCH | Freq: Every day | CUTANEOUS | Status: DC | PRN

## 2020-10-12 MED ORDER — SODIUM CHLORIDE 0.9 % IV SOLN: 1.0000 g | Freq: Once | INTRAVENOUS | Status: DC

## 2020-10-12 MED ORDER — DEXAMETHASONE 4 MG PO TABS: 6.0000 mg | ORAL_TABLET | ORAL | Status: DC

## 2020-10-12 MED ORDER — ONDANSETRON HCL 4 MG/2ML IJ SOLN: 4.0000 mg | Freq: Four times a day (QID) | INTRAMUSCULAR | Status: DC | PRN

## 2020-10-12 MED ORDER — ASCORBIC ACID 500 MG PO TABS
500.0000 mg | ORAL_TABLET | Freq: Every day | ORAL | Status: DC
  Administered 2020-10-13: 500 mg via ORAL
  Filled 2020-10-12: qty 1

## 2020-10-12 MED ORDER — IOHEXOL 350 MG/ML SOLN
100.0000 mL | Freq: Once | INTRAVENOUS | Status: AC | PRN
  Administered 2020-10-12: 100 mL via INTRAVENOUS

## 2020-10-12 MED ORDER — ATORVASTATIN CALCIUM 40 MG PO TABS
40.0000 mg | ORAL_TABLET | Freq: Every day | ORAL | Status: DC
  Administered 2020-10-13: 40 mg via ORAL
  Filled 2020-10-12: qty 1

## 2020-10-12 MED ORDER — HYDROCODONE-ACETAMINOPHEN 5-325 MG PO TABS
1.0000 | ORAL_TABLET | Freq: Four times a day (QID) | ORAL | Status: DC | PRN
Start: 2020-10-12 — End: 2020-10-13

## 2020-10-12 MED ORDER — POTASSIUM CHLORIDE CRYS ER 20 MEQ PO TBCR
20.0000 meq | EXTENDED_RELEASE_TABLET | Freq: Two times a day (BID) | ORAL | Status: DC
  Administered 2020-10-12 – 2020-10-13 (×2): 20 meq via ORAL
  Filled 2020-10-12 (×2): qty 1

## 2020-10-12 MED ORDER — PANTOPRAZOLE SODIUM 40 MG PO TBEC
40.0000 mg | DELAYED_RELEASE_TABLET | Freq: Every day | ORAL | Status: DC
Start: 2020-10-13 — End: 2020-10-13
  Administered 2020-10-13: 40 mg via ORAL
  Filled 2020-10-12: qty 1

## 2020-10-12 MED ORDER — SODIUM CHLORIDE 0.9 % IV SOLN
1000.0000 mL | INTRAVENOUS | Status: DC
  Administered 2020-10-12: 1000 mL via INTRAVENOUS

## 2020-10-12 NOTE — Telephone Encounter (Signed)
Pt contacted. Pt advised that the wise choice would be going to ER to be fully evaluated. Pt verbalized understanding and will go to [facility]. Pt will have sister take her to ER. Attempted to contacted [facility] Triage three times; no answer either time.

## 2020-10-12 NOTE — ED Provider Notes (Signed)
Pt signed out by Dr. Rogene Houston pending CTA results.    IMPRESSION:  1. Negative examination for pulmonary embolism.  2. There is moderate to severe pulmonary fibrosis in a pattern with  apical to basal gradient featuring irregular peripheral interstitial  opacity, septal thickening, traction bronchiectasis, areas of  subpleural bronchiolectasis and honeycombing. Findings are  consistent with a UIP pattern of fibrosis by ATS pulmonary fibrosis  criteria, and are markedly worsened in comparison to the included  lung bases on CT of the abdomen pelvis dated 10/24/2015.  3. There are scattered areas of acutely superimposed ground-glass  and heterogeneous airspace opacity, consistent with infection or  acute inflammatory flare of fibrotic interstitial lung disease.  4. There is a spiculated nodule of the peripheral left upper lobe  measuring 1.9 x 1.8 cm, highly concerning for primary lung  malignancy in the high risk setting of pulmonary fibrosis. Recommend  multi disciplinary referral on a nonemergent basis for consideration  of PET metabolic characterization and tissue sampling.  5. There are enlarged bilateral hilar, mediastinal, and prevascular  lymph nodes, nonspecific and possibly reactive in the setting of  fibrosis.  6. Coronary artery disease.      Pt is feeling better with the oxygen in her nose.  I told pt of the results of her CT scan.  As she is hypoxic with movement, she is continued on oxygen at 2L via [place].  Pt's K is low, and is she is given Kdur 40 meq.  Pt is given rocephin/zithromax and decadron.  Covid admission orders placed.  Pt d/w Dr. Nehemiah Settle (triad) for admission.  [name] was evaluated in Emergency Department on 10/12/2020 for the symptoms described in the history of present illness. She was evaluated in the context of the global COVID-19 pandemic, which necessitated consideration that the patient might be at risk for infection with the SARS-CoV-2 virus  that causes COVID-19. Institutional protocols and algorithms that pertain to the evaluation of patients at risk for COVID-19 are in a state of rapid change based on information released by regulatory bodies including the CDC and federal and state organizations. These policies and algorithms were followed during the patient's care in the ED.  CRITICAL CARE Performed by: Isla Pence   Total critical care time: 30 minutes  Critical care time was exclusive of separately billable procedures and treating other patients.  Critical care was necessary to treat or prevent imminent or life-threatening deterioration.  Critical care was time spent personally by me on the following activities: development of treatment plan with patient and/or surrogate as well as nursing, discussions with consultants, evaluation of patient's response to treatment, examination of patient, obtaining history from patient or surrogate, ordering and performing treatments and interventions, ordering and review of laboratory studies, ordering and review of radiographic studies, pulse oximetry and re-evaluation of patient's condition.    Isla Pence, MD 10/12/20 [phone]

## 2020-10-12 NOTE — ED Notes (Addendum)
Entered room and introduced self to patient. Pt appears to be resting in bed, respirations are even and unlabored with equal chest rise and fall. Bed is locked in the lowest position, side rails x2, call bell within reach. Pt educated on call light use and hourly rounding, verbalized understanding and in agreement at this time. All questions and concerns voiced addressed. Refreshments offered and provided per patient request. Cardiac monitor in place with vital signs cycling q30 minutes.   Pt placed on 2L [place] for room SpO2 89-90% on room air. This RN notes an improvement in oxygen to 94-97%.

## 2020-10-12 NOTE — ED Notes (Signed)
Pt provided with a warm blanket and ice chips for oral care at this time.

## 2020-10-12 NOTE — Telephone Encounter (Signed)
This is a less than ideal situation Unfortunately emergency departments are quite inundated with a large volume of patients I looked at her EKG it has significant changes bilateral which could even be a condition known is pulmonary edema which is quite dangerous versus atypical pneumonia If she was further evaluated in the ER they could help figure this out and decide whether or not to admit her or treat her as an outpatient She has 2 choices Go back to ER-potentially [facility] would be less busy-I believe that would be the wisest choice but if patient refuses to go to ER then the following Or I am willing to see her this afternoon at 3 PM but the patient would need to go ahead of time to get BNP, troponin, met 7 through the lab stat preferably labs done at least 90 minutes before visit

## 2020-10-12 NOTE — H&P (Signed)
History and Physical  [name] UEA:[id number] DOB: January 29, 1956 DOA: 10/12/2020  Referring physician: Dr Gilford Raid, ED physician PCP: Kathyrn Drown, MD  Outpatient Specialists:   Patient Coming From: home  Chief Complaint: SOB, cough  HPI: [name] is a 65 y.o. female with a history of coronary artery disease status post stenting, hypertension, GERD, fibromyalgia, history of NSTEMI, 50 years of smoking.  Patient seen for shortness of breath that has been increasing over the past couple of weeks, particularly worse over the past 3 days.  Shortness of breath worse with exertion and improved with rest.  She has been getting hypoxic after just a few steps and it takes her 5 to 10 minutes to recover.  She was diagnosed with COVID 2/18 with a cough, but no particular shortness of breath.  Emergency Department Course: On presentation, her oxygen saturation was approximately 88%.  Elevated at 12.5.  CRP 11.7.  Potassium 2.9, calcitonin less than 1.1.  Patient given Rocephin and azithromycin.  Chest was done showing moderate to severe pulmonary fibrosis, 1.9 x 1.8 spiculated nodule in the left upper lobe concerning for primary lung cancer, enlarged bilateral hilar, mediastinal, prevascular lymph nodes, and diffuse multifocal pneumonia.  Review of Systems:    Pt denies any fevers, chills, nausea, vomiting, diarrhea, constipation, abdominal pain, shortness of breath, dyspnea on exertion, orthopnea, cough, wheezing, palpitations, headache, vision changes, lightheadedness, dizziness, melena, rectal bleeding.  Review of systems are otherwise negative  Past Medical History:  Diagnosis Date  . Anxiety   . Arthritis   . Asthma   . Coronary atherosclerosis of native coronary artery    a. s/p DES to LAD and D1 in 2005 b. cath in 11/2015 showing patent stents with mild disease along RCA and LCx  . Depression    Prior suicide attempt  . Essential hypertension   . Fibromyalgia   . GERD  (gastroesophageal reflux disease)   . History of blood transfusion 2005  . History of kidney stones   . Hyperlipidemia   . Hypothyroidism   . Myocardial infarction ([place]) 2017  . NSTEMI (non-ST elevated myocardial infarction) ([place]) 2005  . Palpitations    PVC's have been noted  . Sleep apnea    Past Surgical History:  Procedure Laterality Date  . CARDIAC CATHETERIZATION  ~ 2009/2010  . CARDIAC CATHETERIZATION N/A 11/21/2015   Procedure: Left Heart Cath and Coronary Angiography;  Surgeon: Burnell Blanks, MD;  Location: [facility];  Service: Cardiovascular;  Laterality: N/A;  . CARDIAC CATHETERIZATION  10/21/2017  . CARPAL TUNNEL RELEASE Bilateral 2007- 2008  . CORONARY ANGIOPLASTY WITH STENT PLACEMENT  2005  . FOOT BONE EXCISION     Sesmoid bone left foot-repair of fracture  . LEFT HEART CATH AND CORONARY ANGIOGRAPHY N/A 10/21/2017   Procedure: LEFT HEART CATH AND CORONARY ANGIOGRAPHY;  Surgeon: Wellington Hampshire, MD;  Location: [facility];  Service: Cardiovascular;  Laterality: N/A;  . TUBAL LIGATION     Social History:  reports that she has been smoking cigarettes. She started smoking about 51 years ago. She has a 16.17 pack-year smoking history. She has never used smokeless tobacco. She reports that she does not drink alcohol and does not use drugs. Patient lives at home  Allergies  Allergen Reactions  . Cymbalta [Duloxetine Hcl] Other (See Comments)    Felt bad  . Lyrica [Pregabalin] Other (See Comments)    Retained fluids    Family History  Problem Relation Age  of Onset  . Coronary artery disease Father        MI age 67  . Heart failure Father   . Heart failure Mother   . Anesthesia problems Neg Hx   . Hypotension Neg Hx   . Malignant hyperthermia Neg Hx   . Pseudochol deficiency Neg Hx       Prior to Admission medications   Medication Sig Start Date End Date Taking? Authorizing Provider  isosorbide mononitrate (IMDUR) 30 MG 24 hr tablet TAKE  1/2 TABLET ONE TIME DAILY. Needs visit by early summer 10/11/20   Kathyrn Drown, MD  levothyroxine (SYNTHROID) 125 MCG tablet TAKE 1/2 TABLET ON SUNDAYS AND ONE TABLET ALL OTHER DAYS. Needs visit by early summer. 10/11/20   Kathyrn Drown, MD  pantoprazole (PROTONIX) 40 MG tablet TAKE 1 TABLET EVERY DAY. Needs visit by early summer 10/11/20   Kathyrn Drown, MD  albuterol (VENTOLIN HFA) 108 (90 Base) MCG/ACT inhaler Inhale 2 puffs into the lungs every 6 (six) hours as needed for wheezing or shortness of breath. 06/26/20   Chalmers Guest, NP  ALPRAZolam Duanne Moron) 0.5 MG tablet Take one tablet daily as needed for anxiety. 06/27/20   Mozingo, Berdie Ogren, NP  amitriptyline (ELAVIL) 25 MG tablet Take 1 tablet by mouth at bedtime. 03/17/19   [provider]  amoxicillin (AMOXIL) 500 MG capsule Take 1 capsule (500 mg total) by mouth 3 (three) times daily. Patient not taking: Reported on 09/24/2020 07/17/20   Chalmers Guest, NP  amoxicillin-clavulanate (AUGMENTIN) 875-125 MG tablet Take 1 tablet by mouth 2 (two) times daily. Patient not taking: Reported on 10/12/2020 09/24/20   Kathyrn Drown, MD  atorvastatin (LIPITOR) 40 MG tablet TAKE 1 TABLET EVERY DAY (STOP THE 80MG ) 07/17/20   Chalmers Guest, NP  benzonatate (TESSALON) 200 MG capsule Take 1 capsule (200 mg total) by mouth 3 (three) times daily as needed for cough. Swallow whole, do not chew Patient not taking: Reported on 10/12/2020 09/21/20   Kem Parkinson, PA-C  Biotin 10000 MCG TABS Take 1 tablet by mouth daily.    [provider]  clopidogrel (PLAVIX) 75 MG tablet TAKE 1 TABLET EVERY DAY 06/18/20   Kathyrn Drown, MD  Cyanocobalamin (VITAMIN B-12 PO) Take 1 tablet by mouth daily.    [provider]  FLUoxetine (PROZAC) 20 MG capsule Take 3 capsule po daily 07/17/20   Chalmers Guest, NP  HYDROcodone-acetaminophen (NORCO/VICODIN) 5-325 MG tablet Take 1 tablet by mouth every 6 (six) hours as needed for moderate pain.     [provider]  lidocaine (LIDODERM) 5 % Place 1 patch onto the skin daily as needed. 03/07/19   [provider]  metoprolol succinate (TOPROL-XL) 50 MG 24 hr tablet TAKE 1 TABLET (50 MG TOTAL) BY MOUTH DAILY. TAKE WITH OR IMMEDIATELY FOLLOWING A MEAL. 02/07/20 05/07/20  Satira Sark, MD  nitroGLYCERIN (NITROSTAT) 0.4 MG SL tablet DISSOLVE 1 TABLET UNDER TONGUE EVERY 5 MINUTES FOR UP TO 3 DOSES AS NEEDED FOR CHEST PAIN. 05/16/19   Kathyrn Drown, MD  predniSONE (DELTASONE) 20 MG tablet 3 qd for 2d then 2qd for 2d then 1qd for 2d 09/24/20   Luking, Elayne Snare, MD  PROLENSA 0.07 % SOLN Place 1 drop into the right eye at bedtime. 08/18/19   [provider]  tiZANidine (ZANAFLEX) 4 MG tablet Take 1-2 tablets by mouth daily. 03/17/19   [provider]    Physical Exam: BP  112/67 (BP Location: Left Arm)   Pulse 66   Temp 97.7 F (36.5 C) (Oral)   Resp (!) 21   Ht 5\' 6"  (1.676 m)   Wt 106.6 kg   SpO2 99%   BMI 37.93 kg/m   . General: Elderly female. Awake and alert and oriented x3. No acute cardiopulmonary distress.  Marland Kitchen HEENT: Normocephalic atraumatic.  Right and left ears normal in appearance.  Pupils equal, round, reactive to light. Extraocular muscles are intact. Sclerae anicteric and noninjected.  Moist mucosal membranes. No mucosal lesions.  . Neck: Neck supple without lymphadenopathy. No carotid bruits. No masses palpated.  . Cardiovascular: Regular rate with normal S1-S2 sounds. No murmurs, rubs, gallops auscultated. No JVD.  Marland Kitchen Respiratory: Good respiratory effort with no wheezes, rales, rhonchi. Lungs clear to auscultation bilaterally.  No accessory muscle use. . Abdomen: Soft, nontender, nondistended. Active bowel sounds. No masses or hepatosplenomegaly  . Skin: No rashes, lesions, or ulcerations.  Dry, warm to touch. 2+ dorsalis pedis and radial pulses. . Musculoskeletal: No calf or leg pain. All major joints not erythematous nontender.  No upper or  lower joint deformation.  Good ROM.  No contractures  . Psychiatric: Intact judgment and insight. Pleasant and cooperative. . Neurologic: No focal neurological deficits. Strength is 5/5 and symmetric in upper and lower extremities.  Cranial nerves II through XII are grossly intact.           Labs on Admission: I have personally reviewed following labs and imaging studies  CBC: Recent Labs  Lab 10/11/20 1610 10/12/20 1434  WBC 14.0* 12.5*  NEUTROABS  --  8.8*  HGB 13.2 12.8  HCT 43.2 42.0  MCV 91.9 92.5  PLT 365 376   Basic Metabolic Panel: Recent Labs  Lab 10/11/20 1610 10/12/20 1434  NA 138 138  K 4.0 2.9*  CL 105 103  CO2 25 23  GLUCOSE 129* 106*  BUN 8 11  CREATININE 0.91 0.84  CALCIUM 9.1 8.9   GFR: Estimated Creatinine Clearance: 81.3 mL/min (by C-G formula based on SCr of 0.84 mg/dL). Liver Function Tests: Recent Labs  Lab 10/12/20 1434  AST 16  ALT 19  ALKPHOS 102  BILITOT 0.6  PROT 7.3  ALBUMIN 2.7*   No results for input(s): LIPASE, AMYLASE in the last 168 hours. No results for input(s): AMMONIA in the last 168 hours. Coagulation Profile: No results for input(s): INR, PROTIME in the last 168 hours. Cardiac Enzymes: No results for input(s): CKTOTAL, CKMB, CKMBINDEX, TROPONINI in the last 168 hours. BNP (last 3 results) No results for input(s): PROBNP in the last 8760 hours. HbA1C: No results for input(s): HGBA1C in the last 72 hours. CBG: No results for input(s): GLUCAP in the last 168 hours. Lipid Profile: Recent Labs    10/12/20 1710  TRIG 83   Thyroid Function Tests: No results for input(s): TSH, T4TOTAL, FREET4, T3FREE, THYROIDAB in the last 72 hours. Anemia Panel: Recent Labs    10/12/20 1434  FERRITIN 53   Urine analysis:    Component Value Date/Time   COLORURINE YELLOW 09/07/2008 0912   APPEARANCEUR CLEAR 09/07/2008 0912   LABSPEC <1.005 (L) 09/07/2008 0912   PHURINE 6.5 09/07/2008 0912   GLUCOSEU NEGATIVE 09/07/2008 0912    HGBUR NEGATIVE 09/07/2008 0912   BILIRUBINUR + 09/08/2017 0832   KETONESUR NEGATIVE 09/07/2008 0912   PROTEINUR NEGATIVE 09/07/2008 0912   UROBILINOGEN 0.2 09/07/2008 0912   NITRITE NEGATIVE 09/07/2008 0912   LEUKOCYTESUR  09/07/2008 2831  NEGATIVE MICROSCOPIC NOT DONE ON URINES WITH NEGATIVE PROTEIN, BLOOD, LEUKOCYTES, NITRITE, OR GLUCOSE <1000 mg/dL.   Sepsis Labs: @LABRCNTIP (procalcitonin:4,lacticidven:4) )No results found for this or any previous visit (from the past 240 hour(s)).   Radiological Exams on Admission: DG Chest 2 View  Result Date: 10/11/2020 CLINICAL DATA:  Shortness of breath EXAM: CHEST - 2 VIEW COMPARISON:  09/21/2020 FINDINGS: Stable cardiomediastinal silhouette. Atherosclerotic calcification of the aortic knob. Diffuse interstitial opacities bilaterally, most pronounced within the perihilar and bibasilar regions. No pleural effusion or pneumothorax. IMPRESSION: Diffuse interstitial opacities bilaterally, most pronounced within the perihilar and bibasilar regions. Findings suggestive of multifocal atypical/viral pneumonia. Pulmonary edema would have a similar appearance. Electronically Signed   By: Davina Poke D.O.   On: 10/11/2020 16:59   CT Angio Chest PE W/Cm &/Or Wo Cm  Result Date: 10/12/2020 CLINICAL DATA:  PE suspected EXAM: CT ANGIOGRAPHY CHEST WITH CONTRAST TECHNIQUE: Multidetector CT imaging of the chest was performed using the standard protocol during bolus administration of intravenous contrast. Multiplanar CT image reconstructions and MIPs were obtained to evaluate the vascular anatomy. CONTRAST:  111mL OMNIPAQUE IOHEXOL 350 MG/ML SOLN COMPARISON:  CT abdomen pelvis, 10/24/2015 FINDINGS: Cardiovascular: Satisfactory opacification of the pulmonary arteries to the segmental level. No evidence of pulmonary embolism. Mild cardiomegaly. Extensive 3 vessel coronary artery calcifications and/or stents. No pericardial effusion. Aortic atherosclerosis.  Mediastinum/Nodes: There are enlarged bilateral hilar, mediastinal, and prevascular lymph nodes, largest pretracheal nodes measuring 2.5 x 2.5 cm (series 5, image 34). Small hiatal hernia. Thyroid gland, trachea, and esophagus demonstrate no significant findings. Lungs/Pleura: There is moderate to severe pulmonary fibrosis in a pattern with apical to basal gradient featuring irregular peripheral interstitial opacity, septal thickening, traction bronchiectasis, areas of subpleural bronchiolectasis and honeycombing. There are scattered areas of superimposed ground-glass and heterogeneous airspace opacity. There is a spiculated nodule of the peripheral left upper lobe measuring 1.9 x 1.8 cm (series 7, image 48). No pleural effusion or pneumothorax. Upper Abdomen: No acute abnormality. Musculoskeletal: No chest wall abnormality. No acute or significant osseous findings. Review of the MIP images confirms the above findings. IMPRESSION: 1. Negative examination for pulmonary embolism. 2. There is moderate to severe pulmonary fibrosis in a pattern with apical to basal gradient featuring irregular peripheral interstitial opacity, septal thickening, traction bronchiectasis, areas of subpleural bronchiolectasis and honeycombing. Findings are consistent with a UIP pattern of fibrosis by ATS pulmonary fibrosis criteria, and are markedly worsened in comparison to the included lung bases on CT of the abdomen pelvis dated 10/24/2015. 3. There are scattered areas of acutely superimposed ground-glass and heterogeneous airspace opacity, consistent with infection or acute inflammatory flare of fibrotic interstitial lung disease. 4. There is a spiculated nodule of the peripheral left upper lobe measuring 1.9 x 1.8 cm, highly concerning for primary lung malignancy in the high risk setting of pulmonary fibrosis. Recommend multi disciplinary referral on a nonemergent basis for consideration of PET metabolic characterization and tissue  sampling. 5. There are enlarged bilateral hilar, mediastinal, and prevascular lymph nodes, nonspecific and possibly reactive in the setting of fibrosis. 6. Coronary artery disease. Aortic Atherosclerosis (ICD10-I70.0). Electronically Signed   By: Eddie Candle M.D.   On: 10/12/2020 16:05   DG Chest Port 1 View  Result Date: 10/12/2020 CLINICAL DATA:  Shortness of breath. Cough. COVID positive 09/21/2020 EXAM: PORTABLE CHEST 1 VIEW COMPARISON:  10/11/2020 FINDINGS: Midline trachea. Mild cardiomegaly, accentuated by AP portable technique. No pleural effusion or pneumothorax. Left greater than right lower lobe predominant interstitial opacities  are similar. IMPRESSION: Similar left greater than right lower lobe predominant interstitial opacities. Given the clinical history, likely related to COVID-19 pneumonia or developing post infectious fibrosis. As findings are new compared to 05/05/2018, non acute etiologies such as interval interstitial lung disease could look similar. Electronically Signed   By: Abigail Miyamoto M.D.   On: 10/12/2020 15:01    EKG: Independently reviewed.  Sinus rhythm with PVCs.  No acute ST changes.  Assessment/Plan: Principal Problem:   Acute respiratory failure with hypoxia (HCC) Active Problems:   Hypothyroidism   Essential hypertension, benign   GERD (gastroesophageal reflux disease)   Lung mass   Pneumonia due to COVID-19 virus   Hypokalemia   This patient was discussed with the ED physician, including pertinent vitals, physical exam findings, labs, and imaging.  We also discussed care given by the ED provider.  1. Acute respiratory failure with hypoxia a. Oxygen support 2. Covid pneumonia Procalcitonin less than 0.1.  Unlikely that this represents bacterial pneumonia.  We will stop antibiotics. Daily labs: CBC, CMP, CRP, D-dimer, ferritin, magnesium, phosphorus Continue steroids Proning Supplemental oxygen Zinc and vitamin C Albuterol HFA as  needed Antitussives 3. Hypokalemia a. Replace potassium by mouth b. Recheck potassium in the morning 4. Lung mass a. Likely primary lung cancer b. Patient will likely need CT/PET c. Patient will need work-up to evaluate for metastatic disease 5. GERD a. Continue home regimen 6. Hypertension a. Continue home regimen 7. Hypothyroidism a. Continue home regimen  DVT prophylaxis: Lovenox Consultants: None Code Status: DNR Family Communication: None Disposition Plan: Patient should be able to return home following hospitalization   Truett Mainland, DO

## 2020-10-12 NOTE — Telephone Encounter (Signed)
Pt went to [facility] as directed yesterday. Pt states they did a chest xray, blood work and placed her on oxygen. Pt states that she did feel better during and after oxygen. Pt left after 3 hours; ER stated that there were 10 people ahead of her. Pt states will drop to lower 70's if she gets up and walks 5-6 steps. If she is sitting, her stats are good. This morning stats are 92%. Pt also states that for the past 3 nights she has ran a fever (100.0-100.1) and also had chills. Takes 2 tylenol for fever and chills. Pt states that "she is not going to ER unless they are ready for her". Please advise. Thank you

## 2020-10-12 NOTE — ED Provider Notes (Signed)
[facility] EMERGENCY DEPARTMENT Provider Note   CSN: [id number] Arrival date & time: 10/12/20  1257     History Chief Complaint  Patient presents with  . Shortness of Breath    [name] is a 65 y.o. female.  Patient diagnosed with Covid pneumonia back in February 18.  Patient was not hypoxic at that time.  Patient for the past few days has been very short of breath with walking even like 10 feet.  Patient is oxygen saturations at rest on room air will go down to 88%.  Patient otherwise denies any leg swelling leg pain.  Denies any chest pain.        Past Medical History:  Diagnosis Date  . Anxiety   . Arthritis   . Asthma   . Coronary atherosclerosis of native coronary artery    a. s/p DES to LAD and D1 in 2005 b. cath in 11/2015 showing patent stents with mild disease along RCA and LCx  . Depression    Prior suicide attempt  . Essential hypertension   . Fibromyalgia   . GERD (gastroesophageal reflux disease)   . History of blood transfusion 2005  . History of kidney stones   . Hyperlipidemia   . Hypothyroidism   . Myocardial infarction ([place]) 2017  . NSTEMI (non-ST elevated myocardial infarction) ([place]) 2005  . Palpitations    PVC's have been noted  . Sleep apnea     Patient Active Problem List   Diagnosis Date Noted  . Dyspnea on exertion 09/21/2020  . Acute bacterial rhinosinusitis 07/17/2020  . Prediabetes 07/17/2020  . Fatigue 07/17/2020  . Need for vaccination 06/27/2020  . Breast abscess of female 06/15/2020  . Bronchitis with wheezing 06/15/2020  . Yeast infection of the vagina 06/15/2020  . Unstable angina ([place]) 10/21/2017  . Screening mammogram, encounter for 10/09/2017  . B12 deficiency 03/17/2017  . Bradycardia   . NSTEMI (non-ST elevated myocardial infarction) ([place]) 11/21/2015  . Elevated troponin   . Chest pain on exertion 11/20/2015  . Cervical myelopathy ([place]) 09/04/2015  . Morbid obesity ([place]) 09/12/2014  . External hemorrhoid  04/30/2014  . Irritable bowel syndrome 04/30/2014  . Fatty liver disease, nonalcoholic 32/44/0102  . Impaired fasting glucose 11/18/2013  . Anxiety and depression 11/10/2013  . GERD (gastroesophageal reflux disease) 11/10/2013  . Scalp psoriasis 12/29/2012  . Fibromyalgia 12/29/2012  . Essential hypertension, benign 02/08/2010  . Hypothyroidism 03/08/2009  . Mixed hyperlipidemia 03/08/2009  . CORONARY ATHEROSCLEROSIS NATIVE CORONARY ARTERY 03/08/2009    Past Surgical History:  Procedure Laterality Date  . CARDIAC CATHETERIZATION  ~ 2009/2010  . CARDIAC CATHETERIZATION N/A 11/21/2015   Procedure: Left Heart Cath and Coronary Angiography;  Surgeon: Burnell Blanks, MD;  Location: [facility];  Service: Cardiovascular;  Laterality: N/A;  . CARDIAC CATHETERIZATION  10/21/2017  . CARPAL TUNNEL RELEASE Bilateral 2007- 2008  . CORONARY ANGIOPLASTY WITH STENT PLACEMENT  2005  . FOOT BONE EXCISION     Sesmoid bone left foot-repair of fracture  . LEFT HEART CATH AND CORONARY ANGIOGRAPHY N/A 10/21/2017   Procedure: LEFT HEART CATH AND CORONARY ANGIOGRAPHY;  Surgeon: Wellington Hampshire, MD;  Location: [facility];  Service: Cardiovascular;  Laterality: N/A;  . TUBAL LIGATION       OB History   No obstetric history on file.     Family History  Problem Relation Age of Onset  . Coronary artery disease Father        MI  age 35  . Heart failure Father   . Heart failure Mother   . Anesthesia problems Neg Hx   . Hypotension Neg Hx   . Malignant hyperthermia Neg Hx   . Pseudochol deficiency Neg Hx     Social History   Tobacco Use  . Smoking status: Current Every Day Smoker    Packs/day: 0.33    Years: 49.00    Pack years: 16.17    Types: Cigarettes    Start date: 05/31/1969  . Smokeless tobacco: Never Used  . Tobacco comment: 1 pack per week & vapes also   Vaping Use  . Vaping Use: Every day  Substance Use Topics  . Alcohol use: No    Alcohol/week: 0.0  standard drinks  . Drug use: No    Home Medications Prior to Admission medications   Medication Sig Start Date End Date Taking? Authorizing Provider  isosorbide mononitrate (IMDUR) 30 MG 24 hr tablet TAKE 1/2 TABLET ONE TIME DAILY. Needs visit by early summer 10/11/20   Kathyrn Drown, MD  levothyroxine (SYNTHROID) 125 MCG tablet TAKE 1/2 TABLET ON SUNDAYS AND ONE TABLET ALL OTHER DAYS. Needs visit by early summer. 10/11/20   Kathyrn Drown, MD  pantoprazole (PROTONIX) 40 MG tablet TAKE 1 TABLET EVERY DAY. Needs visit by early summer 10/11/20   Kathyrn Drown, MD  albuterol (VENTOLIN HFA) 108 (90 Base) MCG/ACT inhaler Inhale 2 puffs into the lungs every 6 (six) hours as needed for wheezing or shortness of breath. 06/26/20   Chalmers Guest, NP  ALPRAZolam Duanne Moron) 0.5 MG tablet Take one tablet daily as needed for anxiety. 06/27/20   Mozingo, Berdie Ogren, NP  amitriptyline (ELAVIL) 25 MG tablet Take 1 tablet by mouth at bedtime. 03/17/19   [provider]  amoxicillin (AMOXIL) 500 MG capsule Take 1 capsule (500 mg total) by mouth 3 (three) times daily. Patient not taking: Reported on 09/24/2020 07/17/20   Chalmers Guest, NP  amoxicillin-clavulanate (AUGMENTIN) 875-125 MG tablet Take 1 tablet by mouth 2 (two) times daily. Patient not taking: Reported on 10/12/2020 09/24/20   Kathyrn Drown, MD  atorvastatin (LIPITOR) 40 MG tablet TAKE 1 TABLET EVERY DAY (STOP THE 80MG ) 07/17/20   Chalmers Guest, NP  benzonatate (TESSALON) 200 MG capsule Take 1 capsule (200 mg total) by mouth 3 (three) times daily as needed for cough. Swallow whole, do not chew Patient not taking: Reported on 10/12/2020 09/21/20   Kem Parkinson, PA-C  Biotin 10000 MCG TABS Take 1 tablet by mouth daily.    [provider]  clopidogrel (PLAVIX) 75 MG tablet TAKE 1 TABLET EVERY DAY 06/18/20   Kathyrn Drown, MD  Cyanocobalamin (VITAMIN B-12 PO) Take 1 tablet by mouth daily.    [provider]  FLUoxetine  (PROZAC) 20 MG capsule Take 3 capsule po daily 07/17/20   Chalmers Guest, NP  HYDROcodone-acetaminophen (NORCO/VICODIN) 5-325 MG tablet Take 1 tablet by mouth every 6 (six) hours as needed for moderate pain.    [provider]  lidocaine (LIDODERM) 5 % Place 1 patch onto the skin daily as needed. 03/07/19   [provider]  metoprolol succinate (TOPROL-XL) 50 MG 24 hr tablet TAKE 1 TABLET (50 MG TOTAL) BY MOUTH DAILY. TAKE WITH OR IMMEDIATELY FOLLOWING A MEAL. 02/07/20 05/07/20  Satira Sark, MD  nitroGLYCERIN (NITROSTAT) 0.4 MG SL tablet DISSOLVE 1 TABLET UNDER TONGUE EVERY 5 MINUTES FOR UP TO 3 DOSES AS NEEDED FOR CHEST PAIN.  05/16/19   Kathyrn Drown, MD  predniSONE (DELTASONE) 20 MG tablet 3 qd for 2d then 2qd for 2d then 1qd for 2d 09/24/20   Luking, Elayne Snare, MD  PROLENSA 0.07 % SOLN Place 1 drop into the right eye at bedtime. 08/18/19   [provider]  tiZANidine (ZANAFLEX) 4 MG tablet Take 1-2 tablets by mouth daily. 03/17/19   [provider]    Allergies    Cymbalta [duloxetine hcl] and Lyrica [pregabalin]  Review of Systems   Review of Systems  Constitutional: Negative for chills and fever.  HENT: Negative for rhinorrhea and sore throat.   Eyes: Negative for visual disturbance.  Respiratory: Positive for shortness of breath. Negative for cough.   Cardiovascular: Negative for chest pain and leg swelling.  Gastrointestinal: Negative for abdominal pain, diarrhea, nausea and vomiting.  Genitourinary: Negative for dysuria.  Musculoskeletal: Negative for back pain and neck pain.  Skin: Negative for rash.  Neurological: Negative for dizziness, light-headedness and headaches.  Hematological: Does not bruise/bleed easily.  Psychiatric/Behavioral: Negative for confusion.    Physical Exam Updated Vital Signs BP 116/61 (BP Location: Left Arm)   Pulse 64   Temp 97.7 F (36.5 C) (Oral)   Resp 15   Ht 1.676 m (5\' 6" )   Wt 106.6 kg   SpO2 94%   BMI  37.93 kg/m   Physical Exam Vitals and nursing note reviewed.  Constitutional:      General: She is not in acute distress.    Appearance: Normal appearance. She is well-developed. She is not toxic-appearing.  HENT:     Head: Normocephalic and atraumatic.  Eyes:     Extraocular Movements: Extraocular movements intact.     Conjunctiva/sclera: Conjunctivae normal.     Pupils: Pupils are equal, round, and reactive to light.  Cardiovascular:     Rate and Rhythm: Normal rate and regular rhythm.     Heart sounds: No murmur heard.   Pulmonary:     Effort: Pulmonary effort is normal. No respiratory distress.     Breath sounds: Normal breath sounds. No wheezing or rales.  Abdominal:     Palpations: Abdomen is soft.     Tenderness: There is no abdominal tenderness.  Musculoskeletal:        General: No swelling.     Cervical back: Neck supple.  Skin:    General: Skin is warm and dry.     Capillary Refill: Capillary refill takes less than 2 seconds.  Neurological:     General: No focal deficit present.     Mental Status: She is alert.     Cranial Nerves: No cranial nerve deficit.     Sensory: No sensory deficit.     Motor: No weakness.     ED Results / Procedures / Treatments   Labs (all labs ordered are listed, but only abnormal results are displayed) Labs Reviewed  COMPREHENSIVE METABOLIC PANEL - Abnormal; Notable for the following components:      Result Value   Potassium 2.9 (*)    Glucose, Bld 106 (*)    Albumin 2.7 (*)    All other components within normal limits  CBC WITH DIFFERENTIAL/PLATELET - Abnormal; Notable for the following components:   WBC 12.5 (*)    RDW 16.2 (*)    Neutro Abs 8.8 (*)    Monocytes Absolute 1.2 (*)    All other components within normal limits    EKG None  Radiology DG Chest 2 View  Result  Date: 10/11/2020 CLINICAL DATA:  Shortness of breath EXAM: CHEST - 2 VIEW COMPARISON:  09/21/2020 FINDINGS: Stable cardiomediastinal silhouette.  Atherosclerotic calcification of the aortic knob. Diffuse interstitial opacities bilaterally, most pronounced within the perihilar and bibasilar regions. No pleural effusion or pneumothorax. IMPRESSION: Diffuse interstitial opacities bilaterally, most pronounced within the perihilar and bibasilar regions. Findings suggestive of multifocal atypical/viral pneumonia. Pulmonary edema would have a similar appearance. Electronically Signed   By: Davina Poke D.O.   On: 10/11/2020 16:59   DG Chest Port 1 View  Result Date: 10/12/2020 CLINICAL DATA:  Shortness of breath. Cough. COVID positive 09/21/2020 EXAM: PORTABLE CHEST 1 VIEW COMPARISON:  10/11/2020 FINDINGS: Midline trachea. Mild cardiomegaly, accentuated by AP portable technique. No pleural effusion or pneumothorax. Left greater than right lower lobe predominant interstitial opacities are similar. IMPRESSION: Similar left greater than right lower lobe predominant interstitial opacities. Given the clinical history, likely related to COVID-19 pneumonia or developing post infectious fibrosis. As findings are new compared to 05/05/2018, non acute etiologies such as interval interstitial lung disease could look similar. Electronically Signed   By: Abigail Miyamoto M.D.   On: 10/12/2020 15:01    Procedures Procedures   Medications Ordered in ED Medications  iohexol (OMNIPAQUE) 350 MG/ML injection 100 mL (100 mLs Intravenous Contrast Given 10/12/20 1533)    ED Course  I have reviewed the triage vital signs and the nursing notes.  Pertinent labs & imaging results that were available during my care of the patient were reviewed by me and considered in my medical decision making (see chart for details).    MDM Rules/Calculators/A&P                          This x-ray here is consistent with multifocal pneumonia.  There is seen diffuse interstitial opacities bilaterally.  Could possibly be pulmonary edema.  Patient certainly has hypoxia at room air and  worse with exertion.  Patient will require admission.  With the concern would be this far out from the Covid infection she could have a pulmonary embolus.  So CT angio has been ordered and is pending.  Patient turned over to Dr. Ottie Glazier in evening emergency physician who will review the CT angio and arrange for admission.    Final Clinical Impression(s) / ED Diagnoses Final diagnoses:  COVID  Hypoxia    Rx / DC Orders ED Discharge Orders    None       Fredia Sorrow, MD 10/12/20 1551

## 2020-10-13 DIAGNOSIS — J9601 Acute respiratory failure with hypoxia: Secondary | ICD-10-CM | POA: Diagnosis not present

## 2020-10-13 LAB — COMPREHENSIVE METABOLIC PANEL
ALT: 19 U/L (ref 0–44)
AST: 17 U/L (ref 15–41)
Albumin: 2.5 g/dL — ABNORMAL LOW (ref 3.5–5.0)
Alkaline Phosphatase: 96 U/L (ref 38–126)
Anion gap: 9 (ref 5–15)
BUN: 11 mg/dL (ref 8–23)
CO2: 22 mmol/L (ref 22–32)
Calcium: 8.9 mg/dL (ref 8.9–10.3)
Chloride: 107 mmol/L (ref 98–111)
Creatinine, Ser: 0.81 mg/dL (ref 0.44–1.00)
GFR, Estimated: >60 mL/min (ref >60)
Glucose, Bld: 224 mg/dL — ABNORMAL HIGH (ref 70–99)
Potassium: 4.3 mmol/L (ref 3.5–5.1)
Sodium: 138 mmol/L (ref 135–145)
Total Bilirubin: 0.2 mg/dL — ABNORMAL LOW (ref 0.3–1.2)
Total Protein: 6.9 g/dL (ref 6.5–8.1)

## 2020-10-13 LAB — CBC WITH DIFFERENTIAL/PLATELET
Abs Immature Granulocytes: 0.04 10*3/uL (ref 0.00–0.07)
Basophils Absolute: 0 10*3/uL (ref 0.0–0.1)
Basophils Relative: 0 %
Eosinophils Absolute: 0 10*3/uL (ref 0.0–0.5)
Eosinophils Relative: 0 %
HCT: 39.9 % (ref 36.0–46.0)
Hemoglobin: 12 g/dL (ref 12.0–15.0)
Immature Granulocytes: 0 %
Lymphocytes Relative: 9 %
Lymphs Abs: 0.9 10*3/uL (ref 0.7–4.0)
MCH: 28.1 pg (ref 26.0–34.0)
MCHC: 30.1 g/dL (ref 30.0–36.0)
MCV: 93.4 fL (ref 80.0–100.0)
Monocytes Absolute: 0.4 10*3/uL (ref 0.1–1.0)
Monocytes Relative: 4 %
Neutro Abs: 8.5 10*3/uL — ABNORMAL HIGH (ref 1.7–7.7)
Neutrophils Relative %: 87 %
Platelets: 329 10*3/uL (ref 150–400)
RBC: 4.27 MIL/uL (ref 3.87–5.11)
RDW: 16 % — ABNORMAL HIGH (ref 11.5–15.5)
WBC: 9.8 10*3/uL (ref 4.0–10.5)
nRBC: 0 % (ref 0.0–0.2)

## 2020-10-13 LAB — MAGNESIUM: Magnesium: 2 mg/dL (ref 1.7–2.4)

## 2020-10-13 LAB — D-DIMER, QUANTITATIVE: D-Dimer, Quant: 1.3 ug/mL-FEU — ABNORMAL HIGH (ref 0.00–0.50)

## 2020-10-13 LAB — C-REACTIVE PROTEIN: CRP: 11.8 mg/dL — ABNORMAL HIGH (ref <1.0)

## 2020-10-13 MED ORDER — DEXAMETHASONE 6 MG PO TABS: 6.0000 mg | ORAL_TABLET | ORAL | 0 refills | Status: AC

## 2020-10-13 MED ORDER — FLUCONAZOLE 150 MG PO TABS: 150.0000 mg | ORAL_TABLET | Freq: Once | ORAL | 2 refills | Status: AC

## 2020-10-13 MED ORDER — ASCORBIC ACID 500 MG PO TABS: 500.0000 mg | ORAL_TABLET | Freq: Every day | ORAL | 0 refills | Status: DC

## 2020-10-13 MED ORDER — GUAIFENESIN-DM 100-10 MG/5ML PO SYRP: 10.0000 mL | ORAL_SOLUTION | ORAL | 0 refills | Status: AC | PRN

## 2020-10-13 MED ORDER — ZINC SULFATE 220 (50 ZN) MG PO CAPS: 220.0000 mg | ORAL_CAPSULE | Freq: Every day | ORAL | 0 refills | Status: DC

## 2020-10-13 NOTE — Progress Notes (Signed)
Ambulated patient in the hallway while checking O2 saturation. Patient was 95% on room air while resting. Patient ambulated in the hallway with no oxygen and saturation was in between 92-95%. Patient stated she was dizzy and SOB. Patient did not ambulate far without needing to go back to sit down. MD notified.

## 2020-10-13 NOTE — Care Management CC44 (Signed)
Condition Code 44 Documentation Completed  Patient Details  Name: [name] MRN: [id number] Date of Birth: February 24, 1956   Condition Code 44 given:  Yes Patient signature on Condition Code 44 notice:  Yes Documentation of 2 MD's agreement:  Yes Code 44 added to claim:  Yes    Natasha Bence, LCSW 10/13/2020, 11:23 AM

## 2020-10-13 NOTE — Progress Notes (Signed)
Patient asleep Incentive spirometer , flutter valve placed on bedside table.

## 2020-10-13 NOTE — Discharge Summary (Signed)
Physician Discharge Summary  [name] GEZ:[id number] DOB: Oct 10, 1955 DOA: 10/12/2020  PCP: Tracy Drown, MD  Admit date: 10/12/2020  Discharge date: 10/13/2020  Admitted From:Home  Disposition:  Home  Recommendations for Outpatient Follow-up:  1. Follow up with PCP in 1-2 weeks 2. Continue on dexamethasone as prescribed for what appears to be hypoxemia related to 3. COVID-19 PNA 4. Follow-up with Dr. Tomie Cochran office regarding spiculated nodule noted to left upper lobe of lung 5. Continue other home medications as prior 6. Diflucan one-time dose prescribed for vulvovaginal candidiasis  Home Health: None  Equipment/Devices: None  Discharge Condition:Stable  CODE STATUS: DNR  Diet recommendation: Heart Healthy  Brief/Interim Summary: Per HPI: [name] is a 65 y.o. female with a history of coronary artery disease status post stenting, hypertension, GERD, fibromyalgia, history of NSTEMI, 50 years of smoking.  Patient seen for shortness of breath that has been increasing over the past couple of weeks, particularly worse over the past 3 days.  Shortness of breath worse with exertion and improved with rest.  She has been getting hypoxic after just a few steps and it takes her 5 to 10 minutes to recover.  She was diagnosed with COVID 2/18 with a cough, but no particular shortness of breath.  -Patient was noted to have oxygen saturation of approximately 88% on room air with elevated elevated CRP level and low procalcitonin.  She is noted to have a spiculated nodule in the left upper lobe concerning for primary lung cancer and will need close follow-up with referral that has been sent to Dr. Delton Cochran to evaluate further with imaging and lab work-up.  Office will call to schedule.  She will need to remain on dexamethasone as prescribed for several more days to help with her symptoms.  She was able to ambulate without any need for oxygen and she is overall feeling much  better and appears stable for discharge today.  Discharge Diagnoses:  Principal Problem:   Acute respiratory failure with hypoxia (HCC) Active Problems:   Hypothyroidism   Essential hypertension, benign   GERD (gastroesophageal reflux disease)   Lung mass   Pneumonia due to COVID-19 virus   Hypokalemia  Principal discharge diagnosis: Acute hypoxemic respiratory failure secondary to likely Covid pneumonia. New lung mass suggestive of primary lung cancer.  Discharge Instructions  Discharge Instructions    Diet - low sodium heart healthy   Complete by: As directed    Increase activity slowly   Complete by: As directed      Allergies as of 10/13/2020      Reactions   Cymbalta [duloxetine Hcl] Other (See Comments)   Felt bad   Lyrica [pregabalin] Other (See Comments)   Retained fluids      Medication List    STOP taking these medications   amoxicillin 500 MG capsule Commonly known as: AMOXIL   amoxicillin-clavulanate 875-125 MG tablet Commonly known as: AUGMENTIN   predniSONE 20 MG tablet Commonly known as: DELTASONE     TAKE these medications   acetaminophen 325 MG tablet Commonly known as: TYLENOL Take 650 mg by mouth every 6 (six) hours as needed.   albuterol 108 (90 Base) MCG/ACT inhaler Commonly known as: VENTOLIN HFA Inhale 2 puffs into the lungs every 6 (six) hours as needed for wheezing or shortness of breath.   ALPRAZolam 0.5 MG tablet Commonly known as: Xanax Take one tablet daily as needed for anxiety.   amitriptyline 25 MG tablet Commonly known as: ELAVIL Take  1 tablet by mouth at bedtime.   ascorbic acid 500 MG tablet Commonly known as: VITAMIN C Take 1 tablet (500 mg total) by mouth daily. Start taking on: October 14, 2020   atorvastatin 40 MG tablet Commonly known as: LIPITOR TAKE 1 TABLET EVERY DAY (STOP THE 80MG ) What changed:   how much to take  how to take this  when to take this   benzonatate 200 MG capsule Commonly known as:  TESSALON Take 1 capsule (200 mg total) by mouth 3 (three) times daily as needed for cough. Swallow whole, do not chew   Biotin 10000 MCG Tabs Take 1 tablet by mouth daily.   clopidogrel 75 MG tablet Commonly known as: PLAVIX TAKE 1 TABLET EVERY DAY   dexamethasone 6 MG tablet Commonly known as: DECADRON Take 1 tablet (6 mg total) by mouth daily for 5 days.   fluconazole 150 MG tablet Commonly known as: Diflucan Take 1 tablet (150 mg total) by mouth once for 1 dose.   FLUoxetine 20 MG capsule Commonly known as: PROZAC Take 3 capsule po daily   guaiFENesin-dextromethorphan 100-10 MG/5ML syrup Commonly known as: ROBITUSSIN DM Take 10 mLs by mouth every 4 (four) hours as needed for cough.   HYDROcodone-acetaminophen 5-325 MG tablet Commonly known as: NORCO/VICODIN Take 1 tablet by mouth every 6 (six) hours as needed for moderate pain.   hydrOXYzine 25 MG tablet Commonly known as: ATARAX/VISTARIL Take 25-50 mg by mouth daily as needed.   isosorbide mononitrate 30 MG 24 hr tablet Commonly known as: IMDUR TAKE 1/2 TABLET ONE TIME DAILY. Needs visit by early summer   levothyroxine 125 MCG tablet Commonly known as: SYNTHROID TAKE 1/2 TABLET ON SUNDAYS AND ONE TABLET ALL OTHER DAYS. Needs visit by early summer.   lidocaine 5 % Commonly known as: LIDODERM Place 1 patch onto the skin daily as needed.   metoprolol succinate 50 MG 24 hr tablet Commonly known as: TOPROL-XL TAKE 1 TABLET (50 MG TOTAL) BY MOUTH DAILY. TAKE WITH OR IMMEDIATELY FOLLOWING A MEAL.   nitroGLYCERIN 0.4 MG SL tablet Commonly known as: NITROSTAT DISSOLVE 1 TABLET UNDER TONGUE EVERY 5 MINUTES FOR UP TO 3 DOSES AS NEEDED FOR CHEST PAIN.   pantoprazole 40 MG tablet Commonly known as: PROTONIX TAKE 1 TABLET EVERY DAY. Needs visit by early summer   Prolensa 0.07 % Soln Generic drug: Bromfenac Sodium Place 1 drop into the right eye at bedtime.   tiZANidine 4 MG tablet Commonly known as: ZANAFLEX Take  1-2 tablets by mouth daily.   VITAMIN B-12 PO Take 1 tablet by mouth daily.   zinc sulfate 220 (50 Zn) MG capsule Take 1 capsule (220 mg total) by mouth daily. Start taking on: October 14, 2020       Follow-up Information    Luking, Tracy Snare, MD Follow up in 1 week(s).   Specialty: Family Medicine Contact information: [place] [address] [phone]              Allergies  Allergen Reactions  . Cymbalta [Duloxetine Hcl] Other (See Comments)    Felt bad  . Lyrica [Pregabalin] Other (See Comments)    Retained fluids    Consultations:  None   Procedures/Studies: DG Chest 2 View  Result Date: 10/11/2020 CLINICAL DATA:  Shortness of breath EXAM: CHEST - 2 VIEW COMPARISON:  09/21/2020 FINDINGS: Stable cardiomediastinal silhouette. Atherosclerotic calcification of the aortic knob. Diffuse interstitial opacities bilaterally, most pronounced within the perihilar and bibasilar  regions. No pleural effusion or pneumothorax. IMPRESSION: Diffuse interstitial opacities bilaterally, most pronounced within the perihilar and bibasilar regions. Findings suggestive of multifocal atypical/viral pneumonia. Pulmonary edema would have a similar appearance. Electronically Signed   By: Davina Poke D.O.   On: 10/11/2020 16:59   CT Angio Chest PE W/Cm &/Or Wo Cm  Result Date: 10/12/2020 CLINICAL DATA:  PE suspected EXAM: CT ANGIOGRAPHY CHEST WITH CONTRAST TECHNIQUE: Multidetector CT imaging of the chest was performed using the standard protocol during bolus administration of intravenous contrast. Multiplanar CT image reconstructions and MIPs were obtained to evaluate the vascular anatomy. CONTRAST:  148mL OMNIPAQUE IOHEXOL 350 MG/ML SOLN COMPARISON:  CT abdomen pelvis, 10/24/2015 FINDINGS: Cardiovascular: Satisfactory opacification of the pulmonary arteries to the segmental level. No evidence of pulmonary embolism. Mild cardiomegaly. Extensive 3 vessel coronary artery  calcifications and/or stents. No pericardial effusion. Aortic atherosclerosis. Mediastinum/Nodes: There are enlarged bilateral hilar, mediastinal, and prevascular lymph nodes, largest pretracheal nodes measuring 2.5 x 2.5 cm (series 5, image 34). Small hiatal hernia. Thyroid gland, trachea, and esophagus demonstrate no significant findings. Lungs/Pleura: There is moderate to severe pulmonary fibrosis in a pattern with apical to basal gradient featuring irregular peripheral interstitial opacity, septal thickening, traction bronchiectasis, areas of subpleural bronchiolectasis and honeycombing. There are scattered areas of superimposed ground-glass and heterogeneous airspace opacity. There is a spiculated nodule of the peripheral left upper lobe measuring 1.9 x 1.8 cm (series 7, image 48). No pleural effusion or pneumothorax. Upper Abdomen: No acute abnormality. Musculoskeletal: No chest wall abnormality. No acute or significant osseous findings. Review of the MIP images confirms the above findings. IMPRESSION: 1. Negative examination for pulmonary embolism. 2. There is moderate to severe pulmonary fibrosis in a pattern with apical to basal gradient featuring irregular peripheral interstitial opacity, septal thickening, traction bronchiectasis, areas of subpleural bronchiolectasis and honeycombing. Findings are consistent with a UIP pattern of fibrosis by ATS pulmonary fibrosis criteria, and are markedly worsened in comparison to the included lung bases on CT of the abdomen pelvis dated 10/24/2015. 3. There are scattered areas of acutely superimposed ground-glass and heterogeneous airspace opacity, consistent with infection or acute inflammatory flare of fibrotic interstitial lung disease. 4. There is a spiculated nodule of the peripheral left upper lobe measuring 1.9 x 1.8 cm, highly concerning for primary lung malignancy in the high risk setting of pulmonary fibrosis. Recommend multi disciplinary referral on a  nonemergent basis for consideration of PET metabolic characterization and tissue sampling. 5. There are enlarged bilateral hilar, mediastinal, and prevascular lymph nodes, nonspecific and possibly reactive in the setting of fibrosis. 6. Coronary artery disease. Aortic Atherosclerosis (ICD10-I70.0). Electronically Signed   By: Eddie Candle M.D.   On: 10/12/2020 16:05   DG Chest Port 1 View  Result Date: 10/12/2020 CLINICAL DATA:  Shortness of breath. Cough. COVID positive 09/21/2020 EXAM: PORTABLE CHEST 1 VIEW COMPARISON:  10/11/2020 FINDINGS: Midline trachea. Mild cardiomegaly, accentuated by AP portable technique. No pleural effusion or pneumothorax. Left greater than right lower lobe predominant interstitial opacities are similar. IMPRESSION: Similar left greater than right lower lobe predominant interstitial opacities. Given the clinical history, likely related to COVID-19 pneumonia or developing post infectious fibrosis. As findings are new compared to 05/05/2018, non acute etiologies such as interval interstitial lung disease could look similar. Electronically Signed   By: Abigail Miyamoto M.D.   On: 10/12/2020 15:01   DG Chest Portable 1 View  Result Date: 09/21/2020 CLINICAL DATA:  Cough, short of breath, headache, weakness, COVID-19 positive EXAM: PORTABLE  CHEST 1 VIEW COMPARISON:  05/05/2018 FINDINGS: Single frontal view of the chest demonstrates a stable cardiac silhouette. Moderate diffuse interstitial and ground-glass opacities are seen consistent with COVID-19 pneumonia. No effusion or pneumothorax. No acute bony abnormalities. IMPRESSION: 1. Moderate multifocal bilateral pneumonia compatible with COVID 19. Electronically Signed   By: Randa Ngo M.D.   On: 09/21/2020 16:19      Discharge Exam: Vitals:   10/13/20 0143 10/13/20 0621  BP: 110/71 116/67  Pulse: 65 (!) 57  Resp: 18 20  Temp: 97.8 F (36.6 C) (!) 97.5 F (36.4 C)  SpO2: 91% 91%   Vitals:   10/12/20 1900 10/12/20 2215  10/13/20 0143 10/13/20 0621  BP: 112/67 113/69 110/71 116/67  Pulse: 66 63 65 (!) 57  Resp: (!) 21 18 18 20   Temp:  98.1 F (36.7 C) 97.8 F (36.6 C) (!) 97.5 F (36.4 C)  TempSrc:  Oral Oral Oral  SpO2: 99% 94% 91% 91%  Weight:      Height:        General: Pt is alert, awake, not in acute distress, obese Cardiovascular: RRR, S1/S2 +, no rubs, no gallops Respiratory: CTA bilaterally, no wheezing, no rhonchi Abdominal: Soft, NT, ND, bowel sounds + Extremities: no edema, no cyanosis    The results of significant diagnostics from this hospitalization (including imaging, microbiology, ancillary and laboratory) are listed below for reference.     Microbiology: Recent Results (from the past 240 hour(s))  Blood Culture (routine x 2)     Status: None (Preliminary result)   Collection Time: 10/12/20  5:10 PM   Specimen: Right Antecubital; Blood  Result Value [place] Status   Specimen Description   Final    RIGHT ANTECUBITAL Performed at [facility], [address], [place], [place] [address]    Special Requests   Final    BOTTLES DRAWN AEROBIC AND ANAEROBIC Blood Culture adequate volume Performed at [facility], [address], [place], [place] [address]    Culture   Final    NO GROWTH < 24 HOURS Performed at [facility], [address], [place], [place] [address]    Report Status PENDING  Incomplete  Blood Culture (routine x 2)     Status: None (Preliminary result)   Collection Time: 10/12/20  5:10 PM   Specimen: BLOOD RIGHT HAND  Result Value [place] Status   Specimen Description   Final    BLOOD RIGHT HAND Performed at [facility], [address] [place], [place] [address]    Special Requests   Final    BOTTLES DRAWN AEROBIC AND ANAEROBIC Blood Culture adequate volume Performed at [facility], [address] [place], [place] [address]    Culture   Final    NO GROWTH < 24 HOURS Performed at [facility], [address]  [place], [place] [address]    Report Status PENDING  Incomplete     Labs: BNP (last 3 results) No results for input(s): BNP in the last 8760 hours. Basic Metabolic Panel: Recent Labs  Lab 10/11/20 1610 10/12/20 1434 10/13/20 0512  NA 138 138 138  K 4.0 2.9* 4.3  CL 105 103 107  CO2 25 23 22   GLUCOSE 129* 106* 224*  BUN 8 11 11   CREATININE 0.91 0.84 0.81  CALCIUM 9.1 8.9 8.9  MG  --   --  2.0   Liver Function Tests: Recent Labs  Lab 10/12/20 1434 10/13/20 0512  AST 16 17  ALT 19 19  ALKPHOS 102 96  BILITOT 0.6 0.2*  PROT 7.3 6.9  ALBUMIN 2.7* 2.5*   No results for input(s): LIPASE, AMYLASE in the last 168 hours. No results for input(s): AMMONIA in the last 168 hours. CBC: Recent Labs  Lab 10/11/20 1610 10/12/20 1434 10/13/20 0512  WBC 14.0* 12.5* 9.8  NEUTROABS  --  8.8* 8.5*  HGB 13.2 12.8 12.0  HCT 43.2 42.0 39.9  MCV 91.9 92.5 93.4  PLT 365 341 329   Cardiac Enzymes: No results for input(s): CKTOTAL, CKMB, CKMBINDEX, TROPONINI in the last 168 hours. BNP: Invalid input(s): POCBNP CBG: No results for input(s): GLUCAP in the last 168 hours. D-Dimer Recent Labs    10/12/20 1710 10/13/20 0512  DDIMER 1.19* 1.30*   Hgb A1c No results for input(s): HGBA1C in the last 72 hours. Lipid Profile Recent Labs    10/12/20 1710  TRIG 83   Thyroid function studies No results for input(s): TSH, T4TOTAL, T3FREE, THYROIDAB in the last 72 hours.  Invalid input(s): FREET3 Anemia work up Recent Labs    10/12/20 1434  FERRITIN 53   Urinalysis    Component Value Date/Time   COLORURINE YELLOW 09/07/2008 0912   APPEARANCEUR CLEAR 09/07/2008 0912   LABSPEC <1.005 (L) 09/07/2008 0912   PHURINE 6.5 09/07/2008 0912   GLUCOSEU NEGATIVE 09/07/2008 0912   HGBUR NEGATIVE 09/07/2008 0912   BILIRUBINUR + 09/08/2017 0832   KETONESUR NEGATIVE 09/07/2008 0912   PROTEINUR NEGATIVE 09/07/2008 0912   UROBILINOGEN 0.2 09/07/2008 0912   NITRITE NEGATIVE 09/07/2008 0912    LEUKOCYTESUR  09/07/2008 0912    NEGATIVE MICROSCOPIC NOT DONE ON URINES WITH NEGATIVE PROTEIN, BLOOD, LEUKOCYTES, NITRITE, OR GLUCOSE <1000 mg/dL.   Sepsis Labs Invalid input(s): PROCALCITONIN,  WBC,  LACTICIDVEN Microbiology Recent Results (from the past 240 hour(s))  Blood Culture (routine x 2)     Status: None (Preliminary result)   Collection Time: 10/12/20  5:10 PM   Specimen: Right Antecubital; Blood  Result Value [place] Status   Specimen Description   Final    RIGHT ANTECUBITAL Performed at [facility], [address], [place], [place] [address]    Special Requests   Final    BOTTLES DRAWN AEROBIC AND ANAEROBIC Blood Culture adequate volume Performed at [facility], [address], [place], [place] [address]    Culture   Final    NO GROWTH < 24 HOURS Performed at [facility], [address] [place], [place] [address]    Report Status PENDING  Incomplete  Blood Culture (routine x 2)     Status: None (Preliminary result)   Collection Time: 10/12/20  5:10 PM   Specimen: BLOOD RIGHT HAND  Result Value [place] Status   Specimen Description   Final    BLOOD RIGHT HAND Performed at [facility], [address], [place], [place] [address]    Special Requests   Final    BOTTLES DRAWN AEROBIC AND ANAEROBIC Blood Culture adequate volume Performed at [facility], [address], [place], [place] [address]    Culture   Final    NO GROWTH < 24 HOURS Performed at [facility], [address] [place], [place] [address]    Report Status PENDING  Incomplete     Time coordinating discharge: 35 minutes  SIGNED:   Rodena Goldmann, DO [facility] 10/13/2020, 10:53 AM  If 7PM-7AM, please contact night-coverage [email or web address]

## 2020-10-13 NOTE — Care Management Obs Status (Signed)
[facility] NOTIFICATION   Patient Details  Name: ANEIRA CAVITT MRN: [id number] Date of Birth: Sep 01, 1955   Medicare Observation Status Notification Given:  Yes    Natasha Bence, LCSW 10/13/2020, 11:23 AM

## 2020-10-14 ENCOUNTER — Telehealth: Payer: Self-pay | Admitting: Family Medicine

## 2020-10-14 LAB — BLOOD CULTURE ID PANEL (REFLEXED) - BCID2

## 2020-10-14 NOTE — Telephone Encounter (Signed)
Nurse's-patient recently discharged from the hospital.  She was in the hospital with Covid pneumonia  Need to bring her back to be rechecked within 7 to 10 days [place] if she feels she is having a problem Preferable afternoon visit side door entrance  Please call patient, let them know that we are aware that they were discharged from the hospital.  We need for patient to do standard hospital follow up visit.  Please schedule them to follow-up with [place] within the next 7 to 14 days. Advised the patient to bring all of their medications with him to the visit.   Review meds with patient does patient understand meds?  Is patient having any immediate needs or questions?

## 2020-10-15 ENCOUNTER — Other Ambulatory Visit: Payer: Self-pay

## 2020-10-15 ENCOUNTER — Encounter: Payer: Self-pay | Admitting: Family Medicine

## 2020-10-15 ENCOUNTER — Ambulatory Visit (INDEPENDENT_AMBULATORY_CARE_PROVIDER_SITE_OTHER): Payer: Medicare HMO | Admitting: Family Medicine

## 2020-10-15 ENCOUNTER — Encounter (HOSPITAL_COMMUNITY): Payer: Self-pay

## 2020-10-15 DIAGNOSIS — J9601 Acute respiratory failure with hypoxia: Secondary | ICD-10-CM | POA: Diagnosis not present

## 2020-10-15 DIAGNOSIS — R918 Other nonspecific abnormal finding of lung field: Secondary | ICD-10-CM

## 2020-10-15 DIAGNOSIS — R0902 Hypoxemia: Secondary | ICD-10-CM | POA: Diagnosis not present

## 2020-10-15 DIAGNOSIS — J8410 Pulmonary fibrosis, unspecified: Secondary | ICD-10-CM | POA: Diagnosis not present

## 2020-10-15 DIAGNOSIS — I429 Cardiomyopathy, unspecified: Secondary | ICD-10-CM | POA: Diagnosis not present

## 2020-10-15 DIAGNOSIS — G959 Disease of spinal cord, unspecified: Secondary | ICD-10-CM | POA: Diagnosis not present

## 2020-10-15 LAB — CULTURE, BLOOD (ROUTINE X 2): Special Requests: ADEQUATE

## 2020-10-15 NOTE — Telephone Encounter (Signed)
We will need to work with her insurance She needs O2 starting today Please figure out the best way to get this done. If I need to see her this afternoon or late this morning in order to get this done so be it 2 L to 4l per nasal cannula to get O2 saturations into the 90s

## 2020-10-15 NOTE — Progress Notes (Signed)
   Subjective:    Patient ID: [name], female    DOB: Nov 11, 1955, 65 y.o.   MRN: [id number]  HPI Pt here for hospital follow up. Pt was in ER for low oxygen stat. Pt having hard time walking long distances.  Hospital follow-up Patient with significant shortness of breath her O2 saturations are staying in the mid 80s at home to the low 80s sometimes into the 70s She was in the hospital for a few days then released without oxygen She gets out of breath with minimal walking She relates some coughing denies high fever chills Her CAT scan showed a pulmonary nodule versus a mass which is suspicious for cancer Also shows pulmonary fibrosis Patient has underlying heart disease Also with morbid obesity She does try to watch her diet.  She is taking her medicines.  Just states severe shortness of breath Review of Systems Coughing congestion shortness of breath denies chest pain or discomfort denies high fever chills    Objective:   Physical Exam  Bilateral expiratory wheezes heart regular not respiratory distress pulse normal extremities no edema skin warm dry Oxygen readings were gathered during this visit O2 sat 82% sitting at rest After ambulating 83% With 2 L of nasal cannula 91% With 3 L nasal cannula 94% With oxygen and ambulating 92%       Assessment & Plan:  1. Pulmonary fibrosis ([place]) We will need to get her in with a pulmonologist but initially she needs to be seen by oncology for work-up of the pulmonary nodule.  More than likely will need a PET scan.  If no signs of it being anywhere else robotic surgery possibility  2. Hypoxia She needs 3 L nasal cannula O2 daily ongoing 24/7 more than likely will need this permanently because of the pulmonary fibrosis  3. Pulmonary mass Needs to see oncology has appointment this coming Monday will need PET scan and possible robotic surgery depending on what the PET scan shows  Will follow up the patient every 3 months  She  does need to do her lab work for her thyroid diabetes hyperlipidemia she will do that this week She voices understanding's of her medicine regimen and does not need any refills currently Psychologically she is handling this okay although she is feeling somewhat anxious about it  1. Pulmonary fibrosis ([place]) See above  2. Hypoxia See above  3. Pulmonary mass See above  4. Secondary cardiomyopathy ([place]) Followed by cardiology on medication denies any angina currently  5. Cervical myelopathy (HCC) Has nerve root injections periodically will be having some this Friday  6. Morbid obesity ([place]) Portion control healthy eating recommended

## 2020-10-15 NOTE — Telephone Encounter (Signed)
Patient scheduled office visit today at 11:50am with Dr Nicki Reaper for evaluation for oxygen and hospital follow up

## 2020-10-15 NOTE — Progress Notes (Signed)
Patient unavailable for introductory phone call at this time, I will plan to meet with her during initial visit with Dr. Delton Coombes

## 2020-10-15 NOTE — Telephone Encounter (Signed)
Patient states her oxygen numbers were running good in hospital ut she said they are not running good a home and she thinks she needs oxygen- says her numbers in 70 with activity and was 66 this am when went to bathroom and over the weekend her numbers were 71, 74, 75 and 77. After rest this am her number went from 66 to 73. Please advise

## 2020-10-16 DIAGNOSIS — R5383 Other fatigue: Secondary | ICD-10-CM | POA: Diagnosis not present

## 2020-10-16 DIAGNOSIS — E039 Hypothyroidism, unspecified: Secondary | ICD-10-CM | POA: Diagnosis not present

## 2020-10-16 DIAGNOSIS — R7303 Prediabetes: Secondary | ICD-10-CM | POA: Diagnosis not present

## 2020-10-16 DIAGNOSIS — K760 Fatty (change of) liver, not elsewhere classified: Secondary | ICD-10-CM | POA: Diagnosis not present

## 2020-10-16 DIAGNOSIS — E782 Mixed hyperlipidemia: Secondary | ICD-10-CM | POA: Diagnosis not present

## 2020-10-17 ENCOUNTER — Encounter: Payer: Self-pay | Admitting: Family Medicine

## 2020-10-17 ENCOUNTER — Other Ambulatory Visit: Payer: Self-pay

## 2020-10-17 ENCOUNTER — Inpatient Hospital Stay (HOSPITAL_COMMUNITY): Payer: Medicare HMO | Attending: Hematology | Admitting: Hematology

## 2020-10-17 ENCOUNTER — Encounter (HOSPITAL_COMMUNITY): Payer: Self-pay | Admitting: Hematology

## 2020-10-17 ENCOUNTER — Ambulatory Visit (INDEPENDENT_AMBULATORY_CARE_PROVIDER_SITE_OTHER): Payer: Medicare HMO | Admitting: Family Medicine

## 2020-10-17 ENCOUNTER — Encounter (HOSPITAL_COMMUNITY): Payer: Self-pay

## 2020-10-17 VITALS — BP 102/69 | HR 69 | Temp 97.0°F

## 2020-10-17 VITALS — BP 104/39 | HR 72 | Temp 98.4°F | Resp 20 | Ht 66.0 in

## 2020-10-17 DIAGNOSIS — C3490 Malignant neoplasm of unspecified part of unspecified bronchus or lung: Secondary | ICD-10-CM

## 2020-10-17 DIAGNOSIS — J8410 Pulmonary fibrosis, unspecified: Secondary | ICD-10-CM | POA: Diagnosis not present

## 2020-10-17 DIAGNOSIS — Z8616 Personal history of COVID-19: Secondary | ICD-10-CM | POA: Insufficient documentation

## 2020-10-17 DIAGNOSIS — Z87891 Personal history of nicotine dependence: Secondary | ICD-10-CM | POA: Diagnosis not present

## 2020-10-17 DIAGNOSIS — R918 Other nonspecific abnormal finding of lung field: Secondary | ICD-10-CM | POA: Diagnosis not present

## 2020-10-17 DIAGNOSIS — Z9981 Dependence on supplemental oxygen: Secondary | ICD-10-CM | POA: Diagnosis not present

## 2020-10-17 DIAGNOSIS — Z808 Family history of malignant neoplasm of other organs or systems: Secondary | ICD-10-CM | POA: Insufficient documentation

## 2020-10-17 DIAGNOSIS — C3412 Malignant neoplasm of upper lobe, left bronchus or lung: Secondary | ICD-10-CM

## 2020-10-17 DIAGNOSIS — Z993 Dependence on wheelchair: Secondary | ICD-10-CM | POA: Diagnosis not present

## 2020-10-17 LAB — LIPID PANEL
Chol/HDL Ratio: 2.4 ratio (ref 0.0–4.4)
Cholesterol, Total: 185 mg/dL (ref 100–199)
HDL: 76 mg/dL (ref >39)
LDL Chol Calc (NIH): 93 mg/dL (ref 0–99)
Triglycerides: 87 mg/dL (ref 0–149)
VLDL Cholesterol Cal: 16 mg/dL (ref 5–40)

## 2020-10-17 LAB — CBC WITH DIFFERENTIAL/PLATELET
Basophils Absolute: 0 10*3/uL (ref 0.0–0.2)
Basos: 0 %
EOS (ABSOLUTE): 0 10*3/uL (ref 0.0–0.4)
Eos: 0 %
Hematocrit: 40.3 % (ref 34.0–46.6)
Hemoglobin: 13 g/dL (ref 11.1–15.9)
Immature Grans (Abs): 0.1 10*3/uL (ref 0.0–0.1)
Immature Granulocytes: 1 %
Lymphocytes Absolute: 2.4 10*3/uL (ref 0.7–3.1)
Lymphs: 14 %
MCH: 27.9 pg (ref 26.6–33.0)
MCHC: 32.3 g/dL (ref 31.5–35.7)
MCV: 87 fL (ref 79–97)
Monocytes Absolute: 1.5 10*3/uL — ABNORMAL HIGH (ref 0.1–0.9)
Monocytes: 9 %
Neutrophils Absolute: 12.9 10*3/uL — ABNORMAL HIGH (ref 1.4–7.0)
Neutrophils: 76 %
Platelets: 447 10*3/uL (ref 150–450)
RBC: 4.66 x10E6/uL (ref 3.77–5.28)
RDW: 14.6 % (ref 11.7–15.4)
WBC: 17 10*3/uL — ABNORMAL HIGH (ref 3.4–10.8)

## 2020-10-17 LAB — COMPREHENSIVE METABOLIC PANEL
ALT: 22 IU/L (ref 0–32)
AST: 22 IU/L (ref 0–40)
Albumin/Globulin Ratio: 0.9 — ABNORMAL LOW (ref 1.2–2.2)
Albumin: 3.4 g/dL — ABNORMAL LOW (ref 3.8–4.8)
Alkaline Phosphatase: 132 IU/L — ABNORMAL HIGH (ref 44–121)
BUN/Creatinine Ratio: 20 (ref 12–28)
BUN: 18 mg/dL (ref 8–27)
Bilirubin Total: 0.3 mg/dL (ref 0.0–1.2)
CO2: 18 mmol/L — ABNORMAL LOW (ref 20–29)
Calcium: 9.2 mg/dL (ref 8.7–10.3)
Chloride: 101 mmol/L (ref 96–106)
Creatinine, Ser: 0.9 mg/dL (ref 0.57–1.00)
Globulin, Total: 3.9 g/dL (ref 1.5–4.5)
Glucose: 98 mg/dL (ref 65–99)
Potassium: 4 mmol/L (ref 3.5–5.2)
Sodium: 142 mmol/L (ref 134–144)
Total Protein: 7.3 g/dL (ref 6.0–8.5)
eGFR: 71 mL/min/{1.73_m2} (ref >59)

## 2020-10-17 LAB — HEMOGLOBIN A1C
Est. average glucose Bld gHb Est-mCnc: 137 mg/dL
Hgb A1c MFr Bld: 6.4 % — ABNORMAL HIGH (ref 4.8–5.6)

## 2020-10-17 LAB — TSH: TSH: 1.13 u[IU]/mL (ref 0.450–4.500)

## 2020-10-17 LAB — CULTURE, BLOOD (ROUTINE X 2)
Culture: NO GROWTH
Special Requests: ADEQUATE

## 2020-10-17 NOTE — Progress Notes (Signed)
I met with the patient today during her initial visit wit Dr. Katragadda. I introduced myself and explained my role in the patient's care. Patient confides fears surrounding multiple new diagnoses, I provide emotional support. I provided my contact information and encouraged the patient to call with questions or concerns. 

## 2020-10-17 NOTE — Patient Instructions (Signed)
[place] at [facility] Discharge Instructions  You were seen and examined today by Dr. Delton Coombes. Dr. Delton Coombes is a medical oncologist, meaning he specializes in the management of cancer diagnoses with medications. Dr. Delton Coombes discussed your past medical history, family history of cancer and the events that led to you being here today.  You were referred here following a recent CT scan which showed an area of concerning for cancer on your left lung. Dr. Delton Coombes has recommended a PET scan which is a specialized CT scan that illuminates where there is cancer present in your body. Dr. Delton Coombes has also recommended a brain MRI. These scans will confirm cancer and assess the stage of the cancer.  Follow-up as scheduled.   Thank you for choosing [place] at [facility] to provide your oncology and hematology care.  To afford each patient quality time with our provider, please arrive at least 15 minutes before your scheduled appointment time.   If you have a lab appointment with the [facility] please come in thru the Main Entrance and check in at the main information desk.  You need to re-schedule your appointment should you arrive 10 or more minutes late.  We strive to give you quality time with our providers, and arriving late affects you and other patients whose appointments are after yours.  Also, if you no show three or more times for appointments you may be dismissed from the clinic at the providers discretion.     Again, thank you for choosing [facility].  Our hope is that these requests will decrease the amount of time that you wait before being seen by our physicians.       _____________________________________________________________  Should you have questions after your visit to [facility], please contact our office at [phone] and follow the prompts.  Our office hours are 8:00 a.m. and 4:30 p.m.  Monday - Friday.  Please note that voicemails left after 4:00 p.m. may not be returned until the following business day.  We are closed weekends and major holidays.  You do have access to a nurse 24-7, just call the main number to the clinic [phone] and do not press any options, hold on the line and a nurse will answer the phone.    For prescription refill requests, have your pharmacy contact our office and allow 72 hours.    Due to Covid, you will need to wear a mask upon entering the hospital. If you do not have a mask, a mask will be given to you at the Main Entrance upon arrival. For doctor visits, patients may have 1 support person age 56 or older with them. For treatment visits, patients can not have anyone with them due to social distancing guidelines and our immunocompromised population.

## 2020-10-17 NOTE — Progress Notes (Signed)
   Subjective:    Patient ID: [name], female    DOB: [birth year], 65 y.o.   MRN: [id number]  HPI Pt here for low oxygen stats and low blood pressure per family. Pt is on 2 L at home and 4 L in office.  Patient's oxygen has been running low Finds himself feeling fatigued and tired Short of breath with activity Saw oncology they have set her up for an MRI and a PET scan Patient feeling very stressed about how things are going Does not feel she can live this way but after a time of settling down in calling herself she states now that she feels that she can move forward She is seeing psychiatry they are treating her with antidepressants and nerve pills She has had to take a few extra recently She denies abusing medicine Her O2 sats were in the 60s without oxygen With 2 L came up to the low 80s With 4 L came up to 92 With ambulation it stayed in the upper 80s to low 90s  Review of Systems Please see above    Objective:   Physical Exam Bilateral crackles in the bases along with some wheezes heart regular extremities no edema skin warm dry   30 minutes spent with patient discussing her visit with oncology also monitoring her and giving her oxygen and watching her O2 saturations.  And discussing further interventions    Assessment & Plan:  Patient's long-term prognosis is poor She will be oxygen dependent for now Proceed forward with oncology work-up Hopefully the cancer is not progressed and is treatable Pulmonary fibrosis is severe Referral to pulmonary Also notify her psychiatrist of her difficult situation Patient feels that she can keep moving forward and denies being suicidal. We will recheck her again in 1 month She is to reach out to [place] if any problems I believe she would benefit from 4 L of oxygen to keep her O2 saturations in the low 90s.  She does have oxygen at home.

## 2020-10-17 NOTE — Progress Notes (Signed)
[place] [address] [place] [address]   CLINIC:  Medical Oncology/Hematology  CONSULT NOTE  Patient Care Team: Kathyrn Drown, MD as PCP - General (Family Medicine) Satira Sark, MD as PCP - Cardiology (Cardiology) Leeroy Cha, MD as Consulting Physician (Neurosurgery) Brien Mates, RN as Oncology Nurse Navigator (Oncology)  CHIEF COMPLAINTS/PURPOSE OF CONSULTATION:  Evaluation of left upper lobe lung cancer  HISTORY OF PRESENTING ILLNESS:  [name] 65 y.o. female is here because of evaluation of left upper lobe lung mass, at the request of Dr. Sallee Lange.  Today she reports feeling fair. She uses 2 L of oxygen via [place] due to her oxygen saturation dropping down to the 60's. She started noticing the decline in her breathing at least 2 years ago. She tested positive for COVID pneumonia on 09/21/2020. She notes that for Christmas 2021, it took her 3 days to prepare the food which previously only took her 1 day. Her appetite has slightly decreased since she started getting sick. She was never told that she has pulmonary fibrosis.  She lives at home with her husband and 2 stepsons. 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 She has smoked 1 PPD for 50 years and quit in February. She denies family history of lung fibrosis. Her father had throat cancer after smoking.  MEDICAL HISTORY:  Past Medical History:  Diagnosis Date  . Anxiety   . Arthritis   . Asthma   . Coronary atherosclerosis of native coronary artery    a. s/p DES to LAD and D1 in 2005 b. cath in 11/2015 showing patent stents with mild disease along RCA and LCx  . Depression    Prior suicide attempt  . Essential hypertension   . Fibromyalgia   . GERD (gastroesophageal reflux disease)   . History of blood transfusion 2005  . History of kidney stones   . Hyperlipidemia   . Hypothyroidism   . Myocardial infarction ([employer]) 2017  . NSTEMI  (non-ST elevated myocardial infarction) ([place]) 2005  . Palpitations    PVC's have been noted  . Sleep apnea     SURGICAL HISTORY: Past Surgical History:  Procedure Laterality Date  . CARDIAC CATHETERIZATION  ~ 2009/2010  . CARDIAC CATHETERIZATION N/A 11/21/2015   Procedure: Left Heart Cath and Coronary Angiography;  Surgeon: Burnell Blanks, MD;  Location: [place] [facility];  Service: Cardiovascular;  Laterality: N/A;  . CARDIAC CATHETERIZATION  10/21/2017  . CARPAL TUNNEL RELEASE Bilateral 2007- 2008  . CORONARY ANGIOPLASTY WITH STENT PLACEMENT  2005  . FOOT BONE EXCISION     Sesmoid bone left foot-repair of fracture  . LEFT HEART CATH AND CORONARY ANGIOGRAPHY N/A 10/21/2017   Procedure: LEFT HEART CATH AND CORONARY ANGIOGRAPHY;  Surgeon: Wellington Hampshire, MD;  Location: [place] [facility];  Service: Cardiovascular;  Laterality: N/A;  . TUBAL LIGATION      SOCIAL HISTORY: Social History   Socioeconomic History  . Marital status: Married    Spouse name: Not on file  . Number of children: Not on file  . Years of education: Not on file  . Highest education level: Not on file  Occupational History  . Not on file  Tobacco Use  . Smoking status: Former Smoker    Packs/day: 0.33    Years: 49.00    Pack years: 16.17    Types: Cigarettes    Start date: 05/31/1969  Quit date: 09/19/2020    Years since quitting: 0.0  . Smokeless tobacco: Never Used  Vaping Use  . Vaping Use: Every day  Substance and Sexual Activity  . Alcohol use: No    Alcohol/week: 0.0 standard drinks  . Drug use: No  . Sexual activity: Yes    Birth control/protection: Post-menopausal  Other Topics Concern  . Not on file  Social History Narrative  . Not on file   Social Determinants of Health   Financial Resource Strain: Low Risk   . Difficulty of Paying Living Expenses: Not very hard  Food Insecurity: No Food Insecurity  . Worried About Charity fundraiser in the Last Year: Never true  .  Ran Out of Food in the Last Year: Never true  Transportation Needs: Unmet Transportation Needs  . Lack of Transportation (Medical): Yes  . Lack of Transportation (Non-Medical): No  Physical Activity: Inactive  . Days of Exercise per Week: 0 days  . Minutes of Exercise per Session: 0 min  Stress: No Stress Concern Present  . Feeling of Stress : Not at all  Social Connections: Moderately Isolated  . Frequency of Communication with Friends and Family: More than three times a week  . Frequency of Social Gatherings with Friends and Family: More than three times a week  . Attends Religious Services: Never  . Active Member of Clubs or Organizations: No  . Attends Archivist Meetings: Never  . Marital Status: Married  Human resources officer Violence: Not At Risk  . Fear of Current or Ex-Partner: No  . Emotionally Abused: No  . Physically Abused: No  . Sexually Abused: No    FAMILY HISTORY: Family History  Problem Relation Age of Onset  . Coronary artery disease Father        MI age 21  . Heart failure Father   . Heart failure Mother   . Anesthesia problems Neg Hx   . Hypotension Neg Hx   . Malignant hyperthermia Neg Hx   . Pseudochol deficiency Neg Hx     ALLERGIES:  is allergic to cymbalta [duloxetine hcl] and lyrica [pregabalin].  MEDICATIONS:  Current Outpatient Medications  Medication Sig Dispense Refill  . ascorbic acid (VITAMIN C) 500 MG tablet Take 1 tablet (500 mg total) by mouth daily. 30 tablet 0  . atorvastatin (LIPITOR) 40 MG tablet TAKE 1 TABLET EVERY DAY (STOP THE 80MG ) (Patient taking differently: Take 20 mg by mouth daily. TAKE 1 TABLET EVERY DAY (STOP THE 80MG )) 90 tablet 1  . Cyanocobalamin (VITAMIN B-12 PO) Take 1 tablet by mouth daily.    Marland Kitchen dexamethasone (DECADRON) 6 MG tablet Take 1 tablet (6 mg total) by mouth daily for 5 days. 5 tablet 0  . FLUoxetine (PROZAC) 20 MG capsule Take 3 capsule po daily 270 capsule 1  . isosorbide mononitrate (IMDUR) 30 MG  24 hr tablet TAKE 1/2 TABLET ONE TIME DAILY. Needs visit by early summer 45 tablet 0  . levothyroxine (SYNTHROID) 125 MCG tablet TAKE 1/2 TABLET ON SUNDAYS AND ONE TABLET ALL OTHER DAYS. Needs visit by early summer. 85 tablet 0  . pantoprazole (PROTONIX) 40 MG tablet TAKE 1 TABLET EVERY DAY. Needs visit by early summer 90 tablet 0  . PROLENSA 0.07 % SOLN Place 1 drop into the right eye at bedtime.    Marland Kitchen tiZANidine (ZANAFLEX) 4 MG tablet Take 1-2 tablets by mouth daily.    Marland Kitchen zinc sulfate 220 (50 Zn) MG capsule Take 1 capsule (220  mg total) by mouth daily. 30 capsule 0  . acetaminophen (TYLENOL) 325 MG tablet Take 650 mg by mouth every 6 (six) hours as needed. (Patient not taking: Reported on 10/17/2020)    . albuterol (VENTOLIN HFA) 108 (90 Base) MCG/ACT inhaler Inhale 2 puffs into the lungs every 6 (six) hours as needed for wheezing or shortness of breath. (Patient not taking: Reported on 10/17/2020) 18 g 1  . ALPRAZolam (XANAX) 0.5 MG tablet Take one tablet daily as needed for anxiety. (Patient not taking: Reported on 10/17/2020) 30 tablet 0  . amitriptyline (ELAVIL) 25 MG tablet Take 1 tablet by mouth at bedtime. (Patient not taking: Reported on 10/17/2020)    . benzonatate (TESSALON) 200 MG capsule Take 1 capsule (200 mg total) by mouth 3 (three) times daily as needed for cough. Swallow whole, do not chew (Patient not taking: Reported on 10/17/2020) 21 capsule 0  . Biotin 10000 MCG TABS Take 1 tablet by mouth daily.    . clopidogrel (PLAVIX) 75 MG tablet TAKE 1 TABLET EVERY DAY 90 tablet 1  . guaiFENesin-dextromethorphan (ROBITUSSIN DM) 100-10 MG/5ML syrup Take 10 mLs by mouth every 4 (four) hours as needed for cough. (Patient not taking: Reported on 10/17/2020) 118 mL 0  . HYDROcodone-acetaminophen (NORCO/VICODIN) 5-325 MG tablet Take 1 tablet by mouth every 6 (six) hours as needed for moderate pain. (Patient not taking: Reported on 10/17/2020)    . hydrOXYzine (ATARAX/VISTARIL) 25 MG tablet Take 25-50 mg  by mouth daily as needed. (Patient not taking: Reported on 10/17/2020)    . lidocaine (LIDODERM) 5 % Place 1 patch onto the skin daily as needed. (Patient not taking: Reported on 10/17/2020)    . metoprolol succinate (TOPROL-XL) 50 MG 24 hr tablet TAKE 1 TABLET (50 MG TOTAL) BY MOUTH DAILY. TAKE WITH OR IMMEDIATELY FOLLOWING A MEAL. 90 tablet 3  . nitroGLYCERIN (NITROSTAT) 0.4 MG SL tablet DISSOLVE 1 TABLET UNDER TONGUE EVERY 5 MINUTES FOR UP TO 3 DOSES AS NEEDED FOR CHEST PAIN. (Patient not taking: Reported on 10/17/2020) 25 tablet 3   No current facility-administered medications for this visit.    REVIEW OF SYSTEMS:   Review of Systems  Constitutional: Positive for appetite change and fatigue. Negative for unexpected weight change.  Respiratory: Positive for shortness of breath.   All other systems reviewed and are negative.    PHYSICAL EXAMINATION: ECOG PERFORMANCE STATUS: 3 - Symptomatic, >50% confined to bed  Vitals:   10/17/20 0844 10/17/20 0851  BP: (!) 104/39   Pulse: 72   Resp: 20   Temp: 98.4 F (36.9 C)   SpO2:  (!) 88%   Filed Weights   Physical Exam Vitals reviewed.  Constitutional:      Appearance: Normal appearance. She is obese.     Interventions: Nasal cannula in place.     Comments: In wheelchair  Cardiovascular:     Rate and Rhythm: Normal rate and regular rhythm.     Pulses: Normal pulses.     Heart sounds: Normal heart sounds.  Pulmonary:     Effort: Pulmonary effort is normal.     Breath sounds: Examination of the right-lower field reveals rhonchi. Examination of the left-lower field reveals rhonchi. Rhonchi present.  Chest:  Breasts:     Right: No supraclavicular adenopathy.     Left: No supraclavicular adenopathy.    Abdominal:     Palpations: Abdomen is soft. There is no hepatomegaly, splenomegaly or mass.     Tenderness: There is no abdominal  tenderness.     Hernia: No hernia is present.  Lymphadenopathy:     Cervical: No cervical  adenopathy.     Upper Body:     Right upper body: No supraclavicular adenopathy.     Left upper body: No supraclavicular adenopathy.  Neurological:     General: No focal deficit present.     Mental Status: She is alert and oriented to person, place, and time.  Psychiatric:        Mood and Affect: Mood normal.        Behavior: Behavior normal.      LABORATORY DATA:  I have reviewed the data as listed CBC Latest Ref Rng & Units 10/16/2020 10/13/2020 10/12/2020  WBC 3.4 - 10.8 x10E3/uL 17.0(H) 9.8 12.5(H)  Hemoglobin 11.1 - 15.9 g/dL 13.0 12.0 12.8  Hematocrit 34.0 - 46.6 % 40.3 39.9 42.0  Platelets 150 - 450 x10E3/uL 447 329 341   CMP 10/16/2020 10/13/2020 10/12/2020  Glucose WILL FOLLOW 224(H) 106(H)  BUN WILL FOLLOW 11 11  Creatinine WILL FOLLOW 0.81 0.84  Sodium WILL FOLLOW 138 138  Potassium WILL FOLLOW 4.3 2.9(L)  Chloride WILL FOLLOW 107 103  CO2 WILL FOLLOW 22 23  Calcium WILL FOLLOW 8.9 8.9  Total Protein WILL FOLLOW 6.9 7.3  Total Bilirubin WILL FOLLOW 0.2(L) 0.6  Alkaline Phos WILL FOLLOW 96 102  AST WILL FOLLOW 17 16  ALT WILL FOLLOW 19 19    RADIOGRAPHIC STUDIES: I have personally reviewed the radiological images as listed and agreed with the findings in the report. DG Chest 2 View  Result Date: 10/11/2020 CLINICAL DATA:  Shortness of breath EXAM: CHEST - 2 VIEW COMPARISON:  09/21/2020 FINDINGS: Stable cardiomediastinal silhouette. Atherosclerotic calcification of the aortic knob. Diffuse interstitial opacities bilaterally, most pronounced within the perihilar and bibasilar regions. No pleural effusion or pneumothorax. IMPRESSION: Diffuse interstitial opacities bilaterally, most pronounced within the perihilar and bibasilar regions. Findings suggestive of multifocal atypical/viral pneumonia. Pulmonary edema would have a similar appearance. Electronically Signed   By: Davina Poke D.O.   On: 10/11/2020 16:59   CT Angio Chest PE W/Cm &/Or Wo Cm  Result Date:  10/12/2020 CLINICAL DATA:  PE suspected EXAM: CT ANGIOGRAPHY CHEST WITH CONTRAST TECHNIQUE: Multidetector CT imaging of the chest was performed using the standard protocol during bolus administration of intravenous contrast. Multiplanar CT image reconstructions and MIPs were obtained to evaluate the vascular anatomy. CONTRAST:  128mL OMNIPAQUE IOHEXOL 350 MG/ML SOLN COMPARISON:  CT abdomen pelvis, 10/24/2015 FINDINGS: Cardiovascular: Satisfactory opacification of the pulmonary arteries to the segmental level. No evidence of pulmonary embolism. Mild cardiomegaly. Extensive 3 vessel coronary artery calcifications and/or stents. No pericardial effusion. Aortic atherosclerosis. Mediastinum/Nodes: There are enlarged bilateral hilar, mediastinal, and prevascular lymph nodes, largest pretracheal nodes measuring 2.5 x 2.5 cm (series 5, image 34). Small hiatal hernia. Thyroid gland, trachea, and esophagus demonstrate no significant findings. Lungs/Pleura: There is moderate to severe pulmonary fibrosis in a pattern with apical to basal gradient featuring irregular peripheral interstitial opacity, septal thickening, traction bronchiectasis, areas of subpleural bronchiolectasis and honeycombing. There are scattered areas of superimposed ground-glass and heterogeneous airspace opacity. There is a spiculated nodule of the peripheral left upper lobe measuring 1.9 x 1.8 cm (series 7, image 48). No pleural effusion or pneumothorax. Upper Abdomen: No acute abnormality. Musculoskeletal: No chest wall abnormality. No acute or significant osseous findings. Review of the MIP images confirms the above findings. IMPRESSION: 1. Negative examination for pulmonary embolism. 2. There is moderate to severe  pulmonary fibrosis in a pattern with apical to basal gradient featuring irregular peripheral interstitial opacity, septal thickening, traction bronchiectasis, areas of subpleural bronchiolectasis and honeycombing. Findings are consistent with  a UIP pattern of fibrosis by ATS pulmonary fibrosis criteria, and are markedly worsened in comparison to the included lung bases on CT of the abdomen pelvis dated 10/24/2015. 3. There are scattered areas of acutely superimposed ground-glass and heterogeneous airspace opacity, consistent with infection or acute inflammatory flare of fibrotic interstitial lung disease. 4. There is a spiculated nodule of the peripheral left upper lobe measuring 1.9 x 1.8 cm, highly concerning for primary lung malignancy in the high risk setting of pulmonary fibrosis. Recommend multi disciplinary referral on a nonemergent basis for consideration of PET metabolic characterization and tissue sampling. 5. There are enlarged bilateral hilar, mediastinal, and prevascular lymph nodes, nonspecific and possibly reactive in the setting of fibrosis. 6. Coronary artery disease. Aortic Atherosclerosis (ICD10-I70.0). Electronically Signed   By: Eddie Candle M.D.   On: 10/12/2020 16:05   DG Chest Port 1 View  Result Date: 10/12/2020 CLINICAL DATA:  Shortness of breath. Cough. COVID positive 09/21/2020 EXAM: PORTABLE CHEST 1 VIEW COMPARISON:  10/11/2020 FINDINGS: Midline trachea. Mild cardiomegaly, accentuated by AP portable technique. No pleural effusion or pneumothorax. Left greater than right lower lobe predominant interstitial opacities are similar. IMPRESSION: Similar left greater than right lower lobe predominant interstitial opacities. Given the clinical history, likely related to COVID-19 pneumonia or developing post infectious fibrosis. As findings are new compared to 05/05/2018, non acute etiologies such as interval interstitial lung disease could look similar. Electronically Signed   By: Abigail Miyamoto M.D.   On: 10/12/2020 15:01   DG Chest Portable 1 View  Result Date: 09/21/2020 CLINICAL DATA:  Cough, short of breath, headache, weakness, COVID-19 positive EXAM: PORTABLE CHEST 1 VIEW COMPARISON:  05/05/2018 FINDINGS: Single frontal  view of the chest demonstrates a stable cardiac silhouette. Moderate diffuse interstitial and ground-glass opacities are seen consistent with COVID-19 pneumonia. No effusion or pneumothorax. No acute bony abnormalities. IMPRESSION: 1. Moderate multifocal bilateral pneumonia compatible with COVID 19. Electronically Signed   By: Randa Ngo M.D.   On: 09/21/2020 16:19    ASSESSMENT:  1.  Left upper lobe lung mass: -Recent admission from 10/12/2020 through 10/13/2020 for shortness of breath and prior Covid pneumonia diagnosed on 09/21/2020. -CT PE protocol on 10/12/2020 showed spiculated nodule in the peripheral left upper lobe measuring 1.9 x 1.8 cm with enlarged bilateral hilar, mediastinal and prevascular lymph nodes, largest pretracheal nodes measuring 2.5 x 2.5 cm.  Moderate to severe pulmonary fibrosis in a pattern with apical to basal gradient featuring irregular peripheral interstitial opacity, septal thickening, traction bronchiectasis and areas of subpleural bronchiolectasis and honeycombing. -She has been on 2 L oxygen by nasal cannula since recent hospitalization.  2.  Social/family history: -She is a retired Theme park manager for 40 years.  She lives at home with her husband and 2 stepsons. -Smoked 1 pack/day for 50 years, quit last month and is currently on patches and lozenges. -Father had throat cancer at age 59.  Maternal aunt had cancer.    PLAN:  1.  Left upper lobe lung mass: -We have reviewed images of the CT chest from recent hospitalization. -Recommend further work-up with a PET CT scan and brain MRI with and without contrast. -We will discuss scan results and further plan at next visit. -She cannot do PFTs at this time.  We will consider navigational bronchoscopy and possible biopsy when her  breathing stabilizes.   All questions were answered. The patient knows to call the clinic with any problems, questions or concerns.   Derek Jack, MD, 10/17/20 9:15 AM  [place] [phone]   I, Milinda Antis, am acting as a scribe for Dr. Sanda Linger.  I, Derek Jack MD, have reviewed the above documentation for accuracy and completeness, and I agree with the above.

## 2020-10-18 ENCOUNTER — Telehealth: Payer: Self-pay | Admitting: Adult Health

## 2020-10-18 ENCOUNTER — Other Ambulatory Visit: Payer: Self-pay | Admitting: Family Medicine

## 2020-10-18 MED ORDER — AZITHROMYCIN 250 MG PO TABS: ORAL_TABLET | ORAL | 0 refills | Status: DC

## 2020-10-18 NOTE — Progress Notes (Signed)
Note given to front staff with patient name and number to call and get pt set up in 4 weeks

## 2020-10-18 NOTE — Addendum Note (Signed)
Addended by: Vicente Males on: 10/18/2020 10:13 AM   Modules accepted: Orders

## 2020-10-18 NOTE — Telephone Encounter (Signed)
Pt called and said that her PCP has suggested that her anxiety should be increased. [name] said that she has a lot going on with her right now. She has a lot of health issues and they found spots on her lung. She doesn't want to increase medicine without speaking to [facility]. Please give her a call at [phone].

## 2020-10-18 NOTE — Progress Notes (Signed)
Referral placed.

## 2020-10-19 ENCOUNTER — Telehealth: Payer: Self-pay | Admitting: *Deleted

## 2020-10-19 NOTE — Telephone Encounter (Signed)
Rtn call to pt and she reports she has a lot of health issues, she was just told she has Pulmonary Fibrosis. She reports she was told by our front office we won't be taking her insurance soon. I informed her I didn't do anything with billing but that could be possible. Anyway she decided she will see if her PCP will start writing for her Alprazolam 0.5 mg 1 daily. If he won't she will call back Monday for an increase.

## 2020-10-19 NOTE — Telephone Encounter (Signed)
Noted  

## 2020-10-19 NOTE — Telephone Encounter (Signed)
Last apt was 04/2020 Has upcoming 11/21/20   Will call her to get information and what she is taking for anxiety

## 2020-10-19 NOTE — Telephone Encounter (Signed)
Pt states dr Nicki Reaper wanted her to go up on xanax. Takes one daily now and gets refills [facility]. States she called them today and they told her she needed appt before they could increase. Has appt for end of April and was told starting in may they would no longer take her insurance. States dr Nicki Reaper told her he would write her xanax for her. She would like for him to and send to mail order. [employer]. (advised dr Nicki Reaper does not recommend mail order for controlled meds in case they get lost in mail they cannot be replaced and she states she is getting them from mail order now and would like to continue from mail order).   Pt also wanted to let dr scott know she is using 3 liters of O2 sitting and goes up to 3.5 when moving around. States O2 sitting is around 92 and drops in the high 70's/ 80's when moving around but only last for a few seconds til she sits back down and then goes back up to around 92.

## 2020-10-21 ENCOUNTER — Encounter (HOSPITAL_COMMUNITY): Payer: Self-pay | Admitting: Emergency Medicine

## 2020-10-21 ENCOUNTER — Emergency Department (HOSPITAL_COMMUNITY): Payer: Medicare HMO

## 2020-10-21 ENCOUNTER — Inpatient Hospital Stay (HOSPITAL_COMMUNITY)
Admission: EM | Admit: 2020-10-21 | Discharge: 2020-10-26 | DRG: 193 | Disposition: A | Discharge status: Hospice | Payer: Medicare HMO | Attending: Family Medicine | Admitting: Family Medicine

## 2020-10-21 ENCOUNTER — Other Ambulatory Visit: Payer: Self-pay

## 2020-10-21 DIAGNOSIS — I2510 Atherosclerotic heart disease of native coronary artery without angina pectoris: Secondary | ICD-10-CM

## 2020-10-21 DIAGNOSIS — Z79899 Other long term (current) drug therapy: Secondary | ICD-10-CM

## 2020-10-21 DIAGNOSIS — I25110 Atherosclerotic heart disease of native coronary artery with unstable angina pectoris: Secondary | ICD-10-CM | POA: Diagnosis not present

## 2020-10-21 DIAGNOSIS — E782 Mixed hyperlipidemia: Secondary | ICD-10-CM | POA: Diagnosis present

## 2020-10-21 DIAGNOSIS — R0600 Dyspnea, unspecified: Secondary | ICD-10-CM | POA: Diagnosis not present

## 2020-10-21 DIAGNOSIS — J9621 Acute and chronic respiratory failure with hypoxia: Secondary | ICD-10-CM | POA: Diagnosis present

## 2020-10-21 DIAGNOSIS — Z87891 Personal history of nicotine dependence: Secondary | ICD-10-CM

## 2020-10-21 DIAGNOSIS — K760 Fatty (change of) liver, not elsewhere classified: Secondary | ICD-10-CM | POA: Diagnosis not present

## 2020-10-21 DIAGNOSIS — I959 Hypotension, unspecified: Secondary | ICD-10-CM | POA: Diagnosis not present

## 2020-10-21 DIAGNOSIS — G4489 Other headache syndrome: Secondary | ICD-10-CM | POA: Diagnosis not present

## 2020-10-21 DIAGNOSIS — Z79890 Hormone replacement therapy (postmenopausal): Secondary | ICD-10-CM

## 2020-10-21 DIAGNOSIS — K219 Gastro-esophageal reflux disease without esophagitis: Secondary | ICD-10-CM

## 2020-10-21 DIAGNOSIS — I10 Essential (primary) hypertension: Secondary | ICD-10-CM

## 2020-10-21 DIAGNOSIS — Z87442 Personal history of urinary calculi: Secondary | ICD-10-CM

## 2020-10-21 DIAGNOSIS — Z888 Allergy status to other drugs, medicaments and biological substances status: Secondary | ICD-10-CM

## 2020-10-21 DIAGNOSIS — Z66 Do not resuscitate: Secondary | ICD-10-CM | POA: Diagnosis not present

## 2020-10-21 DIAGNOSIS — E119 Type 2 diabetes mellitus without complications: Secondary | ICD-10-CM | POA: Diagnosis present

## 2020-10-21 DIAGNOSIS — R911 Solitary pulmonary nodule: Secondary | ICD-10-CM | POA: Diagnosis not present

## 2020-10-21 DIAGNOSIS — Z7902 Long term (current) use of antithrombotics/antiplatelets: Secondary | ICD-10-CM

## 2020-10-21 DIAGNOSIS — U099 Post covid-19 condition, unspecified: Secondary | ICD-10-CM | POA: Diagnosis present

## 2020-10-21 DIAGNOSIS — J069 Acute upper respiratory infection, unspecified: Secondary | ICD-10-CM | POA: Diagnosis present

## 2020-10-21 DIAGNOSIS — I200 Unstable angina: Secondary | ICD-10-CM

## 2020-10-21 DIAGNOSIS — D72829 Elevated white blood cell count, unspecified: Secondary | ICD-10-CM | POA: Diagnosis not present

## 2020-10-21 DIAGNOSIS — Z515 Encounter for palliative care: Secondary | ICD-10-CM

## 2020-10-21 DIAGNOSIS — M797 Fibromyalgia: Secondary | ICD-10-CM | POA: Diagnosis present

## 2020-10-21 DIAGNOSIS — T380X5A Adverse effect of glucocorticoids and synthetic analogues, initial encounter: Secondary | ICD-10-CM | POA: Diagnosis present

## 2020-10-21 DIAGNOSIS — R918 Other nonspecific abnormal finding of lung field: Secondary | ICD-10-CM | POA: Diagnosis present

## 2020-10-21 DIAGNOSIS — F411 Generalized anxiety disorder: Secondary | ICD-10-CM | POA: Diagnosis not present

## 2020-10-21 DIAGNOSIS — Y95 Nosocomial condition: Secondary | ICD-10-CM | POA: Diagnosis present

## 2020-10-21 DIAGNOSIS — F419 Anxiety disorder, unspecified: Secondary | ICD-10-CM | POA: Diagnosis present

## 2020-10-21 DIAGNOSIS — J84112 Idiopathic pulmonary fibrosis: Secondary | ICD-10-CM | POA: Diagnosis not present

## 2020-10-21 DIAGNOSIS — E669 Obesity, unspecified: Secondary | ICD-10-CM | POA: Diagnosis present

## 2020-10-21 DIAGNOSIS — R0902 Hypoxemia: Secondary | ICD-10-CM

## 2020-10-21 DIAGNOSIS — J9690 Respiratory failure, unspecified, unspecified whether with hypoxia or hypercapnia: Secondary | ICD-10-CM | POA: Diagnosis not present

## 2020-10-21 DIAGNOSIS — J9601 Acute respiratory failure with hypoxia: Secondary | ICD-10-CM | POA: Diagnosis present

## 2020-10-21 DIAGNOSIS — E538 Deficiency of other specified B group vitamins: Secondary | ICD-10-CM | POA: Diagnosis present

## 2020-10-21 DIAGNOSIS — J440 Chronic obstructive pulmonary disease with acute lower respiratory infection: Secondary | ICD-10-CM | POA: Diagnosis not present

## 2020-10-21 DIAGNOSIS — Z6837 Body mass index (BMI) 37.0-37.9, adult: Secondary | ICD-10-CM

## 2020-10-21 DIAGNOSIS — R7303 Prediabetes: Secondary | ICD-10-CM | POA: Diagnosis not present

## 2020-10-21 DIAGNOSIS — E039 Hypothyroidism, unspecified: Secondary | ICD-10-CM | POA: Diagnosis present

## 2020-10-21 DIAGNOSIS — Z7189 Other specified counseling: Secondary | ICD-10-CM

## 2020-10-21 DIAGNOSIS — G4733 Obstructive sleep apnea (adult) (pediatric): Secondary | ICD-10-CM | POA: Diagnosis present

## 2020-10-21 DIAGNOSIS — Z9151 Personal history of suicidal behavior: Secondary | ICD-10-CM

## 2020-10-21 DIAGNOSIS — E038 Other specified hypothyroidism: Secondary | ICD-10-CM | POA: Diagnosis not present

## 2020-10-21 DIAGNOSIS — R0602 Shortness of breath: Secondary | ICD-10-CM | POA: Diagnosis not present

## 2020-10-21 DIAGNOSIS — G959 Disease of spinal cord, unspecified: Secondary | ICD-10-CM | POA: Diagnosis present

## 2020-10-21 DIAGNOSIS — I429 Cardiomyopathy, unspecified: Secondary | ICD-10-CM | POA: Diagnosis present

## 2020-10-21 DIAGNOSIS — J8410 Pulmonary fibrosis, unspecified: Secondary | ICD-10-CM | POA: Diagnosis present

## 2020-10-21 DIAGNOSIS — I252 Old myocardial infarction: Secondary | ICD-10-CM

## 2020-10-21 DIAGNOSIS — Z85118 Personal history of other malignant neoplasm of bronchus and lung: Secondary | ICD-10-CM

## 2020-10-21 DIAGNOSIS — J189 Pneumonia, unspecified organism: Secondary | ICD-10-CM | POA: Diagnosis not present

## 2020-10-21 DIAGNOSIS — M1990 Unspecified osteoarthritis, unspecified site: Secondary | ICD-10-CM | POA: Diagnosis present

## 2020-10-21 DIAGNOSIS — Z8249 Family history of ischemic heart disease and other diseases of the circulatory system: Secondary | ICD-10-CM

## 2020-10-21 DIAGNOSIS — G4730 Sleep apnea, unspecified: Secondary | ICD-10-CM | POA: Diagnosis not present

## 2020-10-21 DIAGNOSIS — R0609 Other forms of dyspnea: Secondary | ICD-10-CM | POA: Diagnosis present

## 2020-10-21 DIAGNOSIS — Z955 Presence of coronary angioplasty implant and graft: Secondary | ICD-10-CM

## 2020-10-21 DIAGNOSIS — F32A Depression, unspecified: Secondary | ICD-10-CM | POA: Diagnosis present

## 2020-10-21 DIAGNOSIS — U071 COVID-19: Secondary | ICD-10-CM | POA: Diagnosis present

## 2020-10-21 DIAGNOSIS — R069 Unspecified abnormalities of breathing: Secondary | ICD-10-CM | POA: Diagnosis not present

## 2020-10-21 HISTORY — DX: Malignant (primary) neoplasm, unspecified: C80.1

## 2020-10-21 HISTORY — DX: Pulmonary fibrosis, unspecified: J84.10

## 2020-10-21 LAB — COMPREHENSIVE METABOLIC PANEL
ALT: 29 U/L (ref 0–44)
AST: 24 U/L (ref 15–41)
Albumin: 2.4 g/dL — ABNORMAL LOW (ref 3.5–5.0)
Alkaline Phosphatase: 104 U/L (ref 38–126)
Anion gap: 10 (ref 5–15)
BUN: 17 mg/dL (ref 8–23)
CO2: 24 mmol/L (ref 22–32)
Calcium: 8.5 mg/dL — ABNORMAL LOW (ref 8.9–10.3)
Chloride: 99 mmol/L (ref 98–111)
Creatinine, Ser: 0.82 mg/dL (ref 0.44–1.00)
GFR, Estimated: >60 mL/min (ref >60)
Glucose, Bld: 136 mg/dL — ABNORMAL HIGH (ref 70–99)
Potassium: 4.3 mmol/L (ref 3.5–5.1)
Sodium: 133 mmol/L — ABNORMAL LOW (ref 135–145)
Total Bilirubin: 0.7 mg/dL (ref 0.3–1.2)
Total Protein: 7 g/dL (ref 6.5–8.1)

## 2020-10-21 LAB — GLUCOSE, CAPILLARY
Glucose-Capillary: 175 mg/dL — ABNORMAL HIGH (ref 70–99)
Glucose-Capillary: 205 mg/dL — ABNORMAL HIGH (ref 70–99)
Glucose-Capillary: 225 mg/dL — ABNORMAL HIGH (ref 70–99)
Glucose-Capillary: 231 mg/dL — ABNORMAL HIGH (ref 70–99)

## 2020-10-21 LAB — CBC WITH DIFFERENTIAL/PLATELET
Abs Immature Granulocytes: 0.63 10*3/uL — ABNORMAL HIGH (ref 0.00–0.07)
Basophils Absolute: 0.1 10*3/uL (ref 0.0–0.1)
Basophils Relative: 0 %
Eosinophils Absolute: 1.1 10*3/uL — ABNORMAL HIGH (ref 0.0–0.5)
Eosinophils Relative: 5 %
HCT: 43.2 % (ref 36.0–46.0)
Hemoglobin: 13.3 g/dL (ref 12.0–15.0)
Immature Granulocytes: 3 %
Lymphocytes Relative: 12 %
Lymphs Abs: 2.4 10*3/uL (ref 0.7–4.0)
MCH: 27.9 pg (ref 26.0–34.0)
MCHC: 30.8 g/dL (ref 30.0–36.0)
MCV: 90.6 fL (ref 80.0–100.0)
Monocytes Absolute: 1.9 10*3/uL — ABNORMAL HIGH (ref 0.1–1.0)
Monocytes Relative: 9 %
Neutro Abs: 14.9 10*3/uL — ABNORMAL HIGH (ref 1.7–7.7)
Neutrophils Relative %: 71 %
Platelets: 441 10*3/uL — ABNORMAL HIGH (ref 150–400)
RBC: 4.77 MIL/uL (ref 3.87–5.11)
RDW: 15.8 % — ABNORMAL HIGH (ref 11.5–15.5)
WBC: 21 10*3/uL — ABNORMAL HIGH (ref 4.0–10.5)
nRBC: 0.1 % (ref 0.0–0.2)

## 2020-10-21 LAB — BRAIN NATRIURETIC PEPTIDE: B Natriuretic Peptide: 63 pg/mL (ref 0.0–100.0)

## 2020-10-21 LAB — TROPONIN I (HIGH SENSITIVITY): Troponin I (High Sensitivity): 9 ng/L (ref <18)

## 2020-10-21 LAB — HIV ANTIBODY (ROUTINE TESTING W REFLEX): HIV Screen 4th Generation wRfx: NONREACTIVE

## 2020-10-21 MED ORDER — METHYLPREDNISOLONE SODIUM SUCC 125 MG IJ SOLR
80.0000 mg | Freq: Four times a day (QID) | INTRAMUSCULAR | Status: DC
  Administered 2020-10-21 – 2020-10-26 (×21): 80 mg via INTRAVENOUS
  Filled 2020-10-21 (×21): qty 2

## 2020-10-21 MED ORDER — SODIUM CHLORIDE 0.9 % IV SOLN
2.0000 g | INTRAVENOUS | Status: AC
  Administered 2020-10-21 – 2020-10-25 (×5): 2 g via INTRAVENOUS
  Filled 2020-10-21 (×5): qty 20

## 2020-10-21 MED ORDER — BIOTIN 10000 MCG PO TABS: 1.0000 | ORAL_TABLET | Freq: Every day | ORAL | Status: DC

## 2020-10-21 MED ORDER — GUAIFENESIN-DM 100-10 MG/5ML PO SYRP: 10.0000 mL | ORAL_SOLUTION | ORAL | Status: DC | PRN

## 2020-10-21 MED ORDER — METOPROLOL SUCCINATE ER 50 MG PO TB24
50.0000 mg | ORAL_TABLET | Freq: Every day | ORAL | Status: DC
  Administered 2020-10-21 – 2020-10-26 (×6): 50 mg via ORAL
  Filled 2020-10-21 (×6): qty 1

## 2020-10-21 MED ORDER — LEVOTHYROXINE SODIUM 125 MCG PO TABS
62.5000 ug | ORAL_TABLET | ORAL | Status: DC
  Administered 2020-10-21: 62.5 ug via ORAL
  Filled 2020-10-21: qty 0.5
  Filled 2020-10-21: qty 1

## 2020-10-21 MED ORDER — CLOPIDOGREL BISULFATE 75 MG PO TABS
75.0000 mg | ORAL_TABLET | Freq: Every day | ORAL | Status: DC
  Administered 2020-10-21 – 2020-10-26 (×6): 75 mg via ORAL
  Filled 2020-10-21 (×6): qty 1

## 2020-10-21 MED ORDER — FLUOXETINE HCL 20 MG PO CAPS
60.0000 mg | ORAL_CAPSULE | Freq: Every day | ORAL | Status: DC
  Administered 2020-10-21 – 2020-10-26 (×6): 60 mg via ORAL
  Filled 2020-10-21 (×6): qty 3

## 2020-10-21 MED ORDER — AMITRIPTYLINE HCL 25 MG PO TABS
25.0000 mg | ORAL_TABLET | Freq: Every evening | ORAL | Status: DC | PRN
  Administered 2020-10-22: 25 mg via ORAL
  Filled 2020-10-21: qty 1

## 2020-10-21 MED ORDER — IPRATROPIUM-ALBUTEROL 0.5-2.5 (3) MG/3ML IN SOLN
3.0000 mL | RESPIRATORY_TRACT | Status: DC
  Administered 2020-10-21 – 2020-10-22 (×8): 3 mL via RESPIRATORY_TRACT
  Filled 2020-10-21 (×9): qty 3

## 2020-10-21 MED ORDER — ALBUTEROL SULFATE HFA 108 (90 BASE) MCG/ACT IN AERS
4.0000 | INHALATION_SPRAY | Freq: Once | RESPIRATORY_TRACT | Status: AC
  Administered 2020-10-21: 4 via RESPIRATORY_TRACT
  Filled 2020-10-21: qty 6.7

## 2020-10-21 MED ORDER — ISOSORBIDE MONONITRATE ER 30 MG PO TB24
15.0000 mg | ORAL_TABLET | Freq: Every day | ORAL | Status: DC
  Administered 2020-10-21 – 2020-10-26 (×6): 15 mg via ORAL
  Filled 2020-10-21 (×6): qty 1

## 2020-10-21 MED ORDER — PANTOPRAZOLE SODIUM 40 MG PO TBEC
40.0000 mg | DELAYED_RELEASE_TABLET | Freq: Every day | ORAL | Status: DC
  Administered 2020-10-21 – 2020-10-26 (×6): 40 mg via ORAL
  Filled 2020-10-21 (×6): qty 1

## 2020-10-21 MED ORDER — LEVOTHYROXINE SODIUM 125 MCG PO TABS
125.0000 ug | ORAL_TABLET | ORAL | Status: DC
  Administered 2020-10-22 – 2020-10-26 (×4): 125 ug via ORAL
  Filled 2020-10-21 (×5): qty 1

## 2020-10-21 MED ORDER — ENOXAPARIN SODIUM 40 MG/0.4ML ~~LOC~~ SOLN
40.0000 mg | SUBCUTANEOUS | Status: DC
  Administered 2020-10-21 – 2020-10-26 (×6): 40 mg via SUBCUTANEOUS
  Filled 2020-10-21 (×6): qty 0.4

## 2020-10-21 MED ORDER — VANCOMYCIN HCL IN DEXTROSE 1-5 GM/200ML-% IV SOLN
1000.0000 mg | Freq: Once | INTRAVENOUS | Status: AC
  Administered 2020-10-21: 1000 mg via INTRAVENOUS
  Filled 2020-10-21: qty 200

## 2020-10-21 MED ORDER — NICOTINE 21 MG/24HR TD PT24
21.0000 mg | MEDICATED_PATCH | Freq: Every day | TRANSDERMAL | Status: DC
  Administered 2020-10-21 – 2020-10-24 (×3): 21 mg via TRANSDERMAL
  Filled 2020-10-21 (×6): qty 1

## 2020-10-21 MED ORDER — HYDROCODONE-ACETAMINOPHEN 5-325 MG PO TABS
1.0000 | ORAL_TABLET | Freq: Four times a day (QID) | ORAL | Status: DC | PRN
Start: 2020-10-21 — End: 2020-10-26

## 2020-10-21 MED ORDER — NITROGLYCERIN 0.4 MG SL SUBL: 0.4000 mg | SUBLINGUAL_TABLET | SUBLINGUAL | Status: DC | PRN

## 2020-10-21 MED ORDER — ATORVASTATIN CALCIUM 20 MG PO TABS
20.0000 mg | ORAL_TABLET | Freq: Every evening | ORAL | Status: DC
  Administered 2020-10-21 – 2020-10-25 (×5): 20 mg via ORAL
  Filled 2020-10-21 (×5): qty 1

## 2020-10-21 MED ORDER — INSULIN ASPART 100 UNIT/ML ~~LOC~~ SOLN
4.0000 [IU] | Freq: Three times a day (TID) | SUBCUTANEOUS | Status: DC
  Administered 2020-10-21 – 2020-10-26 (×13): 4 [IU] via SUBCUTANEOUS

## 2020-10-21 MED ORDER — SODIUM CHLORIDE 0.9 % IV SOLN
500.0000 mg | INTRAVENOUS | Status: AC
  Administered 2020-10-21 – 2020-10-23 (×3): 500 mg via INTRAVENOUS
  Filled 2020-10-21 (×3): qty 500

## 2020-10-21 MED ORDER — METHYLPREDNISOLONE SODIUM SUCC 125 MG IJ SOLR
125.0000 mg | Freq: Once | INTRAMUSCULAR | Status: AC
  Administered 2020-10-21: 125 mg via INTRAVENOUS
  Filled 2020-10-21: qty 2

## 2020-10-21 MED ORDER — SODIUM CHLORIDE 0.9 % IV SOLN: INTRAVENOUS | Status: DC

## 2020-10-21 MED ORDER — ASCORBIC ACID 500 MG PO TABS
500.0000 mg | ORAL_TABLET | Freq: Every day | ORAL | Status: DC
  Administered 2020-10-21 – 2020-10-26 (×6): 500 mg via ORAL
  Filled 2020-10-21 (×6): qty 1

## 2020-10-21 MED ORDER — SODIUM CHLORIDE 0.9 % IV SOLN
2.0000 g | Freq: Once | INTRAVENOUS | Status: AC
  Administered 2020-10-21: 2 g via INTRAVENOUS
  Filled 2020-10-21: qty 2

## 2020-10-21 MED ORDER — ZINC SULFATE 220 (50 ZN) MG PO CAPS
220.0000 mg | ORAL_CAPSULE | Freq: Every day | ORAL | Status: DC
  Administered 2020-10-21 – 2020-10-26 (×6): 220 mg via ORAL
  Filled 2020-10-21 (×6): qty 1

## 2020-10-21 MED ORDER — SACCHAROMYCES BOULARDII 250 MG PO CAPS
250.0000 mg | ORAL_CAPSULE | Freq: Two times a day (BID) | ORAL | Status: DC
  Administered 2020-10-21 – 2020-10-26 (×11): 250 mg via ORAL
  Filled 2020-10-21 (×11): qty 1

## 2020-10-21 MED ORDER — ALPRAZOLAM 0.5 MG PO TABS
0.5000 mg | ORAL_TABLET | Freq: Three times a day (TID) | ORAL | Status: DC | PRN
  Administered 2020-10-22 – 2020-10-26 (×7): 0.5 mg via ORAL
  Filled 2020-10-21 (×2): qty 1
  Filled 2020-10-21: qty 2
  Filled 2020-10-21 (×5): qty 1

## 2020-10-21 MED ORDER — INSULIN ASPART 100 UNIT/ML ~~LOC~~ SOLN
0.0000 [IU] | Freq: Three times a day (TID) | SUBCUTANEOUS | Status: DC
  Administered 2020-10-21: 5 [IU] via SUBCUTANEOUS
  Administered 2020-10-21: 3 [IU] via SUBCUTANEOUS
  Administered 2020-10-21: 5 [IU] via SUBCUTANEOUS
  Administered 2020-10-22 (×2): 3 [IU] via SUBCUTANEOUS
  Administered 2020-10-22: 8 [IU] via SUBCUTANEOUS
  Administered 2020-10-23 (×2): 3 [IU] via SUBCUTANEOUS
  Administered 2020-10-23: 2 [IU] via SUBCUTANEOUS
  Administered 2020-10-24: 3 [IU] via SUBCUTANEOUS
  Administered 2020-10-24: 5 [IU] via SUBCUTANEOUS
  Administered 2020-10-24: 3 [IU] via SUBCUTANEOUS
  Administered 2020-10-25: 5 [IU] via SUBCUTANEOUS
  Administered 2020-10-25 (×2): 3 [IU] via SUBCUTANEOUS
  Administered 2020-10-26: 2 [IU] via SUBCUTANEOUS
  Administered 2020-10-26 (×2): 3 [IU] via SUBCUTANEOUS

## 2020-10-21 NOTE — H&P (Signed)
History and Physical  [facility]  [name] BSJ:[id number] DOB: Jan 04, 1956 DOA: 10/21/2020  PCP: Kathyrn Drown, MD  Patient coming from: Home Level of care: Telemetry  I have personally briefly reviewed patient's old medical records in [place]  Chief Complaint: SOB  HPI: [name] is a 65 y.o. female with medical history significant for COPD and pulmonary fibrosis from 28 years of smoking (stopped 2/22), newly discovered lung mass likely primary lung cancer, CAD s/p MI with stenting, GERD, Depression, Fibromyalgia, morbid obesity, HTN, prediabetes, OSA, hypothyroidism, hyperlipidemia, GAD, arthritis presented to ED with increasing shortness of breath symptoms.  Pt recovering from Covid infection diagnosed 7/65/46 complicated by pneumonia.   She reports that she has been working with her primary care physician and was started on supplemental oxygen recently due to having hypoxia at home with a pulse ox in the 70s.  Her oxygen has increased from an initial 2 L requirement up to 3.5 L at home.  She presented to the emergency department because she continued to have symptoms of shortness of breath even after she had escalated the oxygen to 3.5 L.  She denies fever and chills.  ED Course: Patient is afebrile With a heart rate of 24, blood pressure 108/72, pulse ox 100% on 3.5 L.  Patient had a sodium of 133, potassium 4.3, glucose 136, calcium 8.5, albumin 2.4, creatinine 0.82.  WBC 21.0, hemoglobin 13.3, hematocrit 43.2, platelets 441.  Cardiac BNP 63.0, high-sensitivity troponin IX.  Chest x-ray reported:  Evidence of pulmonary fibrosis as suspected on the CTA earlier this month with interval increased reticulonodular opacity in the right upper lobe favored to represent acute infectious exacerbation. Otherwise stable; spiculated left upper lobe lung nodule not visible radiographically.  Patient is being admitted with acute on chronic respiratory failure.  Review of  Systems: Review of Systems  Constitutional: Positive for diaphoresis, fever and malaise/fatigue. Negative for chills.  HENT: Positive for congestion and sore throat.   Eyes: Negative.   Respiratory: Positive for cough, sputum production, shortness of breath and wheezing.   Cardiovascular: Negative.   Gastrointestinal: Negative.   Genitourinary: Negative.   Musculoskeletal: Positive for myalgias.  Skin: Negative.   Neurological: Positive for dizziness. Negative for sensory change and focal weakness.  Endo/Heme/Allergies: Negative.   Psychiatric/Behavioral: Positive for depression. Negative for hallucinations, memory loss, substance abuse and suicidal ideas. The patient is nervous/anxious and has insomnia.      Past Medical History:  Diagnosis Date  . Anxiety   . Arthritis   . Asthma   . Cancer ([place])    lung  . Coronary atherosclerosis of native coronary artery    a. s/p DES to LAD and D1 in 2005 b. cath in 11/2015 showing patent stents with mild disease along RCA and LCx  . Depression    Prior suicide attempt  . Essential hypertension   . Fibromyalgia   . GERD (gastroesophageal reflux disease)   . History of blood transfusion 2005  . History of kidney stones   . Hyperlipidemia   . Hypothyroidism   . Myocardial infarction ([place]) 2017  . NSTEMI (non-ST elevated myocardial infarction) ([place]) 2005  . Palpitations    PVC's have been noted  . Pulmonary fibrosis ([place])   . Sleep apnea     Past Surgical History:  Procedure Laterality Date  . CARDIAC CATHETERIZATION  ~ 2009/2010  . CARDIAC CATHETERIZATION N/A 11/21/2015   Procedure: Left Heart Cath and Coronary Angiography;  Surgeon: Harrell Gave  Santina Evans, MD;  Location: [facility];  Service: Cardiovascular;  Laterality: N/A;  . CARDIAC CATHETERIZATION  10/21/2017  . CARPAL TUNNEL RELEASE Bilateral 2007- 2008  . CORONARY ANGIOPLASTY WITH STENT PLACEMENT  2005  . FOOT BONE EXCISION     Sesmoid bone left foot-repair of  fracture  . LEFT HEART CATH AND CORONARY ANGIOGRAPHY N/A 10/21/2017   Procedure: LEFT HEART CATH AND CORONARY ANGIOGRAPHY;  Surgeon: Wellington Hampshire, MD;  Location: [facility];  Service: Cardiovascular;  Laterality: N/A;  . TUBAL LIGATION       reports that she quit smoking about 4 weeks ago. Her smoking use included cigarettes. She started smoking about 51 years ago. She has a 16.17 pack-year smoking history. She has never used smokeless tobacco. She reports that she does not drink alcohol and does not use drugs.  Allergies  Allergen Reactions  . Cymbalta [Duloxetine Hcl] Other (See Comments)    Felt bad  . Lyrica [Pregabalin] Other (See Comments)    Retained fluids    Family History  Problem Relation Age of Onset  . Coronary artery disease Father        MI age 78  . Heart failure Father   . Heart failure Mother   . Anesthesia problems Neg Hx   . Hypotension Neg Hx   . Malignant hyperthermia Neg Hx   . Pseudochol deficiency Neg Hx     Prior to Admission medications   Medication Sig Start Date End Date Taking? Authorizing Provider  acetaminophen (TYLENOL) 325 MG tablet Take 650 mg by mouth every 6 (six) hours as needed for mild pain or headache.   Yes [provider]  albuterol (VENTOLIN HFA) 108 (90 Base) MCG/ACT inhaler Inhale 2 puffs into the lungs every 6 (six) hours as needed for wheezing or shortness of breath. 06/26/20  Yes Chalmers Guest, NP  ALPRAZolam Duanne Moron) 0.5 MG tablet Take one tablet daily as needed for anxiety. Patient taking differently: Take 0.5 mg by mouth daily as needed for anxiety. 06/27/20  Yes Mozingo, Berdie Ogren, NP  amitriptyline (ELAVIL) 25 MG tablet Take 1 tablet by mouth at bedtime as needed for sleep. 03/17/19  Yes [provider]  ascorbic acid (VITAMIN C) 500 MG tablet Take 1 tablet (500 mg total) by mouth daily. 10/14/20 11/13/20 Yes Shah, Pratik D, DO  atorvastatin (LIPITOR) 20 MG tablet Take 20 mg by mouth daily.    Yes [provider]  azithromycin (ZITHROMAX Z-PAK) 250 MG tablet Take 2 tablets (500 mg) on  Day 1,  followed by 1 tablet (250 mg) once daily on Days 2 through 5. 10/18/20  Yes Luking, Elayne Snare, MD  Biotin 10000 MCG TABS Take 1 tablet by mouth daily.   Yes [provider]  clopidogrel (PLAVIX) 75 MG tablet TAKE 1 TABLET EVERY DAY Patient taking differently: Take 75 mg by mouth daily. 06/18/20  Yes Luking, Elayne Snare, MD  Cyanocobalamin (VITAMIN B-12 PO) Take 1 tablet by mouth daily.   Yes [provider]  FLUoxetine (PROZAC) 20 MG capsule Take 3 capsule po daily Patient taking differently: Take 60 mg by mouth daily. Take 3 capsule po daily 07/17/20  Yes Dolby, Craige Cotta, NP  guaiFENesin-dextromethorphan (ROBITUSSIN DM) 100-10 MG/5ML syrup Take 10 mLs by mouth every 4 (four) hours as needed for cough. 10/13/20  Yes Shah, Pratik D, DO  HYDROcodone-acetaminophen (NORCO/VICODIN) 5-325 MG tablet Take 1 tablet by mouth every 6 (six) hours as needed for moderate  pain.   Yes [provider]  hydrOXYzine (ATARAX/VISTARIL) 25 MG tablet Take 25-50 mg by mouth daily as needed for itching. 06/26/20  Yes [provider]  isosorbide mononitrate (IMDUR) 30 MG 24 hr tablet TAKE 1/2 TABLET ONE TIME DAILY. Needs visit by early summer Patient taking differently: Take 15 mg by mouth daily. 10/11/20  Yes Kathyrn Drown, MD  levothyroxine (SYNTHROID) 125 MCG tablet TAKE 1/2 TABLET ON SUNDAYS AND ONE TABLET ALL OTHER DAYS. Needs visit by early summer. Patient taking differently: Take 62.5-125 mcg by mouth See admin instructions. TAKE 1/2 TABLET ON SUNDAYS AND ONE TABLET ALL OTHER DAYS. Needs visit by early summer. 10/11/20  Yes Luking, Scott A, MD  lidocaine (LIDODERM) 5 % Place 1 patch onto the skin daily as needed (pain). 03/07/19  Yes [provider]  metoprolol succinate (TOPROL-XL) 50 MG 24 hr tablet TAKE 1 TABLET (50 MG TOTAL) BY MOUTH DAILY. TAKE WITH OR IMMEDIATELY  FOLLOWING A MEAL. 02/07/20 05/07/20 Yes Satira Sark, MD  nitroGLYCERIN (NITROSTAT) 0.4 MG SL tablet DISSOLVE 1 TABLET UNDER TONGUE EVERY 5 MINUTES FOR UP TO 3 DOSES AS NEEDED FOR CHEST PAIN. Patient taking differently: Place 0.4 mg under the tongue every 5 (five) minutes as needed for chest pain. 05/16/19  Yes Luking, Scott A, MD  pantoprazole (PROTONIX) 40 MG tablet TAKE 1 TABLET EVERY DAY. Needs visit by early summer Patient taking differently: Take 40 mg by mouth daily. 10/11/20  Yes Luking, Elayne Snare, MD  tiZANidine (ZANAFLEX) 4 MG tablet Take 1-2 tablets by mouth at bedtime as needed for muscle spasms. 03/17/19  Yes [provider]  zinc sulfate 220 (50 Zn) MG capsule Take 1 capsule (220 mg total) by mouth daily. 10/14/20 11/13/20 Yes Manuella Ghazi, Pratik D, DO    Physical Exam: Vitals:   10/21/20 0527 10/21/20 0530 10/21/20 0630 10/21/20 0700  BP:  108/64 115/73 108/72  Pulse:  86 78 82  Resp:  (!) 22 (!) 30 (!) 24  Temp:  97.8 F (36.6 C)    TempSrc:  Oral    SpO2: 98% 96% 100% 100%  Weight:      Height:       Constitutional: Well-developed well-nourished, chronically ill-appearing, appears anxious, NAD, calm, comfortable Eyes: PERRL, lids and conjunctivae normal ENMT: Mucous membranes are moist. Posterior pharynx clear of any exudate or lesions.  Neck: normal, supple, no masses, no thyromegaly Respiratory: Good air movement bilaterally with scattered Rales mostly heard in the right lower lobe and expiratory wheezes fine crackles heard diffusely.  Moderate increased work of breathing. Cardiovascular: normal s1, s2 sounds, no murmurs / rubs / gallops. No extremity edema. 2+ pedal pulses. No carotid bruits.  Abdomen: no tenderness, no masses palpated. No hepatosplenomegaly. Bowel sounds positive.  Musculoskeletal: no clubbing / cyanosis. No joint deformity upper and lower extremities. Good ROM, no contractures. Normal muscle tone.  Skin: no rashes, lesions, ulcers. No  induration Neurologic: CN 2-12 grossly intact. Sensation intact, DTR normal. Strength 5/5 in all 4.  Psychiatric: Normal judgment and insight. Alert and oriented x 3. Normal mood.   Labs on Admission: I have personally reviewed following labs and imaging studies  CBC: Recent Labs  Lab 10/16/20 0944 10/21/20 0548  WBC 17.0* 21.0*  NEUTROABS 12.9* 14.9*  HGB 13.0 13.3  HCT 40.3 43.2  MCV 87 90.6  PLT 447 734*   Basic Metabolic Panel: Recent Labs  Lab 10/16/20 0944 10/21/20 0548  NA 142 133*  K 4.0 4.3  CL 101 99  CO2 18* 24  GLUCOSE 98 136*  BUN 18 17  CREATININE 0.90 0.82  CALCIUM 9.2 8.5*   GFR: Estimated Creatinine Clearance: 83.3 mL/min (by C-G formula based on SCr of 0.82 mg/dL). Liver Function Tests: Recent Labs  Lab 10/16/20 0944 10/21/20 0548  AST 22 24  ALT 22 29  ALKPHOS 132* 104  BILITOT 0.3 0.7  PROT 7.3 7.0  ALBUMIN 3.4* 2.4*   No results for input(s): LIPASE, AMYLASE in the last 168 hours. No results for input(s): AMMONIA in the last 168 hours. Coagulation Profile: No results for input(s): INR, PROTIME in the last 168 hours. Cardiac Enzymes: No results for input(s): CKTOTAL, CKMB, CKMBINDEX, TROPONINI in the last 168 hours. BNP (last 3 results) No results for input(s): PROBNP in the last 8760 hours. HbA1C: No results for input(s): HGBA1C in the last 72 hours. CBG: No results for input(s): GLUCAP in the last 168 hours. Lipid Profile: No results for input(s): CHOL, HDL, LDLCALC, TRIG, CHOLHDL, LDLDIRECT in the last 72 hours. Thyroid Function Tests: No results for input(s): TSH, T4TOTAL, FREET4, T3FREE, THYROIDAB in the last 72 hours. Anemia Panel: No results for input(s): VITAMINB12, FOLATE, FERRITIN, TIBC, IRON, RETICCTPCT in the last 72 hours. Urine analysis:    Component Value Date/Time   COLORURINE YELLOW 09/07/2008 0912   APPEARANCEUR CLEAR 09/07/2008 0912   LABSPEC <1.005 (L) 09/07/2008 0912   PHURINE 6.5 09/07/2008 0912    GLUCOSEU NEGATIVE 09/07/2008 0912   HGBUR NEGATIVE 09/07/2008 0912   BILIRUBINUR + 09/08/2017 0832   KETONESUR NEGATIVE 09/07/2008 0912   PROTEINUR NEGATIVE 09/07/2008 0912   UROBILINOGEN 0.2 09/07/2008 0912   NITRITE NEGATIVE 09/07/2008 0912   LEUKOCYTESUR  09/07/2008 0912    NEGATIVE MICROSCOPIC NOT DONE ON URINES WITH NEGATIVE PROTEIN, BLOOD, LEUKOCYTES, NITRITE, OR GLUCOSE <1000 mg/dL.    Radiological Exams on Admission: DG Chest Port 1 View  Result Date: 10/21/2020 CLINICAL DATA:  65 year old female with shortness of breath. Status post COVID-19. 1 month ago. Suspicious left upper lobe pulmonary nodule on chest CT earlier this month. EXAM: PORTABLE CHEST 1 VIEW COMPARISON:  Chest CTA 10/12/2020 and earlier. FINDINGS: Portable AP upright view at 0559 hours. Stable lung volumes and mediastinal contours. Coarse reticulonodular opacity persists throughout both lungs, most pronounced at the left lung base. There has been mild progression of opacity in the right upper lobe from earlier this month. No superimposed pneumothorax, pleural effusion or consolidation. Visualized tracheal air column is within normal limits. No acute osseous abnormality identified. IMPRESSION: Evidence of pulmonary fibrosis as suspected on the CTA earlier this month with interval increased reticulonodular opacity in the right upper lobe favored to represent acute infectious exacerbation. Otherwise stable; spiculated left upper lobe lung nodule not visible radiographically. Electronically Signed   By: Genevie Ann M.D.   On: 10/21/2020 06:30   EKG: Independently reviewed.  Normal sinus rhythm  Assessment/Plan Principal Problem:   Acute and chronic respiratory failure with hypoxia (HCC) Active Problems:   Pulmonary fibrosis (HCC)   Hypothyroidism   Mixed hyperlipidemia   Essential hypertension   CORONARY ATHEROSCLEROSIS NATIVE CORONARY ARTERY   Fibromyalgia   GERD (gastroesophageal reflux disease)   Fatty liver disease,  nonalcoholic   Morbid obesity (HCC)   Cervical myelopathy (HCC)   B12 deficiency   Unstable angina (HCC)   Prediabetes   Dyspnea on exertion   Secondary cardiomyopathy (HCC)   Mass of upper lobe of left lung   Sleep apnea   Leukocytosis  Acute on chronic respiratory failure -Patient is had progressive increasing oxygen requirement since she was recently discharged on 10/12/2020.  -Chest x-ray with new findings of infectious cause -Continue HCAP treatment and supportive measures. -Repeat chest x-ray in a.m.  Pulmonary fibrosis and new lung mass -Patient has been referred to pulmonology clinic by PCP. -Continue IV steroids and neb treatments as ordered. -Continue respiratory support as ordered.  OSA -Offer nightly CPAP while in hospital.  Coronary artery disease with history of unstable angina -Status post stent placement -Resume all home cardiac medications -HS troponin reassuring, ACS ruled out  Hypothyroidism  - resume home thyroid supplement  Leukocytosis  - From recent outpatient steroids - Follow CBC with diff  GERD - protonix ordered for GI protection    Prediabetes - As evidenced by an A1c of 6.4% - Anticipating steroid induced hyperglycemia  - SSI coverage ordered with CBG testing  Lung mass - Pt has seen Dr. Delton Coombes and planning for PET scan and bronchoscopy  GAD - resume home alprazolam as needed for symptoms   DNR present on admission  - continue DNR order while inpatient    DVT prophylaxis: enoxaparin   Code Status: DNR   Family Communication:   Disposition Plan: home   Consults called:   Admission status: INP  Level of care: Telemetry Irwin Brakeman MD [facility] How to contact the [facility] Attending or Consulting provider [place] or covering provider during after hours 7P -7A, for this patient?  1. Check the care team in [facility] and look for a) attending/consulting [facility] provider listed and b) the [facility] team listed 2. Log into  [email or web address] and use [place]'s universal password to access. If you do not have the password, please contact the hospital operator. 3. Locate the [facility] provider you are looking for under [facility] and page to a number that you can be directly reached. 4. If you still have difficulty reaching the provider, please page the [facility] (Director on Call) for the Hospitalists listed on amion for assistance.   If 7PM-7AM, please contact night-coverage [email or web address] Password [facility]  10/21/2020, 7:55 AM

## 2020-10-21 NOTE — Progress Notes (Signed)
Patient on CPAP 8 , with  10 liters bled in. This finally brought  her saturation to 90. Patient shows no distress with her saturation at 86 on 6 liters oxygen. She does not wear CPAP at home. She recently obtained home O2 , 3.5 liters.

## 2020-10-21 NOTE — ED Provider Notes (Signed)
[facility] Provider Note   CSN: [id number] Arrival date & time: 10/21/20  0518     History Chief Complaint  Patient presents with  . Shortness of Breath    [name] is a 65 y.o. female.  Patient is a 65 year old female with extensive past medical history including COPD, coronary artery disease with stents, hypertension, fibromyalgia, hyperlipidemia, hypothyroidism, and recent admission for COVID-19 pneumonia.  During this recent admission, she was found to have pulmonary fibrosis as well as what appears to be a spiculated mass in her left upper lobe.  Patient is awaiting referral to oncology for this.  She was discharged from this recent hospitalization on oxygen.  She is normally on 3.5 L/min.  Patient presents this evening with shortness of breath that has worsened over the past 2 days.  Patient states when she is lying still she does not feel short of breath, however becomes dyspneic with changing position and slight exertion.  She denies any chest pain.  She reports some cough that is occasionally productive.  She denies any leg swelling.  The history is provided by the patient.       Past Medical History:  Diagnosis Date  . Anxiety   . Arthritis   . Asthma   . Cancer ([place])    lung  . Coronary atherosclerosis of native coronary artery    a. s/p DES to LAD and D1 in 2005 b. cath in 11/2015 showing patent stents with mild disease along RCA and LCx  . Depression    Prior suicide attempt  . Essential hypertension   . Fibromyalgia   . GERD (gastroesophageal reflux disease)   . History of blood transfusion 2005  . History of kidney stones   . Hyperlipidemia   . Hypothyroidism   . Myocardial infarction ([place]) 2017  . NSTEMI (non-ST elevated myocardial infarction) ([place]) 2005  . Palpitations    PVC's have been noted  . Pulmonary fibrosis ([place])   . Sleep apnea     Patient Active Problem List   Diagnosis Date Noted  . Mass of upper lobe of  left lung 10/17/2020  . Secondary cardiomyopathy ([place]) 10/15/2020  . Acute respiratory failure with hypoxia ([place]) 10/12/2020  . Lung mass 10/12/2020  . Pneumonia due to COVID-19 virus 10/12/2020  . Hypokalemia 10/12/2020  . Dyspnea on exertion 09/21/2020  . Acute bacterial rhinosinusitis 07/17/2020  . Prediabetes 07/17/2020  . Fatigue 07/17/2020  . Need for vaccination 06/27/2020  . Breast abscess of female 06/15/2020  . Bronchitis with wheezing 06/15/2020  . Yeast infection of the vagina 06/15/2020  . Unstable angina ([place]) 10/21/2017  . Screening mammogram, encounter for 10/09/2017  . B12 deficiency 03/17/2017  . Bradycardia   . NSTEMI (non-ST elevated myocardial infarction) ([place]) 11/21/2015  . Elevated troponin   . Chest pain on exertion 11/20/2015  . Cervical myelopathy ([place]) 09/04/2015  . Morbid obesity ([place]) 09/12/2014  . External hemorrhoid 04/30/2014  . Irritable bowel syndrome 04/30/2014  . Fatty liver disease, nonalcoholic 60/05/9322  . Impaired fasting glucose 11/18/2013  . Anxiety and depression 11/10/2013  . GERD (gastroesophageal reflux disease) 11/10/2013  . Scalp psoriasis 12/29/2012  . Fibromyalgia 12/29/2012  . Essential hypertension, benign 02/08/2010  . Hypothyroidism 03/08/2009  . Mixed hyperlipidemia 03/08/2009  . CORONARY ATHEROSCLEROSIS NATIVE CORONARY ARTERY 03/08/2009    Past Surgical History:  Procedure Laterality Date  . CARDIAC CATHETERIZATION  ~ 2009/2010  . CARDIAC CATHETERIZATION N/A 11/21/2015   Procedure: Left Heart Cath and  Coronary Angiography;  Surgeon: Burnell Blanks, MD;  Location: [facility];  Service: Cardiovascular;  Laterality: N/A;  . CARDIAC CATHETERIZATION  10/21/2017  . CARPAL TUNNEL RELEASE Bilateral 2007- 2008  . CORONARY ANGIOPLASTY WITH STENT PLACEMENT  2005  . FOOT BONE EXCISION     Sesmoid bone left foot-repair of fracture  . LEFT HEART CATH AND CORONARY ANGIOGRAPHY N/A 10/21/2017   Procedure: LEFT HEART  CATH AND CORONARY ANGIOGRAPHY;  Surgeon: Wellington Hampshire, MD;  Location: [facility];  Service: Cardiovascular;  Laterality: N/A;  . TUBAL LIGATION       OB History   No obstetric history on file.     Family History  Problem Relation Age of Onset  . Coronary artery disease Father        MI age 22  . Heart failure Father   . Heart failure Mother   . Anesthesia problems Neg Hx   . Hypotension Neg Hx   . Malignant hyperthermia Neg Hx   . Pseudochol deficiency Neg Hx     Social History   Tobacco Use  . Smoking status: Former Smoker    Packs/day: 0.33    Years: 49.00    Pack years: 16.17    Types: Cigarettes    Start date: 05/31/1969    Quit date: 09/19/2020    Years since quitting: 0.0  . Smokeless tobacco: Never Used  Vaping Use  . Vaping Use: Every day  Substance Use Topics  . Alcohol use: No    Alcohol/week: 0.0 standard drinks  . Drug use: No    Home Medications Prior to Admission medications   Medication Sig Start Date End Date Taking? Authorizing Provider  acetaminophen (TYLENOL) 325 MG tablet Take 650 mg by mouth every 6 (six) hours as needed. Patient not taking: No sig reported    [provider]  albuterol (VENTOLIN HFA) 108 (90 Base) MCG/ACT inhaler Inhale 2 puffs into the lungs every 6 (six) hours as needed for wheezing or shortness of breath. Patient not taking: No sig reported 06/26/20   Chalmers Guest, NP  ALPRAZolam Duanne Moron) 0.5 MG tablet Take one tablet daily as needed for anxiety. Patient not taking: No sig reported 06/27/20   Mozingo, Berdie Ogren, NP  amitriptyline (ELAVIL) 25 MG tablet Take 1 tablet by mouth at bedtime. Patient not taking: No sig reported 03/17/19   [provider]  ascorbic acid (VITAMIN C) 500 MG tablet Take 1 tablet (500 mg total) by mouth daily. 10/14/20 11/13/20  Manuella Ghazi, Pratik D, DO  atorvastatin (LIPITOR) 40 MG tablet TAKE 1 TABLET EVERY DAY (STOP THE 80MG ) Patient taking differently: Take 20 mg by  mouth daily. TAKE 1 TABLET EVERY DAY (STOP THE 80MG ) 07/17/20   Chalmers Guest, NP  azithromycin (ZITHROMAX Z-PAK) 250 MG tablet Take 2 tablets (500 mg) on  Day 1,  followed by 1 tablet (250 mg) once daily on Days 2 through 5. 10/18/20   Luking, Elayne Snare, MD  benzonatate (TESSALON) 200 MG capsule Take 1 capsule (200 mg total) by mouth 3 (three) times daily as needed for cough. Swallow whole, do not chew Patient not taking: No sig reported 09/21/20   Triplett, Tammy, PA-C  Biotin 10000 MCG TABS Take 1 tablet by mouth daily.    [provider]  clopidogrel (PLAVIX) 75 MG tablet TAKE 1 TABLET EVERY DAY 06/18/20   Kathyrn Drown, MD  Cyanocobalamin (VITAMIN B-12 PO) Take 1 tablet by mouth daily.  [provider]  FLUoxetine (PROZAC) 20 MG capsule Take 3 capsule po daily 07/17/20   Chalmers Guest, NP  guaiFENesin-dextromethorphan (ROBITUSSIN DM) 100-10 MG/5ML syrup Take 10 mLs by mouth every 4 (four) hours as needed for cough. Patient not taking: No sig reported 10/13/20   Heath Lark D, DO  HYDROcodone-acetaminophen (NORCO/VICODIN) 5-325 MG tablet Take 1 tablet by mouth every 6 (six) hours as needed for moderate pain. Patient not taking: No sig reported    [provider]  hydrOXYzine (ATARAX/VISTARIL) 25 MG tablet Take 25-50 mg by mouth daily as needed. Patient not taking: No sig reported 06/26/20   [provider]  isosorbide mononitrate (IMDUR) 30 MG 24 hr tablet TAKE 1/2 TABLET ONE TIME DAILY. Needs visit by early summer 10/11/20   Kathyrn Drown, MD  levothyroxine (SYNTHROID) 125 MCG tablet TAKE 1/2 TABLET ON SUNDAYS AND ONE TABLET ALL OTHER DAYS. Needs visit by early summer. 10/11/20   Kathyrn Drown, MD  lidocaine (LIDODERM) 5 % Place 1 patch onto the skin daily as needed. Patient not taking: No sig reported 03/07/19   [provider]  metoprolol succinate (TOPROL-XL) 50 MG 24 hr tablet TAKE 1 TABLET (50 MG TOTAL) BY MOUTH DAILY. TAKE WITH OR  IMMEDIATELY FOLLOWING A MEAL. 02/07/20 05/07/20  Satira Sark, MD  nitroGLYCERIN (NITROSTAT) 0.4 MG SL tablet DISSOLVE 1 TABLET UNDER TONGUE EVERY 5 MINUTES FOR UP TO 3 DOSES AS NEEDED FOR CHEST PAIN. Patient not taking: No sig reported 05/16/19   Kathyrn Drown, MD  pantoprazole (PROTONIX) 40 MG tablet TAKE 1 TABLET EVERY DAY. Needs visit by early summer 10/11/20   Luking, Elayne Snare, MD  PROLENSA 0.07 % SOLN Place 1 drop into the right eye at bedtime. 08/18/19   [provider]  tiZANidine (ZANAFLEX) 4 MG tablet Take 1-2 tablets by mouth daily. 03/17/19   [provider]  zinc sulfate 220 (50 Zn) MG capsule Take 1 capsule (220 mg total) by mouth daily. 10/14/20 11/13/20  Heath Lark D, DO    Allergies    Cymbalta [duloxetine hcl] and Lyrica [pregabalin]  Review of Systems   Review of Systems  All other systems reviewed and are negative.   Physical Exam Updated Vital Signs BP 108/64 (BP Location: Left Arm)   Pulse 86   Temp 97.8 F (36.6 C) (Oral)   Resp (!) 22   Ht 5\' 6"  (1.676 m)   Wt 106.6 kg   SpO2 96%   BMI 37.93 kg/m   Physical Exam Vitals and nursing note reviewed.  Constitutional:      General: She is not in acute distress.    Appearance: She is well-developed. She is not diaphoretic.  HENT:     Head: Normocephalic and atraumatic.  Cardiovascular:     Rate and Rhythm: Normal rate and regular rhythm.     Heart sounds: No murmur heard. No friction rub. No gallop.   Pulmonary:     Effort: Pulmonary effort is normal. No respiratory distress.     Breath sounds: Examination of the right-middle field reveals rhonchi. Examination of the left-middle field reveals rhonchi. Rhonchi present. No wheezing.  Abdominal:     General: Bowel sounds are normal. There is no distension.     Palpations: Abdomen is soft.     Tenderness: There is no abdominal tenderness.  Musculoskeletal:        General: Normal range of motion.     Cervical back: Normal range of  motion  and neck supple.     Right lower leg: No tenderness. No edema.     Left lower leg: No tenderness. No edema.  Skin:    General: Skin is warm and dry.  Neurological:     Mental Status: She is alert and oriented to person, place, and time.     ED Results / Procedures / Treatments   Labs (all labs ordered are listed, but only abnormal results are displayed) Labs Reviewed  COMPREHENSIVE METABOLIC PANEL  BRAIN NATRIURETIC PEPTIDE  CBC WITH DIFFERENTIAL/PLATELET  TROPONIN I (HIGH SENSITIVITY)    EKG EKG Interpretation  Date/Time:  Sunday October 21 2020 05:27:21 EDT Ventricular Rate:  84 PR Interval:    QRS Duration: 92 QT Interval:  391 QTC Calculation: 463 R Axis:   -7 Text Interpretation: Sinus rhythm Borderline left axis deviation Poor R wave progression No significant change since 10/12/2020 Confirmed by Veryl Speak [phone]) on 10/21/2020 5:48:19 AM   Radiology No results found.  Procedures Procedures   Medications Ordered in ED Medications - No data to display  ED Course  I have reviewed the triage vital signs and the nursing notes.  Pertinent labs & imaging results that were available during my care of the patient were reviewed by me and considered in my medical decision making (see chart for details).    MDM Rules/Calculators/A&P  Patient is a 65 year old female with history of coronary artery disease with stents, COPD, and recent hospitalization for COVID-19.  She was subsequently found to have pulmonary fibrosis as well as a spiculated mass in the left upper lobe.  Patient presents today with complaints of shortness of breath and increased oxygen requirement.  She arrives here saturating in the mid 90s on 3-1/2 L by nasal cannula, but does desaturate when she sits upright.  Work-up initiated including chest x-ray and laboratory studies.  Patient has white count of 21,000 along with chest x-ray showing acute infectious exacerbation to the right upper  lobe.  Patient has received Solu-Medrol, albuterol, and vancomycin and cefepime for HCAP.  I feel as though patient will require admission due to increased oxygen requirement and underlying lung disease.  I have spoken with Dr. Wynetta Emery from the hospitalist service who agrees to admit.  CRITICAL CARE Performed by: Veryl Speak Total critical care time: 35 minutes Critical care time was exclusive of separately billable procedures and treating other patients. Critical care was necessary to treat or prevent imminent or life-threatening deterioration. Critical care was time spent personally by me on the following activities: development of treatment plan with patient and/or surrogate as well as nursing, discussions with consultants, evaluation of patient's response to treatment, examination of patient, obtaining history from patient or surrogate, ordering and performing treatments and interventions, ordering and review of laboratory studies, ordering and review of radiographic studies, pulse oximetry and re-evaluation of patient's condition.   Final Clinical Impression(s) / ED Diagnoses Final diagnoses:  None    Rx / DC Orders ED Discharge Orders    None       Veryl Speak, MD 10/21/20 [phone]

## 2020-10-21 NOTE — ED Notes (Signed)
Called to give report, nurse unable to take at this time. Will call back when available.

## 2020-10-21 NOTE — ED Notes (Signed)
ED Provider at bedside. 

## 2020-10-21 NOTE — ED Triage Notes (Signed)
Pt called EMS for SOB that is worse with movement. Pt on 3 1/2 L [place] at home. Pt had COVID at the beginning of Feb, also dx with lung CA and lung fibrosis recently.

## 2020-10-22 ENCOUNTER — Inpatient Hospital Stay (HOSPITAL_COMMUNITY): Payer: Medicare HMO

## 2020-10-22 ENCOUNTER — Ambulatory Visit (HOSPITAL_COMMUNITY): Payer: Medicare HMO | Admitting: Hematology

## 2020-10-22 DIAGNOSIS — E038 Other specified hypothyroidism: Secondary | ICD-10-CM

## 2020-10-22 DIAGNOSIS — G959 Disease of spinal cord, unspecified: Secondary | ICD-10-CM

## 2020-10-22 DIAGNOSIS — E782 Mixed hyperlipidemia: Secondary | ICD-10-CM

## 2020-10-22 LAB — CBC WITH DIFFERENTIAL/PLATELET
Abs Immature Granulocytes: 0.25 10*3/uL — ABNORMAL HIGH (ref 0.00–0.07)
Basophils Absolute: 0.1 10*3/uL (ref 0.0–0.1)
Basophils Relative: 0 %
Eosinophils Absolute: 0 10*3/uL (ref 0.0–0.5)
Eosinophils Relative: 0 %
HCT: 41.1 % (ref 36.0–46.0)
Hemoglobin: 12.7 g/dL (ref 12.0–15.0)
Immature Granulocytes: 1 %
Lymphocytes Relative: 5 %
Lymphs Abs: 1.2 10*3/uL (ref 0.7–4.0)
MCH: 27.9 pg (ref 26.0–34.0)
MCHC: 30.9 g/dL (ref 30.0–36.0)
MCV: 90.1 fL (ref 80.0–100.0)
Monocytes Absolute: 0.7 10*3/uL (ref 0.1–1.0)
Monocytes Relative: 3 %
Neutro Abs: 20.6 10*3/uL — ABNORMAL HIGH (ref 1.7–7.7)
Neutrophils Relative %: 91 %
Platelets: 429 10*3/uL — ABNORMAL HIGH (ref 150–400)
RBC: 4.56 MIL/uL (ref 3.87–5.11)
RDW: 15.9 % — ABNORMAL HIGH (ref 11.5–15.5)
WBC: 22.8 10*3/uL — ABNORMAL HIGH (ref 4.0–10.5)
nRBC: 0 % (ref 0.0–0.2)

## 2020-10-22 LAB — COMPREHENSIVE METABOLIC PANEL
ALT: 25 U/L (ref 0–44)
AST: 17 U/L (ref 15–41)
Albumin: 2.4 g/dL — ABNORMAL LOW (ref 3.5–5.0)
Alkaline Phosphatase: 101 U/L (ref 38–126)
Anion gap: 10 (ref 5–15)
BUN: 19 mg/dL (ref 8–23)
CO2: 21 mmol/L — ABNORMAL LOW (ref 22–32)
Calcium: 8.7 mg/dL — ABNORMAL LOW (ref 8.9–10.3)
Chloride: 103 mmol/L (ref 98–111)
Creatinine, Ser: 0.8 mg/dL (ref 0.44–1.00)
GFR, Estimated: >60 mL/min (ref >60)
Glucose, Bld: 198 mg/dL — ABNORMAL HIGH (ref 70–99)
Potassium: 3.6 mmol/L (ref 3.5–5.1)
Sodium: 134 mmol/L — ABNORMAL LOW (ref 135–145)
Total Bilirubin: 0.5 mg/dL (ref 0.3–1.2)
Total Protein: 6.9 g/dL (ref 6.5–8.1)

## 2020-10-22 LAB — GLUCOSE, CAPILLARY
Glucose-Capillary: 184 mg/dL — ABNORMAL HIGH (ref 70–99)
Glucose-Capillary: 185 mg/dL — ABNORMAL HIGH (ref 70–99)
Glucose-Capillary: 156 mg/dL — ABNORMAL HIGH (ref 70–99)
Glucose-Capillary: 252 mg/dL — ABNORMAL HIGH (ref 70–99)
Glucose-Capillary: 207 mg/dL — ABNORMAL HIGH (ref 70–99)

## 2020-10-22 LAB — MAGNESIUM: Magnesium: 2.2 mg/dL (ref 1.7–2.4)

## 2020-10-22 MED ORDER — ACETAMINOPHEN 325 MG PO TABS
650.0000 mg | ORAL_TABLET | Freq: Four times a day (QID) | ORAL | Status: DC | PRN
  Administered 2020-10-22 – 2020-10-24 (×3): 650 mg via ORAL
  Filled 2020-10-22 (×3): qty 2

## 2020-10-22 NOTE — Progress Notes (Signed)
PROGRESS NOTE   [name]  NLG:[id number] DOB: Dec 14, 1955 DOA: 10/21/2020 PCP: Kathyrn Drown, MD   Chief Complaint  Patient presents with  . Shortness of Breath   Level of care: Telemetry  Brief Admission History:  65 y.o. female with medical history significant for COPD and pulmonary fibrosis from 35 years of smoking (stopped 2/22), newly discovered lung mass likely primary lung cancer, CAD s/p MI with stenting, GERD, Depression, Fibromyalgia, morbid obesity, HTN, prediabetes, OSA, hypothyroidism, hyperlipidemia, GAD, arthritis presented to ED with increasing shortness of breath symptoms.  Pt recovering from Covid infection diagnosed 0/81/44 complicated by pneumonia.   She reports that she has been working with her primary care physician and was started on supplemental oxygen recently due to having hypoxia at home with a pulse ox in the 70s.  Her oxygen has increased from an initial 2 L requirement up to 3.5 L at home.  She presented to the emergency department because she continued to have symptoms of shortness of breath even after she had escalated the oxygen to 3.5 L.  She denies fever and chills.  Assessment & Plan:   Principal Problem:   Acute and chronic respiratory failure with hypoxia (HCC) Active Problems:   Pulmonary fibrosis (HCC)   Hypothyroidism   Mixed hyperlipidemia   Essential hypertension   CORONARY ATHEROSCLEROSIS NATIVE CORONARY ARTERY   Fibromyalgia   GERD (gastroesophageal reflux disease)   Fatty liver disease, nonalcoholic   Morbid obesity (HCC)   Cervical myelopathy (HCC)   B12 deficiency   Unstable angina (HCC)   Prediabetes   Dyspnea on exertion   Secondary cardiomyopathy (HCC)   Mass of upper lobe of left lung   Sleep apnea   Leukocytosis   Acute on chronic respiratory failure -Patient is having progressive oxygen requirements since she was recently discharged on 10/12/2020.  -Chest x-ray with new findings of infection.  -Continue HCAP  treatment and supportive measures. -Repeat chest x-ray with no improvement seen.  Pulmonary fibrosis and new lung mass -Patient has been referred to pulmonology clinic by PCP. -Continue IV steroids and neb treatments as ordered. -Continue respiratory support as ordered. -Pt has established with Dr. Delton Coombes for oncology care  OSA -Offer nightly CPAP while in hospital.  Coronary artery disease with history of unstable angina -Status post stent placement -Resume all home cardiac medications -HS troponin reassuring, ACS ruled out  Hypothyroidism  - resume home thyroid supplement  Leukocytosis - exacerbated by steroids - From recent outpatient steroids - Follow CBC with diff  GERD - protonix ordered for GI protection    Prediabetes - As evidenced by an A1c of 6.4% - Anticipating steroid induced hyperglycemia  - SSI coverage ordered with CBG testing  Lung mass - Pt has seen Dr. Delton Coombes and planning for PET scan and bronchoscopy  GAD - resume home alprazolam as needed for symptoms   DNR present on admission  - continue DNR order while inpatient    DVT prophylaxis: enoxaparin   Code Status: DNR   Family Communication:   Disposition Plan: home   Consults called:  pulmonary Admission status: INP   Remains inpatient appropriate because:IV treatments appropriate due to intensity of illness or inability to take PO and Inpatient level of care appropriate due to severity of illness   Dispo: The patient is from: Home              Anticipated d/c is to: Home  Patient currently is not medically stable to d/c.   Difficult to place patient No  Consultants:   pulmonary  Procedures:   N/a   Antimicrobials:  Ceftriaxone 3/20> Azithromycin 3/20>   Subjective: Pt says she feels about the same.   Objective: Vitals:   10/22/20 0330 10/22/20 0513 10/22/20 0746 10/22/20 1157  BP:  118/71    Pulse:  90    Resp:  19    Temp:  97.8 F  (36.6 C)    TempSrc:      SpO2: (!) 84% 94% 90% (!) 86%  Weight:      Height:        Intake/Output Summary (Last 24 hours) at 10/22/2020 1219 Last data filed at 10/22/2020 0500 Gross per 24 hour  Intake 1111.07 ml  Output 1650 ml  Net -538.93 ml   Filed Weights   10/21/20 0524  Weight: 106.6 kg    Examination:  General exam: awake, alert, cooperative, Appears calm and comfortable  Respiratory system: Moderate increased work of breathing, better air movement. Cardiovascular system: normal S1 & S2 heard. No JVD, murmurs, rubs, gallops or clicks. No pedal edema. Gastrointestinal system: Abdomen is nondistended, soft and nontender. No organomegaly or masses felt. Normal bowel sounds heard. Central nervous system: Alert and oriented. No focal neurological deficits. Extremities: Symmetric 5 x 5 power. Skin: No rashes, lesions or ulcers Psychiatry: Judgement and insight appear normal. Mood & affect appropriate.   Data Reviewed: I have personally reviewed following labs and imaging studies  CBC: Recent Labs  Lab 10/16/20 0944 10/21/20 0548 10/22/20 0351  WBC 17.0* 21.0* 22.8*  NEUTROABS 12.9* 14.9* 20.6*  HGB 13.0 13.3 12.7  HCT 40.3 43.2 41.1  MCV 87 90.6 90.1  PLT 447 441* 429*    Basic Metabolic Panel: Recent Labs  Lab 10/16/20 0944 10/21/20 0548 10/22/20 0351  NA 142 133* 134*  K 4.0 4.3 3.6  CL 101 99 103  CO2 18* 24 21*  GLUCOSE 98 136* 198*  BUN 18 17 19   CREATININE 0.90 0.82 0.80  CALCIUM 9.2 8.5* 8.7*  MG  --   --  2.2    GFR: Estimated Creatinine Clearance: 85.4 mL/min (by C-G formula based on SCr of 0.8 mg/dL).  Liver Function Tests: Recent Labs  Lab 10/16/20 0944 10/21/20 0548 10/22/20 0351  AST 22 24 17   ALT 22 29 25   ALKPHOS 132* 104 101  BILITOT 0.3 0.7 0.5  PROT 7.3 7.0 6.9  ALBUMIN 3.4* 2.4* 2.4*    CBG: Recent Labs  Lab 10/21/20 1630 10/21/20 2151 10/22/20 0318 10/22/20 0759 10/22/20 1145  GLUCAP 225* 231* 184* 185* 156*     Recent Results (from the past 240 hour(s))  Blood Culture (routine x 2)     Status: Abnormal   Collection Time: 10/12/20  5:10 PM   Specimen: Right Antecubital; Blood  Result Value Ref Range Status   Specimen Description   Final    RIGHT ANTECUBITAL Performed at [facility], [address], [place], [place] [address]    Special Requests   Final    BOTTLES DRAWN AEROBIC AND ANAEROBIC Blood Culture adequate volume Performed at [facility], [address] [place], [place] [address]    Culture  Setup Time   Final    GRAM POSITIVE COCCI AEROBIC BOTTLE ONLY Gram Stain Report Called to,Read Back By and Verified With: LYNORD T. @ 1601 ON [id number] BY HENDERSON L.  Organism ID to follow CRITICAL RESULT CALLED TO,  READ BACK BY AND VERIFIED WITH: DR Earnest Conroy [facility] @0953  10/14/20 EB    Culture (A)  Final    STAPHYLOCOCCUS HOMINIS THE SIGNIFICANCE OF ISOLATING THIS ORGANISM FROM A SINGLE SET OF BLOOD CULTURES WHEN MULTIPLE SETS ARE DRAWN IS UNCERTAIN. PLEASE NOTIFY THE MICROBIOLOGY DEPARTMENT WITHIN ONE WEEK IF SPECIATION AND SENSITIVITIES ARE REQUIRED. Performed at [facility], [place] [address], [place], [place] [address]    Report Status 10/15/2020 FINAL  Final  Blood Culture (routine x 2)     Status: None   Collection Time: 10/12/20  5:10 PM   Specimen: BLOOD RIGHT HAND  Result Value Ref Range Status   Specimen Description   Final    BLOOD RIGHT HAND Performed at [facility], [address] [place], [place] [address]    Special Requests   Final    BOTTLES DRAWN AEROBIC AND ANAEROBIC Blood Culture adequate volume Performed at [facility], [address] [place], [place] [address]    Culture   Final    NO GROWTH 5 DAYS Performed at [facility], [address] [place], [place] [address]    Report Status 10/17/2020 FINAL  Final  Blood Culture ID Panel (Reflexed)     Status: Abnormal   Collection Time: 10/12/20  5:10 PM  Result Value Ref Range  Status   Enterococcus faecalis NOT DETECTED NOT DETECTED Final   Enterococcus Faecium NOT DETECTED NOT DETECTED Final   Listeria monocytogenes NOT DETECTED NOT DETECTED Final   Staphylococcus species DETECTED (A) NOT DETECTED Final    Comment: [facility]BV [facility] MD @0953  10/14/20 EB    Staphylococcus aureus (BCID) NOT DETECTED NOT DETECTED Final   Staphylococcus epidermidis NOT DETECTED NOT DETECTED Final   Staphylococcus lugdunensis NOT DETECTED NOT DETECTED Final   Streptococcus species NOT DETECTED NOT DETECTED Final   Streptococcus agalactiae NOT DETECTED NOT DETECTED Final   Streptococcus pneumoniae NOT DETECTED NOT DETECTED Final   Streptococcus pyogenes NOT DETECTED NOT DETECTED Final   A.calcoaceticus-baumannii NOT DETECTED NOT DETECTED Final   Bacteroides fragilis NOT DETECTED NOT DETECTED Final   Enterobacterales NOT DETECTED NOT DETECTED Final   Enterobacter cloacae complex NOT DETECTED NOT DETECTED Final   Escherichia coli NOT DETECTED NOT DETECTED Final   Klebsiella aerogenes NOT DETECTED NOT DETECTED Final   Klebsiella oxytoca NOT DETECTED NOT DETECTED Final   Klebsiella pneumoniae NOT DETECTED NOT DETECTED Final   Proteus species NOT DETECTED NOT DETECTED Final   Salmonella species NOT DETECTED NOT DETECTED Final   Serratia marcescens NOT DETECTED NOT DETECTED Final   Haemophilus influenzae NOT DETECTED NOT DETECTED Final   Neisseria meningitidis NOT DETECTED NOT DETECTED Final   Pseudomonas aeruginosa NOT DETECTED NOT DETECTED Final   Stenotrophomonas maltophilia NOT DETECTED NOT DETECTED Final   Candida albicans NOT DETECTED NOT DETECTED Final   Candida auris NOT DETECTED NOT DETECTED Final   Candida glabrata NOT DETECTED NOT DETECTED Final   Candida krusei NOT DETECTED NOT DETECTED Final   Candida parapsilosis NOT DETECTED NOT DETECTED Final   Candida tropicalis NOT DETECTED NOT DETECTED Final   Cryptococcus neoformans/gattii NOT DETECTED NOT DETECTED Final     Comment: Performed at [facility], [place]. [address] [place], [place] [address]     Radiology Studies: DG Chest [place] 1 View  Result Date: 10/22/2020 CLINICAL DATA:  65 year old female with shortness of breath status post COVID-19 1 month ago. Pulmonary fibrosis suspected on CTA. Respiratory failure. EXAM: PORTABLE CHEST 1 VIEW COMPARISON:  Portable chest 10/21/2020 and earlier. FINDINGS: Stable lung volumes and mediastinal contours. Asymmetric coarse pulmonary interstitial opacity is stable from yesterday, and remains mildly increased in the right upper lobe compared to 10/12/2020. Spiculated left upper lung nodule remains occult by x-ray. No pneumothorax, pleural effusion or areas of worsening ventilation. Visualized tracheal air column is within normal limits. Stable visualized osseous structures. IMPRESSION: 1. Suspected pulmonary fibrosis with continued increased reticulonodular opacity in the right upper lobe compared to earlier this month, possibly acute infectious exacerbation. 2. Stable since yesterday. Electronically Signed   By: Genevie Ann M.D.   On: 10/22/2020 05:42   DG Chest Port 1 View  Result Date: 10/21/2020 CLINICAL DATA:  65 year old female with shortness of breath. Status post COVID-19. 1 month ago. Suspicious left upper lobe pulmonary nodule on chest CT earlier this month. EXAM: PORTABLE CHEST 1 VIEW COMPARISON:  Chest CTA 10/12/2020 and earlier. FINDINGS: Portable AP upright view at 0559 hours. Stable lung volumes and mediastinal contours. Coarse reticulonodular opacity persists throughout both lungs, most pronounced at the left lung base. There has been mild progression of opacity in the right upper lobe from earlier this month. No superimposed pneumothorax, pleural effusion or consolidation. Visualized tracheal air column is within normal limits. No acute osseous abnormality identified. IMPRESSION: Evidence of pulmonary fibrosis as suspected on the CTA earlier this month with  interval increased reticulonodular opacity in the right upper lobe favored to represent acute infectious exacerbation. Otherwise stable; spiculated left upper lobe lung nodule not visible radiographically. Electronically Signed   By: Genevie Ann M.D.   On: 10/21/2020 06:30    Scheduled Meds: . ascorbic acid  500 mg Oral Daily  . atorvastatin  20 mg Oral QPM  . clopidogrel  75 mg Oral Daily  . enoxaparin (LOVENOX) injection  40 mg Subcutaneous Q24H  . FLUoxetine  60 mg Oral Daily  . insulin aspart  0-15 Units Subcutaneous TID WC  . insulin aspart  4 Units Subcutaneous TID WC  . ipratropium-albuterol  3 mL Nebulization Q4H  . isosorbide mononitrate  15 mg Oral Daily  . levothyroxine  125 mcg Oral Once per day on Mon Tue Wed Thu Fri Sat  . levothyroxine  62.5 mcg Oral Q Sun  . methylPREDNISolone (SOLU-MEDROL) injection  80 mg Intravenous Q6H  . metoprolol succinate  50 mg Oral Daily  . nicotine  21 mg Transdermal Daily  . pantoprazole  40 mg Oral Daily  . saccharomyces boulardii  250 mg Oral BID  . zinc sulfate  220 mg Oral Daily   Continuous Infusions: . azithromycin 500 mg (10/22/20 0820)  . cefTRIAXone (ROCEPHIN)  IV 2 g (10/22/20 0951)     LOS: 1 day   Time spent: 36 mins  Shavanna Furnari Wynetta Emery, MD How to contact the [facility] Attending or Consulting provider [place] or covering provider during after hours [place], for this patient?  1. Check the care team in [facility] and look for a) attending/consulting [facility] provider listed and b) the [facility] team listed 2. Log into [email or web address] and use [place]'s universal password to access. If you do not have the password, please contact the hospital operator. 3. Locate the [facility] provider you are looking for under [facility] and page to a number that you can be directly reached. 4. If you still have difficulty reaching the provider, please page the [facility] (Director on Call) for the Hospitalists listed on amion for assistance.  10/22/2020, 12:19 PM

## 2020-10-22 NOTE — Progress Notes (Signed)
CCMD called states patient had like 3 beats of SVT , and then regular beat then a 6 beat and were sending a copy of strip into patient chart

## 2020-10-22 NOTE — Telephone Encounter (Signed)
FYI-patient currently in the hospital.  We will initiate further on this after patient is released thank you

## 2020-10-23 DIAGNOSIS — J9621 Acute and chronic respiratory failure with hypoxia: Secondary | ICD-10-CM

## 2020-10-23 DIAGNOSIS — R918 Other nonspecific abnormal finding of lung field: Secondary | ICD-10-CM | POA: Diagnosis not present

## 2020-10-23 DIAGNOSIS — J189 Pneumonia, unspecified organism: Principal | ICD-10-CM

## 2020-10-23 DIAGNOSIS — Z7189 Other specified counseling: Secondary | ICD-10-CM

## 2020-10-23 DIAGNOSIS — R0600 Dyspnea, unspecified: Secondary | ICD-10-CM

## 2020-10-23 DIAGNOSIS — J8410 Pulmonary fibrosis, unspecified: Secondary | ICD-10-CM | POA: Diagnosis not present

## 2020-10-23 DIAGNOSIS — Z515 Encounter for palliative care: Secondary | ICD-10-CM

## 2020-10-23 DIAGNOSIS — I429 Cardiomyopathy, unspecified: Secondary | ICD-10-CM

## 2020-10-23 LAB — CBC WITH DIFFERENTIAL/PLATELET
Abs Immature Granulocytes: 0.39 10*3/uL — ABNORMAL HIGH (ref 0.00–0.07)
Basophils Absolute: 0.1 10*3/uL (ref 0.0–0.1)
Basophils Relative: 0 %
Eosinophils Absolute: 0 10*3/uL (ref 0.0–0.5)
Eosinophils Relative: 0 %
HCT: 41.4 % (ref 36.0–46.0)
Hemoglobin: 12.5 g/dL (ref 12.0–15.0)
Immature Granulocytes: 1 %
Lymphocytes Relative: 4 %
Lymphs Abs: 1 10*3/uL (ref 0.7–4.0)
MCH: 27.5 pg (ref 26.0–34.0)
MCHC: 30.2 g/dL (ref 30.0–36.0)
MCV: 91 fL (ref 80.0–100.0)
Monocytes Absolute: 1 10*3/uL (ref 0.1–1.0)
Monocytes Relative: 4 %
Neutro Abs: 24.6 10*3/uL — ABNORMAL HIGH (ref 1.7–7.7)
Neutrophils Relative %: 91 %
Platelets: 452 10*3/uL — ABNORMAL HIGH (ref 150–400)
RBC: 4.55 MIL/uL (ref 3.87–5.11)
RDW: 16.1 % — ABNORMAL HIGH (ref 11.5–15.5)
WBC: 27.1 10*3/uL — ABNORMAL HIGH (ref 4.0–10.5)
nRBC: 0 % (ref 0.0–0.2)

## 2020-10-23 LAB — COMPREHENSIVE METABOLIC PANEL
ALT: 27 U/L (ref 0–44)
AST: 22 U/L (ref 15–41)
Albumin: 2.4 g/dL — ABNORMAL LOW (ref 3.5–5.0)
Alkaline Phosphatase: 108 U/L (ref 38–126)
Anion gap: 11 (ref 5–15)
BUN: 22 mg/dL (ref 8–23)
CO2: 22 mmol/L (ref 22–32)
Calcium: 9.1 mg/dL (ref 8.9–10.3)
Chloride: 104 mmol/L (ref 98–111)
Creatinine, Ser: 0.77 mg/dL (ref 0.44–1.00)
GFR, Estimated: >60 mL/min (ref >60)
Glucose, Bld: 165 mg/dL — ABNORMAL HIGH (ref 70–99)
Potassium: 4.3 mmol/L (ref 3.5–5.1)
Sodium: 137 mmol/L (ref 135–145)
Total Bilirubin: 0.4 mg/dL (ref 0.3–1.2)
Total Protein: 7 g/dL (ref 6.5–8.1)

## 2020-10-23 LAB — GLUCOSE, CAPILLARY
Glucose-Capillary: 152 mg/dL — ABNORMAL HIGH (ref 70–99)
Glucose-Capillary: 150 mg/dL — ABNORMAL HIGH (ref 70–99)
Glucose-Capillary: 188 mg/dL — ABNORMAL HIGH (ref 70–99)
Glucose-Capillary: 198 mg/dL — ABNORMAL HIGH (ref 70–99)
Glucose-Capillary: 206 mg/dL — ABNORMAL HIGH (ref 70–99)

## 2020-10-23 LAB — MAGNESIUM: Magnesium: 2.3 mg/dL (ref 1.7–2.4)

## 2020-10-23 LAB — SEDIMENTATION RATE: Sed Rate: 69 mm/hr — ABNORMAL HIGH (ref 0–22)

## 2020-10-23 MED ORDER — IPRATROPIUM-ALBUTEROL 0.5-2.5 (3) MG/3ML IN SOLN: 3.0000 mL | Freq: Four times a day (QID) | RESPIRATORY_TRACT | Status: DC | PRN

## 2020-10-23 MED ORDER — IPRATROPIUM-ALBUTEROL 0.5-2.5 (3) MG/3ML IN SOLN
3.0000 mL | Freq: Four times a day (QID) | RESPIRATORY_TRACT | Status: DC
  Administered 2020-10-23 – 2020-10-26 (×16): 3 mL via RESPIRATORY_TRACT
  Filled 2020-10-23 (×16): qty 3

## 2020-10-23 MED ORDER — BISACODYL 5 MG PO TBEC
5.0000 mg | DELAYED_RELEASE_TABLET | Freq: Every day | ORAL | Status: DC | PRN
  Administered 2020-10-23 – 2020-10-25 (×2): 5 mg via ORAL
  Filled 2020-10-23 (×2): qty 1

## 2020-10-23 NOTE — Evaluation (Signed)
Physical Therapy Evaluation Patient Details Name: [name] MRN: [id number] DOB: 1955-12-02 Today's Date: 10/23/2020   History of Present Illness  [name] is a 65 y.o. female with medical history significant for COPD and pulmonary fibrosis from 24 years of smoking (stopped 2/22), newly discovered lung mass likely primary lung cancer, CAD s/p MI with stenting, GERD, Depression, Fibromyalgia, morbid obesity, HTN, prediabetes, OSA, hypothyroidism, hyperlipidemia, GAD, arthritis presented to ED with increasing shortness of breath symptoms.  Pt recovering from Covid infection diagnosed 3/87/56 complicated by pneumonia.   She reports that she has been working with her primary care physician and was started on supplemental oxygen recently due to having hypoxia at home with a pulse ox in the 70s.  Her oxygen has increased from an initial 2 L requirement up to 3.5 L at home.  She presented to the emergency department because she continued to have symptoms of shortness of breath even after she had escalated the oxygen to 3.5 L.  She denies fever and chills.    Clinical Impression  Patient demonstrates slow labored movement for sitting up at bedside, once seated required frequent rest breaks due to SOB with SpO2 dropping on exertion, able to take a few steps to transfer to chair with SpO2 dropping from 91% to 77% while on 13 LPM O2.  Patient limited for functional activity mostly due to SOB.  Patient tolerated staying up in chair after therapy - RN aware.  Patient will benefit from continued physical therapy in hospital and recommended venue below to increase strength, balance, endurance for safe ADLs and gait.     Follow Up Recommendations Home health PT;Supervision for mobility/OOB;Supervision - Intermittent    Equipment Recommendations  None recommended by PT    Recommendations for Other Services       Precautions / Restrictions Precautions Precautions: Fall Restrictions Weight  Bearing Restrictions: No      Mobility  Bed Mobility Overal bed mobility: Needs Assistance Bed Mobility: Supine to Sit     Supine to sit: Supervision     General bed mobility comments: increased time, slightly labored movement    Transfers Overall transfer level: Needs assistance Equipment used: None Transfers: Sit to/from Stand;Stand Pivot Transfers Sit to Stand: Min guard;Supervision Stand pivot transfers: Min guard;Supervision       General transfer comment: slow labored movement  Ambulation/Gait Ambulation/Gait assistance: Min guard;Min assist Gait Distance (Feet): 5 Feet Assistive device: None Gait Pattern/deviations: Decreased step length - right;Decreased step length - left;Decreased stride length Gait velocity: decreased   General Gait Details: limited to 5-6 steps at bedside due to increasing SOB, fatigue and SpO2 dropping from 91% to 77% while on 13 LPM O2  Stairs            Wheelchair Mobility    Modified Rankin (Stroke Patients Only)       Balance Overall balance assessment: Needs assistance Sitting-balance support: Feet supported;No upper extremity supported Sitting balance-Leahy Scale: Good Sitting balance - Comments: seated at EOB   Standing balance support: During functional activity;No upper extremity supported Standing balance-Leahy Scale: Poor Standing balance comment: fair/poor without AD                             Pertinent Vitals/Pain Pain Assessment: No/denies pain    Home Living Family/patient expects to be discharged to:: Private residence Living Arrangements: Spouse/significant other;Children Available Help at Discharge: Family;Available PRN/intermittently Type of Home: House Home Access: Stairs  to enter Entrance Stairs-Rails: Right;Left;Can reach both Entrance Stairs-Number of Steps: 3-4 Home Layout: Laundry or work area in basement (Patient states she does not go into basement) Home Equipment: Environmental consultant - 4  wheels;Cane - single point;Shower seat      Prior Function Level of Independence: Needs assistance   Gait / Transfers Assistance Needed: Household ambulator using SPC PRN, on Home O2 usually 3.5 LPM  ADL's / Homemaking Assistance Needed: assisted by family        Hand Dominance        Extremity/Trunk Assessment   Upper Extremity Assessment Upper Extremity Assessment: Generalized weakness    Lower Extremity Assessment Lower Extremity Assessment: Generalized weakness    Cervical / Trunk Assessment Cervical / Trunk Assessment: Normal  Communication   Communication: No difficulties  Cognition Arousal/Alertness: Awake/alert Behavior During Therapy: WFL for tasks assessed/performed Overall Cognitive Status: Within Functional Limits for tasks assessed                                        General Comments      Exercises     Assessment/Plan    PT Assessment Patient needs continued PT services  PT Problem List Decreased strength;Decreased activity tolerance;Decreased balance;Decreased mobility       PT Treatment Interventions DME instruction;Gait training;Stair training;Functional mobility training;Therapeutic activities;Therapeutic exercise;Patient/family education;Balance training    PT Goals (Current goals can be found in the Care Plan section)  Acute Rehab PT Goals Patient Stated Goal: return home with family to assist PT Goal Formulation: With patient Time For Goal Achievement: 10/30/20 Potential to Achieve Goals: Good    Frequency Min 3X/week   Barriers to discharge        Co-evaluation               AM-PAC PT "6 Clicks" Mobility  Outcome Measure Help needed turning from your back to your side while in a flat bed without using bedrails?: None Help needed moving from lying on your back to sitting on the side of a flat bed without using bedrails?: A Little Help needed moving to and from a bed to a chair (including a wheelchair)?:  A Little Help needed standing up from a chair using your arms (e.g., wheelchair or bedside chair)?: A Little Help needed to walk in hospital room?: A Lot Help needed climbing 3-5 steps with a railing? : A Lot 6 Click Score: 17    End of Session Equipment Utilized During Treatment: Oxygen Activity Tolerance: Patient tolerated treatment well;Patient limited by fatigue Patient left: in chair;with call bell/phone within reach Nurse Communication: Mobility status PT Visit Diagnosis: Other abnormalities of gait and mobility (R26.89);Unsteadiness on feet (R26.81);Muscle weakness (generalized) (M62.81)    Time: 9935-7017 PT Time Calculation (min) (ACUTE ONLY): 31 min   Charges:   PT Evaluation $PT Eval Moderate Complexity: 1 Mod PT Treatments $Therapeutic Activity: 23-37 mins        11:41 AM, 10/23/20 Lonell Grandchild, MPT Physical Therapist with [facility] [phone] office [phone] mobile phone

## 2020-10-23 NOTE — Consult Note (Signed)
Consultation Note Date: 10/23/2020   Patient Name: [name]  DOB: [birth year]  MRN: [id number]  Age / Sex: 65 y.o., female  PCP: Kathyrn Drown, MD Referring Physician: Murlean Iba, MD  Reason for Consultation: Establishing goals of care  HPI/Patient Profile: 65 y.o.femalewith medical history significantfor COPD and pulmonary fibrosis from 71 years of smoking (stopped 2/22), newly discovered lung mass likely primary lung cancer, CAD s/p MI with stenting, GERD, Depression, Fibromyalgia, morbid obesity, HTN, prediabetes, OSA, hypothyroidism, hyperlipidemia, GAD, arthritis presented to ED with increasing shortness of breath symptoms. Pt recovering from Covid infection diagnosed 8/87/57 complicated by pneumonia. She reports that she has been working with her primary care physician and was started on supplemental oxygen recently due to having hypoxia at home with a pulse ox in the 70s. Hospital admission for acute on chronic respiratory failure due to HCAP, pulmonary fibrosis, and new lung mass, currently requiring HFNC 13L. OSA requiring HS CPAP. Patient is established with oncology Dr. Delton Coombes with plans for PET scan, brain MRI, and ? Bronch. Patient with higher oxygen requirements since hospital discharge on 10/12/20. Pending pulmonary consultation. Palliative medicine consultation for goals of care.   Clinical Assessment and Goals of Care:  I have reviewed medical records, discussed with Dr. Wynetta Emery and RN, and met with [name] at bedside to discuss goals of care. She is awake, alert, oriented and able to participate in discussion. She is sitting up in recliner. Reports dyspnea on exertion and intermittent dyspnea at rest. On HFNC 13L. Denies pain.    I introduced Palliative Medicine as specialized medical care for people living with serious illness. It focuses on providing relief from the symptoms  and stress of a serious illness. The goal is to improve quality of life for both the patient and the family.  We discussed a brief life review of the patient. Retired Emergency planning/management officer. Married with two supportive step-sons who live with her and her husband. [name] admits to smoking 50+ years of her life. She recently quit smoking when diagnosed with covid (was planning to quit March 1st). She has an excellent support system including "sister friends" that are like family. Confirms emergency contacts as her two friends Kalman Shan and Shirlean Mylar).   Discussed events leading up to admission and course of hospitalization including diagnoses, interventions, plan of care. Discussed pending pulmonology consult. Ann understands the severity of a diagnosis of pulmonary fibrosis. She shares that she is the only one to blame for the new found lung mass due to all her years of smoking. [name] speaks of plan to pursue oncology workup for options but understands this is unfortunately on hold due to acute hospitalization and respiratory failure.   I attempted to elicit values and goals of care important to the patient. [name] is calm and tells me she is not afraid. She shares that she was given 17 more years following her heart attack and she have viewed these years as a blessing. She shares that she has already outlived 95 of her family members.  She is trying to take this one day at a time.  Advanced directives, concepts specific to code status, artifical feeding and hydration were discussed. [name] confirms her decision for DNR code status and points at her bracelet. She reports completed living will but from many years ago. Introduced and discussed AD packet and MOST form. Ann plans to discuss these documents with her sister/good friend Kalman Shan) when she visits bedside this evening. Encouraged Ann to call PMT provider if interested in completing this admission.   Again discussed plan for pending pulmonary recommendations, watchful waiting  with medical management.    Questions and concerns were addressed.  Hard Choices and PMT contact information given.     SUMMARY OF RECOMMENDATIONS    DNR, otherwise continue current plan of care and medical management.  Introduced and discussed AD packet and MOST form. Patient plans to speak with her sister/good friend Maia Breslow) about these documents and consider completing this admission.  Pending pulmonology consultation.  Pending oncology w/u for lung mass. Patient still interested in oncology work-up if able.  PMT provider will continue to follow inpatient.  May benefit from intermittent chaplain support.    Code Status/Advance Care Planning:  DNR: confirmed by patient.  Symptom Management:   Per attending  Palliative Prophylaxis:   Bowel Regimen, Delirium Protocol and Frequent Pain Assessment  Psycho-social/Spiritual:   Desire for further Chaplaincy support:yes  Additional Recommendations: Caregiving  Support/Resources and Compassionate Wean Education  Prognosis:   Guarded  Discharge Planning: To Be Determined      Primary Diagnoses: Present on Admission: . Acute and chronic respiratory failure with hypoxia ([place]) . Unstable angina ([place]) . Prediabetes . Morbid obesity ([place]) . Mixed hyperlipidemia . Mass of upper lobe of left lung . Hypothyroidism . GERD (gastroesophageal reflux disease) . Fibromyalgia . Fatty liver disease, nonalcoholic . Dyspnea on exertion . CORONARY ATHEROSCLEROSIS NATIVE CORONARY ARTERY . Cervical myelopathy ([place]) . B12 deficiency . Sleep apnea . Pulmonary fibrosis ([place]) . Leukocytosis   I have reviewed the medical record, interviewed the patient and family, and examined the patient. The following aspects are pertinent.  Past Medical History:  Diagnosis Date  . Anxiety   . Arthritis   . Asthma   . Cancer ([place])    lung  . Coronary atherosclerosis of native coronary artery    a. s/p DES to LAD and D1 in 2005 b.  cath in 11/2015 showing patent stents with mild disease along RCA and LCx  . Depression    Prior suicide attempt  . Essential hypertension   . Fibromyalgia   . GERD (gastroesophageal reflux disease)   . History of blood transfusion 2005  . History of kidney stones   . Hyperlipidemia   . Hypothyroidism   . Myocardial infarction ([place]) 2017  . NSTEMI (non-ST elevated myocardial infarction) ([place]) 2005  . Palpitations    PVC's have been noted  . Pulmonary fibrosis ([place])   . Sleep apnea    Social History   Socioeconomic History  . Marital status: Married    Spouse name: Not on file  . Number of children: Not on file  . Years of education: Not on file  . Highest education level: Not on file  Occupational History  . Not on file  Tobacco Use  . Smoking status: Former Smoker    Packs/day: 0.33    Years: 49.00    Pack years: 16.17    Types: Cigarettes    Start date: 05/31/1969    Quit date:  09/19/2020    Years since quitting: 0.0  . Smokeless tobacco: Never Used  Vaping Use  . Vaping Use: Every day  Substance and Sexual Activity  . Alcohol use: No    Alcohol/week: 0.0 standard drinks  . Drug use: No  . Sexual activity: Yes    Birth control/protection: Post-menopausal  Other Topics Concern  . Not on file  Social History Narrative  . Not on file   Social Determinants of Health   Financial Resource Strain: Low Risk   . Difficulty of Paying Living Expenses: Not very hard  Food Insecurity: No Food Insecurity  . Worried About Charity fundraiser in the Last Year: Never true  . Ran Out of Food in the Last Year: Never true  Transportation Needs: Unmet Transportation Needs  . Lack of Transportation (Medical): Yes  . Lack of Transportation (Non-Medical): No  Physical Activity: Inactive  . Days of Exercise per Week: 0 days  . Minutes of Exercise per Session: 0 min  Stress: No Stress Concern Present  . Feeling of Stress : Not at all  Social Connections: Moderately Isolated   . Frequency of Communication with Friends and Family: More than three times a week  . Frequency of Social Gatherings with Friends and Family: More than three times a week  . Attends Religious Services: Never  . Active Member of Clubs or Organizations: No  . Attends Archivist Meetings: Never  . Marital Status: Married   Family History  Problem Relation Age of Onset  . Coronary artery disease Father        MI age 38  . Heart failure Father   . Heart failure Mother   . Anesthesia problems Neg Hx   . Hypotension Neg Hx   . Malignant hyperthermia Neg Hx   . Pseudochol deficiency Neg Hx    Scheduled Meds: . ascorbic acid  500 mg Oral Daily  . atorvastatin  20 mg Oral QPM  . clopidogrel  75 mg Oral Daily  . enoxaparin (LOVENOX) injection  40 mg Subcutaneous Q24H  . FLUoxetine  60 mg Oral Daily  . insulin aspart  0-15 Units Subcutaneous TID WC  . insulin aspart  4 Units Subcutaneous TID WC  . ipratropium-albuterol  3 mL Nebulization QID  . isosorbide mononitrate  15 mg Oral Daily  . levothyroxine  125 mcg Oral Once per day on Mon Tue Wed Thu Fri Sat  . levothyroxine  62.5 mcg Oral Q Sun  . methylPREDNISolone (SOLU-MEDROL) injection  80 mg Intravenous Q6H  . metoprolol succinate  50 mg Oral Daily  . nicotine  21 mg Transdermal Daily  . pantoprazole  40 mg Oral Daily  . saccharomyces boulardii  250 mg Oral BID  . zinc sulfate  220 mg Oral Daily   Continuous Infusions: . cefTRIAXone (ROCEPHIN)  IV 2 g (10/23/20 1254)   PRN Meds:.acetaminophen, ALPRAZolam, amitriptyline, bisacodyl, guaiFENesin-dextromethorphan, HYDROcodone-acetaminophen, ipratropium-albuterol, nitroGLYCERIN Medications Prior to Admission:  Prior to Admission medications   Medication Sig Start Date End Date Taking? Authorizing Provider  acetaminophen (TYLENOL) 325 MG tablet Take 650 mg by mouth every 6 (six) hours as needed for mild pain or headache.   Yes [provider]  albuterol (VENTOLIN  HFA) 108 (90 Base) MCG/ACT inhaler Inhale 2 puffs into the lungs every 6 (six) hours as needed for wheezing or shortness of breath. 06/26/20  Yes Chalmers Guest, NP  ALPRAZolam Duanne Moron) 0.5 MG tablet Take one tablet daily as needed for anxiety.  Patient taking differently: Take 0.5 mg by mouth daily as needed for anxiety. 06/27/20  Yes Mozingo, Berdie Ogren, NP  amitriptyline (ELAVIL) 25 MG tablet Take 1 tablet by mouth at bedtime as needed for sleep. 03/17/19  Yes [provider]  ascorbic acid (VITAMIN C) 500 MG tablet Take 1 tablet (500 mg total) by mouth daily. 10/14/20 11/13/20 Yes Shah, Pratik D, DO  atorvastatin (LIPITOR) 20 MG tablet Take 20 mg by mouth daily.   Yes [provider]  azithromycin (ZITHROMAX Z-PAK) 250 MG tablet Take 2 tablets (500 mg) on  Day 1,  followed by 1 tablet (250 mg) once daily on Days 2 through 5. 10/18/20  Yes Luking, Elayne Snare, MD  Biotin 10000 MCG TABS Take 1 tablet by mouth daily.   Yes [provider]  clopidogrel (PLAVIX) 75 MG tablet TAKE 1 TABLET EVERY DAY Patient taking differently: Take 75 mg by mouth daily. 06/18/20  Yes Luking, Elayne Snare, MD  Cyanocobalamin (VITAMIN B-12 PO) Take 1 tablet by mouth daily.   Yes [provider]  FLUoxetine (PROZAC) 20 MG capsule Take 3 capsule po daily Patient taking differently: Take 60 mg by mouth daily. Take 3 capsule po daily 07/17/20  Yes Dolby, Craige Cotta, NP  guaiFENesin-dextromethorphan (ROBITUSSIN DM) 100-10 MG/5ML syrup Take 10 mLs by mouth every 4 (four) hours as needed for cough. 10/13/20  Yes Shah, Pratik D, DO  HYDROcodone-acetaminophen (NORCO/VICODIN) 5-325 MG tablet Take 1 tablet by mouth every 6 (six) hours as needed for moderate pain.   Yes [provider]  hydrOXYzine (ATARAX/VISTARIL) 25 MG tablet Take 25-50 mg by mouth daily as needed for itching. 06/26/20  Yes [provider]  isosorbide mononitrate (IMDUR) 30 MG 24 hr tablet TAKE 1/2 TABLET ONE TIME DAILY.  Needs visit by early summer Patient taking differently: Take 15 mg by mouth daily. 10/11/20  Yes Kathyrn Drown, MD  levothyroxine (SYNTHROID) 125 MCG tablet TAKE 1/2 TABLET ON SUNDAYS AND ONE TABLET ALL OTHER DAYS. Needs visit by early summer. Patient taking differently: Take 62.5-125 mcg by mouth See admin instructions. TAKE 1/2 TABLET ON SUNDAYS AND ONE TABLET ALL OTHER DAYS. Needs visit by early summer. 10/11/20  Yes Luking, Scott A, MD  lidocaine (LIDODERM) 5 % Place 1 patch onto the skin daily as needed (pain). 03/07/19  Yes [provider]  metoprolol succinate (TOPROL-XL) 50 MG 24 hr tablet TAKE 1 TABLET (50 MG TOTAL) BY MOUTH DAILY. TAKE WITH OR IMMEDIATELY FOLLOWING A MEAL. 02/07/20 05/07/20 Yes Satira Sark, MD  nitroGLYCERIN (NITROSTAT) 0.4 MG SL tablet DISSOLVE 1 TABLET UNDER TONGUE EVERY 5 MINUTES FOR UP TO 3 DOSES AS NEEDED FOR CHEST PAIN. Patient taking differently: Place 0.4 mg under the tongue every 5 (five) minutes as needed for chest pain. 05/16/19  Yes Luking, Scott A, MD  pantoprazole (PROTONIX) 40 MG tablet TAKE 1 TABLET EVERY DAY. Needs visit by early summer Patient taking differently: Take 40 mg by mouth daily. 10/11/20  Yes Luking, Elayne Snare, MD  tiZANidine (ZANAFLEX) 4 MG tablet Take 1-2 tablets by mouth at bedtime as needed for muscle spasms. 03/17/19  Yes [provider]  zinc sulfate 220 (50 Zn) MG capsule Take 1 capsule (220 mg total) by mouth daily. 10/14/20 11/13/20 Yes Shah, Pratik D, DO   Allergies  Allergen Reactions  . Cymbalta [Duloxetine Hcl] Other (See Comments)    Felt bad  . Lyrica [Pregabalin] Other (See Comments)    Retained fluids   Review  of Systems  Constitutional: Positive for activity change.  Respiratory: Positive for shortness of breath.        Dyspnea with exertion   Physical Exam Vitals and nursing note reviewed.  Constitutional:      General: She is awake.  HENT:     Head: Normocephalic and atraumatic.  Pulmonary:      Comments: Dyspnea with exertion. HFNC 13L Skin:    General: Skin is warm and dry.  Neurological:     Mental Status: She is alert and oriented to person, place, and time.  Psychiatric:        Mood and Affect: Mood normal.        Speech: Speech normal.        Behavior: Behavior normal.        Cognition and Memory: Cognition normal.     Vital Signs: BP 117/68 (BP Location: Right Arm)   Pulse 64   Temp 97.7 F (36.5 C) (Oral)   Resp 20   Ht _0  (1.676 m)   Wt 106.6 kg   SpO2 100%   BMI 37.93 kg/m  Pain Scale: 0-10   Pain Score: 1    SpO2: SpO2: 100 % O2 Device:SpO2: 100 % O2 Flow Rate: .O2 Flow Rate (L/min): 14 L/min  IO: Intake/output summary:   Intake/Output Summary (Last 24 hours) at 10/23/2020 1659 Last data filed at 10/23/2020 1300 Gross per 24 hour  Intake 720 ml  Output --  Net 720 ml    LBM: Last BM Date: 10/20/20 Baseline Weight: Weight: 106.6 kg Most recent weight: Weight: 106.6 kg     Palliative Assessment/Data: PPS 50%   Flowsheet Rows   Flowsheet Row Most Recent Value  Intake Tab   Referral Department Hospitalist  Unit at Time of Referral Cardiac/Telemetry Unit  Palliative Care Primary Diagnosis Pulmonary  Palliative Care Type New Palliative care  Reason for referral Clarify Goals of Care  Date first seen by Palliative Care 10/23/20  Clinical Assessment   Palliative Performance Scale Score 50%  Psychosocial & Spiritual Assessment   Palliative Care Outcomes   Patient/Family meeting held? Yes  Who was at the meeting? patient  Palliative Care Outcomes Clarified goals of care, Provided psychosocial or spiritual support, ACP counseling assistance       Time Total: 60 Greater than 50%  of this time was spent counseling and coordinating care related to the above assessment and plan.  Signed by:  Ihor Dow, DNP, FNP-C Palliative Medicine Team  Phone: [phone] Fax: [phone]   Please contact Palliative Medicine Team phone at  [phone] for questions and concerns.  For individual provider: See Shea Evans

## 2020-10-23 NOTE — Consult Note (Signed)
NAME:  [name], MRN:  [id number], DOB:  1956/02/17, LOS: 2 ADMISSION DATE:  10/21/2020, CONSULTATION DATE:  3/22 REFERRING MD:  [facility]/ Dr Wynetta Emery , CHIEF COMPLAINT: UIP with SPN LUL   History of Present Illness:  65 yowf quit smoking 09/2020 with progressive sob x 2 y but still @ [facility] = can't walk a nl pace on a flat grade s sob but does fine slow and flat mall walking but then Covid mid 09/2020 with acute on chronic dyspnea p dx and  w/u pos for UIP by CT chest 10/13/10 and LUL SPN with w/u planned for PET 10/31/20 but in meantime recovering from   worsening hypoxemia as outpt so admitted 10/22/18 and pccm service consulted   3/22   With covid main symptoms were fever/ ha/ never vaccinated  Now with cough with slt purulent sputum, no cp or unusual exp hx or ongoing ha/ ST, dysphagia/ abd complaint.  Arthralgias = baseline "fibromyalgia" with no rheum dx or prior w/u and no h/o bird exp.  Pertinent  Medical History  65 y.o. female with medical history significant for COPD and pulmonary fibrosis from 71 years of smoking (stopped 2/22), newly discovered lung mass likely primary lung cancer, CAD s/p MI with stenting, GERD, Depression, Fibromyalgia, morbid obesity, HTN, prediabetes, OSA, hypothyroidism, hyperlipidemia, GAD, arthritis presented to ED with increasing shortness of breath symptoms.  Pt recovering from Covid infection diagnosed 67/01/74 complicated by pneumonia.   She reports that she has been working with her primary care physician and was started on supplemental oxygen recently due to having hypoxia at home with a pulse ox in the 70s.  Her oxygen has increased from an initial 2 L requirement up to 3.5 L at home.  She presented to the emergency department because she continued to have symptoms of shortness of breath even after she had escalated the oxygen to 3.5 L.  She denies fever and chills.  ED Course: Patient is afebrile With a heart rate of 24, blood pressure 108/72, pulse ox 100%  on 3.5 L.  Patient had a sodium of 133, potassium 4.3, glucose 136, calcium 8.5, albumin 2.4, creatinine 0.82.  WBC 21.0, hemoglobin 13.3, hematocrit 43.2, platelets 441.  Cardiac BNP 63.0, high-sensitivity troponin IX.  Chest x-ray reported:  Evidence of pulmonary fibrosis as suspected on the CTA earlier this month with interval increased reticulonodular opacity in the right upper lobe favored to represent acute infectious exacerbation. Otherwise stable; spiculated left upper lobe lung nodule not visible radiographically.  Patient is being admitted with acute on chronic respiratory failure.     Scheduled Meds: . ascorbic acid  500 mg Oral Daily  . atorvastatin  20 mg Oral QPM  . clopidogrel  75 mg Oral Daily  . enoxaparin (LOVENOX) injection  40 mg Subcutaneous Q24H  . FLUoxetine  60 mg Oral Daily  . insulin aspart  0-15 Units Subcutaneous TID WC  . insulin aspart  4 Units Subcutaneous TID WC  . ipratropium-albuterol  3 mL Nebulization QID  . isosorbide mononitrate  15 mg Oral Daily  . levothyroxine  125 mcg Oral Once per day on Mon Tue Wed Thu Fri Sat  . levothyroxine  62.5 mcg Oral Q Sun  . methylPREDNISolone (SOLU-MEDROL) injection  80 mg Intravenous Q6H  . metoprolol succinate  50 mg Oral Daily  . nicotine  21 mg Transdermal Daily  . pantoprazole  40 mg Oral Daily  . saccharomyces boulardii  250 mg Oral BID  . zinc  sulfate  220 mg Oral Daily   Continuous Infusions: . cefTRIAXone (ROCEPHIN)  IV 2 g (10/23/20 1254)   PRN Meds:.acetaminophen, ALPRAZolam, amitriptyline, bisacodyl, guaiFENesin-dextromethorphan, HYDROcodone-acetaminophen, ipratropium-albuterol, nitroGLYCERIN     Interim History / Subjective:  No real change sob/ cough since admit   Objective   Blood pressure 117/68, pulse 64, temperature 97.7 F (36.5 C), temperature source Oral, resp. rate 20, height _0  (1.676 m), weight 106.6 kg, SpO2 90 %.        Intake/Output Summary (Last 24 hours) at 10/23/2020  1750 Last data filed at 10/23/2020 1300 Gross per 24 hour  Intake 720 ml  Output --  Net 720 ml   Filed Weights   10/21/20 0524  Weight: 106.6 kg    Examination: General:  tmax 97.7 on Rocephin/zpax since 10/21/20 HENT: orophx clear Lungs: insp crackles both bases with sats 90 on 14lpm  Cardiovascular: no s3 Abdomen: obese/ soft/ no tenderness Extremities: warm s calf tenderness  Neuro: intact sensorium       I personally reviewed images and agree with radiology impression as follows:   Chest CTa 10/23/20  1. Negative examination for pulmonary embolism. 2. There is moderate to severe pulmonary fibrosis in a pattern with apical to basal gradient featuring irregular peripheral interstitial opacity, septal thickening, traction bronchiectasis, areas of subpleural bronchiolectasis and honeycombing. Findings are consistent with a UIP pattern of fibrosis by ATS pulmonary fibrosis criteria, and are markedly worsened in comparison to the included lung bases on CT of the abdomen pelvis dated 10/24/2015. 3. There are scattered areas of acutely superimposed ground-glass and heterogeneous airspace opacity, consistent with infection or acute inflammatory flare of fibrotic interstitial lung disease. 4. There is a spiculated nodule of the peripheral left upper lobe measuring 1.9 x 1.8 cm, highly concerning for primary lung malignancy in the high risk setting of pulmonary fibrosis. Recommend multi disciplinary referral on a nonemergent basis for consideration of PET metabolic characterization and tissue sampling. 5. There are enlarged bilateral hilar, mediastinal, and prevascular lymph nodes, nonspecific and possibly reactive in the setting of fibrosis. 6. Coronary artery disease.     Assessment & Plan:  1)  PF s/p covid 19 ? Underlying uip with superimposed alveolitis (GG changes) a portion of which certainly likely to represent covid 19 FP dz vs acute flare of UIP, both best rx =  steroids/ IS / supplemental 02 >>> rec check ESR baseline, continue high doses steroids for now   2) acute hypoxemic resp failure  >>> DNR noted, no increased wob just high 02 requirements at present.   3) SPN LUL may just be inflammatory but given smoking hx could also be early stage neoplasm  >>> moot point for now but will need f/u certainly as outpt if we can get her thru here acute hypoxemic RF      Labs   CBC: Recent Labs  Lab 10/21/20 0548 10/22/20 0351 10/23/20 0505  WBC 21.0* 22.8* 27.1*  NEUTROABS 14.9* 20.6* 24.6*  HGB 13.3 12.7 12.5  HCT 43.2 41.1 41.4  MCV 90.6 90.1 91.0  PLT 441* 429* 452*    Basic Metabolic Panel: Recent Labs  Lab 10/21/20 0548 10/22/20 0351 10/23/20 0505  NA 133* 134* 137  K 4.3 3.6 4.3  CL 99 103 104  CO2 24 21* 22  GLUCOSE 136* 198* 165*  BUN _1 CREATININE 0.82 0.80 0.77  CALCIUM 8.5* 8.7* 9.1  MG  --  2.2 2.3   GFR: Estimated Creatinine Clearance:  85.4 mL/min (by C-G formula based on SCr of 0.77 mg/dL). Recent Labs  Lab 10/21/20 0548 10/22/20 0351 10/23/20 0505  WBC 21.0* 22.8* 27.1*    Liver Function Tests: Recent Labs  Lab 10/21/20 0548 10/22/20 0351 10/23/20 0505  AST _0 ALT _1 ALKPHOS 104 101 108  BILITOT 0.7 0.5 0.4  PROT 7.0 6.9 7.0  ALBUMIN 2.4* 2.4* 2.4*   No results for input(s): LIPASE, AMYLASE in the last 168 hours. No results for input(s): AMMONIA in the last 168 hours.  ABG No results found for: PHART, PCO2ART, PO2ART, HCO3, TCO2, ACIDBASEDEF, O2SAT   Coagulation Profile: No results for input(s): INR, PROTIME in the last 168 hours.  Cardiac Enzymes: No results for input(s): CKTOTAL, CKMB, CKMBINDEX, TROPONINI in the last 168 hours.  HbA1C: Hgb A1c MFr Bld  Date/Time Value Ref Range Status  10/16/2020 09:44 AM 6.4 (H) 4.8 - 5.6 % Final    Comment:             Prediabetes: 5.7 - 6.4          Diabetes: >6.4          Glycemic control for adults with diabetes: <7.0    10/17/2019 11:50 AM 6.1 (H) 4.8 - 5.6 % Final    Comment:             Prediabetes: 5.7 - 6.4          Diabetes: >6.4          Glycemic control for adults with diabetes: <7.0     CBG: Recent Labs  Lab 10/22/20 2056 10/23/20 0306 10/23/20 0731 10/23/20 1132 10/23/20 1617  GLUCAP 207* 152* 150* 188* 198*       Past Medical History:  She,  has a past medical history of Anxiety, Arthritis, Asthma, Cancer ([place]), Coronary atherosclerosis of native coronary artery, Depression, Essential hypertension, Fibromyalgia, GERD (gastroesophageal reflux disease), History of blood transfusion (2005), History of kidney stones, Hyperlipidemia, Hypothyroidism, Myocardial infarction ([place]) (2017), NSTEMI (non-ST elevated myocardial infarction) ([place]) (2005), Palpitations, Pulmonary fibrosis ([place]), and Sleep apnea.   Surgical History:   Past Surgical History:  Procedure Laterality Date  . CARDIAC CATHETERIZATION  ~ 2009/2010  . CARDIAC CATHETERIZATION N/A 11/21/2015   Procedure: Left Heart Cath and Coronary Angiography;  Surgeon: Burnell Blanks, MD;  Location: [place] [facility];  Service: Cardiovascular;  Laterality: N/A;  . CARDIAC CATHETERIZATION  10/21/2017  . CARPAL TUNNEL RELEASE Bilateral 2007- 2008  . CORONARY ANGIOPLASTY WITH STENT PLACEMENT  2005  . FOOT BONE EXCISION     Sesmoid bone left foot-repair of fracture  . LEFT HEART CATH AND CORONARY ANGIOGRAPHY N/A 10/21/2017   Procedure: LEFT HEART CATH AND CORONARY ANGIOGRAPHY;  Surgeon: Wellington Hampshire, MD;  Location: [place] [facility];  Service: Cardiovascular;  Laterality: N/A;  . TUBAL LIGATION       Social History:   reports that she quit smoking about 4 weeks ago. Her smoking use included cigarettes. She started smoking about 51 years ago. She has a 16.17 pack-year smoking history. She has never used smokeless tobacco. She reports that she does not drink alcohol and does not use drugs.   Family History:  Her family history  includes Coronary artery disease in her father; Heart failure in her father and mother. There is no history of Anesthesia problems, Hypotension, Malignant hyperthermia, or Pseudochol deficiency.   Allergies Allergies  Allergen Reactions  . Cymbalta [Duloxetine Hcl] Other (See  Comments)    Felt bad  . Lyrica [Pregabalin] Other (See Comments)    Retained fluids     Home Medications  Prior to Admission medications   Medication Sig Start Date End Date Taking? Authorizing Provider  acetaminophen (TYLENOL) 325 MG tablet Take 650 mg by mouth every 6 (six) hours as needed for mild pain or headache.   Yes [provider]  albuterol (VENTOLIN HFA) 108 (90 Base) MCG/ACT inhaler Inhale 2 puffs into the lungs every 6 (six) hours as needed for wheezing or shortness of breath. 06/26/20  Yes Chalmers Guest, NP  ALPRAZolam Duanne Moron) 0.5 MG tablet Take one tablet daily as needed for anxiety. Patient taking differently: Take 0.5 mg by mouth daily as needed for anxiety. 06/27/20  Yes Mozingo, Berdie Ogren, NP  amitriptyline (ELAVIL) 25 MG tablet Take 1 tablet by mouth at bedtime as needed for sleep. 03/17/19  Yes [provider]  ascorbic acid (VITAMIN C) 500 MG tablet Take 1 tablet (500 mg total) by mouth daily. 10/14/20 11/13/20 Yes Shah, Pratik D, DO  atorvastatin (LIPITOR) 20 MG tablet Take 20 mg by mouth daily.   Yes [provider]  azithromycin (ZITHROMAX Z-PAK) 250 MG tablet Take 2 tablets (500 mg) on  Day 1,  followed by 1 tablet (250 mg) once daily on Days 2 through 5. 10/18/20  Yes Luking, Elayne Snare, MD  Biotin 10000 MCG TABS Take 1 tablet by mouth daily.   Yes [provider]  clopidogrel (PLAVIX) 75 MG tablet TAKE 1 TABLET EVERY DAY Patient taking differently: Take 75 mg by mouth daily. 06/18/20  Yes Luking, Elayne Snare, MD  Cyanocobalamin (VITAMIN B-12 PO) Take 1 tablet by mouth daily.   Yes [provider]  FLUoxetine (PROZAC) 20 MG capsule Take 3 capsule po  daily Patient taking differently: Take 60 mg by mouth daily. Take 3 capsule po daily 07/17/20  Yes Dolby, Craige Cotta, NP  guaiFENesin-dextromethorphan (ROBITUSSIN DM) 100-10 MG/5ML syrup Take 10 mLs by mouth every 4 (four) hours as needed for cough. 10/13/20  Yes Shah, Pratik D, DO  HYDROcodone-acetaminophen (NORCO/VICODIN) 5-325 MG tablet Take 1 tablet by mouth every 6 (six) hours as needed for moderate pain.   Yes [provider]  hydrOXYzine (ATARAX/VISTARIL) 25 MG tablet Take 25-50 mg by mouth daily as needed for itching. 06/26/20  Yes [provider]  isosorbide mononitrate (IMDUR) 30 MG 24 hr tablet TAKE 1/2 TABLET ONE TIME DAILY. Needs visit by early summer Patient taking differently: Take 15 mg by mouth daily. 10/11/20  Yes Kathyrn Drown, MD  levothyroxine (SYNTHROID) 125 MCG tablet TAKE 1/2 TABLET ON SUNDAYS AND ONE TABLET ALL OTHER DAYS. Needs visit by early summer. Patient taking differently: Take 62.5-125 mcg by mouth See admin instructions. TAKE 1/2 TABLET ON SUNDAYS AND ONE TABLET ALL OTHER DAYS. Needs visit by early summer. 10/11/20  Yes Luking, Scott A, MD  lidocaine (LIDODERM) 5 % Place 1 patch onto the skin daily as needed (pain). 03/07/19  Yes [provider]  metoprolol succinate (TOPROL-XL) 50 MG 24 hr tablet TAKE 1 TABLET (50 MG TOTAL) BY MOUTH DAILY. TAKE WITH OR IMMEDIATELY FOLLOWING A MEAL. 02/07/20 05/07/20 Yes Satira Sark, MD  nitroGLYCERIN (NITROSTAT) 0.4 MG SL tablet DISSOLVE 1 TABLET UNDER TONGUE EVERY 5 MINUTES FOR UP TO 3 DOSES AS NEEDED FOR CHEST PAIN. Patient taking differently: Place 0.4 mg under the tongue every 5 (five) minutes as needed for chest pain. 05/16/19  Yes Sallee Lange  A, MD  pantoprazole (PROTONIX) 40 MG tablet TAKE 1 TABLET EVERY DAY. Needs visit by early summer Patient taking differently: Take 40 mg by mouth daily. 10/11/20  Yes Luking, Elayne Snare, MD  tiZANidine (ZANAFLEX) 4 MG tablet Take 1-2 tablets by mouth at bedtime as  needed for muscle spasms. 03/17/19  Yes [provider]  zinc sulfate 220 (50 Zn) MG capsule Take 1 capsule (220 mg total) by mouth daily. 10/14/20 11/13/20 Yes Manuella Ghazi, Pratik D, DO      Christinia Gully, MD Pulmonary and [place] [phone]   After 7:00 pm call Elink  [phone]

## 2020-10-23 NOTE — Plan of Care (Signed)
  Problem: Acute Rehab PT Goals(only PT should resolve) Goal: Pt Will Go Supine/Side To Sit Outcome: Progressing Flowsheets (Taken 10/23/2020 1143) Pt will go Supine/Side to Sit: with modified independence Goal: Patient Will Transfer Sit To/From Stand Outcome: Progressing Flowsheets (Taken 10/23/2020 1143) Patient will transfer sit to/from stand:  with modified independence  with supervision Goal: Pt Will Transfer Bed To Chair/Chair To Bed Outcome: Progressing Flowsheets (Taken 10/23/2020 1143) Pt will Transfer Bed to Chair/Chair to Bed:  with modified independence  with supervision Goal: Pt Will Ambulate Outcome: Progressing Flowsheets (Taken 10/23/2020 1143) Pt will Ambulate:  50 feet  with supervision  with rolling walker  with least restrictive assistive device   11:46 AM, 10/23/20 Lonell Grandchild, MPT Physical Therapist with [facility] [phone] office [phone] mobile phone

## 2020-10-23 NOTE — Progress Notes (Signed)
PROGRESS NOTE   [name]  QQP:[id number] DOB: 1955/11/21 DOA: 10/21/2020 PCP: Kathyrn Drown, MD   Chief Complaint  Patient presents with  . Shortness of Breath   Level of care: Telemetry  Brief Admission History:  65 y.o. female with medical history significant for COPD and pulmonary fibrosis from 108 years of smoking (stopped 2/22), newly discovered lung mass likely primary lung cancer, CAD s/p MI with stenting, GERD, Depression, Fibromyalgia, morbid obesity, HTN, prediabetes, OSA, hypothyroidism, hyperlipidemia, GAD, arthritis presented to ED with increasing shortness of breath symptoms.  Pt recovering from Covid infection diagnosed 02/13/44 complicated by pneumonia.   She reports that she has been working with her primary care physician and was started on supplemental oxygen recently due to having hypoxia at home with a pulse ox in the 70s.  Her oxygen has increased from an initial 2 L requirement up to 3.5 L at home.  She presented to the emergency department because she continued to have symptoms of shortness of breath even after she had escalated the oxygen to 3.5 L.  She denies fever and chills.  Assessment & Plan:   Principal Problem:   Acute and chronic respiratory failure with hypoxia (HCC) Active Problems:   Pulmonary fibrosis (HCC)   Hypothyroidism   Mixed hyperlipidemia   Essential hypertension   CORONARY ATHEROSCLEROSIS NATIVE CORONARY ARTERY   Fibromyalgia   GERD (gastroesophageal reflux disease)   Fatty liver disease, nonalcoholic   Morbid obesity (HCC)   Cervical myelopathy (HCC)   B12 deficiency   Unstable angina (HCC)   Prediabetes   Dyspnea on exertion   Secondary cardiomyopathy (HCC)   Mass of upper lobe of left lung   Sleep apnea   Leukocytosis    Acute on chronic respiratory failure -Patient is having progressive oxygen requirements since she was recently discharged on 10/12/2020.  -Chest x-ray with new findings of infection.  -Continue HCAP  treatment and supportive measures. -Repeated chest x-ray with no improvement seen.  Pulmonary fibrosis and new lung mass -Patient has been referred to pulmonology clinic by PCP. -Continue IV steroids and neb treatments as ordered. -Continue respiratory support as ordered. -Pt has established with Dr. Delton Coombes for oncology care -Inpatient pulmonary consult requested  OSA -Offer nightly CPAP while in hospital.  Coronary artery disease with history of unstable angina -Status post stent placement -Resume all home cardiac medications -HS troponin reassuring, ACS ruled out  Hypothyroidism  - resumed home thyroid supplement  Leukocytosis - exacerbated by high dose IV steroids - From recent outpatient steroids - Follow CBC with diff  GERD - protonix ordered for GI protection   Prediabetes - As evidenced by an A1c of 6.4% - Anticipating steroid induced hyperglycemia  - SSI coverage ordered with CBG testing  CBG (last 3)  Recent Labs    10/22/20 2056 10/23/20 0306 10/23/20 0731  GLUCAP 207* 152* 150*    Lung mass - Pt has seen Dr. Delton Coombes and planning for PET scan and bronchoscopy - Pulmonary consulted   GAD - resume home alprazolam as needed for symptoms   DNR present on admission  - continue DNR order while inpatient  - high 12 month mortality risk, will ask for palliative care consultation    DVT prophylaxis: enoxaparin   Code Status: DNR   Family Communication:   Disposition Plan: home   Consults called:  pulmonary Admission status: INP   Remains inpatient appropriate because:IV treatments appropriate due to intensity of illness or inability to take PO and  Inpatient level of care appropriate due to severity of illness  Dispo: The patient is from: Home              Anticipated d/c is to: Home              Patient currently is not medically stable to d/c.   Difficult to place patient No  Consultants:   pulmonary  Procedures:   N/a    Antimicrobials:  Ceftriaxone 3/20> Azithromycin 3/20>   Subjective: Pt says she is sleeping better on CPAP.   Objective: Vitals:   10/22/20 2240 10/23/20 0433 10/23/20 0508 10/23/20 0848  BP:   111/72   Pulse: (!) 58  66   Resp: 20  20   Temp:   (!) 97.2 F (36.2 C)   TempSrc:      SpO2: 93% (!) 88% 97% (!) 89%  Weight:      Height:        Intake/Output Summary (Last 24 hours) at 10/23/2020 1133 Last data filed at 10/23/2020 1100 Gross per 24 hour  Intake 480 ml  Output 650 ml  Net -170 ml   Filed Weights   10/21/20 0524  Weight: 106.6 kg    Examination:  General exam: awake, alert, cooperative, Appears calm and comfortable  Respiratory system: Moderate increased work of breathing, better air movement. Cardiovascular system: normal S1 & S2 heard. No JVD, murmurs, rubs, gallops or clicks. No pedal edema. Gastrointestinal system: Abdomen is nondistended, soft and nontender. No organomegaly or masses felt. Normal bowel sounds heard. Central nervous system: Alert and oriented. No focal neurological deficits. Extremities: Symmetric 5 x 5 power. Skin: No rashes, lesions or ulcers Psychiatry: Judgement and insight appear normal. Mood & affect appropriate.   Data Reviewed: I have personally reviewed following labs and imaging studies  CBC: Recent Labs  Lab 10/21/20 0548 10/22/20 0351 10/23/20 0505  WBC 21.0* 22.8* 27.1*  NEUTROABS 14.9* 20.6* 24.6*  HGB 13.3 12.7 12.5  HCT 43.2 41.1 41.4  MCV 90.6 90.1 91.0  PLT 441* 429* 452*    Basic Metabolic Panel: Recent Labs  Lab 10/21/20 0548 10/22/20 0351 10/23/20 0505  NA 133* 134* 137  K 4.3 3.6 4.3  CL 99 103 104  CO2 24 21* 22  GLUCOSE 136* 198* 165*  BUN 17 19 22   CREATININE 0.82 0.80 0.77  CALCIUM 8.5* 8.7* 9.1  MG  --  2.2 2.3    GFR: Estimated Creatinine Clearance: 85.4 mL/min (by C-G formula based on SCr of 0.77 mg/dL).  Liver Function Tests: Recent Labs  Lab 10/21/20 0548 10/22/20 0351  10/23/20 0505  AST 24 17 22   ALT 29 25 27   ALKPHOS 104 101 108  BILITOT 0.7 0.5 0.4  PROT 7.0 6.9 7.0  ALBUMIN 2.4* 2.4* 2.4*    CBG: Recent Labs  Lab 10/22/20 1145 10/22/20 1635 10/22/20 2056 10/23/20 0306 10/23/20 0731  GLUCAP 156* 252* 207* 152* 150*    No results found for this or any previous visit (from the past 240 hour(s)).   Radiology Studies: DG Chest Port 1 View  Result Date: 10/22/2020 CLINICAL DATA:  65 year old female with shortness of breath status post COVID-19 1 month ago. Pulmonary fibrosis suspected on CTA. Respiratory failure. EXAM: PORTABLE CHEST 1 VIEW COMPARISON:  Portable chest 10/21/2020 and earlier. FINDINGS: Stable lung volumes and mediastinal contours. Asymmetric coarse pulmonary interstitial opacity is stable from yesterday, and remains mildly increased in the right upper lobe compared to 10/12/2020. Spiculated left upper lung  nodule remains occult by x-ray. No pneumothorax, pleural effusion or areas of worsening ventilation. Visualized tracheal air column is within normal limits. Stable visualized osseous structures. IMPRESSION: 1. Suspected pulmonary fibrosis with continued increased reticulonodular opacity in the right upper lobe compared to earlier this month, possibly acute infectious exacerbation. 2. Stable since yesterday. Electronically Signed   By: Genevie Ann M.D.   On: 10/22/2020 05:42    Scheduled Meds: . ascorbic acid  500 mg Oral Daily  . atorvastatin  20 mg Oral QPM  . clopidogrel  75 mg Oral Daily  . enoxaparin (LOVENOX) injection  40 mg Subcutaneous Q24H  . FLUoxetine  60 mg Oral Daily  . insulin aspart  0-15 Units Subcutaneous TID WC  . insulin aspart  4 Units Subcutaneous TID WC  . ipratropium-albuterol  3 mL Nebulization QID  . isosorbide mononitrate  15 mg Oral Daily  . levothyroxine  125 mcg Oral Once per day on Mon Tue Wed Thu Fri Sat  . levothyroxine  62.5 mcg Oral Q Sun  . methylPREDNISolone (SOLU-MEDROL) injection  80 mg  Intravenous Q6H  . metoprolol succinate  50 mg Oral Daily  . nicotine  21 mg Transdermal Daily  . pantoprazole  40 mg Oral Daily  . saccharomyces boulardii  250 mg Oral BID  . zinc sulfate  220 mg Oral Daily   Continuous Infusions: . cefTRIAXone (ROCEPHIN)  IV 2 g (10/22/20 0951)     LOS: 2 days   Time spent: 35 mins  Javontay Vandam Wynetta Emery, MD How to contact the [facility] Attending or Consulting provider [place] or covering provider during after hours [place], for this patient?  1. Check the care team in [facility] and look for a) attending/consulting [facility] provider listed and b) the [facility] team listed 2. Log into [email or web address] and use [place]'s universal password to access. If you do not have the password, please contact the hospital operator. 3. Locate the [facility] provider you are looking for under [facility] and page to a number that you can be directly reached. 4. If you still have difficulty reaching the provider, please page the [facility] (Director on Call) for the Hospitalists listed on amion for assistance.  10/23/2020, 11:33 AM

## 2020-10-24 LAB — CBC WITH DIFFERENTIAL/PLATELET
Abs Immature Granulocytes: 0.58 10*3/uL — ABNORMAL HIGH (ref 0.00–0.07)
Basophils Absolute: 0.1 10*3/uL (ref 0.0–0.1)
Basophils Relative: 0 %
Eosinophils Absolute: 0 10*3/uL (ref 0.0–0.5)
Eosinophils Relative: 0 %
HCT: 42 % (ref 36.0–46.0)
Hemoglobin: 12.7 g/dL (ref 12.0–15.0)
Immature Granulocytes: 2 %
Lymphocytes Relative: 4 %
Lymphs Abs: 1 10*3/uL (ref 0.7–4.0)
MCH: 27.5 pg (ref 26.0–34.0)
MCHC: 30.2 g/dL (ref 30.0–36.0)
MCV: 91.1 fL (ref 80.0–100.0)
Monocytes Absolute: 1.7 10*3/uL — ABNORMAL HIGH (ref 0.1–1.0)
Monocytes Relative: 7 %
Neutro Abs: 22.1 10*3/uL — ABNORMAL HIGH (ref 1.7–7.7)
Neutrophils Relative %: 87 %
Platelets: 481 10*3/uL — ABNORMAL HIGH (ref 150–400)
RBC: 4.61 MIL/uL (ref 3.87–5.11)
RDW: 16.2 % — ABNORMAL HIGH (ref 11.5–15.5)
WBC: 25.4 10*3/uL — ABNORMAL HIGH (ref 4.0–10.5)
nRBC: 0 % (ref 0.0–0.2)

## 2020-10-24 LAB — COMPREHENSIVE METABOLIC PANEL
ALT: 47 U/L — ABNORMAL HIGH (ref 0–44)
AST: 35 U/L (ref 15–41)
Albumin: 2.3 g/dL — ABNORMAL LOW (ref 3.5–5.0)
Alkaline Phosphatase: 121 U/L (ref 38–126)
Anion gap: 10 (ref 5–15)
BUN: 29 mg/dL — ABNORMAL HIGH (ref 8–23)
CO2: 23 mmol/L (ref 22–32)
Calcium: 9.1 mg/dL (ref 8.9–10.3)
Chloride: 105 mmol/L (ref 98–111)
Creatinine, Ser: 0.68 mg/dL (ref 0.44–1.00)
GFR, Estimated: >60 mL/min (ref >60)
Glucose, Bld: 182 mg/dL — ABNORMAL HIGH (ref 70–99)
Potassium: 4.8 mmol/L (ref 3.5–5.1)
Sodium: 138 mmol/L (ref 135–145)
Total Bilirubin: 0.4 mg/dL (ref 0.3–1.2)
Total Protein: 6.6 g/dL (ref 6.5–8.1)

## 2020-10-24 LAB — GLUCOSE, CAPILLARY
Glucose-Capillary: 179 mg/dL — ABNORMAL HIGH (ref 70–99)
Glucose-Capillary: 163 mg/dL — ABNORMAL HIGH (ref 70–99)
Glucose-Capillary: 227 mg/dL — ABNORMAL HIGH (ref 70–99)
Glucose-Capillary: 192 mg/dL — ABNORMAL HIGH (ref 70–99)
Glucose-Capillary: 223 mg/dL — ABNORMAL HIGH (ref 70–99)

## 2020-10-24 LAB — MAGNESIUM: Magnesium: 2.3 mg/dL (ref 1.7–2.4)

## 2020-10-24 NOTE — Progress Notes (Signed)
Pt took CPAP machine off at 0253 and back on o2 at 14lpm  [place] high flow cannula

## 2020-10-24 NOTE — Progress Notes (Signed)
Pt spo2 86% on 14lpm [place] high flow [place].. increased to 15lpm and added nrb over [place].. pt spo2 93%. Pt refusing to wear CPAP tonight just wants to wear o2. RT will continue to monitor

## 2020-10-24 NOTE — Progress Notes (Signed)
PROGRESS NOTE   [name]  LFY:[id number] DOB: 12-Jan-1956 DOA: 10/21/2020 PCP: Kathyrn Drown, MD   Chief Complaint  Patient presents with  . Shortness of Breath   Level of care: Telemetry  Brief Admission History:  65 y.o. female with medical history significant for COPD and pulmonary fibrosis from 84 years of smoking (stopped 2/22), newly discovered lung mass likely primary lung cancer, CAD s/p MI with stenting, GERD, Depression, Fibromyalgia, morbid obesity, HTN, prediabetes, OSA, hypothyroidism, hyperlipidemia, GAD, arthritis presented to ED with increasing shortness of breath symptoms.  Pt recovering from Covid infection diagnosed 8/52/77 complicated by pneumonia.   She reports that she has been working with her primary care physician and was started on supplemental oxygen recently due to having hypoxia at home with a pulse ox in the 70s.  Her oxygen has increased from an initial 2 L requirement up to 3.5 L at home.  She presented to the emergency department because she continued to have symptoms of shortness of breath even after she had escalated the oxygen to 3.5 L.  She denies fever and chills.  Assessment & Plan:   Principal Problem:   Acute and chronic respiratory failure with hypoxia (HCC) Active Problems:   Hypothyroidism   Mixed hyperlipidemia   Essential hypertension   CORONARY ATHEROSCLEROSIS NATIVE CORONARY ARTERY   Fibromyalgia   GERD (gastroesophageal reflux disease)   Fatty liver disease, nonalcoholic   Morbid obesity (HCC)   Cervical myelopathy (HCC)   B12 deficiency   Goals of care, counseling/discussion   Unstable angina (HCC)   Prediabetes   Dyspnea on exertion   Secondary cardiomyopathy (HCC)   Mass of upper lobe of left lung   Sleep apnea   Pulmonary fibrosis (HCC)   Leukocytosis   HCAP (healthcare-associated pneumonia)   Palliative care by specialist   A/p Acute on chronic Hypoxic Respiratory failure -Patient is having progressive oxygen  requirements since she was recently discharged on 10/12/2020.  -Please note that prior to diagnosis of COVID-19 infection on 09/21/2020 patient did not require oxygen -Chest x-ray with new findings of infection.  -Continue HCAP treatment and supportive measures. -Repeat chest x-ray with no improvement seen. -Currently on 15 L of oxygen via nasal cannula -Continue Rocephin  for possible superimposed bacterial pneumonia -Continue IV Solu-Medrol, bronchodilators -Anticipate that leukocytosis would persist with steroid use  2)Pulmonary Fibrosis --- appears to be post-COVID pulmonary fibrosis -Leading to worsening  hypoxic respiratory failure -Management as above #1 -Pulmonology consult appreciated  3)Lt UL Mass-reticulonodular opacity in the right upper lobe  -CT PE protocol on 10/12/2020 showed spiculated nodule in the peripheral left upper lobe measuring 1.9 x 1.8 cm with enlarged bilateral hilar, mediastinal and prevascular lymph nodes, largest pretracheal nodes measuring 2.5 x 2.5 cm -Patient was seen in consultation on 10/17/2020 by oncologist Dr. Delton Coombes who recommended--PET CT scan and brain MRI with and without contrast, Per Dr. Delton Coombes patient may need navigational bronchoscopy and biopsy  4)Obesity/OSA -Offer nightly CPAP while in hospital.  5)Coronary artery disease- s/p DES to LAD and D1 in 2005 b. cath in 11/2015 showing patent stents with mild disease along RCA and LCx -ACS ruled out, currently chest pain-free -Continue Lipitor and Plavix, as well as metoprolol and isosorbide  6)Hypothyroidism  -TSH is 1.1 reflecting euthyroid state --Patient is on about 187.5 mcg of levothyroxine, continue same  7)Tobacco Abuse- -Patient quit smoking in February 2022 after being diagnosed with COVID-19 infection -Continue nicotine patch  8)GERD -Continue protonix especially while on  high-dose steroids-  9)DM2-A1c 6.4 reflecting excellent diabetic control PTA -Use  Novolog/Humalog Sliding scale insulin with Accu-Cheks/Fingersticks as ordered  -Anticipate worsening glycemic control with steroids  10)Social/Ethics--DNR, palliative care consult appreciated  11)Depression and Anxiety--- a bit more anxious lately, continue Prozac 60 mg daily, Xanax as needed  DVT prophylaxis: enoxaparin   Code Status: DNR   Family Communication:   Disposition Plan: home   Consults called:  pulmonary Admission status: INP   Remains inpatient appropriate because:Inpatient level of care appropriate due to severity of illness and Severe hypoxia requiring up to 15 L of oxygen via high flow nasal cannula, significant respiratory failure/symptoms with significant dyspnea and increased work of breathing   Dispo: The patient is from: Home              Anticipated d/c is to: Home              Patient currently is not medically stable to d/c.   Difficult to place patient No  Consultants:   pulmonary  Procedures:   N/a   Antimicrobials:  Ceftriaxone 3/20>   Subjective: Cough, shortness of breath, dyspnea persist -Patient has increased work of breathing with positional change in bed No fever  Or chills .   Objective: Vitals:   10/24/20 0432 10/24/20 0711 10/24/20 1134 10/24/20 1419  BP: 137/77   (!) 141/80  Pulse: 62   (!) 59  Resp: 20   20  Temp: (!) 97.5 F (36.4 C)   97.9 F (36.6 C)  TempSrc: Oral   Oral  SpO2: 93% 93% 95% 96%  Weight:      Height:        Intake/Output Summary (Last 24 hours) at 10/24/2020 1529 Last data filed at 10/24/2020 1300 Gross per 24 hour  Intake 1230 ml  Output 1200 ml  Net 30 ml   Filed Weights   10/21/20 0524  Weight: 106.6 kg    Examination:   Physical Exam  Gen:- Awake Alert, obese, dyspnea on exertion with positional change in bed HEENT:- [place].AT, No sclera icterus Nose- HFNC 15L.min Neck-Supple Neck,No JVD,.  Lungs-diminished breath sounds scattered rhonchi bilaterally  CV- S1, S2 normal Abd-  +ve  B.Sounds, Abd Soft, No tenderness, increased truncal adiposity    Extremity/Skin:- No  edema, good pedal pulses Psych-affect is appropriate, oriented x3 Neuro-generalized weakness, no new focal deficits, no tremors .   Data Reviewed:   CBC: Recent Labs  Lab 10/21/20 0548 10/22/20 0351 10/23/20 0505 10/24/20 0455  WBC 21.0* 22.8* 27.1* 25.4*  NEUTROABS 14.9* 20.6* 24.6* 22.1*  HGB 13.3 12.7 12.5 12.7  HCT 43.2 41.1 41.4 42.0  MCV 90.6 90.1 91.0 91.1  PLT 441* 429* 452* 481*    Basic Metabolic Panel: Recent Labs  Lab 10/21/20 0548 10/22/20 0351 10/23/20 0505 10/24/20 0455  NA 133* 134* 137 138  K 4.3 3.6 4.3 4.8  CL 99 103 104 105  CO2 24 21* 22 23  GLUCOSE 136* 198* 165* 182*  BUN 17 19 22  29*  CREATININE 0.82 0.80 0.77 0.68  CALCIUM 8.5* 8.7* 9.1 9.1  MG  --  2.2 2.3 2.3    GFR: Estimated Creatinine Clearance: 85.4 mL/min (by C-G formula based on SCr of 0.68 mg/dL).  Liver Function Tests: Recent Labs  Lab 10/21/20 0548 10/22/20 0351 10/23/20 0505 10/24/20 0455  AST 24 17 22  35  ALT 29 25 27  47*  ALKPHOS 104 101 108 121  BILITOT 0.7 0.5 0.4 0.4  PROT 7.0  6.9 7.0 6.6  ALBUMIN 2.4* 2.4* 2.4* 2.3*    CBG: Recent Labs  Lab 10/23/20 1617 10/23/20 2114 10/24/20 0246 10/24/20 0730 10/24/20 1104  GLUCAP 198* 206* 179* 163* 227*    No results found for this or any previous visit (from the past 240 hour(s)).   Radiology Studies: No results found.  Scheduled Meds: . ascorbic acid  500 mg Oral Daily  . atorvastatin  20 mg Oral QPM  . clopidogrel  75 mg Oral Daily  . enoxaparin (LOVENOX) injection  40 mg Subcutaneous Q24H  . FLUoxetine  60 mg Oral Daily  . insulin aspart  0-15 Units Subcutaneous TID WC  . insulin aspart  4 Units Subcutaneous TID WC  . ipratropium-albuterol  3 mL Nebulization QID  . isosorbide mononitrate  15 mg Oral Daily  . levothyroxine  125 mcg Oral Once per day on Mon Tue Wed Thu Fri Sat  . levothyroxine  62.5 mcg Oral Q Sun   . methylPREDNISolone (SOLU-MEDROL) injection  80 mg Intravenous Q6H  . metoprolol succinate  50 mg Oral Daily  . nicotine  21 mg Transdermal Daily  . pantoprazole  40 mg Oral Daily  . saccharomyces boulardii  250 mg Oral BID  . zinc sulfate  220 mg Oral Daily   Continuous Infusions: . cefTRIAXone (ROCEPHIN)  IV 2 g (10/24/20 0910)     LOS: 3 days   Roxan Hockey, MD How to contact the [facility] Attending or Consulting provider [place] or covering provider during after hours [place], for this patient?  1. Check the care team in [facility] and look for a) attending/consulting TR[facility] provider listed and b) the [facility] team listed 2. Log into [email or web address] and use [place]'s universal password to access. If you do not have the password, please contact the hospital operator. 3. Locate the [facility] provider you are looking for under [facility] and page to a number that you can be directly reached. 4. If you still have difficulty reaching the provider, please page the [facility] (Director on Call) for the Hospitalists listed on amion for assistance.  10/24/2020, 3:29 PM

## 2020-10-25 ENCOUNTER — Ambulatory Visit: Payer: Medicare HMO | Admitting: Cardiology

## 2020-10-25 DIAGNOSIS — M797 Fibromyalgia: Secondary | ICD-10-CM

## 2020-10-25 DIAGNOSIS — F411 Generalized anxiety disorder: Secondary | ICD-10-CM

## 2020-10-25 DIAGNOSIS — J9621 Acute and chronic respiratory failure with hypoxia: Secondary | ICD-10-CM | POA: Diagnosis not present

## 2020-10-25 LAB — COMPREHENSIVE METABOLIC PANEL
ALT: 62 U/L — ABNORMAL HIGH (ref 0–44)
AST: 40 U/L (ref 15–41)
Albumin: 2.3 g/dL — ABNORMAL LOW (ref 3.5–5.0)
Alkaline Phosphatase: 126 U/L (ref 38–126)
Anion gap: 11 (ref 5–15)
BUN: 32 mg/dL — ABNORMAL HIGH (ref 8–23)
CO2: 23 mmol/L (ref 22–32)
Calcium: 9 mg/dL (ref 8.9–10.3)
Chloride: 104 mmol/L (ref 98–111)
Creatinine, Ser: 0.72 mg/dL (ref 0.44–1.00)
GFR, Estimated: >60 mL/min (ref >60)
Glucose, Bld: 178 mg/dL — ABNORMAL HIGH (ref 70–99)
Potassium: 4.3 mmol/L (ref 3.5–5.1)
Sodium: 138 mmol/L (ref 135–145)
Total Bilirubin: 0.4 mg/dL (ref 0.3–1.2)
Total Protein: 6.4 g/dL — ABNORMAL LOW (ref 6.5–8.1)

## 2020-10-25 LAB — CBC WITH DIFFERENTIAL/PLATELET
Abs Immature Granulocytes: 0.5 10*3/uL — ABNORMAL HIGH (ref 0.00–0.07)
Basophils Absolute: 0.1 10*3/uL (ref 0.0–0.1)
Basophils Relative: 0 %
Eosinophils Absolute: 0 10*3/uL (ref 0.0–0.5)
Eosinophils Relative: 0 %
HCT: 41.8 % (ref 36.0–46.0)
Hemoglobin: 12.8 g/dL (ref 12.0–15.0)
Immature Granulocytes: 2 %
Lymphocytes Relative: 5 %
Lymphs Abs: 1 10*3/uL (ref 0.7–4.0)
MCH: 27.5 pg (ref 26.0–34.0)
MCHC: 30.6 g/dL (ref 30.0–36.0)
MCV: 89.9 fL (ref 80.0–100.0)
Monocytes Absolute: 1.2 10*3/uL — ABNORMAL HIGH (ref 0.1–1.0)
Monocytes Relative: 6 %
Neutro Abs: 18.8 10*3/uL — ABNORMAL HIGH (ref 1.7–7.7)
Neutrophils Relative %: 87 %
Platelets: 460 10*3/uL — ABNORMAL HIGH (ref 150–400)
RBC: 4.65 MIL/uL (ref 3.87–5.11)
RDW: 16.4 % — ABNORMAL HIGH (ref 11.5–15.5)
WBC: 21.5 10*3/uL — ABNORMAL HIGH (ref 4.0–10.5)
nRBC: 0 % (ref 0.0–0.2)

## 2020-10-25 LAB — MAGNESIUM: Magnesium: 2.4 mg/dL (ref 1.7–2.4)

## 2020-10-25 LAB — GLUCOSE, CAPILLARY
Glucose-Capillary: 191 mg/dL — ABNORMAL HIGH (ref 70–99)
Glucose-Capillary: 208 mg/dL — ABNORMAL HIGH (ref 70–99)
Glucose-Capillary: 188 mg/dL — ABNORMAL HIGH (ref 70–99)
Glucose-Capillary: 202 mg/dL — ABNORMAL HIGH (ref 70–99)
Glucose-Capillary: 165 mg/dL — ABNORMAL HIGH (ref 70–99)

## 2020-10-25 MED ORDER — MORPHINE SULFATE (CONCENTRATE) 10 MG/0.5ML PO SOLN
5.0000 mg | ORAL | Status: DC | PRN
  Administered 2020-10-25 – 2020-10-26 (×2): 5 mg via SUBLINGUAL
  Filled 2020-10-25 (×2): qty 0.5

## 2020-10-25 MED ORDER — POLYETHYLENE GLYCOL 3350 17 G PO PACK
17.0000 g | PACK | Freq: Every day | ORAL | Status: DC
  Administered 2020-10-25: 17 g via ORAL
  Filled 2020-10-25 (×2): qty 1

## 2020-10-25 NOTE — Consult Note (Deleted)
NAME:  [name], MRN:  [id number], DOB:  1955/10/29, LOS: 4 ADMISSION DATE:  10/21/2020, CONSULTATION DATE:  3/22 REFERRING MD:  [facility]/ Dr Wynetta Emery , CHIEF COMPLAINT: UIP with SPN LUL   History of Present Illness:  65 yowf quit smoking 09/2020 with progressive sob x 2 y but still @ [facility] = can't walk a nl pace on a flat grade s sob but does fine slow and flat mall walking but then Covid mid 09/2020 with acute on chronic dyspnea p dx and  w/u pos for UIP by CT chest 10/13/10 and LUL SPN with w/u planned for PET 10/31/20 but in meantime recovering from   worsening hypoxemia as outpt so admitted 10/22/18 and pccm service consulted   3/22   With covid main symptoms were fever/ ha/ never vaccinated  Now with cough with slt purulent sputum, no cp or unusual exp hx or ongoing ha/ ST, dysphagia/ abd complaint.  Arthralgias = baseline "fibromyalgia" with no rheum dx or prior w/u and no h/o bird exp.  Pertinent  Medical History  65 y.o. female with medical history significant for COPD and pulmonary fibrosis from 33 years of smoking (stopped 2/22), newly discovered lung mass likely primary lung cancer, CAD s/p MI with stenting, GERD, Depression, Fibromyalgia, morbid obesity, HTN, prediabetes, OSA, hypothyroidism, hyperlipidemia, GAD, arthritis presented to ED with increasing shortness of breath symptoms.  Pt recovering from Covid infection diagnosed 6/67/72 complicated by pneumonia.   She reports that she has been working with her primary care physician and was started on supplemental oxygen recently due to having hypoxia at home with a pulse ox in the 70s.  Her oxygen has increased from an initial 2 L requirement up to 3.5 L at home.  She presented to the emergency department because she continued to have symptoms of shortness of breath even after she had escalated the oxygen to 3.5 L.  She denies fever and chills.  ED Course: Patient is afebrile With a heart rate of 24, blood pressure 108/72, pulse ox 100%  on 3.5 L.  Patient had a sodium of 133, potassium 4.3, glucose 136, calcium 8.5, albumin 2.4, creatinine 0.82.  WBC 21.0, hemoglobin 13.3, hematocrit 43.2, platelets 441.  Cardiac BNP 63.0, high-sensitivity troponin IX.  Chest x-ray reported:  Evidence of pulmonary fibrosis as suspected on the CTA earlier this month with interval increased reticulonodular opacity in the right upper lobe favored to represent acute infectious exacerbation. Otherwise stable; spiculated left upper lobe lung nodule not visible radiographically.  Patient is being admitted with acute on chronic respiratory failure.     Scheduled Meds: . ascorbic acid  500 mg Oral Daily  . atorvastatin  20 mg Oral QPM  . clopidogrel  75 mg Oral Daily  . enoxaparin (LOVENOX) injection  40 mg Subcutaneous Q24H  . FLUoxetine  60 mg Oral Daily  . insulin aspart  0-15 Units Subcutaneous TID WC  . insulin aspart  4 Units Subcutaneous TID WC  . ipratropium-albuterol  3 mL Nebulization QID  . isosorbide mononitrate  15 mg Oral Daily  . levothyroxine  125 mcg Oral Once per day on Mon Tue Wed Thu Fri Sat  . levothyroxine  62.5 mcg Oral Q Sun  . methylPREDNISolone (SOLU-MEDROL) injection  80 mg Intravenous Q6H  . metoprolol succinate  50 mg Oral Daily  . nicotine  21 mg Transdermal Daily  . pantoprazole  40 mg Oral Daily  . polyethylene glycol  17 g Oral Daily  . saccharomyces  boulardii  250 mg Oral BID  . zinc sulfate  220 mg Oral Daily   Continuous Infusions:  PRN Meds:.acetaminophen, ALPRAZolam, amitriptyline, bisacodyl, guaiFENesin-dextromethorphan, HYDROcodone-acetaminophen, ipratropium-albuterol, nitroGLYCERIN     Interim History / Subjective:  No real change sob/ cough since admit   Objective   Blood pressure (!) 161/88, pulse 60, temperature (!) 97.1 F (36.2 C), resp. rate 19, height 5' 6"  (1.676 m), weight 106.6 kg, SpO2 92 %.    FiO2 (%):  [100 %] 100 %   Intake/Output Summary (Last 24 hours) at 10/25/2020 1318 Last  data filed at 10/25/2020 0300 Gross per 24 hour  Intake 240 ml  Output 650 ml  Net -410 ml   Filed Weights   10/21/20 0524  Weight: 106.6 kg    Examination: General:  tmax 98 Pt alert, approp mild increased wob at 45 degrees hob No jvd Oropharynx clear,  mucosa nl Neck supple Lungs with insp crackles bilaterally RRR no s3 or or sign murmur Abd obese with limited excursion  Extr warm with no edema or clubbing noted Neuro  Sensorium intact,  no apparent motor deficits        I personally reviewed images and agree with radiology impression as follows:  CXR:   Portable 3/21 1. Suspected pulmonary fibrosis with continued increased reticulonodular opacity in the right upper lobe compared to earlier this month, possibly acute infectious exacerbation.         Assessment & Plan:  1)  PF s/p covid 19 ? Underlying uip with superimposed alveolitis (GG changes) a portion of which certainly likely to represent covid 19 FP dz vs acute flare of UIP, both best rx = steroids/ IS / supplemental 02 - ESR  69  / procalcitonin nl   >>> continue solumedrol / abx optional as no evidence pna clinically   2) acute hypoxemic resp failure  >>> DNR noted, pt declines bipap > palliative care input needed as prognosis quite poor and very high FI02 may just be adding to pulmonary injury at this point  3) SPN LUL may just be inflammatory but given smoking hx could also be early stage neoplasm  >>> moot point for now but will need f/u certainly as outpt if we can get her thru here acute hypoxemic RF      Labs   CBC: Recent Labs  Lab 10/21/20 0548 10/22/20 0351 10/23/20 0505 10/24/20 0455 10/25/20 0415  WBC 21.0* 22.8* 27.1* 25.4* 21.5*  NEUTROABS 14.9* 20.6* 24.6* 22.1* 18.8*  HGB 13.3 12.7 12.5 12.7 12.8  HCT 43.2 41.1 41.4 42.0 41.8  MCV 90.6 90.1 91.0 91.1 89.9  PLT 441* 429* 452* 481* 460*    Basic Metabolic Panel: Recent Labs  Lab 10/21/20 0548 10/22/20 0351 10/23/20 0505  10/24/20 0455 10/25/20 0415  NA 133* 134* 137 138 138  K 4.3 3.6 4.3 4.8 4.3  CL 99 103 104 105 104  CO2 24 21* 22 23 23   GLUCOSE 136* 198* 165* 182* 178*  BUN 17 19 22  29* 32*  CREATININE 0.82 0.80 0.77 0.68 0.72  CALCIUM 8.5* 8.7* 9.1 9.1 9.0  MG  --  2.2 2.3 2.3 2.4   GFR: Estimated Creatinine Clearance: 85.4 mL/min (by C-G formula based on SCr of 0.72 mg/dL). Recent Labs  Lab 10/22/20 0351 10/23/20 0505 10/24/20 0455 10/25/20 0415  WBC 22.8* 27.1* 25.4* 21.5*    Liver Function Tests: Recent Labs  Lab 10/21/20 0548 10/22/20 0351 10/23/20 0505 10/24/20 0455 10/25/20 0415  AST 24  17 22 35 40  ALT 29 25 27  47* 62*  ALKPHOS 104 101 108 121 126  BILITOT 0.7 0.5 0.4 0.4 0.4  PROT 7.0 6.9 7.0 6.6 6.4*  ALBUMIN 2.4* 2.4* 2.4* 2.3* 2.3*   No results for input(s): LIPASE, AMYLASE in the last 168 hours. No results for input(s): AMMONIA in the last 168 hours.  ABG No results found for: PHART, PCO2ART, PO2ART, HCO3, TCO2, ACIDBASEDEF, O2SAT   Coagulation Profile: No results for input(s): INR, PROTIME in the last 168 hours.  Cardiac Enzymes: No results for input(s): CKTOTAL, CKMB, CKMBINDEX, TROPONINI in the last 168 hours.  HbA1C: Hgb A1c MFr Bld  Date/Time Value Ref Range Status  10/16/2020 09:44 AM 6.4 (H) 4.8 - 5.6 % Final    Comment:             Prediabetes: 5.7 - 6.4          Diabetes: >6.4          Glycemic control for adults with diabetes: <7.0   10/17/2019 11:50 AM 6.1 (H) 4.8 - 5.6 % Final    Comment:             Prediabetes: 5.7 - 6.4          Diabetes: >6.4          Glycemic control for adults with diabetes: <7.0     CBG: Recent Labs  Lab 10/24/20 1615 10/24/20 2020 10/25/20 0303 10/25/20 0744 10/25/20 1112  GLUCAP 192* 223* 191* 208* 188*     Christinia Gully, MD [facility] [phone]   After 7:00 pm call Elink  [phone]

## 2020-10-25 NOTE — Progress Notes (Signed)
PROGRESS NOTE   [name]  HGD:[id number] DOB: 1956-05-01 DOA: 10/21/2020 PCP: Kathyrn Drown, MD   Chief Complaint  Patient presents with  . Shortness of Breath   Level of care: Telemetry  Brief Admission History:  65 y.o. female with medical history significant for COPD and pulmonary fibrosis from 16 years of smoking (stopped 2/22), newly discovered lung mass likely primary lung cancer, CAD s/p MI with stenting, GERD, Depression, Fibromyalgia, morbid obesity, HTN, prediabetes, OSA, hypothyroidism, hyperlipidemia, GAD, arthritis presented to ED with increasing shortness of breath symptoms.  Pt recovering from Covid infection diagnosed 9/62/22 complicated by pneumonia.   She reports that she has been working with her primary care physician and was started on supplemental oxygen recently due to having hypoxia at home with a pulse ox in the 70s.  Her oxygen has increased from an initial 2 L requirement up to 3.5 L at home.  She presented to the emergency department because she continued to have symptoms of shortness of breath even after she had escalated the oxygen to 3.5 L.  She denies fever and chills. --Increased oxygen requirement now on nonrebreather bag plus high flow nasal cannula at 15 L -Patient is hoping to go home with hospice in the next day or 2  Assessment & Plan:   Principal Problem:   Acute and chronic respiratory failure with hypoxia (HCC) Active Problems:   Hypothyroidism   Mixed hyperlipidemia   Essential hypertension   CORONARY ATHEROSCLEROSIS NATIVE CORONARY ARTERY   Fibromyalgia   GERD (gastroesophageal reflux disease)   Fatty liver disease, nonalcoholic   Morbid obesity (HCC)   Cervical myelopathy (HCC)   B12 deficiency   Goals of care, counseling/discussion   Unstable angina (HCC)   Prediabetes   Dyspnea on exertion   Secondary cardiomyopathy (HCC)   Mass of upper lobe of left lung   Sleep apnea   Pulmonary fibrosis (HCC)   Leukocytosis   HCAP  (healthcare-associated pneumonia)   Palliative care by specialist   A/p Acute on chronic Hypoxic Respiratory failure -Patient is having progressive oxygen requirements since she was recently discharged on 10/12/2020.  -Please note that prior to diagnosis of COVID-19 infection on 09/21/2020 patient did not require oxygen --Repeat chest x-ray with no improvement seen. ---Increased oxygen requirement now on nonrebreather bag plus high flow nasal cannula at 15 L -Patient is hoping to go home with hospice in the next day or 2 -Continue Rocephin  for possible superimposed bacterial pneumonia -Continue IV Solu-Medrol, bronchodilators -Anticipate that leukocytosis would persist with steroid use  2)Pulmonary Fibrosis --- appears to be post-COVID pulmonary fibrosis -Leading to worsening  hypoxic respiratory failure -Management as above #1 -Pulmonology consult appreciated  3)Lt UL Mass-reticulonodular opacity in the right upper lobe  -CT PE protocol on 10/12/2020 showed spiculated nodule in the peripheral left upper lobe measuring 1.9 x 1.8 cm with enlarged bilateral hilar, mediastinal and prevascular lymph nodes, largest pretracheal nodes measuring 2.5 x 2.5 cm -Patient was seen in consultation on 10/17/2020 by oncologist Dr. Delton Coombes who recommended--PET CT scan and brain MRI with and without contrast, Per Dr. Delton Coombes patient may need navigational bronchoscopy and biopsy--  4)Obesity/OSA -Offer nightly CPAP while in hospital. -Patient is not keen on using CPAP or BiPAP  5)Coronary artery disease- s/p DES to LAD and D1 in 2005 b. cath in 11/2015 showing patent stents with mild disease along RCA and LCx -ACS ruled out, currently chest pain-free -Continue Lipitor and Plavix, as well as metoprolol and isosorbide  6)Hypothyroidism  -TSH is 1.1 reflecting euthyroid state --Patient is on about 187.5 mcg of levothyroxine, continue same  7)Tobacco Abuse- -Patient quit smoking in February 2022  after being diagnosed with COVID-19 infection -Continue nicotine patch  8)GERD -Continue protonix especially while on high-dose steroids-  9)DM2-A1c 6.4 reflecting excellent diabetic control PTA -Use Novolog/Humalog Sliding scale insulin with Accu-Cheks/Fingersticks as ordered  -Anticipate worsening glycemic control with steroids  10)Social/Ethics--DNR, palliative care consult appreciated ---Increased oxygen requirement now on nonrebreather bag plus high flow nasal cannula at 15 L -Patient is hoping to go home with hospice in the next day or 2  11)Depression and Anxiety--- a bit more anxious lately, continue Prozac 60 mg daily, Xanax as needed  DVT prophylaxis: enoxaparin   Code Status: DNR   Family Communication:   Disposition Plan: home   Consults called:  pulmonary/palliative Admission status: INP   Remains inpatient appropriate because:Inpatient level of care appropriate due to severity of illness and Severe hypoxia requiring up to 15 L of oxygen via high flow nasal cannula, significant respiratory failure/symptoms with significant dyspnea and increased work of breathing   Dispo: The patient is from: Home              Anticipated d/c is to: Home              Patient currently is not medically stable to d/c.   Difficult to place patient No  Consultants:   Pulmonary/palliative  Procedures:   N/a   Antimicrobials:  Ceftriaxone 3/20>   Subjective: - --Increased oxygen requirement now on nonrebreather bag plus high flow nasal cannula at 15 L -Patient is hoping to go home with hospice in the next day or 2 --  Objective: Vitals:   10/25/20 0303 10/25/20 0747 10/25/20 1210 10/25/20 1355  BP: (!) 161/88   (!) 111/56  Pulse: 60   60  Resp: 19   20  Temp: (!) 97.1 F (36.2 C)     TempSrc:      SpO2: 93% 92% 92% 97%  Weight:      Height:        Intake/Output Summary (Last 24 hours) at 10/25/2020 1538 Last data filed at 10/25/2020 1300 Gross per 24 hour   Intake 480 ml  Output 650 ml  Net -170 ml   Filed Weights   10/21/20 0524  Weight: 106.6 kg    Examination:   Physical Exam  Gen:- Awake Alert, obese, dyspnea on exertion with positional change in bed HEENT:- [place].AT, No sclera icterus Nose- HFNC 15L.min + NRB Neck-Supple Neck,No JVD,.  Lungs-diminished breath sounds scattered rhonchi bilaterally  CV- S1, S2 normal Abd-  +ve B.Sounds, Abd Soft, No tenderness, increased truncal adiposity    Extremity/Skin:- No  edema, good pedal pulses Psych-affect is appropriate, oriented x3 Neuro-generalized weakness, no new focal deficits, no tremors .   Data Reviewed:   CBC: Recent Labs  Lab 10/21/20 0548 10/22/20 0351 10/23/20 0505 10/24/20 0455 10/25/20 0415  WBC 21.0* 22.8* 27.1* 25.4* 21.5*  NEUTROABS 14.9* 20.6* 24.6* 22.1* 18.8*  HGB 13.3 12.7 12.5 12.7 12.8  HCT 43.2 41.1 41.4 42.0 41.8  MCV 90.6 90.1 91.0 91.1 89.9  PLT 441* 429* 452* 481* 460*    Basic Metabolic Panel: Recent Labs  Lab 10/21/20 0548 10/22/20 0351 10/23/20 0505 10/24/20 0455 10/25/20 0415  NA 133* 134* 137 138 138  K 4.3 3.6 4.3 4.8 4.3  CL 99 103 104 105 104  CO2 24 21* 22 23 23  GLUCOSE 136* 198* 165* 182* 178*  BUN 17 19 22  29* 32*  CREATININE 0.82 0.80 0.77 0.68 0.72  CALCIUM 8.5* 8.7* 9.1 9.1 9.0  MG  --  2.2 2.3 2.3 2.4    GFR: Estimated Creatinine Clearance: 85.4 mL/min (by C-G formula based on SCr of 0.72 mg/dL).  Liver Function Tests: Recent Labs  Lab 10/21/20 0548 10/22/20 0351 10/23/20 0505 10/24/20 0455 10/25/20 0415  AST 24 17 22  35 40  ALT 29 25 27  47* 62*  ALKPHOS 104 101 108 121 126  BILITOT 0.7 0.5 0.4 0.4 0.4  PROT 7.0 6.9 7.0 6.6 6.4*  ALBUMIN 2.4* 2.4* 2.4* 2.3* 2.3*    CBG: Recent Labs  Lab 10/24/20 1615 10/24/20 2020 10/25/20 0303 10/25/20 0744 10/25/20 1112  GLUCAP 192* 223* 191* 208* 188*    No results found for this or any previous visit (from the past 240 hour(s)).   Radiology  Studies: No results found.  Scheduled Meds: . ascorbic acid  500 mg Oral Daily  . atorvastatin  20 mg Oral QPM  . clopidogrel  75 mg Oral Daily  . enoxaparin (LOVENOX) injection  40 mg Subcutaneous Q24H  . FLUoxetine  60 mg Oral Daily  . insulin aspart  0-15 Units Subcutaneous TID WC  . insulin aspart  4 Units Subcutaneous TID WC  . ipratropium-albuterol  3 mL Nebulization QID  . isosorbide mononitrate  15 mg Oral Daily  . levothyroxine  125 mcg Oral Once per day on Mon Tue Wed Thu Fri Sat  . levothyroxine  62.5 mcg Oral Q Sun  . methylPREDNISolone (SOLU-MEDROL) injection  80 mg Intravenous Q6H  . metoprolol succinate  50 mg Oral Daily  . nicotine  21 mg Transdermal Daily  . pantoprazole  40 mg Oral Daily  . polyethylene glycol  17 g Oral Daily  . saccharomyces boulardii  250 mg Oral BID  . zinc sulfate  220 mg Oral Daily   Continuous Infusions:    LOS: 4 days   Roxan Hockey, MD How to contact the [facility] Attending or Consulting provider [place] or covering provider during after hours [place], for this patient?  1. Check the care team in [facility] and look for a) attending/consulting [facility] provider listed and b) the [facility] team listed 2. Log into [email or web address] and use [place]'s universal password to access. If you do not have the password, please contact the hospital operator. 3. Locate the [facility] provider you are looking for under [facility] and page to a number that you can be directly reached. 4. If you still have difficulty reaching the provider, please page the [facility] (Director on Call) for the Hospitalists listed on amion for assistance.  10/25/2020, 3:38 PM

## 2020-10-25 NOTE — Progress Notes (Signed)
Daily Progress Note   Patient Name: [name]       Date: 10/25/2020 DOB: 1956-07-19  Age: 65 y.o. MRN#: [id number] Attending Physician: Roxan Hockey, MD Primary Care Physician: Kathyrn Drown, MD Admit Date: 10/21/2020  Reason for Consultation/Follow-up: Establishing goals of care  Subjective: Patient awake, alert, oriented. Currently requiring 15L HFNC and 15L NRB mask.   GOC:  F/u GOC with patient at bedside.   Following conversations with Dr. Melvyn Novas and Dr. Denton Brick this morning, [name] has a good understanding of her condition and prognosis.   She shares that all she wants is to go home, be with her family and dog, sit in her recliner and watch tv, and sit outside on the porch and plant flowers in a pot. She again shares how blessed she has been to have 17 more years following her heart attack. She states "given a second chance." She is at peace and is "ready to go" understanding her poor prognosis. She understands home hospice services will provide her the best support. She states "they won't let me struggle."   Discussed hospice options and philosophy, emphasizing symptom management, comfort measures, and preventing recurrent hospitalization. Patient understands her high oxygen requirement but I reassured her I would do my best to get her as much oxygen as possible at home with hospice. Discussed prn xanax and roxanol for symptoms.   **Spoke with [facility] and [employer] regarding patient oxygen requirements. Decision made to pursue [facility] agency as patient lives in [place]. They can provide 10L concentrator with FIO2 (which can reach 70-100%) and allow NRB mask. Patient will need other medical equipment. Also same day admission. Discussed with Dr.  Denton Brick, [place], and hospice liaison Cassandra. [facility] hospice RN to meet with patient at bedside tomorrow morning 3/25 around 9am.   Updated patient on this plan. She is hopeful to return home tomorrow with hospice support.   Of note, patient has not told husband severity of her condition yet as she shares he suffers from anxiety. She has excellent family and friend support including husband, two step-sons (live in her home), and life-long friends [name] and [name]) whom she considers sisters. Patient will update her friends on care plan.   Also discussed care plan with respiratory therapist.    Length of Stay: 4  Current  Medications: Scheduled Meds:  . ascorbic acid  500 mg Oral Daily  . atorvastatin  20 mg Oral QPM  . clopidogrel  75 mg Oral Daily  . enoxaparin (LOVENOX) injection  40 mg Subcutaneous Q24H  . FLUoxetine  60 mg Oral Daily  . insulin aspart  0-15 Units Subcutaneous TID WC  . insulin aspart  4 Units Subcutaneous TID WC  . ipratropium-albuterol  3 mL Nebulization QID  . isosorbide mononitrate  15 mg Oral Daily  . levothyroxine  125 mcg Oral Once per day on Mon Tue Wed Thu Fri Sat  . levothyroxine  62.5 mcg Oral Q Sun  . methylPREDNISolone (SOLU-MEDROL) injection  80 mg Intravenous Q6H  . metoprolol succinate  50 mg Oral Daily  . nicotine  21 mg Transdermal Daily  . pantoprazole  40 mg Oral Daily  . polyethylene glycol  17 g Oral Daily  . saccharomyces boulardii  250 mg Oral BID  . zinc sulfate  220 mg Oral Daily    Continuous Infusions:   PRN Meds: acetaminophen, ALPRAZolam, amitriptyline, bisacodyl, guaiFENesin-dextromethorphan, HYDROcodone-acetaminophen, ipratropium-albuterol, nitroGLYCERIN  Physical Exam Vitals and nursing note reviewed.  Constitutional:      General: She is awake.  Cardiovascular:     Rate and Rhythm: Normal rate.  Pulmonary:     Effort: No tachypnea, accessory muscle usage or respiratory distress.     Comments: Dyspnea with exertion.  Requiring 15L HFNC and 15L NRB mask.  Skin:    General: Skin is warm and dry.  Neurological:     Mental Status: She is alert and oriented to person, place, and time.  Psychiatric:        Mood and Affect: Mood normal.        Speech: Speech normal.        Behavior: Behavior normal.        Cognition and Memory: Cognition normal.            Vital Signs: BP (!) 161/88 (BP Location: Right Arm)   Pulse 60   Temp (!) 97.1 F (36.2 C)   Resp 19   Ht 5\' 6"  (1.676 m)   Wt 106.6 kg   SpO2 92%   BMI 37.93 kg/m  SpO2: SpO2: 92 % O2 Device: O2 Device: High Flow Nasal Cannula,NRB O2 Flow Rate: O2 Flow Rate (L/min): 15 L/min  Intake/output summary:   Intake/Output Summary (Last 24 hours) at 10/25/2020 0954 Last data filed at 10/25/2020 0300 Gross per 24 hour  Intake 480 ml  Output 1050 ml  Net -570 ml   LBM: Last BM Date: 10/20/20 Baseline Weight: Weight: 106.6 kg Most recent weight: Weight: 106.6 kg       Palliative Assessment/Data: PPS 50%    Flowsheet Rows   Flowsheet Row Most Recent Value  Intake Tab   Referral Department Hospitalist  Unit at Time of Referral Cardiac/Telemetry Unit  Palliative Care Primary Diagnosis Pulmonary  Palliative Care Type New Palliative care  Reason for referral Clarify Goals of Care  Date first seen by Palliative Care 10/23/20  Clinical Assessment   Palliative Performance Scale Score 50%  Psychosocial & Spiritual Assessment   Palliative Care Outcomes   Patient/Family meeting held? Yes  Who was at the meeting? patient  Palliative Care Outcomes Clarified goals of care, Provided psychosocial or spiritual support, ACP counseling assistance      Patient Active Problem List   Diagnosis Date Noted  . HCAP (healthcare-associated pneumonia)   . Palliative care by specialist   .  Acute and chronic respiratory failure with hypoxia ([place]) 10/21/2020  . Sleep apnea   . Pulmonary fibrosis ([place])   . Leukocytosis   . Mass of upper lobe of left lung  10/17/2020  . Secondary cardiomyopathy ([place]) 10/15/2020  . Acute respiratory failure with hypoxia ([place]) 10/12/2020  . Lung mass 10/12/2020  . Dyspnea on exertion 09/21/2020  . Prediabetes 07/17/2020  . Fatigue 07/17/2020  . Need for vaccination 06/27/2020  . Breast abscess of female 06/15/2020  . Bronchitis with wheezing 06/15/2020  . Unstable angina ([place]) 10/21/2017  . Goals of care, counseling/discussion 10/09/2017  . B12 deficiency 03/17/2017  . Bradycardia   . NSTEMI (non-ST elevated myocardial infarction) ([place]) 11/21/2015  . Chest pain on exertion 11/20/2015  . Cervical myelopathy ([place]) 09/04/2015  . Morbid obesity ([place]) 09/12/2014  . External hemorrhoid 04/30/2014  . Irritable bowel syndrome 04/30/2014  . Fatty liver disease, nonalcoholic 91/47/8295  . Impaired fasting glucose 11/18/2013  . Anxiety and depression 11/10/2013  . GERD (gastroesophageal reflux disease) 11/10/2013  . Scalp psoriasis 12/29/2012  . Fibromyalgia 12/29/2012  . Essential hypertension 02/08/2010  . Hypothyroidism 03/08/2009  . Mixed hyperlipidemia 03/08/2009  . CORONARY ATHEROSCLEROSIS NATIVE CORONARY ARTERY 03/08/2009    Palliative Care Assessment & Plan   Patient Profile: 65 y.o.femalewith medical history significantfor COPD and pulmonary fibrosis from 50 years of smoking (stopped 2/22), newly discovered lung mass likely primary lung cancer, CAD s/p MI with stenting, GERD, Depression, Fibromyalgia, morbid obesity, HTN, prediabetes, OSA, hypothyroidism, hyperlipidemia, GAD, arthritis presented to ED with increasing shortness of breath symptoms. Pt recovering from Covid infection diagnosed 01/22/29 complicated by pneumonia. She reports that she has been working with her primary care physician and was started on supplemental oxygen recently due to having hypoxia at home with a pulse ox in the 70s. Hospital admission for acute on chronic respiratory failure due to HCAP, pulmonary fibrosis, and new  lung mass, currently requiring HFNC 13L. OSA requiring HS CPAP. Patient is established with oncology Dr. Delton Coombes with plans for PET scan, brain MRI, and ? Bronch. Patient with higher oxygen requirements since hospital discharge on 10/12/20. Pending pulmonary consultation. Palliative medicine consultation for goals of care.   Assessment: Acute on chronic hypoxic respiratory failure Pulmonary fibrosis, post-COVID fibrosis Left upper lobe lung mass Obesity/OSA CAD Hx of tobacco abuse, recently quit DM type 2 Depression Anxiety  Recommendations/Plan: DNR/DNI, no escalation of care. Following conversations with Dr. Melvyn Novas and Dr. Denton Brick, patient understands her condition and poor prognosis. She wants nothing more than to return home with hospice support and spend her final time with family and friends.  Coordinating with [facility] and [facility] agency to get her home with hospice support. Will need 10L oxygen concentrator and ongoing NRB. Also recommend same day home hospice admission once discharged. [facility] hospice RN to meet with patient 3/25 around 9am.  Continue current plan of care and medical management.  PMT provider will f/u in am to assist with plan.  Code Status: DNR   Code Status Orders  (From admission, onward)         Start     Ordered   10/21/20 0747  Do not attempt resuscitation (DNR)  Continuous       Question Answer Comment  In the event of cardiac or respiratory ARREST Do not call a "code blue"   In the event of cardiac or respiratory ARREST Do not perform Intubation, CPR, defibrillation or ACLS   In the event of cardiac or respiratory ARREST Use  medication by any route, position, wound care, and other measures to relive pain and suffering. May use oxygen, suction and manual treatment of airway obstruction as needed for comfort.      10/21/20 0755        Code Status History    Date Active Date Inactive Code Status Order ID Comments User Context    10/12/2020 2247 10/13/2020 2131 DNR [id number]  Truett Mainland, DO Inpatient   10/21/2017 1748 10/22/2017 1611 Full Code [id number]  Wellington Hampshire, MD Inpatient   10/21/2017 1748 10/21/2017 1748 Full Code [id number]  Waynetta Pean Inpatient   11/20/2015 2115 11/22/2015 1659 Full Code [id number]  Phillips Grout, MD Inpatient   Advance Care Planning Activity      Prognosis: Poor prognosis with acute respiratory failure following COVID-19 pneumonia, now covid fibrosis. Possible lung neoplasm.   Discharge Planning: Home with Hospice  Care plan was discussed with Dr. Denton Brick, patient, hospice liaisons, SW  Thank you for allowing the Palliative Medicine Team to assist in the care of this patient.   Total Time 75 Prolonged Time Billed  yes      Greater than 50%  of this time was spent counseling and coordinating care related to the above assessment and plan.  Ihor Dow, DNP, FNP-C Palliative Medicine Team  Phone: [phone] Fax: [phone]  Please contact Palliative Medicine Team phone at [phone] for questions and concerns.

## 2020-10-25 NOTE — Progress Notes (Addendum)
NAME:  [name], MRN:  [id number], DOB:  [birth year], LOS: 4 ADMISSION DATE:  10/21/2020, CONSULTATION DATE:  3/22 REFERRING MD:  [facility]/ Dr Wynetta Emery , CHIEF COMPLAINT: UIP with SPN LUL   History of Present Illness:  65 yowf quit smoking 09/2020 with progressive sob x 2 y but still @ [facility] = can't walk a nl pace on a flat grade s sob but does fine slow and flat mall walking but then Covid mid 09/2020 with acute on chronic dyspnea p dx and  w/u pos for UIP by CT chest 10/13/10 and LUL SPN with w/u planned for PET 10/31/20 but in meantime recovering from   worsening hypoxemia as outpt so admitted 10/22/18 and pccm service consulted   3/22   With covid main symptoms were fever/ ha/ never vaccinated  Now with cough with slt purulent sputum, no cp or unusual exp hx or ongoing ha/ ST, dysphagia/ abd complaint.  Arthralgias = baseline "fibromyalgia" with no rheum dx or prior w/u and no h/o bird exp.  Pertinent  Medical History  65 y.o. female with medical history significant for COPD and pulmonary fibrosis from 17 years of smoking (stopped 2/22), newly discovered lung mass likely primary lung cancer, CAD s/p MI with stenting, GERD, Depression, Fibromyalgia, morbid obesity, HTN, prediabetes, OSA, hypothyroidism, hyperlipidemia, GAD, arthritis presented to ED with increasing shortness of breath symptoms.  Pt recovering from Covid infection diagnosed 09/04/98 complicated by pneumonia.   She reports that she has been working with her primary care physician and was started on supplemental oxygen recently due to having hypoxia at home with a pulse ox in the 70s.  Her oxygen has increased from an initial 2 L requirement up to 3.5 L at home.  She presented to the emergency department because she continued to have symptoms of shortness of breath even after she had escalated the oxygen to 3.5 L.  She denies fever and chills.  ED Course: Patient is afebrile With a heart rate of 24, blood pressure 108/72, pulse ox 100%  on 3.5 L.  Patient had a sodium of 133, potassium 4.3, glucose 136, calcium 8.5, albumin 2.4, creatinine 0.82.  WBC 21.0, hemoglobin 13.3, hematocrit 43.2, platelets 441.  Cardiac BNP 63.0, high-sensitivity troponin IX.  Chest x-ray reported:  Evidence of pulmonary fibrosis as suspected on the CTA earlier this month with interval increased reticulonodular opacity in the right upper lobe favored to represent acute infectious exacerbation. Otherwise stable; spiculated left upper lobe lung nodule not visible radiographically.  Patient is being admitted with acute on chronic respiratory failure.     Scheduled Meds: . ascorbic acid  500 mg Oral Daily  . atorvastatin  20 mg Oral QPM  . clopidogrel  75 mg Oral Daily  . enoxaparin (LOVENOX) injection  40 mg Subcutaneous Q24H  . FLUoxetine  60 mg Oral Daily  . insulin aspart  0-15 Units Subcutaneous TID WC  . insulin aspart  4 Units Subcutaneous TID WC  . ipratropium-albuterol  3 mL Nebulization QID  . isosorbide mononitrate  15 mg Oral Daily  . levothyroxine  125 mcg Oral Once per day on Mon Tue Wed Thu Fri Sat  . levothyroxine  62.5 mcg Oral Q Sun  . methylPREDNISolone (SOLU-MEDROL) injection  80 mg Intravenous Q6H  . metoprolol succinate  50 mg Oral Daily  . nicotine  21 mg Transdermal Daily  . pantoprazole  40 mg Oral Daily  . polyethylene glycol  17 g Oral Daily  . saccharomyces  boulardii  250 mg Oral BID  . zinc sulfate  220 mg Oral Daily   Continuous Infusions:  PRN Meds:.acetaminophen, ALPRAZolam, amitriptyline, bisacodyl, guaiFENesin-dextromethorphan, HYDROcodone-acetaminophen, ipratropium-albuterol, nitroGLYCERIN     Interim History / Subjective:  No better, refusing cpap   Objective   Blood pressure (!) 161/88, pulse 60, temperature (!) 97.1 F (36.2 C), resp. rate 19, height 5' 6"  (1.676 m), weight 106.6 kg, SpO2 92 %.    FiO2 (%):  [100 %] 100 %   Intake/Output Summary (Last 24 hours) at 10/25/2020 1324 Last data filed at  10/25/2020 0300 Gross per 24 hour  Intake 240 ml  Output 650 ml  Net -410 ml   Filed Weights   10/21/20 0524  Weight: 106.6 kg    Examination: General:  tmax 98 Pt alert, approp mild increased wob at 45 degrees hob with sats low 90s on max 02  No jvd Oropharynx clear,  mucosa nl Neck supple Lungs with insp crackles bilaterally RRR no s3 or or sign murmur Abd obese with limited excursion  Extr warm with no edema or clubbing noted Neuro  Sensorium intact,  no apparent motor deficits        I personally reviewed images and agree with radiology impression as follows:  CXR:   Portable 3/21 1. Suspected pulmonary fibrosis with continued increased reticulonodular opacity in the right upper lobe compared to earlier this month, possibly acute infectious exacerbation.         Assessment & Plan:  1)  PF s/p covid 19 ? Underlying uip with superimposed alveolitis (GG changes) a portion of which certainly likely to represent covid 19 FP dz vs acute flare of UIP, both best rx = steroids/ IS / supplemental 02 - ESR  69  / procalcitonin nl   >>> continue solumedrol / abx optional as no evidence pna clinically   2) acute hypoxemic resp failure  >>> DNR noted, pt declines bipap > palliative care input needed as 65 prognosis quite poor and very high FI02 may just be adding to pulmonary injury at this point  3) SPN LUL may just be inflammatory but given smoking hx could also be early stage neoplasm  >>> moot point for now but will need f/u certainly as outpt if we can get her thru here acute hypoxemic RF      Labs   CBC: Recent Labs  Lab 10/21/20 0548 10/22/20 0351 10/23/20 0505 10/24/20 0455 10/25/20 0415  WBC 21.0* 22.8* 27.1* 25.4* 21.5*  NEUTROABS 14.9* 20.6* 24.6* 22.1* 18.8*  HGB 13.3 12.7 12.5 12.7 12.8  HCT 43.2 41.1 41.4 42.0 41.8  MCV 90.6 90.1 91.0 91.1 89.9  PLT 441* 429* 452* 481* 460*    Basic Metabolic Panel: Recent Labs  Lab 10/21/20 0548 10/22/20 0351  10/23/20 0505 10/24/20 0455 10/25/20 0415  NA 133* 134* 137 138 138  K 4.3 3.6 4.3 4.8 4.3  CL 99 103 104 105 104  CO2 24 21* 22 23 23   GLUCOSE 136* 198* 165* 182* 178*  BUN 17 19 22  29* 32*  CREATININE 0.82 0.80 0.77 0.68 0.72  CALCIUM 8.5* 8.7* 9.1 9.1 9.0  MG  --  2.2 2.3 2.3 2.4   GFR: Estimated Creatinine Clearance: 85.4 mL/min (by C-G formula based on SCr of 0.72 mg/dL). Recent Labs  Lab 10/22/20 0351 10/23/20 0505 10/24/20 0455 10/25/20 0415  WBC 22.8* 27.1* 25.4* 21.5*    Liver Function Tests: Recent Labs  Lab 10/21/20 9794 10/22/20 0351 10/23/20 0505 10/24/20 0455  10/25/20 0415  AST 24 17 22  35 40  ALT 29 25 27  47* 62*  ALKPHOS 104 101 108 121 126  BILITOT 0.7 0.5 0.4 0.4 0.4  PROT 7.0 6.9 7.0 6.6 6.4*  ALBUMIN 2.4* 2.4* 2.4* 2.3* 2.3*   No results for input(s): LIPASE, AMYLASE in the last 168 hours. No results for input(s): AMMONIA in the last 168 hours.  ABG No results found for: PHART, PCO2ART, PO2ART, HCO3, TCO2, ACIDBASEDEF, O2SAT   Coagulation Profile: No results for input(s): INR, PROTIME in the last 168 hours.  Cardiac Enzymes: No results for input(s): CKTOTAL, CKMB, CKMBINDEX, TROPONINI in the last 168 hours.  HbA1C: Hgb A1c MFr Bld  Date/Time Value Ref Range Status  10/16/2020 09:44 AM 6.4 (H) 4.8 - 5.6 % Final    Comment:             Prediabetes: 5.7 - 6.4          Diabetes: >6.4          Glycemic control for adults with diabetes: <7.0   10/17/2019 11:50 AM 6.1 (H) 4.8 - 5.6 % Final    Comment:             Prediabetes: 5.7 - 6.4          Diabetes: >6.4          Glycemic control for adults with diabetes: <7.0     CBG: Recent Labs  Lab 10/24/20 1615 10/24/20 2020 10/25/20 0303 10/25/20 0744 10/25/20 1112  GLUCAP 192* 223* 191* 208* 188*     Christinia Gully, MD [facility] [phone]   After 7:00 pm call Elink  [phone]

## 2020-10-25 NOTE — TOC Progression Note (Signed)
Transition of Care [facility]) - Progression Note    Patient Details  Name: [name] MRN: [id number] Date of Birth: 1956-01-19  Transition of Care [facility]) CM/SW Contact  Ihor Gully, LCSW Phone Number: 10/25/2020, 3:37 PM  Clinical Narrative:    Palliative Care NP made referral to [facility] hospice for hospice in home.    Expected Discharge Plan: Home w Hospice Care Barriers to Discharge: No Barriers Identified  Expected Discharge Plan and Services Expected Discharge Plan: Onset         Expected Discharge Date: 10/23/20                                     Social Determinants of Health (SDOH) Interventions    Readmission Risk Interventions No flowsheet data found.

## 2020-10-25 NOTE — Progress Notes (Signed)
PT Cancellation Note  Patient Details Name: KIYO HEAL MRN: [id number] DOB: 10-23-1955   Cancelled Treatment:    Reason Eval/Treat Not Completed: Patient declined, no reason specified.  Patient presents up in chair and declined therapy due to fatigue and difficulty breathing.  Will check back tomorrow.   11:55 AM, 10/25/20 Lonell Grandchild, MPT Physical Therapist with [facility] [phone] office [phone] mobile phone

## 2020-10-26 DIAGNOSIS — J069 Acute upper respiratory infection, unspecified: Secondary | ICD-10-CM

## 2020-10-26 DIAGNOSIS — U071 COVID-19: Secondary | ICD-10-CM

## 2020-10-26 LAB — COMPREHENSIVE METABOLIC PANEL
ALT: 109 U/L — ABNORMAL HIGH (ref 0–44)
AST: 56 U/L — ABNORMAL HIGH (ref 15–41)
Albumin: 2.2 g/dL — ABNORMAL LOW (ref 3.5–5.0)
Alkaline Phosphatase: 129 U/L — ABNORMAL HIGH (ref 38–126)
Anion gap: 10 (ref 5–15)
BUN: 27 mg/dL — ABNORMAL HIGH (ref 8–23)
CO2: 25 mmol/L (ref 22–32)
Calcium: 9 mg/dL (ref 8.9–10.3)
Chloride: 104 mmol/L (ref 98–111)
Creatinine, Ser: 0.64 mg/dL (ref 0.44–1.00)
GFR, Estimated: >60 mL/min (ref >60)
Glucose, Bld: 168 mg/dL — ABNORMAL HIGH (ref 70–99)
Potassium: 4.7 mmol/L (ref 3.5–5.1)
Sodium: 139 mmol/L (ref 135–145)
Total Bilirubin: 0.4 mg/dL (ref 0.3–1.2)
Total Protein: 6.5 g/dL (ref 6.5–8.1)

## 2020-10-26 LAB — MAGNESIUM: Magnesium: 2.4 mg/dL (ref 1.7–2.4)

## 2020-10-26 LAB — CBC WITH DIFFERENTIAL/PLATELET
Abs Immature Granulocytes: 0.49 10*3/uL — ABNORMAL HIGH (ref 0.00–0.07)
Basophils Absolute: 0.1 10*3/uL (ref 0.0–0.1)
Basophils Relative: 0 %
Eosinophils Absolute: 0 10*3/uL (ref 0.0–0.5)
Eosinophils Relative: 0 %
HCT: 43.6 % (ref 36.0–46.0)
Hemoglobin: 13 g/dL (ref 12.0–15.0)
Immature Granulocytes: 2 %
Lymphocytes Relative: 4 %
Lymphs Abs: 0.9 10*3/uL (ref 0.7–4.0)
MCH: 27.2 pg (ref 26.0–34.0)
MCHC: 29.8 g/dL — ABNORMAL LOW (ref 30.0–36.0)
MCV: 91.2 fL (ref 80.0–100.0)
Monocytes Absolute: 1.6 10*3/uL — ABNORMAL HIGH (ref 0.1–1.0)
Monocytes Relative: 7 %
Neutro Abs: 20.3 10*3/uL — ABNORMAL HIGH (ref 1.7–7.7)
Neutrophils Relative %: 87 %
Platelets: 440 10*3/uL — ABNORMAL HIGH (ref 150–400)
RBC: 4.78 MIL/uL (ref 3.87–5.11)
RDW: 16.3 % — ABNORMAL HIGH (ref 11.5–15.5)
WBC: 23.3 10*3/uL — ABNORMAL HIGH (ref 4.0–10.5)
nRBC: 0 % (ref 0.0–0.2)

## 2020-10-26 LAB — GLUCOSE, CAPILLARY
Glucose-Capillary: 154 mg/dL — ABNORMAL HIGH (ref 70–99)
Glucose-Capillary: 144 mg/dL — ABNORMAL HIGH (ref 70–99)
Glucose-Capillary: 186 mg/dL — ABNORMAL HIGH (ref 70–99)
Glucose-Capillary: 162 mg/dL — ABNORMAL HIGH (ref 70–99)

## 2020-10-26 MED ORDER — MORPHINE SULFATE (CONCENTRATE) 10 MG/0.5ML PO SOLN
5.0000 mg | Freq: Four times a day (QID) | ORAL | Status: DC
  Filled 2020-10-26: qty 0.5

## 2020-10-26 MED ORDER — SENNOSIDES-DOCUSATE SODIUM 8.6-50 MG PO TABS: 2.0000 | ORAL_TABLET | Freq: Every day | ORAL | 1 refills | Status: AC

## 2020-10-26 MED ORDER — MORPHINE SULFATE (CONCENTRATE) 10 MG/0.5ML PO SOLN
10.0000 mg | ORAL | Status: DC | PRN
  Administered 2020-10-26 (×2): 10 mg via SUBLINGUAL
  Filled 2020-10-26 (×2): qty 0.5

## 2020-10-26 MED ORDER — LORAZEPAM 2 MG/ML IJ SOLN: 1.0000 mg | INTRAMUSCULAR | Status: DC | PRN

## 2020-10-26 MED ORDER — POLYETHYLENE GLYCOL 3350 17 G PO PACK: 17.0000 g | PACK | Freq: Every day | ORAL | 0 refills | Status: AC

## 2020-10-26 MED ORDER — FENTANYL 25 MCG/HR TD PT72
1.0000 | MEDICATED_PATCH | TRANSDERMAL | Status: DC
Start: 2020-10-26 — End: 2020-10-27
  Administered 2020-10-26: 1 via TRANSDERMAL
  Filled 2020-10-26: qty 1

## 2020-10-26 MED ORDER — LORAZEPAM 2 MG/ML IJ SOLN
1.0000 mg | INTRAMUSCULAR | Status: AC
  Administered 2020-10-26: 1 mg via INTRAVENOUS
  Filled 2020-10-26 (×2): qty 1

## 2020-10-26 MED ORDER — LORAZEPAM 2 MG/ML PO CONC: 1.0000 mg | Freq: Four times a day (QID) | ORAL | 0 refills | Status: AC | PRN

## 2020-10-26 MED ORDER — MORPHINE SULFATE (PF) 2 MG/ML IV SOLN
2.0000 mg | INTRAVENOUS | Status: DC | PRN
  Administered 2020-10-26: 2 mg via INTRAVENOUS
  Filled 2020-10-26: qty 1

## 2020-10-26 MED ORDER — MORPHINE SULFATE (PF) 2 MG/ML IV SOLN
2.0000 mg | INTRAVENOUS | Status: AC
Start: 2020-10-26 — End: 2020-10-26
  Administered 2020-10-26: 2 mg via INTRAVENOUS
  Filled 2020-10-26 (×2): qty 1

## 2020-10-26 MED ORDER — MORPHINE SULFATE (CONCENTRATE) 10 MG/0.5ML PO SOLN: 5.0000 mg | ORAL | 0 refills | Status: AC | PRN

## 2020-10-26 NOTE — Care Management Important Message (Signed)
Important Message  Patient Details  Name: [name] MRN: [id number] Date of Birth: 1955/10/01   Medicare Important Message Given:  Yes     Tommy Medal 10/26/2020, 12:26 PM

## 2020-10-26 NOTE — Discharge Summary (Signed)
[name], is a 65 y.o. female  DOB 02-01-1956  MRN [id number].  Admission date:  10/21/2020  Admitting Physician  Murlean Iba, MD  Discharge Date:  10/26/2020   Primary MD  Kathyrn Drown, MD  Recommendations for primary care physician for things to follow:   Discharge home with hospice services and full comfort care measures and home oxygen  Admission Diagnosis  Hypoxia [R09.02] Acute and chronic respiratory failure with hypoxia ([place]) [J96.21] HCAP (healthcare-associated pneumonia) [J18.9]   Discharge Diagnosis  Hypoxia [R09.02] Acute and chronic respiratory failure with hypoxia ([place]) [J96.21] HCAP (healthcare-associated pneumonia) [J18.9]    Principal Problem:   Acute respiratory disease due to COVID-19 virus with Severe hypoxia due to 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 Active Problems:   Acute respiratory failure with hypoxia due to post COVID-19 infection pulmonary fibrosis   Acute and chronic respiratory failure with hypoxia due to post-COVID lung fibrosis   Goals of care, counseling/discussion   Dyspnea on exertion   Sleep apnea   Palliative care by specialist   Hypothyroidism   Mixed hyperlipidemia   Essential hypertension   CORONARY ATHEROSCLEROSIS NATIVE CORONARY ARTERY   Fibromyalgia   GERD (gastroesophageal reflux disease)   Fatty liver disease, nonalcoholic   Morbid obesity ([place])   Cervical myelopathy ([place])   B12 deficiency   Unstable angina ([place])   Prediabetes   Secondary cardiomyopathy ([place])   Mass of upper lobe of left lung   Pulmonary fibrosis ([place])   Leukocytosis   HCAP (healthcare-associated pneumonia)      Past Medical History:  Diagnosis Date  . Anxiety   . Arthritis   . Asthma   . Cancer ([place])    lung  . Coronary atherosclerosis of native coronary artery    a. s/p DES to LAD and D1 in 2005 b. cath in 11/2015 showing patent stents  with mild disease along RCA and LCx  . Depression    Prior suicide attempt  . Essential hypertension   . Fibromyalgia   . GERD (gastroesophageal reflux disease)   . History of blood transfusion 2005  . History of kidney stones   . Hyperlipidemia   . Hypothyroidism   . Myocardial infarction ([place]) 2017  . NSTEMI (non-ST elevated myocardial infarction) ([place]) 2005  . Palpitations    PVC's have been noted  . Pulmonary fibrosis ([place])   . Sleep apnea     Past Surgical History:  Procedure Laterality Date  . CARDIAC CATHETERIZATION  ~ 2009/2010  . CARDIAC CATHETERIZATION N/A 11/21/2015   Procedure: Left Heart Cath and Coronary Angiography;  Surgeon: Burnell Blanks, MD;  Location: [facility];  Service: Cardiovascular;  Laterality: N/A;  . CARDIAC CATHETERIZATION  10/21/2017  . CARPAL TUNNEL RELEASE Bilateral 2007- 2008  . CORONARY ANGIOPLASTY WITH STENT PLACEMENT  2005  . FOOT BONE EXCISION     Sesmoid bone left foot-repair of fracture  . LEFT HEART CATH AND CORONARY ANGIOGRAPHY N/A 10/21/2017   Procedure: LEFT HEART CATH AND CORONARY ANGIOGRAPHY;  Surgeon: Wellington Hampshire, MD;  Location: [facility];  Service: Cardiovascular;  Laterality: N/A;  . TUBAL LIGATION        HPI  from the history and physical done on the day of admission:    Chief Complaint: SOB  HPI: [name] is a 65 y.o. female with medical history significant for COPD and pulmonary fibrosis from 59 years of smoking (stopped 2/22), newly discovered lung mass likely primary lung cancer, CAD s/p MI with stenting, GERD, Depression, Fibromyalgia, morbid obesity, HTN, prediabetes, OSA, hypothyroidism, hyperlipidemia, GAD, arthritis presented to ED with increasing shortness of breath symptoms.  Pt recovering from Covid infection diagnosed 1/61/09 complicated by pneumonia.   She reports that she has been working with her primary care physician and was started on supplemental oxygen recently due to  having hypoxia at home with a pulse ox in the 70s.  Her oxygen has increased from an initial 2 L requirement up to 3.5 L at home.  She presented to the emergency department because she continued to have symptoms of shortness of breath even after she had escalated the oxygen to 3.5 L.  She denies fever and chills.  ED Course: Patient is afebrile With a heart rate of 24, blood pressure 108/72, pulse ox 100% on 3.5 L.  Patient had a sodium of 133, potassium 4.3, glucose 136, calcium 8.5, albumin 2.4, creatinine 0.82.  WBC 21.0, hemoglobin 13.3, hematocrit 43.2, platelets 441.  Cardiac BNP 63.0, high-sensitivity troponin IX.  Chest x-ray reported:  Evidence of pulmonary fibrosis as suspected on the CTA earlier this month with interval increased reticulonodular opacity in the right upper lobe favored to represent acute infectious exacerbation. Otherwise stable; spiculated left upper lobe lung nodule not visible radiographically.  Patient is being admitted with acute on chronic respiratory failure.    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  Acute and chronic respiratory failure with hypoxia due to post-COVID lung fibrosis  Acute respiratory failure with hypoxia due to post COVID-19 infection pulmonary fibrosis  Palliative Care By Specialist  Sleep Apnea  Dyspnea On Exertion  Goals of Care, Counseling/Discussion     Brief Admission History:  65 y.o.femalewith medical history significantfor COPD and pulmonary fibrosis from 51 years of smoking (stopped 2/22), newly discovered lung mass likely primary lung cancer, CAD s/p MI with stenting, GERD, Depression, Fibromyalgia, morbid obesity, HTN, prediabetes, OSA, hypothyroidism, hyperlipidemia, GAD, arthritis presented to ED with increasing shortness of breath symptoms. Pt recovering from Covid infection diagnosed 01/05/53 complicated by pneumonia.  She reports that she has been working with her primary care physician and was started on supplemental oxygen recently due to having hypoxia at home with a pulse ox in the 70s. Her oxygen has increased from an initial 2 L requirement up to 3.5 L at home. She presented to the emergency department because she continued to have symptoms of shortness of breath even after she had escalated the oxygen to 3.5 L. She denies fever and chills. --Increased oxygen requirement now on nonrebreather bag plus high flow nasal cannula at 15 L -Patient is going home with hospice   Discharge Condition: Prognosis is grave, anticipate death within days to weeks -Discharge home with hospice services and full comfort care measures and home oxygen  Follow UP--hospice team   Consults obtained -palliative care/hospice  Diet and Activity recommendation:  As advised  Discharge Instructions  Discharge Instructions    Call MD for:  persistant dizziness or light-headedness   Complete by: As directed    Call MD for:  persistant nausea and vomiting   Complete by: As directed    Call MD for:  severe uncontrolled pain   Complete by: As directed    Call MD for:  temperature >100.4   Complete by: As directed    Diet general   Complete by: As directed    Discharge instructions   Complete by: As directed    Discharge home with hospice services and full comfort care measures and home oxygen   Increase activity slowly   Complete by: As directed       Discharge Medications     Allergies as of 10/26/2020      Reactions   Cymbalta [duloxetine Hcl] Other (See Comments)   Felt bad   Lyrica [pregabalin] Other (See Comments)   Retained fluids      Medication List    STOP taking these medications   ALPRAZolam 0.5 MG tablet Commonly known as: Xanax   amitriptyline 25 MG tablet Commonly known as: ELAVIL   ascorbic acid 500 MG tablet Commonly known as: VITAMIN C   atorvastatin 20 MG tablet Commonly known  as: LIPITOR   azithromycin 250 MG tablet Commonly known as: Zithromax Z-Pak   Biotin 10000 MCG Tabs   clopidogrel 75 MG tablet Commonly known as: PLAVIX   FLUoxetine 20 MG capsule Commonly known as: PROZAC   HYDROcodone-acetaminophen 5-325 MG tablet Commonly known as: NORCO/VICODIN   isosorbide mononitrate 30 MG 24 hr tablet Commonly known as: IMDUR   levothyroxine 125 MCG tablet Commonly known as: SYNTHROID   lidocaine 5 % Commonly known as: LIDODERM   metoprolol succinate 50 MG 24 hr tablet Commonly known as: TOPROL-XL   nitroGLYCERIN 0.4 MG SL tablet Commonly known as: NITROSTAT   pantoprazole 40 MG tablet Commonly known as: PROTONIX   tiZANidine 4 MG tablet Commonly known as: ZANAFLEX   VITAMIN B-12 PO   zinc sulfate 220 (50 Zn) MG capsule     TAKE these medications   acetaminophen 325 MG tablet Commonly known as: TYLENOL Take 650 mg by mouth every 6 (six) hours as needed for mild pain or headache.   albuterol 108 (90 Base) MCG/ACT inhaler Commonly known as: VENTOLIN HFA Inhale 2 puffs into the lungs every 6 (six) hours as needed for wheezing or shortness of breath.   guaiFENesin-dextromethorphan 100-10 MG/5ML syrup Commonly known as: ROBITUSSIN DM Take 10 mLs by mouth every 4 (four) hours as needed for cough.   hydrOXYzine 25 MG tablet Commonly known as: ATARAX/VISTARIL Take 25-50 mg by mouth daily as needed for itching.   LORazepam 2 MG/ML concentrated solution Commonly known as: LORazepam Intensol Take 0.5 mLs (1 mg total) by mouth every 6 (six) hours as needed for anxiety or sleep.   morphine CONCENTRATE 10 MG/0.5ML Soln concentrated solution Place 0.25 mLs (5 mg total) under the tongue every 2 (two) hours as needed for moderate pain or shortness of breath (dyspnea/air hunger/tachypnea).   polyethylene glycol 17 g packet Commonly known as: MIRALAX / GLYCOLAX Take 17 g by mouth daily.   senna-docusate 8.6-50 MG tablet Commonly known as:  Senokot-S Take 2 tablets by mouth at bedtime.      Major procedures and Radiology Reports - PLEASE review detailed and final reports for all details, in brief -   DG Chest 2 View  Result Date: 10/11/2020 CLINICAL DATA:  Shortness  of breath EXAM: CHEST - 2 VIEW COMPARISON:  09/21/2020 FINDINGS: Stable cardiomediastinal silhouette. Atherosclerotic calcification of the aortic knob. Diffuse interstitial opacities bilaterally, most pronounced within the perihilar and bibasilar regions. No pleural effusion or pneumothorax. IMPRESSION: Diffuse interstitial opacities bilaterally, most pronounced within the perihilar and bibasilar regions. Findings suggestive of multifocal atypical/viral pneumonia. Pulmonary edema would have a similar appearance. Electronically Signed   By: Davina Poke D.O.   On: 10/11/2020 16:59   CT Angio Chest PE W/Cm &/Or Wo Cm  Result Date: 10/12/2020 CLINICAL DATA:  PE suspected EXAM: CT ANGIOGRAPHY CHEST WITH CONTRAST TECHNIQUE: Multidetector CT imaging of the chest was performed using the standard protocol during bolus administration of intravenous contrast. Multiplanar CT image reconstructions and MIPs were obtained to evaluate the vascular anatomy. CONTRAST:  16mL OMNIPAQUE IOHEXOL 350 MG/ML SOLN COMPARISON:  CT abdomen pelvis, 10/24/2015 FINDINGS: Cardiovascular: Satisfactory opacification of the pulmonary arteries to the segmental level. No evidence of pulmonary embolism. Mild cardiomegaly. Extensive 3 vessel coronary artery calcifications and/or stents. No pericardial effusion. Aortic atherosclerosis. Mediastinum/Nodes: There are enlarged bilateral hilar, mediastinal, and prevascular lymph nodes, largest pretracheal nodes measuring 2.5 x 2.5 cm (series 5, image 34). Small hiatal hernia. Thyroid gland, trachea, and esophagus demonstrate no significant findings. Lungs/Pleura: There is moderate to severe pulmonary fibrosis in a pattern with apical to basal gradient featuring  irregular peripheral interstitial opacity, septal thickening, traction bronchiectasis, areas of subpleural bronchiolectasis and honeycombing. There are scattered areas of superimposed ground-glass and heterogeneous airspace opacity. There is a spiculated nodule of the peripheral left upper lobe measuring 1.9 x 1.8 cm (series 7, image 48). No pleural effusion or pneumothorax. Upper Abdomen: No acute abnormality. Musculoskeletal: No chest wall abnormality. No acute or significant osseous findings. Review of the MIP images confirms the above findings. IMPRESSION: 1. Negative examination for pulmonary embolism. 2. There is moderate to severe pulmonary fibrosis in a pattern with apical to basal gradient featuring irregular peripheral interstitial opacity, septal thickening, traction bronchiectasis, areas of subpleural bronchiolectasis and honeycombing. Findings are consistent with a UIP pattern of fibrosis by ATS pulmonary fibrosis criteria, and are markedly worsened in comparison to the included lung bases on CT of the abdomen pelvis dated 10/24/2015. 3. There are scattered areas of acutely superimposed ground-glass and heterogeneous airspace opacity, consistent with infection or acute inflammatory flare of fibrotic interstitial lung disease. 4. There is a spiculated nodule of the peripheral left upper lobe measuring 1.9 x 1.8 cm, highly concerning for primary lung malignancy in the high risk setting of pulmonary fibrosis. Recommend multi disciplinary referral on a nonemergent basis for consideration of PET metabolic characterization and tissue sampling. 5. There are enlarged bilateral hilar, mediastinal, and prevascular lymph nodes, nonspecific and possibly reactive in the setting of fibrosis. 6. Coronary artery disease. Aortic Atherosclerosis (ICD10-I70.0). Electronically Signed   By: Eddie Candle M.D.   On: 10/12/2020 16:05   DG Chest Port 1 View  Result Date: 10/22/2020 CLINICAL DATA:  65 year old female with  shortness of breath status post COVID-19 1 month ago. Pulmonary fibrosis suspected on CTA. Respiratory failure. EXAM: PORTABLE CHEST 1 VIEW COMPARISON:  Portable chest 10/21/2020 and earlier. FINDINGS: Stable lung volumes and mediastinal contours. Asymmetric coarse pulmonary interstitial opacity is stable from yesterday, and remains mildly increased in the right upper lobe compared to 10/12/2020. Spiculated left upper lung nodule remains occult by x-ray. No pneumothorax, pleural effusion or areas of worsening ventilation. Visualized tracheal air column is within normal limits. Stable visualized osseous structures. IMPRESSION: 1. Suspected  pulmonary fibrosis with continued increased reticulonodular opacity in the right upper lobe compared to earlier this month, possibly acute infectious exacerbation. 2. Stable since yesterday. Electronically Signed   By: Genevie Ann M.D.   On: 10/22/2020 05:42   DG Chest Port 1 View  Result Date: 10/21/2020 CLINICAL DATA:  65 year old female with shortness of breath. Status post COVID-19. 1 month ago. Suspicious left upper lobe pulmonary nodule on chest CT earlier this month. EXAM: PORTABLE CHEST 1 VIEW COMPARISON:  Chest CTA 10/12/2020 and earlier. FINDINGS: Portable AP upright view at 0559 hours. Stable lung volumes and mediastinal contours. Coarse reticulonodular opacity persists throughout both lungs, most pronounced at the left lung base. There has been mild progression of opacity in the right upper lobe from earlier this month. No superimposed pneumothorax, pleural effusion or consolidation. Visualized tracheal air column is within normal limits. No acute osseous abnormality identified. IMPRESSION: Evidence of pulmonary fibrosis as suspected on the CTA earlier this month with interval increased reticulonodular opacity in the right upper lobe favored to represent acute infectious exacerbation. Otherwise stable; spiculated left upper lobe lung nodule not visible radiographically.  Electronically Signed   By: Genevie Ann M.D.   On: 10/21/2020 06:30   DG Chest Port 1 View  Result Date: 10/12/2020 CLINICAL DATA:  Shortness of breath. Cough. COVID positive 09/21/2020 EXAM: PORTABLE CHEST 1 VIEW COMPARISON:  10/11/2020 FINDINGS: Midline trachea. Mild cardiomegaly, accentuated by AP portable technique. No pleural effusion or pneumothorax. Left greater than right lower lobe predominant interstitial opacities are similar. IMPRESSION: Similar left greater than right lower lobe predominant interstitial opacities. Given the clinical history, likely related to COVID-19 pneumonia or developing post infectious fibrosis. As findings are new compared to 05/05/2018, non acute etiologies such as interval interstitial lung disease could look similar. Electronically Signed   By: Abigail Miyamoto M.D.   On: 10/12/2020 15:01   Micro Results  No results found for this or any previous visit (from the past 240 hour(s)).     Today   Subjective    [name] today has dyspnea and persistent hypoxia  Discharge home with hospice services and full comfort care measures and home oxygen   Patient has been seen and examined prior to discharge   Objective   Blood pressure (!) 119/47, pulse 82, temperature (!) 97.5 F (36.4 C), temperature source Oral, resp. rate 20, height 5\' 6"  (1.676 m), weight 106.6 kg, SpO2 91 %.   Intake/Output Summary (Last 24 hours) at 10/26/2020 1647 Last data filed at 10/26/2020 1300 Gross per 24 hour  Intake 720 ml  Output 800 ml  Net -80 ml    Exam Gen:- Awake Alert, no acute distress, dyspnea with positional change in bed persist HEENT:- [place].AT, No sclera icterus Nose- NRB + HFNC  Neck-Supple Neck,No JVD,.  Lungs-diminished breath sounds, scattered rhonchi CV- S1, S2 normal, regular Abd-  +ve B.Sounds, Abd Soft, No tenderness,    Extremity/Skin:- No  edema,   good pulses Psych-affect is appropriate, oriented x3 Neuro-generalized weakness, no new focal  deficits, no tremors    Data Review   CBC w Diff:  Lab Results  Component Value Date   WBC 23.3 (H) 10/26/2020   HGB 13.0 10/26/2020   HGB 13.0 10/16/2020   HCT 43.6 10/26/2020   HCT 40.3 10/16/2020   PLT 440 (H) 10/26/2020   PLT 447 10/16/2020   LYMPHOPCT 4 10/26/2020   MONOPCT 7 10/26/2020   EOSPCT 0 10/26/2020   BASOPCT 0 10/26/2020  CMP:  Lab Results  Component Value Date   NA 139 10/26/2020   NA 142 10/16/2020   K 4.7 10/26/2020   CL 104 10/26/2020   CO2 25 10/26/2020   BUN 27 (H) 10/26/2020   BUN 18 10/16/2020   CREATININE 0.64 10/26/2020   CREATININE 1.01 11/16/2013   PROT 6.5 10/26/2020   PROT 7.3 10/16/2020   ALBUMIN 2.2 (L) 10/26/2020   ALBUMIN 3.4 (L) 10/16/2020   BILITOT 0.4 10/26/2020   BILITOT 0.3 10/16/2020   ALKPHOS 129 (H) 10/26/2020   AST 56 (H) 10/26/2020   ALT 109 (H) 10/26/2020  .   Total Discharge time is about 33 minutes  Roxan Hockey M.D on 10/26/2020 at 4:47 PM  Go to [email or web address] -  for contact info  [facility] - Office  [phone]

## 2020-10-26 NOTE — Progress Notes (Addendum)
Daily Progress Note   Patient Name: [name]       Date: 10/26/2020 DOB: [birth year]  Age: 65 y.o. MRN#: [id number] Attending Physician: Roxan Hockey, MD Primary Care Physician: Kathyrn Drown, MD Admit Date: 10/21/2020  Reason for Consultation/Follow-up: Establishing goals of care  Subjective: Patient awake, alert, oriented. Still requiring 15L HFNC and 15L NRB mask. Oxygen saturations drop quickly with exertion. Reports relief from prn xanax and Roxanol.   GOC:  [name] just wants to go home. She understands her condition and prognosis and prepared that she may decline quickly once home with less oxygen. She just wishes to be with her family, friends, and dog. She is tired and ready. She has prepared her husband, family, and friends of her condition, prognosis, and plan for home hospice. Discussed symptom management medications. She does have relief from prn xanax and Roxanol. [name] is agreeable with adding fentanyl TD for dyspnea. She spoke with hospice RN this morning and is hopeful for discharge home today. Support provided. Encouraged Ann to call PMT number with needs this afternoon.   Discussed with bedside RN and hospice RN. Needs ambulance transfer home and same day hospice admission.   ADDENDUM: Patient unable to discharge home today. Issues with outlets in the home and hooking up oxygen. Family to call an electrician with hopes of the issue being resolved tonight. Discussed with patient, RN, respiratory therapy, Dr. Denton Brick. Will add low dose scheduled Roxanol. Explained to patient that IV ativan and IV morphine will also be available on MAR prn for symptoms through the night.   Length of Stay: 5  Current Medications: Scheduled Meds:  . ascorbic acid  500 mg Oral Daily  .  atorvastatin  20 mg Oral QPM  . clopidogrel  75 mg Oral Daily  . enoxaparin (LOVENOX) injection  40 mg Subcutaneous Q24H  . fentaNYL  1 patch Transdermal Q72H  . FLUoxetine  60 mg Oral Daily  . insulin aspart  0-15 Units Subcutaneous TID WC  . insulin aspart  4 Units Subcutaneous TID WC  . ipratropium-albuterol  3 mL Nebulization QID  . isosorbide mononitrate  15 mg Oral Daily  . levothyroxine  125 mcg Oral Once per day on Mon Tue Wed Thu Fri Sat  . levothyroxine  62.5 mcg Oral Q Sun  . methylPREDNISolone (  SOLU-MEDROL) injection  80 mg Intravenous Q6H  . metoprolol succinate  50 mg Oral Daily  . nicotine  21 mg Transdermal Daily  . pantoprazole  40 mg Oral Daily  . polyethylene glycol  17 g Oral Daily  . saccharomyces boulardii  250 mg Oral BID  . zinc sulfate  220 mg Oral Daily    Continuous Infusions:   PRN Meds: acetaminophen, ALPRAZolam, amitriptyline, bisacodyl, guaiFENesin-dextromethorphan, HYDROcodone-acetaminophen, ipratropium-albuterol, morphine CONCENTRATE, nitroGLYCERIN  Physical Exam Vitals and nursing note reviewed.  Constitutional:      General: She is awake.  Cardiovascular:     Rate and Rhythm: Normal rate.  Pulmonary:     Effort: No tachypnea, accessory muscle usage or respiratory distress.     Comments: Dyspnea with exertion. Requiring 15L HFNC and 15L NRB mask.  Skin:    General: Skin is warm and dry.  Neurological:     Mental Status: She is alert and oriented to person, place, and time.  Psychiatric:        Mood and Affect: Mood normal.        Speech: Speech normal.        Behavior: Behavior normal.        Cognition and Memory: Cognition normal.            Vital Signs: BP 125/72 (BP Location: Left Arm)   Pulse 69   Temp (!) 97.5 F (36.4 C) (Oral)   Resp 18   Ht 5\' 6"  (1.676 m)   Wt 106.6 kg   SpO2 92%   BMI 37.93 kg/m  SpO2: SpO2: 92 % O2 Device: O2 Device: High Flow Nasal Cannula,NRB O2 Flow Rate: O2 Flow Rate (L/min): 10  L/min  Intake/output summary:   Intake/Output Summary (Last 24 hours) at 10/26/2020 1042 Last data filed at 10/26/2020 0900 Gross per 24 hour  Intake 720 ml  Output 800 ml  Net -80 ml   LBM: Last BM Date: 10/20/20 Baseline Weight: Weight: 106.6 kg Most recent weight: Weight: 106.6 kg       Palliative Assessment/Data: PPS 50%    Flowsheet Rows   Flowsheet Row Most Recent Value  Intake Tab   Referral Department Hospitalist  Unit at Time of Referral Cardiac/Telemetry Unit  Palliative Care Primary Diagnosis Pulmonary  Palliative Care Type New Palliative care  Reason for referral Clarify Goals of Care  Date first seen by Palliative Care 10/23/20  Clinical Assessment   Palliative Performance Scale Score 50%  Psychosocial & Spiritual Assessment   Palliative Care Outcomes   Patient/Family meeting held? Yes  Who was at the meeting? patient  Palliative Care Outcomes Clarified goals of care, Provided psychosocial or spiritual support, ACP counseling assistance      Patient Active Problem List   Diagnosis Date Noted  . HCAP (healthcare-associated pneumonia)   . Palliative care by specialist   . Acute and chronic respiratory failure with hypoxia ([place]) 10/21/2020  . Sleep apnea   . Pulmonary fibrosis ([place])   . Leukocytosis   . Mass of upper lobe of left lung 10/17/2020  . Secondary cardiomyopathy ([place]) 10/15/2020  . Acute respiratory failure with hypoxia ([place]) 10/12/2020  . Lung mass 10/12/2020  . Dyspnea on exertion 09/21/2020  . Prediabetes 07/17/2020  . Fatigue 07/17/2020  . Need for vaccination 06/27/2020  . Breast abscess of female 06/15/2020  . Bronchitis with wheezing 06/15/2020  . Unstable angina ([place]) 10/21/2017  . Goals of care, counseling/discussion 10/09/2017  . B12 deficiency 03/17/2017  . Bradycardia   .  NSTEMI (non-ST elevated myocardial infarction) ([place]) 11/21/2015  . Chest pain on exertion 11/20/2015  . Cervical myelopathy ([place]) 09/04/2015  . Morbid  obesity ([place]) 09/12/2014  . External hemorrhoid 04/30/2014  . Irritable bowel syndrome 04/30/2014  . Fatty liver disease, nonalcoholic 15/17/6160  . Impaired fasting glucose 11/18/2013  . Anxiety and depression 11/10/2013  . GERD (gastroesophageal reflux disease) 11/10/2013  . Scalp psoriasis 12/29/2012  . Fibromyalgia 12/29/2012  . Essential hypertension 02/08/2010  . Hypothyroidism 03/08/2009  . Mixed hyperlipidemia 03/08/2009  . CORONARY ATHEROSCLEROSIS NATIVE CORONARY ARTERY 03/08/2009    Palliative Care Assessment & Plan   Patient Profile: 65 y.o.femalewith medical history significantfor COPD and pulmonary fibrosis from 52 years of smoking (stopped 2/22), newly discovered lung mass likely primary lung cancer, CAD s/p MI with stenting, GERD, Depression, Fibromyalgia, morbid obesity, HTN, prediabetes, OSA, hypothyroidism, hyperlipidemia, GAD, arthritis presented to ED with increasing shortness of breath symptoms. Pt recovering from Covid infection diagnosed 7/37/10 complicated by pneumonia. She reports that she has been working with her primary care physician and was started on supplemental oxygen recently due to having hypoxia at home with a pulse ox in the 70s. Hospital admission for acute on chronic respiratory failure due to HCAP, pulmonary fibrosis, and new lung mass, currently requiring HFNC 13L. OSA requiring HS CPAP. Patient is established with oncology Dr. Delton Coombes with plans for PET scan, brain MRI, and ? Bronch. Patient with higher oxygen requirements since hospital discharge on 10/12/20. Pending pulmonary consultation. Palliative medicine consultation for goals of care.   Assessment: Acute on chronic hypoxic respiratory failure Pulmonary fibrosis, post-COVID fibrosis Left upper lobe lung mass Obesity/OSA CAD Hx of tobacco abuse, recently quit DM type 2 Depression Anxiety  Recommendations/Plan:  DNR/DNI, no escalation of care.  Following conversations with Dr.  Melvyn Novas and Dr. Denton Brick, patient understands her condition and poor prognosis. She wants nothing more than to return home with hospice support and spend her final time with family and friends.   Coordinating with TOC and [facility] agency to get her home with hospice support. Will need 10L oxygen concentrator and ongoing NRB. Also recommend non-emergent ambulance transfer home and same day home hospice admission once discharged. Hopefully home with hospice today 3/25 if equipment delivered.  Continue prn xanax and Roxanol for anxiety/dyspnea/air hunger. Added Fentanyl TD 27mcg for dyspnea.  Code Status: DNR   Code Status Orders  (From admission, onward)         Start     Ordered   10/21/20 0747  Do not attempt resuscitation (DNR)  Continuous       Question Answer Comment  In the event of cardiac or respiratory ARREST Do not call a "code blue"   In the event of cardiac or respiratory ARREST Do not perform Intubation, CPR, defibrillation or ACLS   In the event of cardiac or respiratory ARREST Use medication by any route, position, wound care, and other measures to relive pain and suffering. May use oxygen, suction and manual treatment of airway obstruction as needed for comfort.      10/21/20 0755        Code Status History    Date Active Date Inactive Code Status Order ID Comments User Context   10/12/2020 2247 10/13/2020 2131 DNR [id number]  Truett Mainland, DO Inpatient   10/21/2017 1748 10/22/2017 1611 Full Code [id number]  Wellington Hampshire, MD Inpatient   10/21/2017 1748 10/21/2017 1748 Full Code [id number]  Erma Heritage, PA-C Inpatient   11/20/2015 2115 11/22/2015  1659 Full Code [id number]  Phillips Grout, MD Inpatient   Advance Care Planning Activity       Prognosis:  Very tenuous with acute respiratory failure following COVID-19 pneumonia, now covid fibrosis. Possible lung neoplasm.   Discharge Planning:  Home with Hospice  Care plan was discussed with Dr.  Denton Brick, patient, RN, hospice liaison, respiratory therapy  Thank you for allowing the Palliative Medicine Team to assist in the care of this patient.   Total Time 45 Prolonged Time Billed no      Greater than 50% of this time was spent counseling and coordinating care related to the above assessment and plan.   Ihor Dow, DNP, FNP-C Palliative Medicine Team  Phone: [phone] Fax: [phone]  Please contact Palliative Medicine Team phone at [phone] for questions and concerns.

## 2020-10-26 NOTE — TOC Transition Note (Signed)
Transition of Care [facility]) - CM/SW Discharge Note   Patient Details  Name: RYLIEE FIGGE MRN: [id number] Date of Birth: 11/09/1955  [facility]) CM/SW Contact:  Natasha Bence, LCSW Phone Number: 10/26/2020, 4:30 PM   Clinical Narrative:    CSW notified that family was able to transfer patient's belongings to the [facility] for set up of DME equipment. Hospice nurse notified. [facility] agreeable to see patient on 03/25 upon discharge. TOC signing off.    Final next level of care: Home w Hospice Care Barriers to Discharge: Barriers Resolved   Patient Goals and CMS Choice   CMS Medicare.gov Compare Post Acute Care list provided to:: Patient Choice offered to / list presented to : Patient  Discharge Placement                Patient to be transferred to facility by: [facility] Name of family member notified: Maia Breslow (Sister)   [phone] Patient and family notified of of transfer: 10/26/20  Discharge Plan and Services                DME Arranged: N/A DME Agency: NA       HH Arranged: NA HH Agency: NA        Social Determinants of Health (SDOH) Interventions     Readmission Risk Interventions No flowsheet data found.

## 2020-10-26 NOTE — Discharge Instructions (Signed)
Discharge home with hospice services and full comfort care measures and home oxygen

## 2020-10-26 NOTE — Progress Notes (Signed)
Patient just left with EMS to go home. It was verified that  Her oxygen was home. Patient was placed on nonbreather mask. She was having trouble breathing. It was offered to her to wait for a CLS truck but she refused stating that she just wanted to get home .

## 2020-10-26 NOTE — TOC Progression Note (Signed)
Transition of Care [facility]) - Progression Note    Patient Details  Name: [name] MRN: [id number] Date of Birth: 01-26-1956  Transition of Care [facility] CM/SW Contact  Natasha Bence, LCSW Phone Number: 10/26/2020, 3:35 PM  Clinical Narrative:    CSW notified by Cassandra with [facility] that patient's home did not have any working outlets due to renovation. Hospice is not able to set up equipment without outlets. Per Vito Backers, family reported that they will have an electrician come to the home to set up an outlet TOC to follow.   Expected Discharge Plan: Home w Hospice Care Barriers to Discharge: No Barriers Identified  Expected Discharge Plan and Services Expected Discharge Plan: [place]         Expected Discharge Date: 10/26/20                                     Social Determinants of Health (SDOH) Interventions    Readmission Risk Interventions No flowsheet data found.

## 2020-10-29 ENCOUNTER — Other Ambulatory Visit (HOSPITAL_COMMUNITY): Payer: Medicare HMO

## 2020-10-29 ENCOUNTER — Ambulatory Visit: Payer: Medicare HMO | Admitting: Family Medicine

## 2020-10-31 ENCOUNTER — Other Ambulatory Visit (HOSPITAL_COMMUNITY): Payer: Medicare HMO

## 2020-11-02 DEATH — deceased

## 2020-11-05 ENCOUNTER — Ambulatory Visit (HOSPITAL_COMMUNITY): Payer: Medicare HMO | Admitting: Hematology

## 2020-11-07 ENCOUNTER — Ambulatory Visit: Payer: Medicare HMO | Admitting: Family Medicine

## 2020-11-21 ENCOUNTER — Ambulatory Visit: Payer: Medicare HMO | Admitting: Adult Health

## 2021-03-16 IMAGING — DX DG CHEST 1V PORT
1 series · 1 of 1 positions shown · non-contrast
Comparison: 10/11/2020

CLINICAL DATA: Shortness of breath. Cough. COVID positive
09/21/2020

EXAM:
PORTABLE CHEST 1 VIEW

[chest ap]
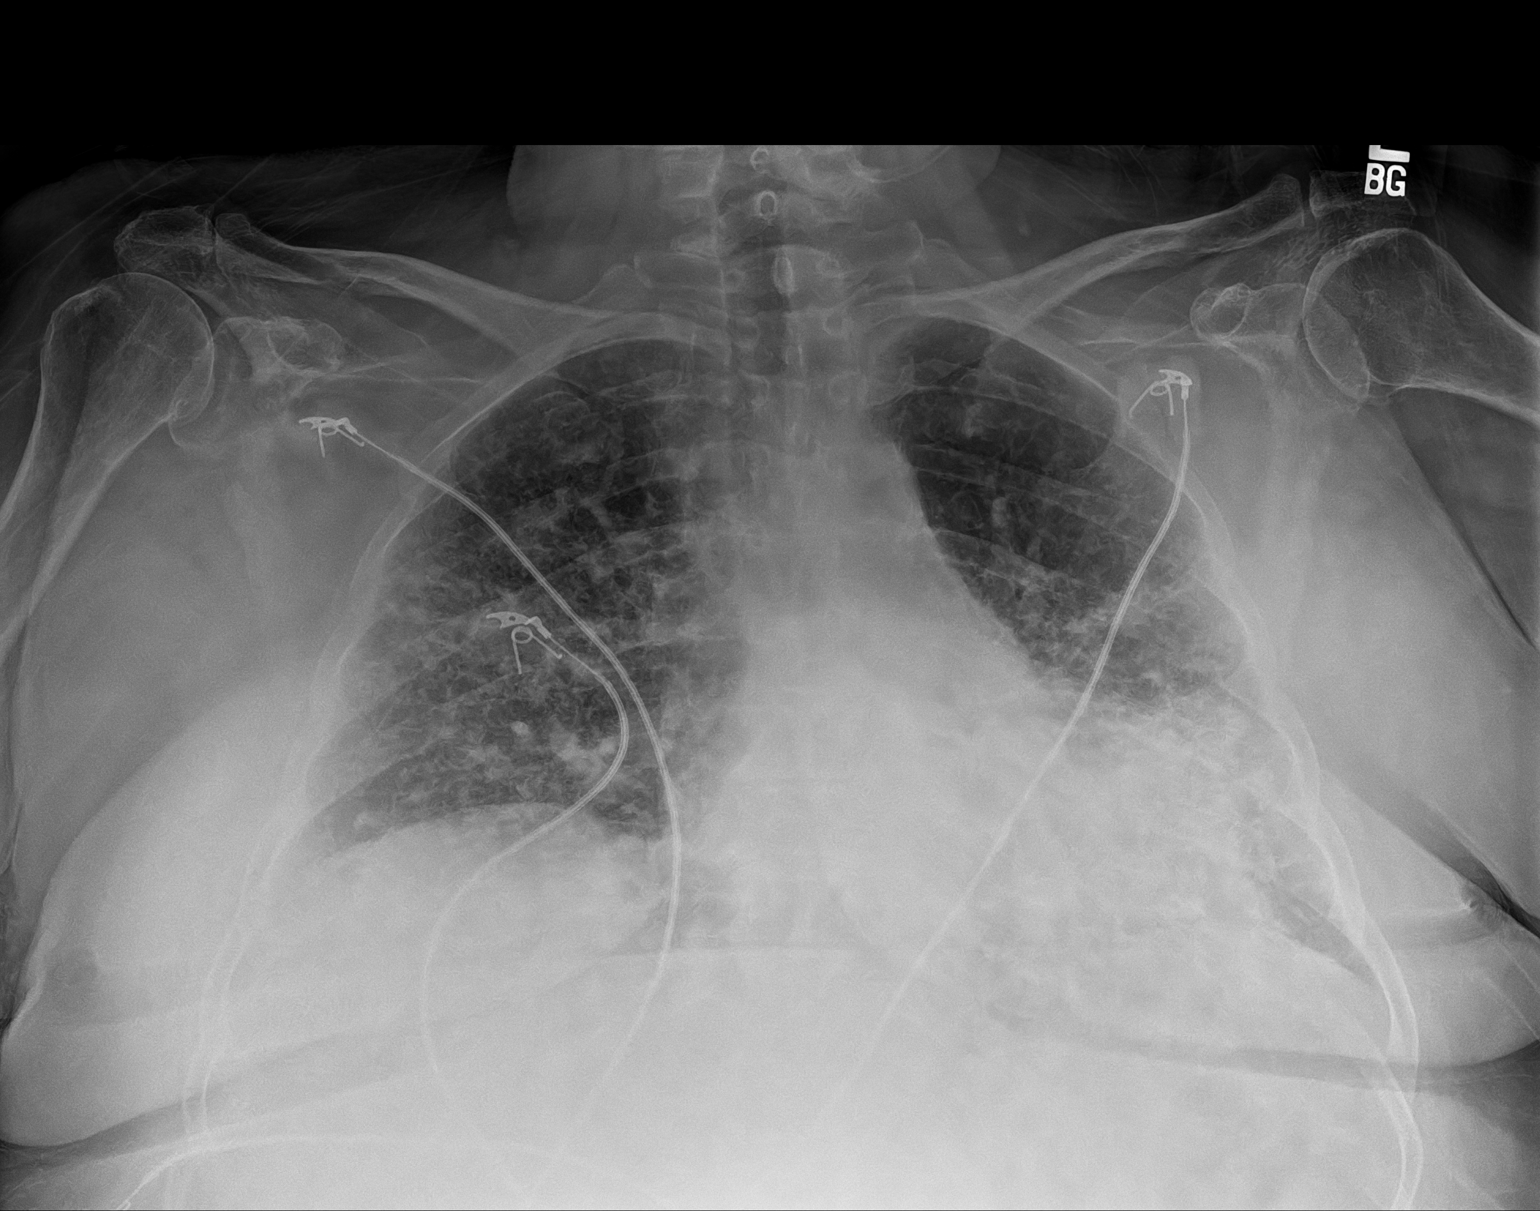

[1 of 1 positions shown; findings below may reference images not displayed]

FINDINGS: Midline trachea. Mild cardiomegaly, accentuated by AP portable
technique. No pleural effusion or pneumothorax. Left greater than
right lower lobe predominant interstitial opacities are similar.
IMPRESSION: Similar left greater than right lower lobe predominant interstitial
opacities. Given the clinical history, likely related to [id number]
pneumonia or developing post infectious fibrosis. As findings are
new compared to 05/05/2018, non acute etiologies such as interval
interstitial lung disease could look similar.
